# Patient Record
Sex: Male | Born: 1970
Health system: Southern US, Community
[De-identification: ages and names within clinical notes are randomized; demographics above are authoritative.]

## PROBLEM LIST (undated history)

## (undated) DIAGNOSIS — Z87440 Personal history of urinary (tract) infections: Secondary | ICD-10-CM

## (undated) DIAGNOSIS — D649 Anemia, unspecified: Secondary | ICD-10-CM

## (undated) DIAGNOSIS — I82409 Acute embolism and thrombosis of unspecified deep veins of unspecified lower extremity: Secondary | ICD-10-CM

## (undated) DIAGNOSIS — G8220 Paraplegia, unspecified: Secondary | ICD-10-CM

## (undated) DIAGNOSIS — G8250 Quadriplegia, unspecified: Secondary | ICD-10-CM

## (undated) DIAGNOSIS — N529 Male erectile dysfunction, unspecified: Secondary | ICD-10-CM

## (undated) DIAGNOSIS — R310 Gross hematuria: Secondary | ICD-10-CM

## (undated) DIAGNOSIS — Z87828 Personal history of other (healed) physical injury and trauma: Secondary | ICD-10-CM

## (undated) HISTORY — DX: Male erectile dysfunction, unspecified: N52.9

## (undated) HISTORY — DX: Anemia, unspecified: D64.9

## (undated) HISTORY — DX: Personal history of urinary (tract) infections: Z87.440

## (undated) HISTORY — DX: Paraplegia, unspecified: G82.20

## (undated) HISTORY — DX: Acute embolism and thrombosis of unspecified deep veins of unspecified lower extremity: I82.409

---
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
## 1997-12-05 ENCOUNTER — Encounter: Admission: RE | Admit: 1997-12-05 | Discharge: 1997-12-05 | Payer: Self-pay | Admitting: Sports Medicine

## 1999-01-23 ENCOUNTER — Encounter: Admission: RE | Admit: 1999-01-23 | Discharge: 1999-01-23 | Payer: Self-pay | Admitting: Family Medicine

## 1999-02-08 ENCOUNTER — Encounter: Admission: RE | Admit: 1999-02-08 | Discharge: 1999-02-08 | Payer: Self-pay | Admitting: Family Medicine

## 2000-09-22 ENCOUNTER — Encounter: Admission: RE | Admit: 2000-09-22 | Discharge: 2000-09-22 | Payer: Self-pay | Admitting: Family Medicine

## 2001-02-19 ENCOUNTER — Encounter: Admission: RE | Admit: 2001-02-19 | Discharge: 2001-02-19 | Payer: Self-pay | Admitting: Family Medicine

## 2002-01-31 ENCOUNTER — Encounter: Admission: RE | Admit: 2002-01-31 | Discharge: 2002-01-31 | Payer: Self-pay | Admitting: Urology

## 2002-01-31 ENCOUNTER — Encounter: Payer: Self-pay | Admitting: Urology

## 2003-04-19 ENCOUNTER — Encounter: Admission: RE | Admit: 2003-04-19 | Discharge: 2003-04-19 | Payer: Self-pay | Admitting: Family Medicine

## 2004-06-03 ENCOUNTER — Ambulatory Visit: Payer: Self-pay | Admitting: Family Medicine

## 2005-06-30 ENCOUNTER — Ambulatory Visit: Payer: Self-pay | Admitting: Family Medicine

## 2005-07-07 LAB — CONVERTED CEMR LAB
HDL: 52 mg/dL
HDL: 52 mg/dL
HDL: 52 mg/dL
HDL: 52 mg/dL
HDL: 52 mg/dL
LDL Cholesterol: 130 mg/dL
LDL Cholesterol: 130 mg/dL
LDL Cholesterol: 130 mg/dL
LDL Cholesterol: 130 mg/dL
LDL Cholesterol: 130 mg/dL

## 2006-08-19 ENCOUNTER — Ambulatory Visit: Payer: Self-pay | Admitting: Sports Medicine

## 2006-09-15 ENCOUNTER — Telehealth (INDEPENDENT_AMBULATORY_CARE_PROVIDER_SITE_OTHER): Payer: Self-pay | Admitting: *Deleted

## 2007-07-15 ENCOUNTER — Telehealth (INDEPENDENT_AMBULATORY_CARE_PROVIDER_SITE_OTHER): Payer: Self-pay | Admitting: *Deleted

## 2007-07-29 ENCOUNTER — Ambulatory Visit: Payer: Self-pay | Admitting: Family Medicine

## 2007-07-29 DIAGNOSIS — S129XXA Fracture of neck, unspecified, initial encounter: Secondary | ICD-10-CM

## 2007-07-29 DIAGNOSIS — R319 Hematuria, unspecified: Secondary | ICD-10-CM

## 2009-05-08 ENCOUNTER — Encounter: Payer: Self-pay | Admitting: Family Medicine

## 2009-05-10 ENCOUNTER — Ambulatory Visit: Payer: Self-pay | Admitting: Family Medicine

## 2009-05-10 DIAGNOSIS — R532 Functional quadriplegia: Secondary | ICD-10-CM

## 2009-05-18 ENCOUNTER — Encounter: Payer: Self-pay | Admitting: Family Medicine

## 2010-04-09 NOTE — Consult Note (Signed)
Summary: [facility] (05/2008 and 10/2006)  [facility]   Imported By: Bradly Bienenstock 05/22/2009 14:31:50  _____________________________________________________________________  External Attachment:    Type:   Image     Comment:   External Document

## 2010-04-09 NOTE — Assessment & Plan Note (Signed)
Summary: meet new doc.   Vital Signs:  Patient profile:   40 year old male Weight:      169.9 pounds Pulse rate:   80 / minute BP sitting:   125 / 73  Vitals Entered By: Arlyss Repress CMA, (May 10, 2009 10:32 AM) CC: needs handicap placard. Is Patient Diabetic? No Pain Assessment Patient in pain? no        Primary Care Provider:  Eustaquio Boyden  MD  CC:  needs handicap placard..  History of Present Illness: CC: meet new doc.  h/o neck fracture 1988 after fall down stairs, since then ambulates with crutches.  has residual quadriparesis (greater below  ~T8).  o/w no complaints.  sees Dr. McDiarmid Urology and currently on macrobid daily for prophylaxis.  to sign ROI for records.  Habits & Providers  Alcohol-Tobacco-Diet     Alcohol drinks/day: 0     Tobacco Status: quit > 6 months  Exercise-Depression-Behavior     Drug Use: never     Seat Belt Use: always  Current Medications (verified): 1)  Baclofen 10 Mg Tabs (Baclofen) .... Take 1 Tablet By Mouth Three Times A Day 2)  Nitrofurantoin Macrocrystal 50 Mg Caps (Nitrofurantoin Macrocrystal) .... Two Daily For Prevention of Kidney Infections  Allergies (verified): 1)  ! Pcn  Past History:  Past Surgical History: none  Family History: MOM had him at 54 father 35. Has 1 brother 1 sister no sig. family hx. of disease No CA, MI, CVA  Social History: Smoking Status:  quit > 6 months Drug Use:  never Seat Belt Use:  always  Physical Exam  General:  Well-developed,well-nourished,in no acute distress; alert,appropriate and cooperative throughout examination Lungs:  Normal respiratory effort, chest expands symmetrically. Lungs are clear to auscultation, no crackles or wheezes. Heart:  Normal rate and regular rhythm. S1 and S2 normal without gallop, murmur, click, rub or other extra sounds.   Impression & Recommendations:  Problem # 1:  Hx of VERTEBRAL FRACTURE, CERVICAL SPINE (ICD-805.00)  refilled  baclofen.  doing well on this.  able to ambulate with crutches.  Orders: [facility]- Est Level  3 (16109)  Problem # 2:  HEMATURIA (ICD-599.70) obtain records from urology.  His updated medication list for this problem includes:    Nitrofurantoin Macrocrystal 50 Mg Caps (Nitrofurantoin macrocrystal) .Marland Kitchen..Marland Kitchen Two daily for prevention of kidney infections  Problem # 3:  Preventive Health Care (ICD-V70.0) needs tetanus 2012.  needs FLP possibly 2012 (last 2007 with LDL 130)  Complete Medication List: 1)  Baclofen 10 Mg Tabs (Baclofen) .... Take 1 tablet by mouth three times a day 2)  Nitrofurantoin Macrocrystal 50 Mg Caps (Nitrofurantoin macrocrystal) .... Two daily for prevention of kidney infections  Patient Instructions: 1)  Pleasure to meet you today. 2)  Please return as needed. 3)  I have refilled your baclofen for 1 more year.  4)  I have filled out your application for handicap placard. 5)  Call clinic with questions. Prescriptions: BACLOFEN 10 MG TABS (BACLOFEN) Take 1 tablet by mouth three times a day  #90 x 11   Entered and Authorized by:   Eustaquio Boyden  MD   Signed by:   Eustaquio Boyden  MD on 05/10/2009   Method used:   Electronically to        [facility] (retail)       [address]       [place], [place]  [id number]  Ph: [phone]       Fax: [phone]   RxID:   [id number]    Prevention & Chronic Care Immunizations   Influenza vaccine: Not documented    Tetanus booster: 02/07/2001: Done.   Tetanus booster due: 02/08/2011    Pneumococcal vaccine: Not documented  Other Screening   Smoking status: quit > 6 months  (05/10/2009)  Lipids   Total Cholesterol: Not documented   LDL: 130  (07/07/2005)   LDL Direct: Not documented   HDL: 52  (07/07/2005)   Triglycerides: Not documented

## 2010-04-09 NOTE — Miscellaneous (Signed)
Summary: Urology update  Clinical Lists Changes  Medications: Changed medication from NITROFURANTOIN MACROCRYSTAL 50 MG CAPS (NITROFURANTOIN MACROCRYSTAL) two daily for prevention of kidney infections to NITROFURANTOIN MACROCRYSTAL 100 MG CAPS (NITROFURANTOIN MACROCRYSTAL) take one daily for prevention of UTIs Observations: Added new observation of UROLOGY MD: MacDiarmid (05/18/2009 11:49) Added new observation of PAST MED HX: cervical fracture/fusion C4-6 after accident with residual paraparesis recurrent UTIs (on ppx macrodantin) with neurogenic bladder Erectile Dysfunction, organic (05/18/2009 11:49)      Habits & Providers     Urologist: MacDiarmid   Past History:  Past Medical History: cervical fracture/fusion C4-6 after accident with residual paraparesis recurrent UTIs (on ppx macrodantin) with neurogenic bladder Erectile Dysfunction, organic

## 2010-04-15 ENCOUNTER — Encounter: Payer: Self-pay | Admitting: *Deleted

## 2010-05-10 ENCOUNTER — Encounter: Payer: Self-pay | Admitting: Family Medicine

## 2010-05-10 ENCOUNTER — Ambulatory Visit (INDEPENDENT_AMBULATORY_CARE_PROVIDER_SITE_OTHER): Payer: Medicaid Other | Admitting: Family Medicine

## 2010-05-10 VITALS — BP 115/70 | Temp 98.4°F

## 2010-05-10 DIAGNOSIS — M79609 Pain in unspecified limb: Secondary | ICD-10-CM

## 2010-05-10 DIAGNOSIS — Z79899 Other long term (current) drug therapy: Secondary | ICD-10-CM

## 2010-05-10 DIAGNOSIS — M79604 Pain in right leg: Secondary | ICD-10-CM

## 2010-05-10 DIAGNOSIS — Z87440 Personal history of urinary (tract) infections: Secondary | ICD-10-CM

## 2010-05-10 MED ORDER — MELOXICAM 15 MG PO TABS: 15.0000 mg | ORAL_TABLET | Freq: Every day | ORAL | Status: AC

## 2010-05-10 MED ORDER — BACLOFEN 10 MG PO TABS: 10.0000 mg | ORAL_TABLET | Freq: Three times a day (TID) | ORAL | Status: DC

## 2010-05-10 NOTE — Patient Instructions (Addendum)
It was nice meeting you today.  Your repeat Blood pressure was 115/70 which is a good healthy number.  I have sent your prescription for baclofen to [facility].  For your pain I have sent a medication that is similar to ibuprofen to your pharmacy, take this with food.  If you start to have upset stomach with this let me know.  Do not take ibuprofen (advil, motrin) or aleve with this medicine.  Follow up with Dr. Perley Jain in urology to have your macrodantin refilled since he has been filling this.  If you have questions please feel free to call our office.

## 2010-05-19 ENCOUNTER — Encounter: Payer: Self-pay | Admitting: Family Medicine

## 2010-05-19 DIAGNOSIS — Z87440 Personal history of urinary (tract) infections: Secondary | ICD-10-CM | POA: Insufficient documentation

## 2010-05-19 NOTE — Assessment & Plan Note (Addendum)
Has been using ibuprofen, would like a medication similar that he does not have to take often.  WIll Rx meloxicam for now to see if this improves.  No chemistry on file, will obtain to check Cr since has been using nsaid

## 2010-05-19 NOTE — Assessment & Plan Note (Signed)
Doing well, will refill baclofen for spasm control/relief

## 2010-05-19 NOTE — Progress Notes (Signed)
  Subjective:    Patient ID: Andrew Sullivan, male    DOB: 07-16-1970, 40 y.o.   MRN: [id number]  HPI Andrew Sullivan is a new patient to me who comes in for medication refills.  He states since his last visit he has had a an increase in the pain in his legs.  He is a paraplegic but does have feeling in his legs bilaterally. He states the pain is a dull ache that comes and goes down his entire leg.  He has been taking Ibuprofen for the pain and that has been helping.   Denies pain feeling like electric shock, denies any lower back pain, increased difficulty with urination or bowel movements.  Wants something similar to ibuprofen that he does not have to take as often.   Review of Systems Denies n/v, sob, cp, headache, pain in other joints or muscles, swelling of extremities.       Objective:   Physical Exam  Vitals reviewed. Constitutional: He appears well-developed and well-nourished.  HENT:  Head: Normocephalic and atraumatic.  Eyes: Pupils are equal, round, and reactive to light.  Neck: Neck supple.  Cardiovascular: Normal rate and regular rhythm.   Pulmonary/Chest: Effort normal and breath sounds normal.  Abdominal: Soft. Bowel sounds are normal. He exhibits no distension. There is no tenderness.  Musculoskeletal:       Right upper leg: He exhibits no tenderness, no swelling and no edema.       Left upper leg: He exhibits no tenderness, no swelling and no edema.       Right lower leg: He exhibits no tenderness, no swelling and no edema.       Left lower leg: He exhibits no tenderness, no swelling and no edema.       Paraplegia  Lymphadenopathy:    He has no cervical adenopathy.  Skin: Skin is warm and dry.

## 2010-05-19 NOTE — Assessment & Plan Note (Signed)
On daily macrobid, rx from dr mcdiarmid in urology.  Pt. States he has appt. With him within the next couple of weeks, will defer to him to continue to refill macrobid.

## 2010-05-20 ENCOUNTER — Encounter: Payer: Self-pay | Admitting: Family Medicine

## 2010-06-03 ENCOUNTER — Telehealth: Payer: Self-pay | Admitting: *Deleted

## 2010-06-03 MED ORDER — BACLOFEN 10 MG PO TABS: 10.0000 mg | ORAL_TABLET | Freq: Three times a day (TID) | ORAL | Status: DC

## 2010-06-03 NOTE — Telephone Encounter (Signed)
Resent Rx for #90 w/ 6 refills

## 2010-06-03 NOTE — Telephone Encounter (Signed)
Received call from pharmacy stating they received Rx for baclofen 10 mg with directions to take one three times a day. However RX was for only #30 tabs. Advised pharmacy may give patient # 90 tabs, will send message to Dr. Ashley Royalty.

## 2010-06-05 ENCOUNTER — Encounter: Payer: Self-pay | Admitting: Family Medicine

## 2010-06-05 ENCOUNTER — Ambulatory Visit (INDEPENDENT_AMBULATORY_CARE_PROVIDER_SITE_OTHER): Payer: Medicaid Other | Admitting: Family Medicine

## 2010-06-05 VITALS — BP 137/72 | HR 87 | Temp 98.2°F | Ht 75.0 in | Wt 161.0 lb

## 2010-06-05 DIAGNOSIS — L259 Unspecified contact dermatitis, unspecified cause: Secondary | ICD-10-CM

## 2010-06-05 DIAGNOSIS — L249 Irritant contact dermatitis, unspecified cause: Secondary | ICD-10-CM

## 2010-06-05 NOTE — Patient Instructions (Signed)
Thank you for coming in today. Avoid that cleaner again.  Wear gloves with other cleaners. Try Vaseline cream to hands for dry skin.

## 2010-06-06 DIAGNOSIS — L249 Irritant contact dermatitis, unspecified cause: Secondary | ICD-10-CM | POA: Insufficient documentation

## 2010-06-06 NOTE — Progress Notes (Signed)
Andrew Sullivan presents to clinic with skin irritation since using "fabuloso" multi-surface cleaner. He used the cleaner with un-gloved hands a few times a few weeks ago. It never hurt. However his hands became irritated the skin is now flaking off. His underlying skin is no longer irritated. He never has had issues like this in the past. No other cleaning product has caused this. No trouble breathing no swelling. Didn't use any creams or ointments on his hands afterwards.   ROS: Well as above.  Exam:  Vs noted.  Gen: Well NAD HEENT: EOMI, PERRL, MMM Lungs: CTABL Nl WOB Heart: RRR no MRG Skin: Scaling of the skin on the dorsal and plantar aspect of both of his hands. No underlying skin erythremia. No pustules or papules.

## 2010-06-06 NOTE — Assessment & Plan Note (Signed)
Unable to get full list of ingredients of this produce. Material safety Data Sheet is not very revealing.  Think this is very likely to be a simple irritant dermatitis.  Plan: Avoid this produce and wear gloves with cleaners. Advised moisturisers. See pt instructions.

## 2010-09-06 ENCOUNTER — Encounter: Payer: Self-pay | Admitting: Family Medicine

## 2010-09-06 ENCOUNTER — Ambulatory Visit (INDEPENDENT_AMBULATORY_CARE_PROVIDER_SITE_OTHER): Payer: Medicaid Other | Admitting: Family Medicine

## 2010-09-06 VITALS — BP 129/76 | HR 80 | Temp 98.7°F | Wt 162.0 lb

## 2010-09-06 DIAGNOSIS — L02412 Cutaneous abscess of left axilla: Secondary | ICD-10-CM | POA: Insufficient documentation

## 2010-09-06 DIAGNOSIS — IMO0002 Reserved for concepts with insufficient information to code with codable children: Secondary | ICD-10-CM

## 2010-09-06 MED ORDER — SULFAMETHOXAZOLE-TRIMETHOPRIM 800-160 MG PO TABS: 2.0000 | ORAL_TABLET | Freq: Two times a day (BID) | ORAL | Status: DC

## 2010-09-06 NOTE — Patient Instructions (Addendum)
1. The bump on your under arm is probably an infection and may be caused by a type of bacteria called MRSA.  A. To prevent any complications from this bacteria we prescribed you antibiotics   B. Take the bactrim (trimethoprim-sulfamethoxazole) 2 tablets twice daily for 2 weeks.  Finish the whole bottle- do not stop taking them even if you feel better- finish it all.  C. Drink lots of water.  D. Call if the bump gets worse, you get a fever, or other concerning symptoms.   E. Schedule a follow up appointment with Luretha Murphy, FNP or Dr. Ashley Royalty in two weeks  F. Continue to wash your skin well, with soap and water, especially if you get a cut.

## 2010-09-06 NOTE — Progress Notes (Signed)
  Subjective:    Patient ID: Andrew Sullivan, male    DOB: [birth year], 40 y.o.   MRN: [id number]  HPI 40 year old man p/w a enlarged area under left arm, just adjacent to axilla.  First noticed on June 18th, it was dime-sized, and it grew over the course of 2 days to current size.  Never opened, never drained, no associated rash.  No other swollen areas.  No fevers, chills, night sweats, unintentional weigh changes, change in appetite or activity, or fatigue/malaise.  Never had anything like this before.  Pt uses crutches to ambulate- reports these do not rub against this area when ambulating, but put some pressure against it when standing with crutches.  No knowledge of cutting or tearing skin in that area, or any insect bites there recently- and no headaches, myalgias (has baseline mylagias in bilat LE-- no increase from baseline), or arthralgias.  No trauma or injury to that area either.      Review of Systems  Constitutional:       Per HPI  Musculoskeletal:       Per HPI  Skin: Positive for wound.       Swollen area under Left arm, adjacent to axilla--Per HPI  Hematological: Negative for adenopathy.       Objective:   Physical Exam  Vitals reviewed. Constitutional: He is oriented to person, place, and time. He appears well-developed and well-nourished. No distress.  HENT:  Head: Normocephalic and atraumatic.  Neck: Normal range of motion. Neck supple. No thyromegaly present.  Lymphadenopathy:       Head (right side): No submental, no submandibular, no tonsillar, no preauricular, no posterior auricular and no occipital adenopathy present.       Head (left side): No submental, no submandibular, no tonsillar, no preauricular, no posterior auricular and no occipital adenopathy present.    He has no cervical adenopathy.    He has no axillary adenopathy.       Right: No supraclavicular and no epitrochlear adenopathy present.       Left: No supraclavicular and no epitrochlear adenopathy  present.  Neurological: He is alert and oriented to person, place, and time.  Skin: Skin is warm and dry. No rash noted. He is not diaphoretic. No pallor.       Left upper arm, inner aspect, immediately adjacent to axilla: 3cm X 1.5cm loculated abscess.  Center: erythematous and slightly (superficially) fluctuant.  Base and sides are indurated.  Tender with firm pressure.  Not fixed.  Skin around base of abscess peeling back.           Assessment & Plan:

## 2010-09-06 NOTE — Assessment & Plan Note (Signed)
1. Left upper extremity/axillary abscess: acute, uncomplicated, suspect CA-MRSA.  A. Prescribed Bactrim DS two tabs po BID X 14 days (disp #28, no refills).  Instructed pt to finish entire course of antibiotics regardless of improvement in symptoms.   B. Instructed pt to call if the abscess worsens, he develops a fever, or any other concerning symptoms.   C. Instructed pt to schedule follow-up appt with Luretha Murphy, FNP or Dr. Ashley Royalty in 2 weeks.

## 2010-09-10 ENCOUNTER — Telehealth: Payer: Self-pay | Admitting: Family Medicine

## 2010-09-10 ENCOUNTER — Other Ambulatory Visit: Payer: Self-pay | Admitting: Family Medicine

## 2010-09-10 DIAGNOSIS — L02412 Cutaneous abscess of left axilla: Secondary | ICD-10-CM

## 2010-09-10 MED ORDER — DOXYCYCLINE HYCLATE 100 MG PO CAPS: ORAL_CAPSULE | ORAL | Status: DC

## 2010-09-10 NOTE — Telephone Encounter (Signed)
Patient took first dose of the Septra Friday evening and second dose Saturday am.  By the second dose he began experiencing dizziness and headache and sx have not gone away.  Spoke with Dr. McDiarmid who advised that patient stop the Septra.  He prescribed Doxy.  Called patient and informed him.

## 2010-09-10 NOTE — Telephone Encounter (Signed)
Andrew Sullivan is making weak/dizzy/headaches - needs to talk to nurse

## 2010-09-20 ENCOUNTER — Ambulatory Visit (INDEPENDENT_AMBULATORY_CARE_PROVIDER_SITE_OTHER): Payer: Medicaid Other | Admitting: Family Medicine

## 2010-09-20 ENCOUNTER — Encounter: Payer: Self-pay | Admitting: Family Medicine

## 2010-09-20 VITALS — BP 150/73 | HR 67 | Temp 98.9°F | Wt 166.0 lb

## 2010-09-20 DIAGNOSIS — IMO0002 Reserved for concepts with insufficient information to code with codable children: Secondary | ICD-10-CM

## 2010-09-20 DIAGNOSIS — L02412 Cutaneous abscess of left axilla: Secondary | ICD-10-CM

## 2010-09-20 NOTE — Progress Notes (Signed)
  Subjective:    Patient ID: Andrew Sullivan, male    DOB: 07-07-1970, 40 y.o.   MRN: [id number]  HPI Here for follow up of abscess on L upper arm.  He feels area is greatly improved.  Was taking septra initially but had nausea and dizziness with that.  Switched to doxycycline on 09/10/10.  Still finishing up a 14 day course, tolerating well.  Denies pain, fever, chills, nausea, vomiting, other areas that are swollen.   Review of Systems See above    Objective:   Physical Exam  Constitutional: He appears well-developed and well-nourished. No distress.  Skin: Skin is warm and dry.       Area on upper upper L arm near axilla with slight induration.  No fluctuance, erythema, pain, drainage.           Assessment & Plan:

## 2010-09-20 NOTE — Assessment & Plan Note (Signed)
Abscess improved, do not think there is a drainable pocket at this point.  Instructed to complete course of doxycycline.  Return if worsening.  Given red flags.

## 2010-12-23 ENCOUNTER — Other Ambulatory Visit: Payer: Self-pay | Admitting: Family Medicine

## 2010-12-23 NOTE — Telephone Encounter (Signed)
Refill request

## 2011-07-28 ENCOUNTER — Telehealth: Payer: Self-pay | Admitting: Family Medicine

## 2011-07-28 NOTE — Telephone Encounter (Signed)
Patient has scheduled an appt to see Dr. Ashley Royalty, but needs a refill on his Baclofen sen to [facility] on [facility] as he does not have any left.

## 2011-07-29 ENCOUNTER — Other Ambulatory Visit: Payer: Self-pay | Admitting: Family Medicine

## 2011-07-29 MED ORDER — BACLOFEN 10 MG PO TABS: 10.0000 mg | ORAL_TABLET | Freq: Three times a day (TID) | ORAL | Status: DC

## 2011-07-29 NOTE — Telephone Encounter (Signed)
Rx was sent by Dr. Ashley Royalty but came up on our end as an error because it was sent to [facility]. I called [facility] on [address]. and verbally gave the Rx for baclofen. Patient notified.

## 2011-07-29 NOTE — Telephone Encounter (Signed)
Patient is calling back about getting some medication before his appt,

## 2011-08-06 ENCOUNTER — Ambulatory Visit: Payer: Medicaid Other | Admitting: Family Medicine

## 2011-08-11 ENCOUNTER — Encounter (HOSPITAL_COMMUNITY): Payer: Self-pay | Admitting: *Deleted

## 2011-08-11 ENCOUNTER — Inpatient Hospital Stay (HOSPITAL_COMMUNITY)
Admission: EM | Admit: 2011-08-11 | Discharge: 2011-08-15 | DRG: 470 | Disposition: A | Discharge status: Skilled Nursing Facility | Payer: Medicare Other | Attending: Orthopedic Surgery | Admitting: Orthopedic Surgery

## 2011-08-11 ENCOUNTER — Emergency Department (HOSPITAL_COMMUNITY): Payer: Medicare Other

## 2011-08-11 DIAGNOSIS — IMO0002 Reserved for concepts with insufficient information to code with codable children: Secondary | ICD-10-CM

## 2011-08-11 DIAGNOSIS — W010XXA Fall on same level from slipping, tripping and stumbling without subsequent striking against object, initial encounter: Secondary | ICD-10-CM | POA: Diagnosis present

## 2011-08-11 DIAGNOSIS — Y92009 Unspecified place in unspecified non-institutional (private) residence as the place of occurrence of the external cause: Secondary | ICD-10-CM

## 2011-08-11 DIAGNOSIS — S72002A Fracture of unspecified part of neck of left femur, initial encounter for closed fracture: Secondary | ICD-10-CM

## 2011-08-11 DIAGNOSIS — G8190 Hemiplegia, unspecified affecting unspecified side: Secondary | ICD-10-CM | POA: Diagnosis present

## 2011-08-11 DIAGNOSIS — S7290XA Unspecified fracture of unspecified femur, initial encounter for closed fracture: Secondary | ICD-10-CM

## 2011-08-11 DIAGNOSIS — W19XXXS Unspecified fall, sequela: Secondary | ICD-10-CM

## 2011-08-11 DIAGNOSIS — Z981 Arthrodesis status: Secondary | ICD-10-CM

## 2011-08-11 DIAGNOSIS — S72033A Displaced midcervical fracture of unspecified femur, initial encounter for closed fracture: Principal | ICD-10-CM | POA: Diagnosis present

## 2011-08-11 LAB — CBC
Hemoglobin: 12.7 g/dL — ABNORMAL LOW (ref 13.0–17.0)
Platelets: 172 10*3/uL (ref 150–400)
RBC: 4.17 MIL/uL — ABNORMAL LOW (ref 4.22–5.81)
WBC: 3.5 10*3/uL — ABNORMAL LOW (ref 4.0–10.5)

## 2011-08-11 LAB — BASIC METABOLIC PANEL
BUN: 24 mg/dL — ABNORMAL HIGH (ref 6–23)
CO2: 28 mEq/L (ref 19–32)
Chloride: 102 mEq/L (ref 96–112)
GFR calc non Af Amer: >90 mL/min (ref >90)
Glucose, Bld: 95 mg/dL (ref 70–99)
Potassium: 4.2 mEq/L (ref 3.5–5.1)
Sodium: 139 mEq/L (ref 135–145)

## 2011-08-11 LAB — TYPE AND SCREEN
ABO/RH(D): O POS
Antibody Screen: NEGATIVE

## 2011-08-11 LAB — DIFFERENTIAL
Lymphocytes Relative: 20 % (ref 12–46)
Lymphs Abs: 0.7 10*3/uL (ref 0.7–4.0)
Monocytes Relative: 13 % — ABNORMAL HIGH (ref 3–12)
Neutro Abs: 2.2 10*3/uL (ref 1.7–7.7)
Neutrophils Relative %: 62 % (ref 43–77)

## 2011-08-11 LAB — APTT: aPTT: 37 seconds (ref 24–37)

## 2011-08-11 LAB — PROTIME-INR: INR: 1.1 (ref 0.00–1.49)

## 2011-08-11 MED ORDER — ENOXAPARIN SODIUM 40 MG/0.4ML ~~LOC~~ SOLN
40.0000 mg | Freq: Once | SUBCUTANEOUS | Status: AC
  Administered 2011-08-11: 40 mg via SUBCUTANEOUS
  Filled 2011-08-11: qty 0.4

## 2011-08-11 MED ORDER — DOCUSATE SODIUM 100 MG PO CAPS
100.0000 mg | ORAL_CAPSULE | Freq: Every day | ORAL | Status: DC
  Administered 2011-08-12: 100 mg via ORAL

## 2011-08-11 MED ORDER — HYDROCODONE-ACETAMINOPHEN 5-325 MG PO TABS: 1.0000 | ORAL_TABLET | ORAL | Status: DC | PRN

## 2011-08-11 MED ORDER — TEMAZEPAM 15 MG PO CAPS: 15.0000 mg | ORAL_CAPSULE | Freq: Every evening | ORAL | Status: DC | PRN

## 2011-08-11 MED ORDER — BACLOFEN 10 MG PO TABS
10.0000 mg | ORAL_TABLET | Freq: Two times a day (BID) | ORAL | Status: DC
  Administered 2011-08-12: 10 mg via ORAL
  Filled 2011-08-11 (×3): qty 1

## 2011-08-11 MED ORDER — MORPHINE SULFATE 2 MG/ML IJ SOLN: 2.0000 mg | INTRAMUSCULAR | Status: DC | PRN

## 2011-08-11 MED ORDER — VANCOMYCIN HCL IN DEXTROSE 1-5 GM/200ML-% IV SOLN
1000.0000 mg | INTRAVENOUS | Status: AC
  Administered 2011-08-12: 1000 mg via INTRAVENOUS

## 2011-08-11 MED ORDER — SODIUM CHLORIDE 0.9 % IV SOLN
INTRAVENOUS | Status: DC
  Administered 2011-08-12: 01:00:00 via INTRAVENOUS

## 2011-08-11 MED ORDER — CHLORHEXIDINE GLUCONATE 4 % EX LIQD
60.0000 mL | Freq: Once | CUTANEOUS | Status: DC
  Filled 2011-08-11: qty 60

## 2011-08-11 NOTE — Consult Note (Signed)
Reason for Consult: left hip fracture Referring Physician: Loren Racer, MD     Andrew Sullivan is an 41 y.o. male.  HPI: 41 year old male who was ambulating with use of crutches last Wednesday when he lost his footing and fell. No LOC, dizziness or chest pain at time of fall . States he fell onto a medicine bottle that was in his pocket. Patient with history of fall downstairs at age 81 that left him paraparesis of both legs. Patient denies any bowel or bladder incontience. He has been in bed since the fall trying to let things heal.  Past Medical History  Diagnosis Date  . Cervical spine fracture     C4-C6 with fusion  . Paraparesis of both lower limbs   . History of recurrent UTIs   . Erectile dysfunction     Past Surgical History  Procedure Date  . Cervical fusion     No family history on file.  Social History:  reports that he has quit smoking. He has never used smokeless tobacco. He reports that he does not drink alcohol or use illicit drugs.  Allergies:  Allergies  Allergen Reactions  . Penicillins     REACTION: itching and rash    Medications: I have reviewed the patient's current medications. Baclofen 10mg  BID  Mobic 15 mg daily  Results for orders placed during the hospital encounter of 08/11/11 (from the past 48 hour(s))  CBC     Status: Abnormal   Collection Time   08/11/11  5:33 PM      Component Value Range Comment   WBC 3.5 (*) 4.0 - 10.5 (K/uL)    RBC 4.17 (*) 4.22 - 5.81 (MIL/uL)    Hemoglobin 12.7 (*) 13.0 - 17.0 (g/dL)    HCT 16.1 (*) 09.6 - 52.0 (%)    MCV 90.9  78.0 - 100.0 (fL)    MCH 30.5  26.0 - 34.0 (pg)    MCHC 33.5  30.0 - 36.0 (g/dL)    RDW 04.5  40.9 - 81.1 (%)    Platelets 172  150 - 400 (K/uL)   DIFFERENTIAL     Status: Abnormal   Collection Time   08/11/11  5:33 PM      Component Value Range Comment   Neutrophils Relative 62  43 - 77 (%)    Neutro Abs 2.2  1.7 - 7.7 (K/uL)    Lymphocytes Relative 20  12 - 46 (%)    Lymphs Abs  0.7  0.7 - 4.0 (K/uL)    Monocytes Relative 13 (*) 3 - 12 (%)    Monocytes Absolute 0.5  0.1 - 1.0 (K/uL)    Eosinophils Relative 5  0 - 5 (%)    Eosinophils Absolute 0.2  0.0 - 0.7 (K/uL)    Basophils Relative 0  0 - 1 (%)    Basophils Absolute 0.0  0.0 - 0.1 (K/uL)   BASIC METABOLIC PANEL     Status: Abnormal   Collection Time   08/11/11  5:33 PM      Component Value Range Comment   Sodium 139  135 - 145 (mEq/L)    Potassium 4.2  3.5 - 5.1 (mEq/L)    Chloride 102  96 - 112 (mEq/L)    CO2 28  19 - 32 (mEq/L)    Glucose, Bld 95  70 - 99 (mg/dL)    BUN 24 (*) 6 - 23 (mg/dL)    Creatinine, Ser 9.14  0.50 - 1.35 (  mg/dL)    Calcium 9.2  8.4 - 10.5 (mg/dL)    GFR calc non Af Amer >90  >90 (mL/min)    GFR calc Af Amer >90  >90 (mL/min)   PROTIME-INR     Status: Normal   Collection Time   08/11/11  5:33 PM      Component Value Range Comment   Prothrombin Time 14.4  11.6 - 15.2 (seconds)    INR 1.10  0.00 - 1.49    APTT     Status: Normal   Collection Time   08/11/11  5:33 PM      Component Value Range Comment   aPTT 37  24 - 37 (seconds)     Dg Hip Complete Left  08/11/2011  *RADIOLOGY REPORT*  Clinical Data: Pain post fall  LEFT HIP - COMPLETE 2+ VIEW  Comparison: 06/11/2010  Findings: There is a left impacted mid-cervical femur fracture, minimally comminuted.  No dislocation.  Pelvis appears intact. Bilateral pelvic phleboliths.  IMPRESSION:  1.  Impacted minimally comminuted mid-cervical fracture, left femur.  Original Report Authenticated By: Osa Craver, M.D.    Review of Systems  Constitutional: Negative.   Eyes: Negative.   Respiratory: Negative.   Cardiovascular: Negative.   Gastrointestinal: Negative.   Genitourinary: Negative.   Skin: Negative.   Neurological: Negative for sensory change, speech change, seizures and loss of consciousness.  Endo/Heme/Allergies: Negative.   Psychiatric/Behavioral: Negative.  Negative for substance abuse.   Blood pressure 126/51,  pulse 81, temperature 98.4 F (36.9 C), temperature source Oral, resp. rate 18, SpO2 98.00%. Physical Exam  Constitutional: He is oriented to person, place, and time. He appears well-developed and well-nourished.  HENT:  Head: Normocephalic and atraumatic.  Eyes: EOM are normal.  Cardiovascular: Normal rate, regular rhythm and intact distal pulses.  Exam reveals no gallop and no friction rub.   No murmur heard. Respiratory: Effort normal and breath sounds normal. No respiratory distress. He has no wheezes. He has no rales.  GI: Soft. Bowel sounds are normal. He exhibits no distension.  Musculoskeletal:       Bilateral feet, ankles lower legs non tender to palpation. Gentle ROM right hip no pain any ROM left hip causes pain slight shortening of left leg.   Neurological: He is alert and oriented to person, place, and time.       Loss of motor function bilateral lower extremities. Normal sensation bilateral lower to light touch  Skin: Skin is warm and dry.  Psychiatric: He has a normal mood and affect. His behavior is normal.    Assessment/Plan: Left impacted mid-cervical femur fracture, minimally comminuted.  No dislocation.  History of cervical fracture C4-C6 with fusion Paraparesis of lower extremities Plan to transfer patient to [place] for Dr. Charlann Boxer to perform surgery. Surgery will be ORIF vs screw fixation vs hemiarthroplasty left hip Will obtain US to r/o DVT as patient has been bed bound He will need DVT prophylaxis if [place] is negative  Yahira Timberman W. 08/11/2011, 6:44 PM

## 2011-08-11 NOTE — ED Notes (Signed)
Pt fell on his left hip on Wednesday.  Pt was using crutches and lost balance.  PT denies bruising.  Pt has hip and thigh pain.

## 2011-08-11 NOTE — ED Provider Notes (Signed)
History  This chart was scribed for Loren Racer, MD by Bennett Scrape. This patient was seen in room STRE5/STRE5 and the patient's care was started at 5:12PM.  CSN: [id number]  Arrival date & time 08/11/11  1605   First MD Initiated Contact with Patient 08/11/11 1712      Chief Complaint  Patient presents with  . Hip Pain    left    Patient is a 41 y.o. male presenting with hip pain. The history is provided by the patient. No language interpreter was used.  Hip Pain This is a new problem. The current episode started more than 2 days ago. The problem occurs constantly. The problem has been gradually worsening. Pertinent negatives include no chest pain, no abdominal pain, no headaches and no shortness of breath. The symptoms are aggravated by walking. The symptoms are relieved by rest. He has tried nothing for the symptoms.    Andrew Sullivan is a 41 y.o. male who presents to the Emergency Department complaining of 4 days of sudden onset, gradually worsening, constant left hip pain that started after a fall. Pt states that he was walking with crutches when he lost his balance and landed on his left hip. Pt is a paraplegic described as weakness in all extremities from a neck injury in 1996. He states that he is able to walk with crutches but has not been able to walk since Wednesday. Pt states that his sensation is mostly intact except for with cold water. He denies taking any OTC medications to improve his pain.He denies any other associated symptoms currently. He is a former smoker but denies alcohol use.  Past Medical History  Diagnosis Date  . Cervical spine fracture     C4-C6 with fusion  . Paraparesis of both lower limbs   . History of recurrent UTIs   . Erectile dysfunction     Past Surgical History  Procedure Date  . Cervical fusion     No family history on file.  History  Substance Use Topics  . Smoking status: Former Games developer  . Smokeless tobacco: Never Used  .  Alcohol Use: No      Review of Systems  Respiratory: Negative for shortness of breath.   Cardiovascular: Negative for chest pain.  Gastrointestinal: Negative for abdominal pain.  Musculoskeletal: Negative for back pain.       Left hip pain   Neurological: Positive for weakness (chronic). Negative for headaches.    Allergies  Penicillins  Home Medications   No current outpatient prescriptions on file.  Triage Vitals: BP 126/51  Pulse 81  Temp(Src) 98.4 F (36.9 C) (Oral)  Resp 18  SpO2 98%  Physical Exam  Nursing note and vitals reviewed. Constitutional: He is oriented to person, place, and time. He appears well-developed and well-nourished. No distress.  HENT:  Head: Normocephalic and atraumatic.  Mouth/Throat: Oropharynx is clear and moist.  Eyes: Conjunctivae and EOM are normal.  Neck: Neck supple. No tracheal deviation present.  Cardiovascular: Normal rate and regular rhythm.   Pulmonary/Chest: Effort normal and breath sounds normal. No respiratory distress.  Abdominal: Soft. He exhibits no distension.  Musculoskeletal:       Left leg is slightly shortened, Pt is a paraplegic and has chronic weakness in all extremities, felt more so in the lower extremities   Neurological: He is alert and oriented to person, place, and time.  Skin: Skin is warm and dry.  Psychiatric: He has a normal mood and affect. His behavior  is normal.    ED Course  Procedures (including critical care time)  DIAGNOSTIC STUDIES: Oxygen Saturation is 98% on room air, normal by my interpretation.    COORDINATION OF CARE: 5:22PM-Informed pt of radiology results. Discussed plan to contact orthopedist to determine if surgery is necessary and pt agreed to plan. Pt turned down pain medication.  5:55PM-Discussed pt's care with orthopedist. Called ended at 5:57PM.  Labs Reviewed  CBC - Abnormal; Notable for the following:    WBC 3.5 (*)    RBC 4.17 (*)    Hemoglobin 12.7 (*)    HCT 37.9 (*)     All other components within normal limits  DIFFERENTIAL - Abnormal; Notable for the following:    Monocytes Relative 13 (*)    All other components within normal limits  BASIC METABOLIC PANEL - Abnormal; Notable for the following:    BUN 24 (*)    All other components within normal limits  CBC - Abnormal; Notable for the following:    RBC 4.00 (*)    Hemoglobin 12.0 (*)    HCT 36.2 (*)    All other components within normal limits  BASIC METABOLIC PANEL - Abnormal; Notable for the following:    Glucose, Bld 116 (*)    All other components within normal limits  PROTIME-INR  APTT  TYPE AND SCREEN  ABO/RH  SURGICAL PCR SCREEN  LAB REPORT - SCANNED  CBC   Dg Pelvis Portable  08/12/2011  *RADIOLOGY REPORT*  Clinical Data: Left hip replacement surgery.  PORTABLE PELVIS  Comparison: 08/11/2011.  Findings: The bipolar hip prosthesis is well seated.  No complicating features.  The pubic symphysis and SI joints are intact.  IMPRESSION: Well seated bipolar hip prosthesis.  No complicating features.  Original Report Authenticated By: P. Loralie Champagne, M.D.     1. Femur fracture   2. Other quadriplegia and quadriparesis       MDM  I personally performed the services described in this documentation, which was scribed in my presence. The recorded information has been reviewed and considered.       Loren Racer, MD 08/13/11 [phone]

## 2011-08-11 NOTE — ED Notes (Signed)
Orthopedic MD in to assess pt.

## 2011-08-12 ENCOUNTER — Encounter (HOSPITAL_COMMUNITY)
Admission: EM | Disposition: A | Discharge status: Skilled Nursing Facility | Payer: Self-pay | Source: Home / Self Care | Attending: Orthopedic Surgery

## 2011-08-12 ENCOUNTER — Inpatient Hospital Stay (HOSPITAL_COMMUNITY): Payer: Medicare Other

## 2011-08-12 ENCOUNTER — Encounter (HOSPITAL_COMMUNITY): Payer: Self-pay | Admitting: Anesthesiology

## 2011-08-12 ENCOUNTER — Inpatient Hospital Stay (HOSPITAL_COMMUNITY): Payer: Medicare Other | Admitting: Anesthesiology

## 2011-08-12 HISTORY — PX: HIP ARTHROPLASTY: SHX981

## 2011-08-12 SURGERY — HEMIARTHROPLASTY, HIP, DIRECT ANTERIOR APPROACH, FOR FRACTURE
Anesthesia: General | Site: Hip | Laterality: Left | Wound class: Clean

## 2011-08-12 MED ORDER — ONDANSETRON HCL 4 MG/2ML IJ SOLN: 4.0000 mg | Freq: Four times a day (QID) | INTRAMUSCULAR | Status: DC | PRN

## 2011-08-12 MED ORDER — MELOXICAM 15 MG PO TABS
15.0000 mg | ORAL_TABLET | Freq: Every day | ORAL | Status: DC
  Administered 2011-08-15: 15 mg via ORAL
  Filled 2011-08-12 (×3): qty 1

## 2011-08-12 MED ORDER — FENTANYL CITRATE 0.05 MG/ML IJ SOLN: 25.0000 ug | INTRAMUSCULAR | Status: DC | PRN

## 2011-08-12 MED ORDER — ACETAMINOPHEN 650 MG RE SUPP: 650.0000 mg | Freq: Four times a day (QID) | RECTAL | Status: DC | PRN

## 2011-08-12 MED ORDER — LACTATED RINGERS IV SOLN
INTRAVENOUS | Status: DC
  Administered 2011-08-12 (×2): via INTRAVENOUS

## 2011-08-12 MED ORDER — POLYETHYLENE GLYCOL 3350 17 G PO PACK: 17.0000 g | PACK | Freq: Two times a day (BID) | ORAL | Status: DC

## 2011-08-12 MED ORDER — DIPHENHYDRAMINE HCL 12.5 MG/5ML PO ELIX: 12.5000 mg | ORAL_SOLUTION | ORAL | Status: DC | PRN

## 2011-08-12 MED ORDER — LACTATED RINGERS IV SOLN: INTRAVENOUS | Status: DC

## 2011-08-12 MED ORDER — PROPOFOL 10 MG/ML IV BOLUS
INTRAVENOUS | Status: DC | PRN
  Administered 2011-08-12: 160 mg via INTRAVENOUS

## 2011-08-12 MED ORDER — METOCLOPRAMIDE HCL 5 MG/ML IJ SOLN: 5.0000 mg | Freq: Three times a day (TID) | INTRAMUSCULAR | Status: DC | PRN

## 2011-08-12 MED ORDER — LIDOCAINE HCL (CARDIAC) 20 MG/ML IV SOLN
INTRAVENOUS | Status: DC | PRN
  Administered 2011-08-12: 50 mg via INTRAVENOUS

## 2011-08-12 MED ORDER — ROCURONIUM BROMIDE 100 MG/10ML IV SOLN
INTRAVENOUS | Status: DC | PRN
  Administered 2011-08-12: 20 mg via INTRAVENOUS

## 2011-08-12 MED ORDER — DOCUSATE SODIUM 100 MG PO CAPS
100.0000 mg | ORAL_CAPSULE | Freq: Two times a day (BID) | ORAL | Status: DC
  Administered 2011-08-12 – 2011-08-15 (×4): 100 mg via ORAL

## 2011-08-12 MED ORDER — MENTHOL 3 MG MT LOZG
1.0000 | LOZENGE | OROMUCOSAL | Status: DC | PRN
  Filled 2011-08-12: qty 9

## 2011-08-12 MED ORDER — PHENOL 1.4 % MT LIQD
1.0000 | OROMUCOSAL | Status: DC | PRN
  Filled 2011-08-12: qty 177

## 2011-08-12 MED ORDER — VANCOMYCIN HCL IN DEXTROSE 1-5 GM/200ML-% IV SOLN
1000.0000 mg | Freq: Two times a day (BID) | INTRAVENOUS | Status: AC
  Administered 2011-08-13: 1000 mg via INTRAVENOUS
  Filled 2011-08-12: qty 200

## 2011-08-12 MED ORDER — HYDROCODONE-ACETAMINOPHEN 5-325 MG PO TABS: 1.0000 | ORAL_TABLET | ORAL | Status: DC | PRN

## 2011-08-12 MED ORDER — FENTANYL CITRATE 0.05 MG/ML IJ SOLN
INTRAMUSCULAR | Status: DC | PRN
  Administered 2011-08-12: 50 ug via INTRAVENOUS

## 2011-08-12 MED ORDER — ONDANSETRON HCL 4 MG PO TABS: 4.0000 mg | ORAL_TABLET | Freq: Four times a day (QID) | ORAL | Status: DC | PRN

## 2011-08-12 MED ORDER — SODIUM CHLORIDE 0.9 % IV SOLN
INTRAVENOUS | Status: AC
  Administered 2011-08-12: via INTRAVENOUS
  Filled 2011-08-12 (×2): qty 1000

## 2011-08-12 MED ORDER — PROMETHAZINE HCL 25 MG/ML IJ SOLN: 6.2500 mg | INTRAMUSCULAR | Status: DC | PRN

## 2011-08-12 MED ORDER — HYDROMORPHONE HCL PF 1 MG/ML IJ SOLN: 0.5000 mg | INTRAMUSCULAR | Status: DC | PRN

## 2011-08-12 MED ORDER — ONDANSETRON HCL 4 MG/2ML IJ SOLN
INTRAMUSCULAR | Status: DC | PRN
  Administered 2011-08-12: 4 mg via INTRAVENOUS

## 2011-08-12 MED ORDER — METOCLOPRAMIDE HCL 10 MG PO TABS: 5.0000 mg | ORAL_TABLET | Freq: Three times a day (TID) | ORAL | Status: DC | PRN

## 2011-08-12 MED ORDER — BISACODYL 10 MG RE SUPP
10.0000 mg | Freq: Once | RECTAL | Status: AC
  Administered 2011-08-12: 10 mg via RECTAL
  Filled 2011-08-12: qty 1

## 2011-08-12 MED ORDER — ENOXAPARIN SODIUM 40 MG/0.4ML ~~LOC~~ SOLN
40.0000 mg | SUBCUTANEOUS | Status: DC
  Administered 2011-08-13 – 2011-08-15 (×3): 40 mg via SUBCUTANEOUS
  Filled 2011-08-12 (×3): qty 0.4

## 2011-08-12 MED ORDER — DEXAMETHASONE SODIUM PHOSPHATE 10 MG/ML IJ SOLN
INTRAMUSCULAR | Status: DC | PRN
  Administered 2011-08-12: 10 mg via INTRAVENOUS

## 2011-08-12 MED ORDER — ACETAMINOPHEN 10 MG/ML IV SOLN
INTRAVENOUS | Status: DC | PRN
  Administered 2011-08-12: 1000 mg via INTRAVENOUS

## 2011-08-12 MED ORDER — ACETAMINOPHEN 325 MG PO TABS
650.0000 mg | ORAL_TABLET | Freq: Four times a day (QID) | ORAL | Status: DC | PRN
  Administered 2011-08-13 – 2011-08-14 (×3): 650 mg via ORAL
  Filled 2011-08-12 (×4): qty 2

## 2011-08-12 MED ORDER — MAGNESIUM CITRATE PO SOLN: 1.0000 | Freq: Once | ORAL | Status: AC | PRN

## 2011-08-12 MED ORDER — BACLOFEN 10 MG PO TABS
10.0000 mg | ORAL_TABLET | Freq: Three times a day (TID) | ORAL | Status: DC
  Administered 2011-08-12 – 2011-08-15 (×8): 10 mg via ORAL
  Filled 2011-08-12 (×10): qty 1

## 2011-08-12 SURGICAL SUPPLY — 50 items
ADH SKN CLS APL DERMABOND .7 (GAUZE/BANDAGES/DRESSINGS) ×1
BAG SPEC THK2 15X12 ZIP CLS (MISCELLANEOUS) ×1
BAG ZIPLOCK 12X15 (MISCELLANEOUS) ×2 IMPLANT
BLADE SAW SGTL 18X1.27X75 (BLADE) ×2 IMPLANT
CLOTH BEACON ORANGE TIMEOUT ST (SAFETY) ×2 IMPLANT
DERMABOND ADVANCED (GAUZE/BANDAGES/DRESSINGS) ×1
DERMABOND ADVANCED .7 DNX12 (GAUZE/BANDAGES/DRESSINGS) IMPLANT
DRAPE INCISE IOBAN 85X60 (DRAPES) ×2 IMPLANT
DRAPE ORTHO SPLIT 77X108 STRL (DRAPES) ×4
DRAPE POUCH INSTRU U-SHP 10X18 (DRAPES) ×2 IMPLANT
DRAPE SURG 17X11 SM STRL (DRAPES) ×2 IMPLANT
DRAPE SURG ORHT 6 SPLT 77X108 (DRAPES) ×2 IMPLANT
DRAPE U-SHAPE 47X51 STRL (DRAPES) ×2 IMPLANT
DRSG AQUACEL AG ADV 3.5X10 (GAUZE/BANDAGES/DRESSINGS) ×1 IMPLANT
DRSG MEPILEX BORDER 4X4 (GAUZE/BANDAGES/DRESSINGS) ×2 IMPLANT
DRSG MEPILEX BORDER 4X8 (GAUZE/BANDAGES/DRESSINGS) ×2 IMPLANT
DRSG TEGADERM 4X4.75 (GAUZE/BANDAGES/DRESSINGS) ×1 IMPLANT
DURAPREP 26ML APPLICATOR (WOUND CARE) ×2 IMPLANT
ELECT BLADE TIP CTD 4 INCH (ELECTRODE) ×2 IMPLANT
ELECT REM PT RETURN 9FT ADLT (ELECTROSURGICAL) ×2
ELECTRODE REM PT RTRN 9FT ADLT (ELECTROSURGICAL) ×1 IMPLANT
EVACUATOR 1/8 PVC DRAIN (DRAIN) ×2 IMPLANT
FACESHIELD LNG OPTICON STERILE (SAFETY) ×8 IMPLANT
GAUZE SPONGE 2X2 8PLY STRL LF (GAUZE/BANDAGES/DRESSINGS) IMPLANT
GLOVE BIOGEL PI IND STRL 7.5 (GLOVE) ×1 IMPLANT
GLOVE BIOGEL PI IND STRL 8 (GLOVE) ×1 IMPLANT
GLOVE BIOGEL PI INDICATOR 7.5 (GLOVE) ×3
GLOVE BIOGEL PI INDICATOR 8 (GLOVE) ×1
GLOVE ECLIPSE 8.0 STRL XLNG CF (GLOVE) ×2 IMPLANT
GLOVE ORTHO TXT STRL SZ7.5 (GLOVE) ×4 IMPLANT
GLOVE SURG ORTHO 8.0 STRL STRW (GLOVE) ×2 IMPLANT
GOWN BRE IMP PREV XXLGXLNG (GOWN DISPOSABLE) ×5 IMPLANT
GOWN STRL NON-REIN LRG LVL3 (GOWN DISPOSABLE) ×2 IMPLANT
HANDPIECE INTERPULSE COAX TIP (DISPOSABLE)
IMMOBILIZER KNEE 20 (SOFTGOODS)
IMMOBILIZER KNEE 20 THIGH 36 (SOFTGOODS) IMPLANT
KIT BASIN OR (CUSTOM PROCEDURE TRAY) ×2 IMPLANT
MANIFOLD NEPTUNE II (INSTRUMENTS) ×2 IMPLANT
PACK TOTAL JOINT (CUSTOM PROCEDURE TRAY) ×2 IMPLANT
POSITIONER SURGICAL ARM (MISCELLANEOUS) ×2 IMPLANT
SET HNDPC FAN SPRY TIP SCT (DISPOSABLE) IMPLANT
SPONGE GAUZE 2X2 STER 10/PKG (GAUZE/BANDAGES/DRESSINGS) ×1
STRIP CLOSURE SKIN 1/2X4 (GAUZE/BANDAGES/DRESSINGS) ×3 IMPLANT
SUT ETHIBOND NAB CT1 #1 30IN (SUTURE) ×2 IMPLANT
SUT MNCRL AB 4-0 PS2 18 (SUTURE) ×2 IMPLANT
SUT VIC AB 1 CT1 36 (SUTURE) ×4 IMPLANT
SUT VIC AB 2-0 CT1 27 (SUTURE) ×4
SUT VIC AB 2-0 CT1 TAPERPNT 27 (SUTURE) ×2 IMPLANT
TOWEL OR 17X26 10 PK STRL BLUE (TOWEL DISPOSABLE) ×4 IMPLANT
TRAY FOLEY CATH 14FRSI W/METER (CATHETERS) ×2 IMPLANT

## 2011-08-12 NOTE — H&P (Signed)
Andrew Sullivan is an 41 y.o. male.   HPI: 41 year old male who was ambulating with use of crutches last Wednesday when he lost his footing and fell. No LOC, dizziness or chest pain at time of fall. States he fell onto a medicine bottle that was in his pocket. Patient with history of fall downstairs at age 78 that left him paraparesis of both legs. Patient denies any bowel or bladder incontience. He has been in bed since the fall trying to let things heal. Was brought to the ER where he was diagnosised with left hip fracture. Orthopaedics was consulted. Risks, benefits and expectations were discussed with the patient. Patient understand the risks, benefits and expectations and wishes to proceed with surgery.   Past Medical History  Diagnosis Date  . Cervical spine fracture     C4-C6 with fusion  . Paraparesis of both lower limbs   . History of recurrent UTIs   . Erectile dysfunction    Past Surgical History  Procedure Date  . Cervical fusion     History   Social History  . Marital Status: Single    Spouse Name: N/A    Number of Children: N/A  . Years of Education: N/A   Occupational History  . Not on file.   Social History Main Topics  . Smoking status: Former Games developer  . Smokeless tobacco: Never Used  . Alcohol Use: No  . Drug Use: No  . Sexually Active: Not on file   Other Topics Concern  . Not on file   Social History Narrative   1 brother 1 sister.  Pt. Lives in [place] with his mother.  denies any EtOH use tobacco or illicit drug use. Remains active.  Is on disability secondary to neck fracture.    Allergies  Allergen Reactions  . Penicillins     REACTION: itching and rash    Current Outpatient Prescriptions on File Prior to Encounter  Medication Sig Dispense Refill  . baclofen (LIORESAL) 10 MG tablet Take 1 tablet (10 mg total) by mouth 3 (three) times daily.  90 tablet  6   Dg Hip Complete Left  08/11/2011 *RADIOLOGY REPORT* Clinical Data: Pain post fall  LEFT HIP - COMPLETE 2+ VIEW Comparison: 06/11/2010 Findings: There is a left impacted mid-cervical femur fracture, minimally comminuted. No dislocation. Pelvis appears intact. Bilateral pelvic phleboliths. IMPRESSION: 1. Impacted minimally comminuted mid-cervical fracture, left femur. Original Report Authenticated By: Osa Craver, M.D.    Review of Systems  Constitutional: Negative.  Eyes: Negative.  Respiratory: Negative.  Cardiovascular: Negative.  Gastrointestinal: Negative.  Genitourinary: Negative.  Skin: Negative.  Neurological: Negative for sensory change, speech change, seizures and loss of consciousness.  Endo/Heme/Allergies: Negative.  Psychiatric/Behavioral: Negative. Negative for substance abuse. MSK: Left hip pain, limited ambulation.   Physical Exam  Blood pressure 126/51, pulse 81, temperature 98.4 F (36.9 C), temperature source Oral, resp. rate 18, SpO2 98.00%. Constitutional: He is oriented to person, place, and time. He appears well-developed and well-nourished.  HENT:     Head: Normocephalic and atraumatic.     Eyes: EOM are normal.  Cardiovascular: Normal rate, regular rhythm and intact distal pulses. Exam reveals no gallop and no friction rub. No murmur heard.  Respiratory: Effort normal and breath sounds normal. No respiratory distress. He has no wheezes. He has no rales.  GI: Soft. Bowel sounds are normal. He exhibits no distension.  Musculoskeletal:  Bilateral feet, ankles lower legs non tender to palpation. Gentle ROM  right hip no pain any ROM left hip causes pain, slight shortening of left leg.  Neurological: He is alert and oriented to person, place, and time.  Loss of motor function bilateral lower extremities. Normal sensation bilateral lower to light touch  Skin: Skin is warm and dry.  Psychiatric: He has a normal mood and affect. His behavior is normal.    Assessment/Plan: Assessment: Left femoral neck fracture  Plan: Pt was seen by Dr.  Charlann Boxer and scheduled for left hip ORIF. Risks, benefits and expectations were discussed with the patient. Patient understand the risks, benefits and expectations and wishes to proceed with surgery.   Anastasio Auerbach Zakaiya Lares   PAC  08/12/2011, 5:15 PM

## 2011-08-12 NOTE — Anesthesia Preprocedure Evaluation (Addendum)
Anesthesia Evaluation  Patient identified by MRN, date of birth, ID band Patient awake    Reviewed: Allergy & Precautions, H&P , NPO status , Patient's Chart, lab work & pertinent test results  Airway Mallampati: II TM Distance: >3 FB Neck ROM: Full    Dental  (+) Teeth Intact and Dental Advisory Given   Pulmonary neg pulmonary ROS,  breath sounds clear to auscultation  Pulmonary exam normal       Cardiovascular negative cardio ROS  Rhythm:Regular Rate:Normal     Neuro/Psych Hemiparesis, paraparesis of lower limbs; history cspine fracture negative neurological ROS  negative psych ROS   GI/Hepatic negative GI ROS, Neg liver ROS,   Endo/Other  negative endocrine ROS  Renal/GU negative Renal ROS  negative genitourinary   Musculoskeletal negative musculoskeletal ROS (+)   Abdominal   Peds negative pediatric ROS (+)  Hematology negative hematology ROS (+)   Anesthesia Other Findings   Reproductive/Obstetrics negative OB ROS                          Anesthesia Physical Anesthesia Plan  ASA: III  Anesthesia Plan: General   Post-op Pain Management:    Induction: Intravenous  Airway Management Planned: Oral ETT  Additional Equipment:   Intra-op Plan:   Post-operative Plan: Extubation in OR  Informed Consent: I have reviewed the patients History and Physical, chart, labs and discussed the procedure including the risks, benefits and alternatives for the proposed anesthesia with the patient or authorized representative who has indicated his/her understanding and acceptance.   Dental advisory given  Plan Discussed with: CRNA  Anesthesia Plan Comments:         Anesthesia Quick Evaluation

## 2011-08-12 NOTE — Op Note (Signed)
NAME:  Andrew Sullivan                ACCOUNT NO.:  [id number]   MEDICAL RECORD NO.: [id number]   LOCATION:  1435                         FACILITY:  [facility]   DATE OF BIRTH:  [birth year]  PHYSICIAN:  Madlyn Frankel. Charlann Boxer, M.D.     DATE OF PROCEDURE:  08/12/2011                               OPERATIVE REPORT     PREOPERATIVE DIAGNOSIS:  Left displaced femoral neck fracture.   POSTOPERATIVE DIAGNOSIS:  Left displaced femoral neck fracture.   PROCEDURE:  Left hip hemiarthroplasty utilizing DePuy component, size 11 standard Tri-Lock stem with a 56 unipolar ball with a +5 adapter.   SURGEON:  Madlyn Frankel. Charlann Boxer, MD   ASSISTANT:  Lanney Gins, PA-C.   ANESTHESIA:  General.   SPECIMENS:  None.   DRAINS:  One medium Hemovac.   BLOOD LOSS:  About 300 cc.   COMPLICATIONS:  None.   INDICATION OF PROCEDURE:  Andrew Sullivan is a 41 year old male with 25 year history of C4 spinal cord injury, but functioning.   He unfortunately had a fall at his house about 5 days prior to admission.  He was admitted to the hospital after radiographs revealed a femoral neck fracture.  He was seen and evaluated and was scheduled for surgery for Hemiarthroplasty based on location of fraction (subcapital) and duration from acute event and subsequent concerns of nonunion and failure of fixation from other procedures.  The necessity of surgical repair was discussed with him.  Consent was obtained after reviewing risks of infection, DVT, component failure, and need for revision surgery.   PROCEDURE IN DETAIL:  The patient was brought to the operative theater. Once adequate anesthesia, preoperative antibiotics, 1 g of Vancomycin administered, the patient was positioned into the right lateral decubitus position with the left side up.  The left lower extremity was then prepped and draped in sterile fashion.  A time-out was performed identifying the patient, planned procedure, and extremity.   A lateral incision was made  off the proximal trochanter. Sharp dissection was carried down to the iliotibial band and gluteal fascia. The gluteal fascia was then incised for posterior approach.  The short external rotators were taken down separate from the posterior capsule. An L capsulotomy was made preserving the posterior leaflet for later anatomic repair. Fracture site was identified and after removing comminuted segments of the posterior femoral neck, the femoral head was removed without difficulty and measured on the back table  using the sizing rings and determined to be 56 mm in diameter.   The proximal femur was then exposed.  Retractors placed.  I then drilled, opened the proximal femur.  Then I hand reamed once and  Irrigated the canal to try to prevent fat emboli.  I began broaching the femur with a starting broach up to a size 11 broach with good medial and lateral metaphyseal fit without evidence of any torsion or movement.  A trial reduction was carried out with a standard neck and initially a +0 adapter with a 56 ball.  The hip reduced nicely.  The leg lengths were nearly equal as compared to the down leg.   The hip went through a range of motion  without evidence of any subluxation or impingement.   Given these findings, the trial components removed.  The final 11 standard  Tri-Lock stem was opened.  After irrigating the canal, the final stem was impacted and sat at the level where the broach was. Based on this and the trial reduction, a +5 adapter was opened and impacted in the 56mm unipolar ball onto a clean and dry trunnion.  The hip had been irrigated throughout the case and again at this point.  I re- Approximated the posterior capsule to the superior leaflet using a  #1 Vicryl,  and placed a medium Hemovac drain deep.  The remainder of the wound was closed with #1 Vicryl in the iliotibial band and gluteal fascia, a  2-0 Vicryl in the sub-Q tissue and a running 4-0 Monocryl in the skin.  The  hip was cleaned, dried, and dressed sterilely using Dermabond and Aquacel dressing.  Drain site was dressed separately.  She was then brought to recovery room, extubated in stable condition, tolerating the procedure well.  Lanney Gins, PA-C was present and utilized as Geophysicist/field seismologist for the entire case from  Preoperative positioning to management of the operative extremity and retractors to  General facilitation of the procedure.  He was also involved with primary wound closure.         Madlyn Frankel Charlann Boxer, M.D.

## 2011-08-12 NOTE — Anesthesia Procedure Notes (Signed)
Procedure Name: Intubation Date/Time: 08/12/2011 8:01 PM Performed by: Doran Clay Pre-anesthesia Checklist: Patient identified, Timeout performed, Emergency Drugs available, Suction available and Patient being monitored Patient Re-evaluated:Patient Re-evaluated prior to inductionOxygen Delivery Method: Circle system utilized Preoxygenation: Pre-oxygenation with 100% oxygen Intubation Type: IV induction Ventilation: Mask ventilation without difficulty Grade View: Grade I Tube size: 7.5 mm Placement Confirmation: ETT inserted through vocal cords under direct vision,  positive ETCO2 and breath sounds checked- equal and bilateral Secured at: 23 cm Tube secured with: Tape

## 2011-08-12 NOTE — Transfer of Care (Signed)
Immediate Anesthesia Transfer of Care Note  Patient: Andrew Sullivan  Procedure(s) Performed: Procedure(s) (LRB): ARTHROPLASTY BIPOLAR HIP (Left)  Patient Location: PACU  Anesthesia Type: General  Level of Consciousness: sedated  Airway & Oxygen Therapy: Patient Spontanous Breathing and Patient connected to face mask oxygen  Post-op Assessment: Report given to PACU RN and Post -op Vital signs reviewed and stable  Post vital signs: Reviewed and stable  Complications: No apparent anesthesia complications

## 2011-08-12 NOTE — Progress Notes (Signed)
Pharmacy Note:  Home Medication List  Patient's home medication list has not yet been addressed by physician.  Please specify which home meds should be continued in-hospital.  Thank you, Elie Goody, PharmD, BCPS Pager: [phone] 08/12/2011  6:43 AM

## 2011-08-12 NOTE — Progress Notes (Signed)
VASCULAR LAB PRELIMINARY  PRELIMINARY  PRELIMINARY  PRELIMINARY  Bilateral lower extremity venous duplex  completed.    Preliminary report:  Bilateral:  No evidence of DVT, superficial thrombosis, or Baker's Cyst.    Terance Hart, RVT 08/12/2011, 9:21 AM

## 2011-08-13 LAB — BASIC METABOLIC PANEL
BUN: 17 mg/dL (ref 6–23)
CO2: 23 mEq/L (ref 19–32)
Chloride: 102 mEq/L (ref 96–112)
Creatinine, Ser: 0.67 mg/dL (ref 0.50–1.35)
GFR calc Af Amer: >90 mL/min (ref >90)
GFR calc non Af Amer: >90 mL/min (ref >90)

## 2011-08-13 LAB — CBC
HCT: 36.2 % — ABNORMAL LOW (ref 39.0–52.0)
MCV: 90.5 fL (ref 78.0–100.0)
RBC: 4 MIL/uL — ABNORMAL LOW (ref 4.22–5.81)
WBC: 5.6 10*3/uL (ref 4.0–10.5)

## 2011-08-13 NOTE — Progress Notes (Signed)
Utilization review completed.  

## 2011-08-13 NOTE — Progress Notes (Signed)
Clinical Social Work Department CLINICAL SOCIAL WORK PLACEMENT NOTE 08/13/2011  Patient:  Andrew Sullivan, Andrew Sullivan  Account Number:  [id number] Admit date:  08/11/2011  Clinical Social Worker:  Cori Razor, LCSW  Date/time:  08/13/2011 11:30 AM  Clinical Social Work is seeking post-discharge placement for this patient at the following level of care:   SKILLED NURSING   (*CSW will update this form in Epic as items are completed)   08/13/2011  Patient/family provided with [facility] Department of Clinical Social Work's list of facilities offering this level of care within the geographic area requested by the patient (or if unable, by the patient's family).  08/13/2011  Patient/family informed of their freedom to choose among providers that offer the needed level of care, that participate in Medicare, Medicaid or managed care program needed by the patient, have an available bed and are willing to accept the patient.  08/13/2011  Patient/family informed of [facility]' ownership interest in [facility], as well as of the fact that they are under no obligation to receive care at this facility.  PASARR submitted to [employer] on 08/13/2011 PASARR number received from [employer] on   FL2 transmitted to all facilities in geographic area requested by pt/family on  08/13/2011 FL2 transmitted to all facilities within larger geographic area on 08/13/2011  Patient informed that his/her managed care company has contracts with or will negotiate with  certain facilities, including the following:     Patient/family informed of bed offers received:   Patient chooses bed at  Physician recommends and patient chooses bed at    Patient to be transferred to  on   Patient to be transferred to facility by   The following physician request were entered in Epic:   Additional Comments:  Cori Razor LCSW [phone]

## 2011-08-13 NOTE — Evaluation (Signed)
Physical Therapy Evaluation Patient Details Name: Andrew Sullivan MRN: [id number] DOB: 02-Jun-1970 Today's Date: 08/13/2011 Time: 7829-5621 PT Time Calculation (min): 42 min  PT Assessment / Plan / Recommendation Clinical Impression  Pt with L hip fx s/p fall and with long history of cervical fx and residual deficits affecting all limbs presents wtih decreased strength all 4s, abnormal tone B LEs(R greater than L)  and significantly ltd functional mobility    PT Assessment  Patient needs continued PT services    Follow Up Recommendations  Skilled nursing facility;Inpatient Rehab    Barriers to Discharge Decreased caregiver support (mother works)      lEquipment Recommendations  Defer to next venue    Recommendations for Other Services OT consult   Frequency Min 6X/week    Precautions / Restrictions Restrictions Weight Bearing Restrictions: No Other Position/Activity Restrictions: WBAT         Mobility  Bed Mobility Bed Mobility: Supine to Sit Supine to Sit: 1: +2 Total assist Supine to Sit: Patient Percentage: 80% Details for Bed Mobility Assistance: pt self assisted R LE with UEs ; assist provided for L LE;  Transfers Transfers: Sit to Stand;Stand to Sit Sit to Stand: 1: +2 Total assist Sit to Stand: Patient Percentage: 40% Stand to Sit: 1: +2 Total assist Stand to Sit: Patient Percentage: 40% Stand Pivot Transfers: 1: +2 Total assist Stand Pivot Transfers: Patient Percentage: 40% Details for Transfer Assistance: cues for use of UEs; assist to lock knees ; abnormal tone inhibiting ability to WB    Exercises Total Joint Exercises Ankle Circles/Pumps: AAROM;10 reps;Supine;Both Heel Slides: 10 reps;20 reps;AAROM;Supine;Both (stretch into flexion on R) Hip ABduction/ADduction: AAROM;15 reps;Supine;Both   PT Diagnosis: Quadraplegia;Difficulty walking  PT Problem List: Decreased strength;Decreased range of motion;Decreased balance;Decreased mobility;Decreased  coordination;Decreased knowledge of use of DME;Pain PT Treatment Interventions: DME instruction;Gait training;Functional mobility training;Therapeutic activities;Therapeutic exercise;Balance training;Patient/family education   PT Goals Acute Rehab PT Goals PT Goal Formulation: With patient Time For Goal Achievement: 08/19/11 Potential to Achieve Goals: Fair Pt will go Supine/Side to Sit: with supervision PT Goal: Supine/Side to Sit - Progress: Goal set today Pt will go Sit to Supine/Side: with min assist PT Goal: Sit to Supine/Side - Progress: Goal set today Pt will go Sit to Stand: with +2 total assist (pt 60%) PT Goal: Sit to Stand - Progress: Goal set today Pt will go Stand to Sit: with +2 total assist (pt 60%) PT Goal: Stand to Sit - Progress: Goal set today Pt will Transfer Bed to Chair/Chair to Bed: with min assist (lateral transfer with LRAD) PT Transfer Goal: Bed to Chair/Chair to Bed - Progress: Goal set today Pt will Stand: 1 - 2 min PT Goal: Stand - Progress: Goal set today Pt will Ambulate: 1 - 15 feet;with +2 total assist PT Goal: Ambulate - Progress: Goal set today  Visit Information  Last PT Received On: 08/13/11 Assistance Needed: +2    Subjective Data  Subjective: I was walking on crutches but it always took a Przybylski while when I got up for the spasms to diminish in my legs and let me walk easier.   (   I guess now I'm a handicapped handicapped person) Patient Stated Goal: Get back to where I wasI   Prior Functioning  Home Living Lives With: Spouse Available Help at Discharge: Family Prior Function Level of Independence: Independent with assistive device(s) Able to Take Stairs?: No Driving: Yes Vocation: On disability Communication Communication: No difficulties    Cognition  Overall Cognitive Status: Appears within functional limits for tasks assessed/performed Arousal/Alertness: Awake/alert Orientation Level: Appears intact for tasks assessed Behavior  During Session: [facility] for tasks performed    Extremity/Trunk Assessment Right Upper Extremity Assessment RUE ROM/Strength/Tone: Deficits RUE ROM/Strength/Tone Deficits: ltd hand strength and finger flexion RUE Sensation: WFL - Light Touch Left Upper Extremity Assessment LUE ROM/Strength/Tone: Deficits LUE ROM/Strength/Tone Deficits: ltd hand strength and finger flexion LUE Sensation: WFL - Light Touch Right Lower Extremity Assessment RLE ROM/Strength/Tone: Deficits RLE ROM/Strength/Tone Deficits: AAROM WFL with 2-/5  strength and abnormal tone RLE Sensation: WFL - Light Touch RLE Coordination: Deficits RLE Coordination Deficits: abnormal tone Left Lower Extremity Assessment LLE ROM/Strength/Tone: Deficits LLE ROM/Strength/Tone Deficits: 1+ - 2-/5 strength, AAROM WFL following THP; decreased tone present vs R LE LLE Sensation: WFL - Light Touch LLE Coordination: Deficits LLE Coordination Deficits: 2* weakness and abnormal tone   Balance    End of Session PT - End of Session Equipment Utilized During Treatment: Gait belt Activity Tolerance: Patient tolerated treatment well Patient left: in chair;with call bell/phone within reach Nurse Communication: Mobility status   Andrew Sullivan 08/13/2011, 1:23 PM

## 2011-08-13 NOTE — Progress Notes (Signed)
Clinical Social Work Department BRIEF PSYCHOSOCIAL ASSESSMENT 08/13/2011  Patient:  Andrew Sullivan, Andrew Sullivan     Account Number:  [id number]     Admit date:  08/11/2011  Clinical Social Worker:  Candie Chroman  Date/Time:  08/13/2011 11:22 AM  Referred by:  Physician  Date Referred:  08/13/2011 Referred for  SNF Placement   Other Referral:   Interview type:  Patient Other interview type:    PSYCHOSOCIAL DATA Living Status:  FAMILY Admitted from facility:   Level of care:   Primary support name:  [name] Primary support relationship to patient:  PARENT Degree of support available:   supportive    CURRENT CONCERNS Current Concerns  Post-Acute Placement   Other Concerns:    SOCIAL WORK ASSESSMENT / PLAN Pt is a 41 yr old gentleman living at home prior to hospitalization. Met with pt this am to assist with d/c planning. Pt is requesting ST SNF placement following hospitalization. CSW has faxed pt out to [place] and surrounding counties.Medicaid prior auth has been requested .Will provide bed offers as received.   Assessment/plan status:  Psychosocial Support/Ongoing Assessment of Needs Other assessment/ plan:   Information/referral to community resources:    PATIENT'S/FAMILY'S RESPONSE TO PLAN OF CARE: Pt is home, alone, during the day and feels ST SNF placement will be needed upon d/c.    Cori Razor LCSW [phone]

## 2011-08-13 NOTE — Progress Notes (Signed)
Subjective: 1 Day Post-Op Procedure(s) (LRB): ARTHROPLASTY BIPOLAR HIP (Left)   Patient reports pain as mild, pain well controlled. No events throughout the night.  Objective:   VITALS:   Filed Vitals:   08/13/11 0800  BP: 120/69  Pulse: 63  Temp: 98.7 F (37.1 C)  Resp: 16    Incision: dressing C/D/I No cellulitis present Compartment soft  LABS  Basename 08/13/11 0403 08/11/11 1733  HGB 12.0* 12.7*  HCT 36.2* 37.9*  WBC 5.6 3.5*  PLT 212 172     Basename 08/13/11 0403 08/11/11 1733  NA 135 139  K 4.4 4.2  BUN 17 24*  CREATININE 0.67 0.94  GLUCOSE 116* 95     Assessment/Plan: 1 Day Post-Op Procedure(s) (LRB): ARTHROPLASTY BIPOLAR HIP (Left)   HV drain d/c'ed Foley cath d/c'ed Advance diet Up with therapy D/C IV fluids   Anastasio Auerbach. Amen Staszak   PAC  08/13/2011, 9:07 AM

## 2011-08-14 ENCOUNTER — Encounter (HOSPITAL_COMMUNITY): Payer: Self-pay | Admitting: Orthopedic Surgery

## 2011-08-14 DIAGNOSIS — S72009A Fracture of unspecified part of neck of unspecified femur, initial encounter for closed fracture: Secondary | ICD-10-CM

## 2011-08-14 DIAGNOSIS — W010XXA Fall on same level from slipping, tripping and stumbling without subsequent striking against object, initial encounter: Secondary | ICD-10-CM

## 2011-08-14 LAB — CBC
Hemoglobin: 10.8 g/dL — ABNORMAL LOW (ref 13.0–17.0)
MCH: 30.1 pg (ref 26.0–34.0)
Platelets: 203 10*3/uL (ref 150–400)
RBC: 3.59 MIL/uL — ABNORMAL LOW (ref 4.22–5.81)

## 2011-08-14 NOTE — Evaluation (Signed)
Occupational Therapy Evaluation Patient Details Name: Andrew Sullivan MRN: [id number] DOB: [birth year] Today's Date: 08/14/2011 Time: 5621-3086 OT Time Calculation (min): 21 min  OT Assessment / Plan / Recommendation Clinical Impression  This 41 year old man was admitted after fall resulting in L hip fx which was surgically managed with hemiarthroplasty.  He has THPs. Pt has prior history of neck fx and had weakness throughout and used walker to ambulate.  He now needs A x 2 for LB ADLs and mobility.  He is appropriate for skilled OT in decrese burden of care at next venue.    OT Assessment  Patient needs continued OT Services    Follow Up Recommendations  Inpatient Rehab    Barriers to Discharge      Equipment Recommendations  Defer to next venue    Recommendations for Other Services Rehab consult  Frequency  Min 2X/week    Precautions / Restrictions Precautions Precautions: Posterior Hip Restrictions Weight Bearing Restrictions: Yes Other Position/Activity Restrictions: WBAT       ADL  Eating/Feeding: Simulated;Independent Where Assessed - Eating/Feeding: Edge of bed Grooming: Simulated;Set up Where Assessed - Grooming: Unsupported sitting Upper Body Bathing: Simulated;Set up Where Assessed - Upper Body Bathing: Unsupported sitting Lower Body Bathing: Simulated;+2 Total assistance (pt 70% with AE) Where Assessed - Lower Body Bathing: Supported sit to stand Upper Body Dressing: Simulated;Minimal assistance (iv) Where Assessed - Upper Body Dressing: Unsupported sitting Lower Body Dressing: Simulated;+2 Total assistance (with AE) Lower Body Dressing: Patient Percentage: 30% Where Assessed - Lower Body Dressing: Sopported sit to stand Toileting - Clothing Manipulation and Hygiene: Simulated;+2 Total assistance Toileting - Clothing Manipulation and Hygiene: Patient Percentage: 20% Where Assessed - Toileting Clothing Manipulation and Hygiene: Sit to stand from 3-in-1 or  toilet Equipment Used: Reacher;Sock aid Transfers/Ambulation Related to ADLs: worked on sit to stand only:  cotx with PT.  3rd person used to block knees ADL Comments: used sock aid at EOB on RLE:  leg came up with pulliing.  Will need to sit greater than 90 degrees for safety. Pt unable to lift RLE at this time for ADLs.      OT Diagnosis: Generalized weakness  OT Problem List: Decreased strength;Decreased range of motion;Impaired balance (sitting and/or standing);Decreased activity tolerance;Decreased knowledge of use of DME or AE;Decreased knowledge of precautions OT Treatment Interventions: Self-care/ADL training;DME and/or AE instruction;Therapeutic activities;Patient/family education;Balance training   OT Goals Acute Rehab OT Goals OT Goal Formulation: With patient Time For Goal Achievement: 08/28/11 Potential to Achieve Goals: Good ADL Goals Pt Will Perform Lower Body Bathing: with 2+ total assist;Sit to stand from bed;with adaptive equipment (pt 80% for bathing and 60% for sit to stand) ADL Goal: Lower Body Bathing - Progress: Goal set today Pt Will Perform Lower Body Dressing: with 2+ total assist;Sit to stand from bed;with adaptive equipment (pt 50% with AE and 60% for sit to stand) ADL Goal: Lower Body Dressing - Progress: Goal set today Pt Will Transfer to Toilet: with 2+ total assist;3-in-1;Squat pivot transfer;Maintaining hip precautions (pt 50%) ADL Goal: Toilet Transfer - Progress: Goal set today  Visit Information  Last OT Received On: 08/14/11 Assistance Needed: +2    Subjective Data  Subjective: "I used that Lexicographer) in '89" Patient Stated Goal: get back to baseline   Prior Functioning  Home Living Available Help at Discharge: Family Dominant Hand:  (L hand stronger)    Cognition  Overall Cognitive Status: Appears within functional limits for tasks assessed/performed Behavior During Session: [facility] for  tasks performed    Extremity/Trunk Assessment Right Upper  Extremity Assessment RUE ROM/Strength/Tone Deficits: L hand strength 3+/5 Left Upper Extremity Assessment LUE ROM/Strength/Tone Deficits: R hand strength 4-/5   Mobility Transfers Details for Transfer Assistance: performed by PT and tech this am   Exercise    Balance    End of Session OT - End of Session Equipment Utilized During Treatment: Gait belt Activity Tolerance: Patient tolerated treatment well Patient left: in bed;with call bell/phone within reach;Other (comment) (eob:  pt will call nursing to lie back down)   Makesha Belitz 08/14/2011, 10:59 AM Marica Otter, OTR/L [phone] 08/14/2011

## 2011-08-14 NOTE — Care Management Note (Unsigned)
    Page 1 of 2   08/14/2011     5:28:44 PM   CARE MANAGEMENT NOTE 08/14/2011  Patient:  Andrew Sullivan, Andrew Sullivan   Account Number:  [id number]  Date Initiated:  08/14/2011  Documentation initiated by:  Colleen Can  Subjective/Objective Assessment:   dx femur fracture following fall at home; hx cervical spine injury with paraparesis; left hemi-hip on o6/o4/2013     Action/Plan:   Inpatient rehab [facility] choice [facility]  2nd choice snf  Pt is from home. lives with mother. Wants to go to inpatient rehab   Anticipated DC Date:  08/15/2011   Anticipated DC Plan:  IP REHAB FACILITY  In-house referral  Clinical Social Worker      DC Planning Services  CM consult      PAC Choice  IP REHAB   Choice offered to / List presented to:  C-1 Patient   DME arranged  NA      DME agency  NA     HH arranged  NA      HH agency  NA   Status of service:  In process, will continue to follow Medicare Important Message given?   (If response is "NO", the following Medicare IM given date fields will be blank) Date Medicare IM given:   Date Additional Medicare IM given:    Discharge Disposition:    Per UR Regulation:    If discussed at Long Length of Stay Meetings, dates discussed:    Comments:

## 2011-08-14 NOTE — Progress Notes (Signed)
Physical Therapy Treatment Patient Details Name: Andrew Sullivan MRN: [id number] DOB: 1970-06-30 Today's Date: 08/14/2011 Time: 0272-5366 PT Time Calculation (min): 37 min  PT Assessment / Plan / Recommendation Comments on Treatment Session  Pt stood x 3 with assist of +2.  Pt maintained standing x ~3 minutes total relying largely on UE support through walker  - LEs taking min weight at this time but increased from last attemt per pt    Follow Up Recommendations  Inpatient Rehab    Barriers to Discharge        Equipment Recommendations  Defer to next venue    Recommendations for Other Services OT consult  Frequency Min 6X/week   Plan Discharge plan remains appropriate    Precautions / Restrictions Precautions Precautions: Posterior Hip Restrictions Weight Bearing Restrictions: Yes Other Position/Activity Restrictions: WBAT   Pertinent Vitals/Pain Pt declines pain meds "its OK, I can handle it"    Mobility  Bed Mobility Bed Mobility: Supine to Sit Supine to Sit: 4: Min assist;3: Mod assist Details for Bed Mobility Assistance: pt self assisted R LE with UEs ; assist provided for L LE;  Transfers Transfers: Sit to Stand;Stand to Sit Sit to Stand: 1: +2 Total assist Sit to Stand: Patient Percentage: 60% Stand to Sit: 1: +2 Total assist Stand to Sit: Patient Percentage: 60% Details for Transfer Assistance: cues for use of UEs, assist to bring knees back to assist ability to WB,     Exercises Total Joint Exercises Ankle Circles/Pumps: AAROM;10 reps;Supine;Both Heel Slides: 15 reps;Both;Supine;AAROM Hip ABduction/ADduction: AAROM;15 reps;Supine;Both   PT Diagnosis:    PT Problem List:   PT Treatment Interventions:     PT Goals Acute Rehab PT Goals PT Goal Formulation: With patient Time For Goal Achievement: 08/19/11 Potential to Achieve Goals: Fair Pt will go Supine/Side to Sit: with supervision PT Goal: Supine/Side to Sit - Progress: Progressing toward goal Pt  will go Sit to Stand: with +2 total assist PT Goal: Sit to Stand - Progress: Progressing toward goal Pt will go Stand to Sit: with +2 total assist PT Goal: Stand to Sit - Progress: Progressing toward goal Pt will Stand: 1 - 2 min PT Goal: Stand - Progress: Progressing toward goal  Visit Information  Last PT Received On: 08/14/11 Assistance Needed: +2 PT/OT Co-Evaluation/Treatment: Yes    Subjective Data  Subjective: My legs feel like they are taking a Schreur more weight Patient Stated Goal: Get back to where I wasI   Cognition  Overall Cognitive Status: Appears within functional limits for tasks assessed/performed Arousal/Alertness: Awake/alert Orientation Level: Appears intact for tasks assessed Behavior During Session: [facility] for tasks performed    Balance     End of Session PT - End of Session Equipment Utilized During Treatment: Gait belt Activity Tolerance: Patient tolerated treatment well Patient left: Other (comment) (sitting at bedside) Nurse Communication: Mobility status    Andrew Sullivan 08/14/2011, 12:32 PM

## 2011-08-14 NOTE — Progress Notes (Signed)
Physical Therapy Treatment Patient Details Name: Andrew Sullivan MRN: [id number] DOB: 28-Nov-1970 Today's Date: 08/14/2011 Time: 4098-1191 PT Time Calculation (min): 23 min  PT Assessment / Plan / Recommendation Comments on Treatment Session       Follow Up Recommendations  Inpatient Rehab    Barriers to Discharge        Equipment Recommendations  Defer to next venue    Recommendations for Other Services OT consult  Frequency Min 6X/week   Plan Discharge plan remains appropriate    Precautions / Restrictions Precautions Precautions: Posterior Hip Restrictions Weight Bearing Restrictions: Yes Other Position/Activity Restrictions: WBAT   Pertinent Vitals/Pain     Mobility  Transfers Transfers: Sit to Stand;Stand to Sit Sit to Stand: 1: +2 Total assist Sit to Stand: Patient Percentage: 60% Stand to Sit: 1: +2 Total assist Stand to Sit: Patient Percentage: 60% Details for Transfer Assistance: cues for use of UEs, assist to bring knees back to assist ability to WB,  (Pt stood x5 with assistance for total of ~3 min)    Exercises     PT Diagnosis:    PT Problem List:   PT Treatment Interventions:     PT Goals Acute Rehab PT Goals PT Goal Formulation: With patient Time For Goal Achievement: 08/19/11 Potential to Achieve Goals: Fair Pt will go Supine/Side to Sit: with supervision Pt will go Sit to Stand: with +2 total assist PT Goal: Sit to Stand - Progress: Progressing toward goal Pt will go Stand to Sit: with +2 total assist PT Goal: Stand to Sit - Progress: Progressing toward goal Pt will Stand: 1 - 2 min PT Goal: Stand - Progress: Progressing toward goal  Visit Information  Last PT Received On: 08/14/11 Assistance Needed: +3 or more (third person to block knees)    Subjective Data  Subjective: I might be going to rehab at [facility] tomorrow Patient Stated Goal: Get back to where I wasI   Cognition  Overall Cognitive Status: Appears within functional limits for  tasks assessed/performed Arousal/Alertness: Awake/alert Orientation Level: Appears intact for tasks assessed Behavior During Session: [facility] for tasks performed    Balance     End of Session PT - End of Session Equipment Utilized During Treatment: Gait belt Activity Tolerance: Patient tolerated treatment well Patient left: Other (comment) (sitting at bedside)    Tabbetha Kutscher 08/14/2011, 4:21 PM

## 2011-08-14 NOTE — Consult Note (Signed)
Physical Medicine and Rehabilitation Consult Reason for Consult: Left hip pain due to fracture Referring Physician:  Dr. Charlann Boxer   HPI: Andrew Sullivan is a 41 y.o. male with history of cervical spine injury with myelopathy and paraparesis, who tripped and  fell four days PTA 06/03 with acute onset of hip pain and inability to walk..  X rays done left impacted mid-cervical femur fracture, minimally comminuted.  Patient was evaluated by Dr. Charlann Boxer and underwent left hip hemi on 06/04.  Post op WBAT and on  lovenox for DVT prophylaxis. Post op has had worsening of spasticity. PT/OT evaluations done and patient limited by quadriparesis and tone affecting mobility.  PT, OT, MD recommending CIR.  The patient is continent of bowel and bladder and does not require intermittent catheterization  Review of Systems  Eyes: Negative for blurred vision and double vision.  Respiratory: Negative for cough and shortness of breath.   Cardiovascular: Negative for chest pain and palpitations.  Gastrointestinal: Positive for constipation. Negative for nausea and abdominal pain.  Genitourinary: Negative for urgency and frequency.  Musculoskeletal: Positive for joint pain and falls.  Neurological: Positive for focal weakness. Negative for headaches.   Past Medical History  Diagnosis Date  . Cervical spine fracture     C4-C6 with fusion  . Paraparesis of both lower limbs   . History of recurrent UTIs   . Erectile dysfunction    Past Surgical History  Procedure Date  . Cervical fusion   . Hip arthroplasty 08/12/2011    Procedure: ARTHROPLASTY BIPOLAR HIP;  Surgeon: Shelda Pal, MD;  Location: [facility];  Service: Orthopedics;  Laterality: Left;  Hemi-arthroplasty    Family history: negative for CAD, DM, cancer,CVA, or  bleeding problems.  Social History: Lives with mother and step father.  Independent with crutches for mobility.  He  reports that he has quit smoking. He has never used smokeless tobacco. He  reports that he does not drink alcohol or use illicit drugs.   Allergies  Allergen Reactions  . Penicillins     REACTION: itching and rash   Medications Prior to Admission  Medication Sig Dispense Refill  . baclofen (LIORESAL) 10 MG tablet Take 1 tablet (10 mg total) by mouth 3 (three) times daily.  90 tablet  6  . meloxicam (MOBIC) 15 MG tablet Take 15 mg by mouth daily.        Home: Home Living Lives With: Spouse Available Help at Discharge: Family  Functional History: Prior Function Able to Take Stairs?: No Driving: Yes Vocation: On disability Functional Status:  Mobility: Bed Mobility Bed Mobility: Supine to Sit Supine to Sit: 1: +2 Total assist Supine to Sit: Patient Percentage: 80% Transfers Transfers: Sit to Stand;Stand to Sit Sit to Stand: 1: +2 Total assist Sit to Stand: Patient Percentage: 40% Stand to Sit: 1: +2 Total assist Stand to Sit: Patient Percentage: 40% Stand Pivot Transfers: 1: +2 Total assist Stand Pivot Transfers: Patient Percentage: 40% Lateral/Scoot Transfers: 1: +2 Total assist Lateral Transfers: Patient Percentage: 80%      ADL: ADL Eating/Feeding: Simulated;Independent Where Assessed - Eating/Feeding: Edge of bed Grooming: Simulated;Set up Where Assessed - Grooming: Unsupported sitting Upper Body Bathing: Simulated;Set up Where Assessed - Upper Body Bathing: Unsupported sitting Lower Body Bathing: Simulated;+2 Total assistance (pt 70% with AE) Where Assessed - Lower Body Bathing: Supported sit to stand Upper Body Dressing: Simulated;Minimal assistance (iv) Where Assessed - Upper Body Dressing: Unsupported sitting Lower Body Dressing: Simulated;+2 Total assistance (with AE)  Where Assessed - Lower Body Dressing: Sopported sit to stand Equipment Used: Reacher;Sock aid Transfers/Ambulation Related to ADLs: worked on sit to stand only:  cotx with PT.  3rd person used to block knees ADL Comments: used sock aid at EOB on RLE:  leg came up  with pulliing.  Will need to sit greater than 90 degrees for safety. Pt unable to lift RLE at this time for ADLs.    Cognition: Cognition Arousal/Alertness: Awake/alert Orientation Level: Oriented X4 Cognition Overall Cognitive Status: Appears within functional limits for tasks assessed/performed Arousal/Alertness: Awake/alert Orientation Level: Appears intact for tasks assessed Behavior During Session: [facility] for tasks performed  Blood pressure 115/64, pulse 85, temperature 100 F (37.8 C), temperature source Oral, resp. rate 18, height 6\' 3"  (1.905 m), weight 78.4 kg (172 lb 13.5 oz), SpO2 100.00%. Physical Exam  Nursing note and vitals reviewed. Constitutional: He is oriented to person, place, and time. He appears well-developed and well-nourished.  HENT:  Head: Normocephalic and atraumatic.  Eyes: Pupils are equal, round, and reactive to light.  Neck: Normal range of motion. Neck supple.  Cardiovascular: Normal rate and regular rhythm.   Pulmonary/Chest: Effort normal and breath sounds normal.  Abdominal: Soft. Bowel sounds are normal.  Neurological: He is alert and oriented to person, place, and time. He displays abnormal reflex. He exhibits abnormal muscle tone.  Skin: Skin is warm and dry.  motor strength is 4/5 in the right deltoid, biceps, triceps,, grip 4 minus in the wrist extensor on the left side he is 4+ in the deltoid, biceps, triceps, grip, wrist extensor Lower extremity strength is 2 minus bilateral quads with support 0 at the ankle dorsiflexors and plantar flexors Sensation is intact in the upper and lower extremity Patient has clonus at bilateral ankles. Hyperactive reflex in the right patellar.  Results for orders placed during the hospital encounter of 08/11/11 (from the past 24 hour(s))  CBC     Status: Abnormal   Collection Time   08/14/11  3:50 AM      Component Value Range   WBC 5.2  4.0 - 10.5 (K/uL)   RBC 3.59 (*) 4.22 - 5.81 (MIL/uL)   Hemoglobin 10.8 (*)  13.0 - 17.0 (g/dL)   HCT 04.5 (*) 40.9 - 52.0 (%)   MCV 90.3  78.0 - 100.0 (fL)   MCH 30.1  26.0 - 34.0 (pg)   MCHC 33.3  30.0 - 36.0 (g/dL)   RDW 81.1  91.4 - 78.2 (%)   Platelets 203  150 - 400 (K/uL)   Dg Pelvis Portable  08/12/2011  *RADIOLOGY REPORT*  Clinical Data: Left hip replacement surgery.  PORTABLE PELVIS  Comparison: 08/11/2011.  Findings: The bipolar hip prosthesis is well seated.  No complicating features.  The pubic symphysis and SI joints are intact.  IMPRESSION: Well seated bipolar hip prosthesis.  No complicating features.  Original Report Authenticated By: P. Loralie Champagne, M.D.    Assessment/Plan: Diagnosis: left femoral neck fracture status post bipolar hemiarthroplasty in a patient with chronic incomplete cervical spinal cord injury with quadriparesis 1. Does the need for close, 24 hr/day medical supervision in concert with the patient's rehab needs make it unreasonable for this patient to be served in a less intensive setting? Yes 2. Co-Morbidities requiring supervision/potential complications: fever, spasticity, pain management 3. Due to bowel management, safety, skin/wound care, disease management, medication administration, pain management and patient education, does the patient require 24 hr/day rehab nursing? Yes 4. Does the patient require coordinated care of  a physician, rehab nurse, PT (1-2 hrs/day, 5 days/week) and OT (1-2 hrs/day, 5 days/week) to address physical and functional deficits in the context of the above medical diagnosis(es)? Yes Addressing deficits in the following areas: balance, endurance, locomotion, strength, transferring, bathing, dressing and toileting 5. Can the patient actively participate in an intensive therapy program of at least 3 hrs of therapy per day at least 5 days per week? Yes 6. The potential for patient to make measurable gains while on inpatient rehab is good 7. Anticipated functional outcomes upon discharge from inpatient rehab  are supervision to minimal assist mobility with PT, supervision to minimal assist wheelchair level with ADLs with OT, not applicable with SLP. 8. Estimated rehab length of stay to reach the above functional goals is: 10 -12days 9. Does the patient have adequate social supports to accommodate these discharge functional goals? Potentially 10. Anticipated D/C setting: Home 11. Anticipated post D/C treatments: HH therapy 12. Overall Rehab/Functional Prognosis: good  RECOMMENDATIONS: This patient's condition is appropriate for continued rehabilitative care in the following setting: patient spiked a fever of 102 this morning. He will need to defervesce or have appropriate treatment prior to CIR admission. Patient has agreed to participate in recommended program. Yes Note that insurance prior authorization may be required for reimbursement for recommended care.  Comment:    08/14/2011

## 2011-08-14 NOTE — Progress Notes (Signed)
Subjective: 2 Days Post-Op Procedure(s) (LRB): ARTHROPLASTY BIPOLAR HIP (Left)   Patient reports pain as mild, pain well controlled. States that even the decreased activity since fracturing the hip has effected his muscle tone. No events throughout the night.  Objective:   VITALS:   Filed Vitals:   08/14/11 0751  BP:   Pulse:   Temp:   Resp: 18    Incision: dressing C/D/I No cellulitis present Compartment soft  LABS  Basename 08/14/11 0350 08/13/11 0403 08/11/11 1733  HGB 10.8* 12.0* 12.7*  HCT 32.4* 36.2* 37.9*  WBC 5.2 5.6 3.5*  PLT 203 212 172     Basename 08/13/11 0403 08/11/11 1733  NA 135 139  K 4.4 4.2  BUN 17 24*  CREATININE 0.67 0.94  GLUCOSE 116* 95     Assessment/Plan: 2 Days Post-Op Procedure(s) (LRB): ARTHROPLASTY BIPOLAR HIP (Left)   Up with therapy Plan for discharge tomorrow  Consult for CIR placed, pt has partial paralysis already and would benefit from the additional therapy.   Anastasio Auerbach Khani Paino   PAC  08/14/2011, 9:39 AM

## 2011-08-15 ENCOUNTER — Encounter (HOSPITAL_COMMUNITY): Payer: Self-pay | Admitting: *Deleted

## 2011-08-15 ENCOUNTER — Inpatient Hospital Stay (HOSPITAL_COMMUNITY)
Admission: AD | Admit: 2011-08-15 | Discharge: 2011-08-25 | DRG: 945 | Disposition: A | Discharge status: Skilled Nursing Facility | Payer: Medicare Other | Source: Ambulatory Visit | Attending: Physical Medicine & Rehabilitation | Admitting: Physical Medicine & Rehabilitation

## 2011-08-15 DIAGNOSIS — Z87891 Personal history of nicotine dependence: Secondary | ICD-10-CM

## 2011-08-15 DIAGNOSIS — N390 Urinary tract infection, site not specified: Secondary | ICD-10-CM

## 2011-08-15 DIAGNOSIS — J9819 Other pulmonary collapse: Secondary | ICD-10-CM

## 2011-08-15 DIAGNOSIS — Z880 Allergy status to penicillin: Secondary | ICD-10-CM

## 2011-08-15 DIAGNOSIS — X58XXXS Exposure to other specified factors, sequela: Secondary | ICD-10-CM

## 2011-08-15 DIAGNOSIS — W010XXA Fall on same level from slipping, tripping and stumbling without subsequent striking against object, initial encounter: Secondary | ICD-10-CM

## 2011-08-15 DIAGNOSIS — G8253 Quadriplegia, C5-C7 complete: Secondary | ICD-10-CM

## 2011-08-15 DIAGNOSIS — Z79899 Other long term (current) drug therapy: Secondary | ICD-10-CM

## 2011-08-15 DIAGNOSIS — S72009A Fracture of unspecified part of neck of unspecified femur, initial encounter for closed fracture: Secondary | ICD-10-CM

## 2011-08-15 DIAGNOSIS — K5900 Constipation, unspecified: Secondary | ICD-10-CM

## 2011-08-15 DIAGNOSIS — D62 Acute posthemorrhagic anemia: Secondary | ICD-10-CM

## 2011-08-15 DIAGNOSIS — Z5189 Encounter for other specified aftercare: Secondary | ICD-10-CM

## 2011-08-15 DIAGNOSIS — R259 Unspecified abnormal involuntary movements: Secondary | ICD-10-CM

## 2011-08-15 DIAGNOSIS — Z981 Arthrodesis status: Secondary | ICD-10-CM

## 2011-08-15 DIAGNOSIS — G8250 Quadriplegia, unspecified: Secondary | ICD-10-CM

## 2011-08-15 DIAGNOSIS — S72002A Fracture of unspecified part of neck of left femur, initial encounter for closed fracture: Secondary | ICD-10-CM

## 2011-08-15 DIAGNOSIS — B961 Klebsiella pneumoniae [K. pneumoniae] as the cause of diseases classified elsewhere: Secondary | ICD-10-CM

## 2011-08-15 DIAGNOSIS — S72033A Displaced midcervical fracture of unspecified femur, initial encounter for closed fracture: Secondary | ICD-10-CM

## 2011-08-15 DIAGNOSIS — IMO0002 Reserved for concepts with insufficient information to code with codable children: Secondary | ICD-10-CM

## 2011-08-15 DIAGNOSIS — Z96649 Presence of unspecified artificial hip joint: Secondary | ICD-10-CM

## 2011-08-15 LAB — URINALYSIS, ROUTINE W REFLEX MICROSCOPIC
Nitrite: NEGATIVE
Specific Gravity, Urine: 1.033 — ABNORMAL HIGH (ref 1.005–1.030)
Urobilinogen, UA: 1 mg/dL (ref 0.0–1.0)

## 2011-08-15 LAB — URINE MICROSCOPIC-ADD ON

## 2011-08-15 MED ORDER — POLYETHYLENE GLYCOL 3350 17 G PO PACK: 17.0000 g | PACK | Freq: Two times a day (BID) | ORAL | Status: AC

## 2011-08-15 MED ORDER — DSS 100 MG PO CAPS: 100.0000 mg | ORAL_CAPSULE | Freq: Two times a day (BID) | ORAL | Status: AC

## 2011-08-15 MED ORDER — PROCHLORPERAZINE EDISYLATE 5 MG/ML IJ SOLN
5.0000 mg | Freq: Four times a day (QID) | INTRAMUSCULAR | Status: DC | PRN
Start: 2011-08-15 — End: 2011-08-25
  Filled 2011-08-15: qty 2

## 2011-08-15 MED ORDER — TEMAZEPAM 7.5 MG PO CAPS: 15.0000 mg | ORAL_CAPSULE | Freq: Every evening | ORAL | Status: DC | PRN

## 2011-08-15 MED ORDER — BACLOFEN 10 MG PO TABS
10.0000 mg | ORAL_TABLET | Freq: Four times a day (QID) | ORAL | Status: DC
  Administered 2011-08-15 – 2011-08-25 (×40): 10 mg via ORAL
  Filled 2011-08-15 (×43): qty 1

## 2011-08-15 MED ORDER — ASPIRIN EC 325 MG PO TBEC: 325.0000 mg | DELAYED_RELEASE_TABLET | Freq: Two times a day (BID) | ORAL | Status: AC

## 2011-08-15 MED ORDER — DOCUSATE SODIUM 100 MG PO CAPS
100.0000 mg | ORAL_CAPSULE | Freq: Two times a day (BID) | ORAL | Status: DC
  Administered 2011-08-15 – 2011-08-25 (×20): 100 mg via ORAL
  Filled 2011-08-15 (×22): qty 1

## 2011-08-15 MED ORDER — PROCHLORPERAZINE MALEATE 5 MG PO TABS
5.0000 mg | ORAL_TABLET | Freq: Four times a day (QID) | ORAL | Status: DC | PRN
Start: 2011-08-15 — End: 2011-08-25
  Filled 2011-08-15: qty 2

## 2011-08-15 MED ORDER — GUAIFENESIN-DM 100-10 MG/5ML PO SYRP: 5.0000 mL | ORAL_SOLUTION | Freq: Four times a day (QID) | ORAL | Status: DC | PRN

## 2011-08-15 MED ORDER — METHOCARBAMOL 500 MG PO TABS: 500.0000 mg | ORAL_TABLET | Freq: Four times a day (QID) | ORAL | Status: DC | PRN

## 2011-08-15 MED ORDER — HYDROCODONE-ACETAMINOPHEN 5-325 MG PO TABS: 1.0000 | ORAL_TABLET | ORAL | Status: AC | PRN

## 2011-08-15 MED ORDER — BISACODYL 10 MG RE SUPP: 10.0000 mg | Freq: Every day | RECTAL | Status: DC | PRN

## 2011-08-15 MED ORDER — HYDROCODONE-ACETAMINOPHEN 5-325 MG PO TABS
1.0000 | ORAL_TABLET | ORAL | Status: DC | PRN
  Administered 2011-08-21: 2 via ORAL
  Filled 2011-08-15: qty 2

## 2011-08-15 MED ORDER — PHENOL 1.4 % MT LIQD: 1.0000 | OROMUCOSAL | Status: DC | PRN

## 2011-08-15 MED ORDER — SENNOSIDES-DOCUSATE SODIUM 8.6-50 MG PO TABS: 1.0000 | ORAL_TABLET | Freq: Every day | ORAL | Status: DC

## 2011-08-15 MED ORDER — MENTHOL 3 MG MT LOZG: 1.0000 | LOZENGE | OROMUCOSAL | Status: DC | PRN

## 2011-08-15 MED ORDER — ACETAMINOPHEN 325 MG PO TABS
325.0000 mg | ORAL_TABLET | ORAL | Status: DC | PRN
Start: 2011-08-15 — End: 2011-08-25

## 2011-08-15 MED ORDER — ENOXAPARIN SODIUM 40 MG/0.4ML ~~LOC~~ SOLN: 40.0000 mg | SUBCUTANEOUS | Status: DC

## 2011-08-15 MED ORDER — DIPHENHYDRAMINE HCL 12.5 MG/5ML PO ELIX: 12.5000 mg | ORAL_SOLUTION | ORAL | Status: DC | PRN

## 2011-08-15 MED ORDER — ENOXAPARIN SODIUM 40 MG/0.4ML ~~LOC~~ SOLN
40.0000 mg | SUBCUTANEOUS | Status: DC
  Administered 2011-08-16 – 2011-08-25 (×10): 40 mg via SUBCUTANEOUS
  Filled 2011-08-15 (×10): qty 0.4

## 2011-08-15 MED ORDER — SENNOSIDES-DOCUSATE SODIUM 8.6-50 MG PO TABS: 1.0000 | ORAL_TABLET | Freq: Every evening | ORAL | Status: DC | PRN

## 2011-08-15 MED ORDER — PROCHLORPERAZINE 25 MG RE SUPP
12.5000 mg | Freq: Four times a day (QID) | RECTAL | Status: DC | PRN
  Filled 2011-08-15: qty 1

## 2011-08-15 MED ORDER — MELOXICAM 15 MG PO TABS
15.0000 mg | ORAL_TABLET | Freq: Every day | ORAL | Status: DC
  Administered 2011-08-15: 15 mg via ORAL
  Filled 2011-08-15 (×6): qty 1

## 2011-08-15 MED ORDER — ALUM & MAG HYDROXIDE-SIMETH 200-200-20 MG/5ML PO SUSP: 30.0000 mL | ORAL | Status: DC | PRN

## 2011-08-15 MED ORDER — FLEET ENEMA 7-19 GM/118ML RE ENEM
1.0000 | ENEMA | Freq: Once | RECTAL | Status: AC | PRN
  Filled 2011-08-15: qty 1

## 2011-08-15 NOTE — Anesthesia Postprocedure Evaluation (Signed)
Anesthesia Post Note  Patient: Andrew Sullivan  Procedure(s) Performed: Procedure(s) (LRB): ARTHROPLASTY BIPOLAR HIP (Left)  Anesthesia type: General  Patient location: PACU  Post pain: Pain level controlled  Post assessment: Post-op Vital signs reviewed  Last Vitals:  Filed Vitals:   08/15/11 1107  BP:   Pulse:   Temp:   Resp: 16    Post vital signs: Reviewed  Level of consciousness: sedated  Complications: No apparent anesthesia complications

## 2011-08-15 NOTE — Evaluation (Signed)
Physical Therapy Assessment and Plan  Patient Details  Name: Andrew Sullivan MRN: [id number] Date of Birth: Mar 16, 1970  PT Diagnosis: Abnormal posture, Difficulty walking and Muscle weakness Rehab Potential: Good ELOS: 2-3 weeks   Today's Date: 08/15/2011 Time: 4098-1191 Time Calculation (min): 35 min  Problem List:  Patient Active Problem List  Diagnoses  . hemiparesis  . HEMATURIA  . VERTEBRAL FRACTURE, CERVICAL SPINE  . Leg pain, bilateral  . History of recurrent UTIs  . Irritant contact dermatitis  . Abscess of axilla, left  . Hip fracture, left    Past Medical History:  Past Medical History  Diagnosis Date  . Cervical spine fracture     C4-C6 with fusion  . Paraparesis of both lower limbs   . History of recurrent UTIs   . Erectile dysfunction    Past Surgical History:  Past Surgical History  Procedure Date  . Cervical fusion   . Hip arthroplasty 08/12/2011    Procedure: ARTHROPLASTY BIPOLAR HIP;  Surgeon: Shelda Pal, MD;  Location: [facility];  Service: Orthopedics;  Laterality: Left;  Hemi-arthroplasty    Assessment & Plan Clinical Impression: Patient is a  41 y.o. male with history of cervical spine injury with myelopathy and paraparesis, who tripped and fell four days PTA 06/03 with acute onset of hip pain and inability to walk.. X rays done left impacted mid-cervical femur fracture, minimally comminuted. Patient was evaluated by Dr. Charlann Boxer and underwent left hip hemi on 06/04.  Patient transferred to CIR on 08/15/2011 .   Patient currently requires min with mobility secondary to muscle weakness and muscle joint tightness and abnormal tone.  Prior to hospitalization, patient was mod I with mobility and lived with Family in a House home.  Home access is  Level entry.  Patient will benefit from skilled PT intervention to maximize safe functional mobility, minimize fall risk and decrease caregiver burden for planned discharge home with intermittent assist.  Anticipate  patient will benefit from follow up OP at discharge.  PT - End of Session Activity Tolerance: Tolerates 10 - 20 min activity with multiple rests PT Assessment Rehab Potential: Good PT Plan PT Frequency: 1-2 X/day, 60-90 minutes Estimated Length of Stay: 2-3 weeks PT Treatment/Interventions: Ambulation/gait training;Community reintegration;Discharge planning;Functional mobility training;Therapeutic Activities;Patient/family education;Pain management;DME/adaptive equipment instruction;Neuromuscular re-education;Therapeutic Exercise;UE/LE Strength taining/ROM;UE/LE Coordination activities;Wheelchair propulsion/positioning;Stair training;Splinting/orthotics;Balance/vestibular training PT Recommendation Follow Up Recommendations: Outpatient PT  PT Evaluation Precautions/Restrictions Precautions Precautions: Posterior Hip;Back Restrictions Weight Bearing Restrictions: Yes LLE Weight Bearing: Weight bearing as tolerated Pain Pain Assessment Pain Assessment: No/denies pain Pain Score: 0-No pain Home Living/Prior Functioning Home Living Lives With: Family Available Help at Discharge: Family Type of Home: House Home Access: Level entry Home Layout: One level Home Adaptive Equipment: Crutches Prior Function Level of Independence: Requires assistive device for independence (crutches) Driving: Yes  Cognition Overall Cognitive Status: Appears within functional limits for tasks assessed Sensation Sensation Light Touch: Appears Intact Coordination Gross Motor Movements are Fluid and Coordinated: Yes Motor  Motor Motor: Abnormal tone Motor - Skilled Clinical Observations: increased tone B LEs  Mobility Transfers Sit to Stand: 1: +2 Total assist;From elevated surface Sit to Stand Details (indicate cue type and reason): to RW with cues for hand placement, lifting assistance, assist to block B knees from buckling Stand to Sit: 2: Max assist Stand to Sit Details: cues for hand  placement Lateral/Scoot Transfers: 4: Min assist Lateral/Scoot Transfer Details (indicate cue type and reason): assist for wt shift when fatigued Locomotion  Ambulation Ambulation:  No (unable on eval due to decreased strength and wt LEs) Stairs / Additional Locomotion Stairs: No Wheelchair Mobility Wheelchair Mobility: Yes Wheelchair Assistance: 6: Modified independent (Device/Increase time) (mod I controlled, supervision household) Occupational hygienist: Both upper extremities Wheelchair Parts Management: Needs assistance;Supervision/cueing Distance: 150'  Trunk/Postural Assessment  Cervical Assessment Cervical Assessment: Within Functional Limits Thoracic Assessment Thoracic Assessment: Within Functional Limits Lumbar Assessment Lumbar Assessment:  (fwd flexed posture in standing) Postural Control Postural Control:  (generalized trunk weakness)  Balance Static Sitting Balance Static Sitting - Level of Assistance: 6: Modified independent (Device/Increase time) Dynamic Sitting Balance Dynamic Sitting - Level of Assistance: 5: Stand by assistance (cues for hip precautions) Static Standing Balance Static Standing - Level of Assistance: 1: +2 Total assist (heavy wt on UEs, cues for wt on LEs) Extremity Assessment      RLE Strength RLE Overall Strength: Deficits (grossly 2/5 hip and knee, 1/5 ankle) RLE Tone RLE Tone Comments: increased tone making stretching into abduction, full knee flexion/extension difficult LLE Assessment LLE Assessment:  (ROM limited by tone, hip precautions) LLE Strength LLE Overall Strength:  (2-/5 hip and knee, trace in ankle) LLE Tone LLE Tone: Mild;Moderate (makes abduction and knee flex/ext difficult)  See FIM for current functional status Refer to Care Plan for Long Term Goals  Recommendations for other services: None  Discharge Criteria: Patient will be discharged from PT if patient refuses treatment 3 consecutive times without medical  reason, if treatment goals not met, if there is a change in medical status, if patient makes no progress towards goals or if patient is discharged from hospital.  The above assessment, treatment plan, treatment alternatives and goals were discussed and mutually agreed upon: by patient  [facility] 08/15/2011, 3:45 PM

## 2011-08-15 NOTE — Progress Notes (Signed)
Overall Plan of Care [facility]) Patient Details Name: Andrew Sullivan MRN: [id number] DOB: Apr 24, 1970  Diagnosis:  Rehabilitation for femoral neck fracture  Primary Diagnosis:    Hip fracture, left Co-morbidities: Cervical spine fracture  C4-C6 with fusion  .  Paraparesis of both lower limbs  .  History of recurrent UTIs  .  Erectile dysfunction    Functional Problem List  Patient demonstrates impairments in the following areas: Balance, Endurance, Motor and Safety  Basic ADL's: grooming, bathing, dressing and toileting Advanced ADL's: laundry  Transfers:  bed mobility, bed to chair, car and furniture Locomotion:  ambulation and wheelchair mobility  Additional Impairments:  Leisure Awareness  Anticipated Outcomes Item Anticipated Outcome  Eating/Swallowing    Basic self-care   supervision with LB dressing:  Mod I with everything else  Tolieting  supervision  Bowel/Bladder    Transfers  Mod I  Locomotion  Mod I w/c, min A gait  Communication    Cognition    Pain    Safety/Judgment    Other     Therapy Plan: PT Frequency: 1-2 X/day, 60-90 minutes OT Frequency: 1-2 X/day, 60-90 minutes     Team Interventions: Item RN PT OT SLP SW TR Other  Self Care/Advanced ADL Retraining   x      Neuromuscular Re-Education  x x      Therapeutic Activities  x x      UE/LE Strength Training/ROM  x x      UE/LE Coordination Activities  x x      Visual/Perceptual Remediation/Compensation         DME/Adaptive Equipment Instruction  x x      Therapeutic Exercise  x x      Balance/Vestibular Training  x x      Patient/Family Education  x x      Cognitive Remediation/Compensation         Functional Mobility Training  x x      Ambulation/Gait Training  x       Museum/gallery curator  x       Wheelchair Propulsion/Positioning  x x      Functional Musician   x      Dysphagia/Aspiration Musician         Bladder Management         Bowel Management         Disease Management/Prevention         Pain Management  x       Medication Management         Skin Care/Wound Management         Splinting/Orthotics         Discharge Planning  x x      Psychosocial Support                            Team Discharge Planning: Destination:  Home Projected Follow-up:  PT, OT and Outpatient Projected Equipment Needs:  Tub Bench Patient/family involved in discharge planning:  Yes  MD ELOS: 7-10days Medical Rehab Prognosis:  Good Assessment: 41 yo male with incomplete cervical spinal cord injury fell and sustained a L femoral neck fx, L hip bipolar hemiarthroplasty 6/4.  Now requires CIR level PT,OT, rehab MD and RN

## 2011-08-15 NOTE — Progress Notes (Signed)
Pt. Arrived to rehab unit from [facility] 1612 (ortho) and admitted to 4006.  Pt AOX4 and oriented to rehab.  All VSS; pt. Reports no pain upon admission.

## 2011-08-15 NOTE — Progress Notes (Signed)
Pt out of dept with [facility].  dph

## 2011-08-15 NOTE — PMR Pre-admission (Signed)
PMR Admission Coordinator Pre-Admission Assessment  Patient: Andrew Sullivan is an 41 y.o., male MRN: [id number] DOB: [birth year] Height: 6\' 3"  (190.5 cm) Weight: 78.4 kg (172 lb 13.5 oz)  Insurance Information HMO:    PPO:      PCP:      IPA:      80/20:      OTHER: x PRIMARY: Medicaid      Policy#: [id number]      Subscriber: patient CM Name:       Phone#:      Fax#:  Pre-Cert#:    Employer: Disabled Benefits:  Phone #: [phone]     Name:  Eff. Date: 09/14/11     Deduct:       Out of Pocket Max:       Life Max:  CIR:       SNF:  Outpatient:      Co-Pay:  Home Health:       Co-Pay:  DME:      Co-Pay:  Providers:  SECONDARY:       Policy#:       Subscriber:  CM Name:       Phone#:      Fax#:  Pre-Cert#:       Employer:  Benefits:  Phone #:      Name:  Eff. Date:     Deduct:       Out of Pocket Max:       Life Max:  CIR:       SNF:  Outpatient:      Co-Pay:  Home Health:       Co-Pay:  DME:      Co-Pay:   Medicaid Application Date:       Case Manager:  Disability Application Date:       Case Worker:   Emergency Contact Information Contact Information    Name Relation Home Work Mobile   Besson,Barbara Mother [phone]       Current Medical History  Patient Admitting Diagnosis: left femoral neck fracture status post bipolar hemiarthroplasty in a patient with chronic incomplete cervical spinal cord injury with quadriparesis  History of Present IllnessFrederick D Sullivan is a 41 y.o. male with history of cervical spine injury with myelopathy and paraparesis, who tripped and fell four days PTA 06/03 with acute onset of hip pain and inability to walk.. X rays done left impacted mid-cervical femur fracture, minimally comminuted. Patient was evaluated by Dr. Charlann Boxer and underwent left hip hemi on 06/04. Post op WBAT and on lovenox for DVT prophylaxis. Post op has had worsening of spasticity. PT/OT evaluations done and patient limited by quadriparesis and tone affecting  mobility. PT, OT, MD recommending CIR. :       Past Medical History  Past Medical History  Diagnosis Date  . Cervical spine fracture     C4-C6 with fusion  . Paraparesis of both lower limbs   . History of recurrent UTIs   . Erectile dysfunction     Family History  family history is not on file.  Prior Rehab/Hospitalizations: CIR in 1988 with cervical fracture    Current Medications  Current facility-administered medications:acetaminophen (TYLENOL) suppository 650 mg, 650 mg, Rectal, Q6H PRN, Shelda Pal, MD;  acetaminophen (TYLENOL) tablet 650 mg, 650 mg, Oral, Q6H PRN, Shelda Pal, MD, 650 mg at 08/14/11 2039;  baclofen (LIORESAL) tablet 10 mg, 10 mg, Oral, TID, Shelda Pal, MD, 10 mg at 08/15/11 0902;  diphenhydrAMINE (BENADRYL) 12.5 MG/5ML  elixir 12.5-25 mg, 12.5-25 mg, Oral, Q4H PRN, Shelda Pal, MD docusate sodium (COLACE) capsule 100 mg, 100 mg, Oral, BID, Shelda Pal, MD, 100 mg at 08/14/11 2218;  enoxaparin (LOVENOX) injection 40 mg, 40 mg, Subcutaneous, Q24H, Shelda Pal, MD, 40 mg at 08/14/11 1100;  HYDROcodone-acetaminophen (NORCO) 5-325 MG per tablet 1-2 tablet, 1-2 tablet, Oral, Q4H PRN, Shelda Pal, MD;  HYDROmorphone (DILAUDID) injection 0.5-1 mg, 0.5-1 mg, Intravenous, Q2H PRN, Shelda Pal, MD meloxicam [facility]) tablet 15 mg, 15 mg, Oral, Daily, Shelda Pal, MD, 15 mg at 08/15/11 0902;  menthol-cetylpyridinium (CEPACOL) lozenge 3 mg, 1 lozenge, Oral, PRN, Shelda Pal, MD;  metoCLOPramide (REGLAN) injection 5-10 mg, 5-10 mg, Intravenous, Q8H PRN, Shelda Pal, MD;  metoCLOPramide (REGLAN) tablet 5-10 mg, 5-10 mg, Oral, Q8H PRN, Shelda Pal, MD;  ondansetron [facility]) injection 4 mg, 4 mg, Intravenous, Q6H PRN, Shelda Pal, MD ondansetron [facility]) tablet 4 mg, 4 mg, Oral, Q6H PRN, Shelda Pal, MD;  phenol (CHLORASEPTIC) mouth spray 1 spray, 1 spray, Mouth/Throat, PRN, Shelda Pal, MD;  polyethylene glycol (MIRALAX / GLYCOLAX) packet 17  g, 17 g, Oral, BID, Shelda Pal, MD;  temazepam (RESTORIL) capsule 15 mg, 15 mg, Oral, QHS PRN, Kirtland Bouchard, PA  Patients Current Diet: General  Precautions / Restrictions Precautions Precautions: Posterior Hip Precautions/Special Needs: Hip/Back Precaution Comments: siogn hung in room Restrictions Weight Bearing Restrictions: Yes Other Position/Activity Restrictions: WBAT   Prior Activity Level Community (5-7x/wk): reports he was active in [employer] / Equipment Home Assistive Devices/Equipment: Crutches;Shower chair with back;Wheelchair  Prior Functional Level Prior Function Level of Independence: Independent with assistive device(s) Able to Take Stairs?: No Driving: Yes Vocation: On disability  Current Functional Level Cognition  Arousal/Alertness: Awake/alert Overall Cognitive Status: Appears within functional limits for tasks assessed/performed Orientation Level: Oriented X4    Extremity Assessment (includes Sensation/Coordination)  RUE ROM/Strength/Tone: Deficits RUE ROM/Strength/Tone Deficits: L hand strength 3+/5 RUE Sensation: WFL - Light Touch  RLE ROM/Strength/Tone: Deficits RLE ROM/Strength/Tone Deficits: AAROM WFL with 2-/5  strength and abnormal tone RLE Sensation: WFL - Light Touch RLE Coordination: Deficits RLE Coordination Deficits: abnormal tone    ADLs  Eating/Feeding: Simulated;Independent Where Assessed - Eating/Feeding: Edge of bed Grooming: Simulated;Set up Where Assessed - Grooming: Unsupported sitting Upper Body Bathing: Simulated;Set up Where Assessed - Upper Body Bathing: Unsupported sitting Lower Body Bathing: Simulated;+2 Total assistance (pt 70% with AE) Where Assessed - Lower Body Bathing: Supported sit to stand Upper Body Dressing: Simulated;Minimal assistance (iv) Where Assessed - Upper Body Dressing: Unsupported sitting Lower Body Dressing: Simulated;+2 Total assistance (with AE) Lower Body Dressing:  Patient Percentage: 30% Where Assessed - Lower Body Dressing: Sopported sit to stand Toileting - Clothing Manipulation and Hygiene: Simulated;+2 Total assistance Toileting - Clothing Manipulation and Hygiene: Patient Percentage: 20% Where Assessed - Toileting Clothing Manipulation and Hygiene: Sit to stand from 3-in-1 or toilet Equipment Used: Reacher;Sock aid Transfers/Ambulation Related to ADLs: worked on sit to stand only:  cotx with PT.  3rd person used to block knees ADL Comments: used sock aid at EOB on RLE:  leg came up with pulliing.  Will need to sit greater than 90 degrees for safety. Pt unable to lift RLE at this time for ADLs.      Mobility  Bed Mobility: Supine to Sit Supine to Sit: 4: Min assist;3: Mod assist Supine to Sit: Patient Percentage: 80%    Transfers  Transfers: Sit to  Stand;Stand to Sit Sit to Stand: 1: +2 Total assist Sit to Stand: Patient Percentage: 60% Stand to Sit: 1: +2 Total assist Stand to Sit: Patient Percentage: 60% Stand Pivot Transfers: 1: +2 Total assist Stand Pivot Transfers: Patient Percentage: 40% Lateral/Scoot Transfers: 1: +2 Total assist Lateral Transfers: Patient Percentage: 80%    Ambulation / Gait / Stairs / Engineer, drilling / Balance       Previous Home Environment Living Arrangements: Parent Lives With: Family Available Help at Discharge: Family Type of Home: House Home Layout: One level Home Access: Level entry Bathroom Shower/Tub: Engineer, manufacturing systems: Standard Bathroom Accessibility: Yes How Accessible: Accessible via walker;Accessible via wheelchair Home Care Services: No  Discharge Living Setting Plans for Discharge Living Setting: House;Lives with (comment) (mother and step father) Type of Home at Discharge: House Discharge Home Layout: One level Discharge Home Access: Level entry Discharge Bathroom Shower/Tub: Tub/shower unit Discharge Bathroom Toilet: Standard Discharge Bathroom  Accessibility: Yes How Accessible: Accessible via wheelchair;Accessible via walker Do you have any problems obtaining your medications?: No  Social/Family/Support Systems Patient Roles:  (son) Contact Information: home [phone] Anticipated Caregiver: mother Giulian Goldring Anticipated Caregiver's Contact Information: [phone] Ability/Limitations of Caregiver: works Engineer, structural Availability: Intermittent Discharge Plan Discussed with Primary Caregiver: No (patient states, I do not need to contact mother) Is Caregiver In Agreement with Plan?:  (did not discuss with mother) Does Caregiver/Family have Issues with Lodging/Transportation while Pt is in Rehab?: No  Goals/Additional Needs Patient/Family Goal for Rehab: S-min A PT; S-min A w/c OT; SLP NA Expected length of stay: 10-12 days Cultural Considerations: none Dietary Needs: regular Equipment Needs: to be determined Pt/Family Agrees to Admission and willing to participate: Yes Program Orientation Provided & Reviewed with Pt/Caregiver Including Roles  & Responsibilities: Yes  Patient Condition: This patient's condition remains as documented in the Consult dated 08/14/11 @1337 , in which the Rehabilitation Physician determined and documented that the patient's condition is appropriate for intensive rehabilitative care in an inpatient rehabilitation facility.  Preadmission Screen Completed By:  Oletta Darter, 08/15/2011 9:13 AM ______________________________________________________________________   Discussed status with Dr. Riley Kill on 08/15/11  at 1337 and received telephone approval for admission today.  Admission Coordinator:  Oletta Darter, time0915/Date6/7/13

## 2011-08-15 NOTE — Discharge Instructions (Signed)
Femur Fracture A femur fracture is a complete or incomplete break in the thighbone (femur). This is a serious injury, but is uncommon in sports. Usually the ankle, lower leg, or knee will become injured before the thighbone does.  SYMPTOMS   Severe pain in the thigh, at the time of injury.   Tenderness and inflammation in the thigh.   Bleeding and bruising in the thigh.   Inability to bear weight on the injured leg.   Visible deformity, if the fracture is complete and bone fragments separate enough to distort the leg shape.   Numbness and coldness in the leg and foot, beyond the fracture site, if blood supply is impaired.  CAUSES   A fracture results when the force applied to a bone is greater that the bone can withstand. Thighbone fractures often result from a direct hit (trauma).   Indirect stress, caused by twisting or violent muscle contraction.  RISK INCREASES WITH:   Contact sports (i.e. football, soccer, hockey), motor sports, and track and field events.   Previous or current bone problems (i.e. osteoporosis, tumors).   Metabolism disorders or hormone problems.   Nutrition deficiency or disorder (i.e. anorexia and bulimia).   Poor strength and flexibility.  PREVENTION   Warm up and stretch properly before activity.   Maintain physical fitness:   Muscle strength.   Endurance and flexibility.   Cardiovascular fitness.   Wear proper protective equipment (i.e. thigh pads for football or hockey).  PROGNOSIS  This condition can often be cured with proper treatment, though it may take 6 to 8 weeks to heal.  RELATED COMPLICATIONS   Low blood volume (hypovolemic) shock, due to blood loss in the thigh.   Failure of bone to heal (nonunion).   Bone heals in a poor position (malunion).   Increased pressure inside the leg(compartment syndrome), due to injury that disrupts blood supply to the leg and foot and injures the nerves and muscles of the leg and foot (uncommon).    Shortening of the injured bones.   Increased chance of repeated leg injury.   Stiff hip or knee.   Hindrance of normal bone growth in children.   Risks of surgery: infection, bleeding, injury to nerves (numbness, weakness, paralysis), need for further surgery.   Infection of open fractures (skin broken over fracture site).   Bone forming within the muscle (myositis ossificans).   Longer healing time, if activity is resumed too soon.  TREATMENT  Treatment first involves the use of ice and medicine to reduce pain and inflammation. Treatment of thighbone fractures often requires surgery, to allow the bone to heal in proper alignment, and to reduce the risk of possible complications. Surgery often involves placing a metal rod down the center of the bone, or fixing plates and screws over the fracture line. Use of a cast is not common, because the cast would need to involve the stomach, low back, pelvis, and extend to the foot. For adults, traction (applying pressure using a device) is not often advised, due to the need for prolonged bed rest (6 to 8 weeks). In certain cases, bone growth stimulators may be advised. After the bone heals (with or without surgery), stretching and strengthening exercise is needed. Exercises may be done at home or with a therapist. The rod, plate, and screws from surgery are only removed if they cause further discomfort.  MEDICATION   If pain medicine is needed, nonsteroidal anti-inflammatory medicines (aspirin and ibuprofen), or other minor pain relievers (acetaminophen), are  often advised.   Do not take pain medicine for 7 days before surgery.   Stronger pain relievers may be prescribed by your caregiver. Use only as directed and only as much as you need.  SEEK MEDICAL CARE IF:   Symptoms get worse or do not improve in 2 weeks, despite treatment.   The following occur after restraint or surgery. (Report any of these signs immediately):   Swelling above or  below the fracture site.   Severe, persistent pain.   Blue or gray skin below the fracture site, especially under the toenails. Numbness or loss of feeling below the fracture site.   New, unexplained symptoms develop. (Drugs used in treatment may produce side effects.)  Document Released: 02/24/2005 Document Revised: 02/13/2011 Document Reviewed: 06/08/2008 [facility] Patient Information 2012 [place], [place].Partial Hip Replacement The hip joint attaches the leg to the pelvis. The joint moves and supports you when you walk. The top of the thighbone (femoral head) is in the shape of a ball. Part of the lower pelvis (acetabulum) forms a cup-shaped socket. The ball and socket are attached with bands called ligaments. Smooth tissue (cartilage) lines the surface of the ball and socket to keep the joint cushioned and to help it move. Lubricating fluid (synovial fluid) also covers joint surfaces to keep the joint healthy. A partial hip replacement most often refers to a surgery to replace only the ball part of the joint. A man-made metal material (prosthesis) replaces the worn femoral head. This surgery is often performed to alleviate the pain of arthritis or a break (fracture). It is most commonly performed for a fractured hip. LET YOUR CAREGIVER KNOW ABOUT:   Allergies to food or medicine.   Medicines taken, including vitamins, herbs, eyedrops, over-the-counter medicines, and creams.   Use of steroids (by mouth or creams).   Previous problems with anesthetics or numbing medicines.   History of bleeding problems or blood clots.   Previous surgery.   Other health problems, including diabetes and kidney problems.   Possibility of pregnancy, if this applies.  RISKS AND COMPLICATIONS  As with any surgery, complications may occur. However, they can usually be managed by your caregiver. General surgical complications may include:  Reaction to anesthesia.   Damage to surrounding nerves, tissues, or  structures.   Infection.   Blood clot.   Bleeding.   Scarring.  Complications related to this surgery may include:  Prosthesis not staying attached to the bone.   Joint dislocation.   Continued stiffness or loss of mobility.   Continued pain.  BEFORE THE PROCEDURE  It is important to follow your caregiver's instructions prior to your surgery to avoid complications. Steps before your surgery may include:  Physical exam, blood and urine tests, X-rays, a computerized magnetic scan (MRI), bone scans, or heart tests (cardiogram or chest X-ray).   Your caregiver will review with you the surgery, the anesthesia being used, and what to expect after the surgery.  You may be asked to:  Stop taking certain medicines for several days prior to your surgery, such as blood thinners (including aspirin). Your caregiver will let you know which medicines you may continue.   Lose weight.   Seek treatment for any dental conditions.   Avoid eating and drinking at least 8 hours before the surgery. This will help you avoid complications from anesthesia.   Take an antibacterial shower the night before or the morning of the surgery.   Quit smoking, if this applies. Smoking increases the chances  of a healing problem after your surgery. If you are thinking about quitting, ask your surgeon how long before the surgery you should stop smoking. Ask your primary caregiver about approaches to help you stop. This can include medicines.   Arrange for someone to help you with activities during recovery. Talk with your caregiver about the need for extended care in a facility.  PROCEDURE  The surgery is done after you are given medicine that makes you sleep (general anesthetic) or medicine that makes you numb from the waist down (spinal anesthetic). You will be asleep and will not feel any pain. The surgeon will make a cut (incision) in the hip to access the joint. The top of the thighbone that contains the ball is  removed. The surgeon secures a new prosthetic ball joint into the healthy socket. The nearby bone may grow into the prosthesis to hold it in place, or cement (methacrylate) may be used. The incision is closed with stitches (sutures). The surgery will take several hours to complete. AFTER THE PROCEDURE   You will most likely be in the hospital for 2 to 4 days following surgery.   You will need to lie in bed and only move as directed.   Caregivers will help you manage pain and swelling with medicines.   You will be asked to perform breathing exercises.   A splint or brace may be applied to the hip.   You will learn specific exercises and how to walk with support. This happens within hours after surgery or the following day. A physical therapist will help you.   You will need to follow your caregiver's instructions for home care after surgery.   Recovery generally takes 3 to 6 months.   You may be asked to limit how often you bend at the waist for 6 weeks.  Document Released: 08/14/2009 Document Revised: 02/13/2011 Document Reviewed: 08/14/2009 [facility] Patient Information 2012 [place], [place].

## 2011-08-15 NOTE — H&P (Signed)
Physical Medicine and Rehabilitation Admission H&P  Chief Complaint   Patient presents with   .  Hip Pain     Left With inability to walk   :  HPI: Andrew Sullivan is a 41 y.o. male with history of cervical spine injury with myelopathy and paraparesis, who tripped and fell four days PTA 06/03 with acute onset of hip pain and inability to walk.. X rays done left impacted mid-cervical femur fracture, minimally comminuted. Patient was evaluated by Dr. Charlann Sullivan and underwent left hip hemi on 06/04. Post op WBAT and on lovenox for DVT prophylaxis. Post op has had worsening of spasticity and problems with constipation. Evaluated by therapy team ane we felt that he would benefit from CIR program.  Review of Systems  HENT: Negative for hearing loss.  Eyes: Negative for blurred vision and double vision.  Respiratory: Negative for cough and shortness of breath.  Cardiovascular: Negative for chest pain and palpitations.  Gastrointestinal: Negative for diarrhea and constipation.  Genitourinary: Negative for dysuria, urgency and frequency.  Musculoskeletal: Positive for myalgias and joint pain.  Neurological: Negative for headaches.  Psychiatric/Behavioral: The patient has insomnia.   Past Medical History   Diagnosis  Date   .  Cervical spine fracture      C4-C6 with fusion   .  Paraparesis of both lower limbs    .  History of recurrent UTIs    .  Erectile dysfunction     Past Surgical History   Procedure  Date   .  Cervical fusion    .  Hip arthroplasty  08/12/2011     Procedure: ARTHROPLASTY BIPOLAR HIP; Surgeon: Shelda Pal, MD; Location: [facility]; Service: Orthopedics; Laterality: Left; Hemi-arthroplasty    Family history: negative for CAD, DM, cancer,CVA, or bleeding problems.  Social History: Lives with mother and step father. Independent with crutches for mobility. He reports that he has quit smoking. He has never used smokeless tobacco. He reports that he does not drink alcohol or use  illicit drugs.  Allergies   Allergen  Reactions   .  Penicillins      REACTION: itching and rash    Medications Prior to Admission   Medication  Sig  Dispense  Refill   .  baclofen (LIORESAL) 10 MG tablet  Take 1 tablet (10 mg total) by mouth 3 (three) times daily.  90 tablet  6   .  DISCONTD: meloxicam (MOBIC) 15 MG tablet  Take 15 mg by mouth daily.      Home:  Home Living  Lives With: Family  Available Help at Discharge: Family  Type of Home: House  Home Access: Level entry  Home Layout: One level  Bathroom Shower/Tub: Medical sales representative: Standard  Bathroom Accessibility: Yes  How Accessible: Accessible via walker;Accessible via wheelchair  Functional History:  Prior Function  Able to Take Stairs?: No  Driving: Yes  Vocation: On disability  Functional Status:  Mobility:  Bed Mobility  Bed Mobility: Supine to Sit  Supine to Sit: 4: Min assist;3: Mod assist  Supine to Sit: Patient Percentage: 80%  Transfers  Transfers: Sit to Stand;Stand to Sit  Sit to Stand: 1: +2 Total assist  Sit to Stand: Patient Percentage: 60%  Stand to Sit: 1: +2 Total assist  Stand to Sit: Patient Percentage: 60%  Stand Pivot Transfers: 1: +2 Total assist  Stand Pivot Transfers: Patient Percentage: 40%  Lateral/Scoot Transfers: 1: +2 Total assist  Lateral Transfers: Patient Percentage: 80%  ADL:  ADL  Eating/Feeding: Simulated;Independent  Where Assessed - Eating/Feeding: Edge of bed  Grooming: Simulated;Set up  Where Assessed - Grooming: Unsupported sitting  Upper Body Bathing: Simulated;Set up  Where Assessed - Upper Body Bathing: Unsupported sitting  Lower Body Bathing: Simulated;+2 Total assistance (pt 70% with AE)  Where Assessed - Lower Body Bathing: Supported sit to stand  Upper Body Dressing: Simulated;Minimal assistance (iv)  Where Assessed - Upper Body Dressing: Unsupported sitting  Lower Body Dressing: Simulated;+2 Total assistance (with AE)  Where Assessed  - Lower Body Dressing: Sopported sit to stand  Equipment Used: Reacher;Sock aid  Transfers/Ambulation Related to ADLs: worked on sit to stand only: cotx with PT. 3rd person used to block knees  ADL Comments: used sock aid at EOB on RLE: leg came up with pulliing. Will need to sit greater than 90 degrees for safety. Pt unable to lift RLE at this time for ADLs.  Cognition:  Cognition  Arousal/Alertness: Awake/alert  Orientation Level: Oriented X4  Cognition  Overall Cognitive Status: Appears within functional limits for tasks assessed/performed  Arousal/Alertness: Awake/alert  Orientation Level: Appears intact for tasks assessed  Behavior During Session: [facility] for tasks performed  Blood pressure 121/69, pulse 68, temperature 99.7 F (37.6 C), temperature source Oral, resp. rate 16, height 6\' 3"  (1.905 m), weight 78.4 kg (172 lb 13.5 oz), SpO2 97.00%.  Physical Exam  Nursing note and vitals reviewed.  Constitutional: He is oriented to person, place, and time. He appears well-developed and well-nourished.  HENT:  Head: Normocephalic and atraumatic.  Eyes: Pupils are equal, round, and reactive to light. eomi. Neck: Normal range of motion.  Cardiovascular: Normal rate and regular rhythm. No murmurs, rubs, or gallops Pulmonary/Chest: Effort normal and breath sounds normal. No wheezes rales or rhonchi Abdominal: Soft. Bowel sounds are normal.  Musculoskeletal: He exhibits no edema and no tenderness.  Neurological: He is alert and oriented to person, place, and time.normal insight, awareness, memory. He displays abnormal reflex. He exhibits abnormal muscle tone. With increased tone noted throughout both lower extremiteis (grossly 2-3). DTR's are 3+ in all 4. Strength is grossly 2/5 in the lower extremities. HI are 3/5.  The remainder or his UE's are 4/5. He denies sensory loss. Skin: Skin is warm and dry. Wound is intact  Results for orders placed during the hospital encounter of 08/11/11 (from the  past 48 hour(s))   CBC Status: Abnormal    Collection Time    08/14/11 3:50 AM   Component  Value  Range  Comment    WBC  5.2  4.0 - 10.5 (K/uL)     RBC  3.59 (*)  4.22 - 5.81 (MIL/uL)     Hemoglobin  10.8 (*)  13.0 - 17.0 (g/dL)     HCT  16.1 (*)  09.6 - 52.0 (%)     MCV  90.3  78.0 - 100.0 (fL)     MCH  30.1  26.0 - 34.0 (pg)     MCHC  33.3  30.0 - 36.0 (g/dL)     RDW  04.5  40.9 - 15.5 (%)     Platelets  203  150 - 400 (K/uL)     No results found.  Post Admission Physician Evaluation:  1. Functional deficits secondary to left femoral neck fx s/p left hip hemi. 2. Patient is admitted to receive collaborative, interdisciplinary care between the physiatrist, rehab nursing staff, and therapy team. 3. Patient's level of medical complexity and substantial therapy needs in context  of that medical necessity cannot be provided at a lesser intensity of care such as a SNF. 4. Patient has experienced substantial functional loss from his/her baseline which was documented above under the "Functional History" and "Functional Status" headings. Judging by the patient's diagnosis, physical exam, and functional history, the patient has potential for functional progress which will result in measurable gains while on inpatient rehab. These gains will be of substantial and practical use upon discharge in facilitating mobility and self-care at the household level. 5. Physiatrist will provide 24 hour management of medical needs as well as oversight of the therapy plan/treatment and provide guidance as appropriate regarding the interaction of the two. 6. 24 hour rehab nursing will assist with bladder management, bowel management, safety, skin/wound care, disease management, medication administration, pain management and patient education and help integrate therapy concepts, techniques,education, etc. 7. PT will assess and treat for: fxnl mobility, safety, adaptive equipment, pain. Goals are: mod I. 8. OT will assess  and treat for: UES, fxnl mobility, ADL's, safety, pain, AET. Goals are: mod I to supervision. 9. SLP will assess and treat for n/a. 10. Case Management and Social Worker will assess and treat for psychological issues and discharge planning. 11. Team conference will be held weekly to assess progress toward goals and to determine barriers to discharge. 12. Patient will receive at least 3 hours of therapy per day at least 5 days per week. 13. ELOS: 2 + weeks Prognosis: excellent  This patient will need education regarding safety, appropriate equipment, technique, etc. He has fallen multiple times at home. He has done remarkably well, all considering, so he may be resistant to suggestion in these areas.  Medical Problem List and Plan:  1. DVT Prophylaxis/Anticoagulation: Pharmaceutical: Lovenox  2. Pain Management: reasonable.  3. Mood: motivated to get better.  4. FUO: Likely due to atelectasis. Encourage IS q 1 hour. Will check UA/UC as he has history of recurrent UTIs.  5. ABLA: add iron supplement.  6. Spasticity: will increase baclofen to help with exacerbation of symptoms.  7. Contipation: resolved. Will schedule senna on prn basis  Ivory Broad, MD

## 2011-08-15 NOTE — Progress Notes (Signed)
Subjective: 3 Days Post-Op Procedure(s) (LRB): ARTHROPLASTY BIPOLAR HIP (Left)   Patient reports pain as mild, pain well controlled with medication. No events throughout the night. Ready to be discharged to rehab.  Objective:   VITALS:   Filed Vitals:   08/15/11 0611  BP: 121/69  Pulse: 68  Temp: 99.7 F (37.6 C)  Resp: 16    Incision: dressing C/D/I No cellulitis present Compartment soft  LABS  Basename 08/14/11 0350 08/13/11 0403  HGB 10.8* 12.0*  HCT 32.4* 36.2*  WBC 5.2 5.6  PLT 203 212     Basename 08/13/11 0403  NA 135  K 4.4  BUN 17  CREATININE 0.67  GLUCOSE 116*     Assessment/Plan: 3 Days Post-Op Procedure(s) (LRB): ARTHROPLASTY BIPOLAR HIP (Left)   Up with therapy Discharge to Rehab Follow up in 2 weeks at [facility]  Follow-up Information    Follow up with OLIN,Aaronjames Kelsay D in 2 weeks.   Contact information:   [facility] [address] [place] [address] [phone]          Anastasio Auerbach. Valynn Schamberger   PAC  08/15/2011, 8:08 AM

## 2011-08-15 NOTE — Discharge Summary (Signed)
Physician Discharge Summary  Patient ID: Andrew Sullivan MRN: [id number] DOB/AGE: 07/27/1970 41 y.o.  Admit date: 08/11/2011 Discharge date: 08/15/2011  Procedures:  Procedure(s) (LRB): ARTHROPLASTY BIPOLAR HIP (Left)  Attending Physician:  Dr. Durene Romans   Admission Diagnoses:   Left hip fracture    Discharge Diagnoses:  S/P left hip hemi arthroplasty  . Hip fracture, left   . Abscess of axilla, left   . Irritant contact dermatitis   . History of recurrent UTIs   . Leg pain, bilateral   . hemiparesis   . HEMATURIA   . VERTEBRAL FRACTURE, CERVICAL SPINE     HPI: 41 year old male who was ambulating with use of crutches last Wednesday when he lost his footing and fell. No LOC, dizziness or chest pain at time of fall. States he fell onto a medicine bottle that was in his pocket. Patient with history of fall downstairs at age 9 that left him with some paraparesis of both legs. Patient denies any bowel or bladder incontience. He has been in bed since the fall trying to let things heal. Was brought to the ER where he was diagnosised with left hip fracture. Orthopaedics was consulted. Risks, benefits and expectations were discussed with the patient. Patient understand the risks, benefits and expectations and wishes to proceed with surgery.   PCP: MATTHEWS,CODY, DO, DO   Discharged Condition: good  Hospital Course:  Patient underwent the above stated procedure on 08/12/2011. Patient tolerated the procedure well and brought to the recovery room in good condition and subsequently to the floor.  POD #1 BP: 120/69 ; Pulse: 63 ; Temp: 98.7 F (37.1 C) ; Resp: 16  Pt's foley was removed, as well as the hemovac drain removed. IV was changed to a saline lock. Patient reports pain as mild, pain well controlled. No events throughout the night. Incision: dressing C/D/I, no cellulitis present and compartment soft   LABS  Basename  08/13/11 0403   HGB  12.0  HCT  36.2    POD #2  BP:  115/64 ; Pulse: 85 ; Temp: 102.3 F (39.1 C) ; Resp: 20  Patient reports pain as mild, pain well controlled. States that even the decreased activity since fracturing the hip has effected his muscle tone. No events throughout the night. Incision: dressing C/D/I, no cellulitis present and compartment soft   LABS  Basename  08/14/11 0350   HGB  10.8  HCT  32.4   POD #3  BP: 121/69 ; Pulse: 68 ; Temp: 99.7 F (37.6 C) ; Resp: 16  Patient reports pain as mild, pain well controlled with medication. No events throughout the night. Ready to be discharged to rehab. Incision: dressing C/D/I, no cellulitis present and compartment soft   LABS   No new labs  Discharge Exam: General appearance: alert, cooperative and no distress Extremities: Homans sign is negative, no sign of DVT, no edema, redness or tenderness in the calves or thighs and no ulcers, gangrene or trophic changes  Disposition: Rehab with follow up in 2 weeks  Follow-up Information    Follow up with OLIN,Martinique Pizzimenti D in 2 weeks.   Contact information:   [facility] [address] [place] [address] [phone]          Discharge Orders    Future Appointments: Provider: Department: Dept Phone: Center:   08/27/2011 2:15 PM Everrett Coombe, DO [facility] Res[facility] [phone] [facility]     Future Orders Please Complete By Expires  Diet - low sodium heart healthy      Call MD / Call 911      Comments:   If you experience chest pain or shortness of breath, CALL 911 and be transported to the hospital emergency room.  If you develope a fever above 101 F, pus (white drainage) or increased drainage or redness at the wound, or calf pain, call your surgeon's office.   Discharge instructions      Comments:   Maintain surgical dressing for 8 days, then replace with gauze and tape. Keep the area dry and clean until follow up. Follow up in 2 weeks at [facility]. Call with any questions or  concerns.     Constipation Prevention      Comments:   Drink plenty of fluids.  Prune juice may be helpful.  You may use a stool softener, such as Colace (over the counter) 100 mg twice a day.  Use MiraLax (over the counter) for constipation as needed.   Increase activity slowly as tolerated      Driving restrictions      Comments:   No driving for 4 weeks   Change dressing      Comments:   Maintain surgical dressing for 8 days, then replace with 4x4 guaze and tape. Keep the area dry and clean.   TED hose      Comments:   Use stockings (TED hose) for 2 weeks on both leg(s).  You may remove them at night for sleeping.      Current Discharge Medication List    START taking these medications   Details  aspirin EC 325 MG tablet Take 1 tablet (325 mg total) by mouth 2 (two) times daily. X 4 weeks Qty: 60 tablet, Refills: 0   Comments: Start after finishing the Lovenox    docusate sodium 100 MG CAPS Take 100 mg by mouth 2 (two) times daily.    enoxaparin (LOVENOX) 40 MG/0.4ML injection Inject 0.4 mLs (40 mg total) into the skin daily. Qty: 12 Syringe, Refills: 0    HYDROcodone-acetaminophen (NORCO) 5-325 MG per tablet Take 1-2 tablets by mouth every 4 (four) hours as needed for pain. Qty: 120 tablet, Refills: 0    polyethylene glycol (MIRALAX / GLYCOLAX) packet Take 17 g by mouth 2 (two) times daily. Qty: 14 each      CONTINUE these medications which have NOT CHANGED   Details  baclofen (LIORESAL) 10 MG tablet Take 1 tablet (10 mg total) by mouth 3 (three) times daily. Qty: 90 tablet, Refills: 6      STOP taking these medications     meloxicam (MOBIC) 15 MG tablet Comments:  Reason for Stopping:            Signed: Anastasio Auerbach. Reisa Coppola   PAC  08/15/2011, 8:25 AM

## 2011-08-16 DIAGNOSIS — S72009A Fracture of unspecified part of neck of unspecified femur, initial encounter for closed fracture: Secondary | ICD-10-CM

## 2011-08-16 DIAGNOSIS — G8250 Quadriplegia, unspecified: Secondary | ICD-10-CM

## 2011-08-16 DIAGNOSIS — R259 Unspecified abnormal involuntary movements: Secondary | ICD-10-CM

## 2011-08-16 DIAGNOSIS — Z5189 Encounter for other specified aftercare: Secondary | ICD-10-CM

## 2011-08-16 DIAGNOSIS — W010XXA Fall on same level from slipping, tripping and stumbling without subsequent striking against object, initial encounter: Secondary | ICD-10-CM

## 2011-08-16 DIAGNOSIS — D62 Acute posthemorrhagic anemia: Secondary | ICD-10-CM

## 2011-08-16 NOTE — Progress Notes (Signed)
Physical Therapy Session Note  Patient Details  Name: Andrew Sullivan MRN: [id number] Date of Birth: 01/24/1971  Today's Date: 08/16/2011 Time: 4034-7425 Time Calculation (min): 45 min  Short Term Goals: Week 1:  PT Short Term Goal 1 (Week 1): Pt will perform bed <> chair transfers with supervision PT Short Term Goal 2 (Week 1): Pt will perform static standing with max A for functional task PT Short Term Goal 3 (Week 1): Pt will gait 10' in controlled environment with max A  Therapy Documentation Precautions:  Precautions Precautions: Posterior Hip;Back Restrictions Weight Bearing Restrictions: Yes LLE Weight Bearing: Weight bearing as tolerated  Therapeutic Exercise: (15') B LE stretching and AROM in sitting. Therapeutic Activity: (30') Standing frame x 25' with posture/trunk control  See FIM for current functional status  Therapy/Group: Individual Therapy  [name] J 08/16/2011, 1:34 PM

## 2011-08-16 NOTE — Progress Notes (Signed)
Patient requests colace 100 mg BID as he had been taking previously.  Patient does not want to try the Senokot-S, which is ordered PRN.  Dr. Helaine Chess notified; new order received.  Will continue to monitor.

## 2011-08-16 NOTE — Progress Notes (Signed)
Physical Therapy Session Note  Patient Details  Name: Andrew Sullivan MRN: [id number] Date of Birth: 01/13/1971  Today's Date: 08/16/2011 Time: 1100-1200 Time Calculation (min): 60 min  Short Term Goals: Week 1:  PT Short Term Goal 1 (Week 1): Pt will perform bed <> chair transfers with supervision PT Short Term Goal 2 (Week 1): Pt will perform static standing with max A for functional task PT Short Term Goal 3 (Week 1): Pt will gait 10' in controlled environment with max A  Therapy Documentation Precautions:  Precautions Precautions: Posterior Hip;Back Restrictions Weight Bearing Restrictions: Yes LLE Weight Bearing: Weight bearing as tolerated Pain: Pain Assessment Pain Assessment: No/denies pain  Therapeutic Exercise: (30') B LE stretching and AROM in sitting.  Standing frame x 20' with weight shifting, trunk control for posture and balance. (one seated rest) Therapeutic Activity: (15') Transfers from sit<->stand with Max-A with Handheld assist (3x) Gait Training: (15') while in standing frame, periodic hip hiking with associated weight shifting to bring alternating feet off floor. No true foot clearance but rather pre-gait.  See FIM for current functional status  Therapy/Group: Individual Therapy  Luismiguel Lamere J 08/16/2011, 11:18 AM

## 2011-08-16 NOTE — Progress Notes (Signed)
Physical Therapy Note  Patient Details  Name: Andrew Sullivan MRN: [id number] Date of Birth: November 24, 1970 Today's Date: 08/16/2011  Time:  1300-1330  (30 min) Pain:  3/10 left hip Individual therapy  Performed wc mobility, tub transfer.  Transfer to tub put too much flexion on Left hip going into tub.  Pt slid body so less flexion on hip when coming out.  Pt plans to stick to sponge baths after he gets home until his hip heals.      Humberto Seals 08/16/2011, 1:26 PM

## 2011-08-16 NOTE — Progress Notes (Signed)
Patient ID: CLEBERT Sullivan, male   DOB: [birth year], 41 y.o.   MRN: [id number] Chief Complaint   Patient presents with   .  Hip Pain     Left With inability to walk    No active new complaints today  HPI: Andrew Sullivan is a 41 y.o. male with history of cervical spine injury with myelopathy and paraparesis, who tripped and fell four days PTA 06/03 with acute onset of hip pain and inability to walk.. X rays done left impacted mid-cervical femur fracture, minimally comminuted. Patient was evaluated by Dr. Charlann Boxer and underwent left hip hemi on 06/04. Post op WBAT and on lovenox for DVT prophylaxis. Post op has had worsening of spasticity and problems with constipation. Evaluated by therapy team ane we felt that he would benefit from CIR program.  Review of Systems  HENT: Negative for hearing loss.  Eyes: Negative for blurred vision and double vision.  Respiratory: Negative for cough and shortness of breath.  Cardiovascular: Negative for chest pain and palpitations.  Gastrointestinal: Negative for diarrhea and constipation.  Genitourinary: Negative for dysuria, urgency and frequency.  Musculoskeletal: Positive for myalgias and joint pain.  Neurological: Negative for headaches.  Psychiatric/Behavioral: The patient has insomnia.  Past Medical History   Diagnosis  Date   .  Cervical spine fracture      C4-C6 with fusion   .  Paraparesis of both lower limbs    .  History of recurrent UTIs    .  Erectile dysfunction     Past Surgical History   Procedure  Date   .  Cervical fusion    .  Hip arthroplasty  08/12/2011     Procedure: ARTHROPLASTY BIPOLAR HIP; Surgeon: Shelda Pal, MD; Location: [facility]; Service: Orthopedics; Laterality: Left; Hemi-arthroplasty   Family history: negative for CAD, DM, cancer,CVA, or bleeding problems.  Social History: Lives with mother and step father. Independent with crutches for mobility. He reports that he has quit smoking. He has never used smokeless tobacco.  He reports that he does not drink alcohol or use illicit drugs.  Allergies   Allergen  Reactions   .  Penicillins      REACTION: itching and rash    Medications Prior to Admission   Medication  Sig  Dispense  Refill   .  baclofen (LIORESAL) 10 MG tablet  Take 1 tablet (10 mg total) by mouth 3 (three) times daily.  90 tablet  6   .  DISCONTD: meloxicam (MOBIC) 15 MG tablet  Take 15 mg by mouth daily.     Home:  Home Living  Lives With: Family  Available Help at Discharge: Family  Type of Home: House  Home Access: Level entry  Home Layout: One level  Bathroom Shower/Tub: Medical sales representative: Standard  Bathroom Accessibility: Yes  How Accessible: Accessible via walker;Accessible via wheelchair  Functional History:  Prior Function  Able to Take Stairs?: No  Driving: Yes  Vocation: On disability  Functional Status:  Mobility:  Bed Mobility  Bed Mobility: Supine to Sit  Supine to Sit: 4: Min assist;3: Mod assist  Supine to Sit: Patient Percentage: 80%  Transfers  Transfers: Sit to Stand;Stand to Sit  Sit to Stand: 1: +2 Total assist  Sit to Stand: Patient Percentage: 60%  Stand to Sit: 1: +2 Total assist  Stand to Sit: Patient Percentage: 60%  Stand Pivot Transfers: 1: +2 Total assist  Stand Pivot Transfers: Patient Percentage: 40%  Lateral/Scoot  Transfers: 1: +2 Total assist  Lateral Transfers: Patient Percentage: 80%  ADL:  ADL  Eating/Feeding: Simulated;Independent  Where Assessed - Eating/Feeding: Edge of bed  Grooming: Simulated;Set up  Where Assessed - Grooming: Unsupported sitting  Upper Body Bathing: Simulated;Set up  Where Assessed - Upper Body Bathing: Unsupported sitting  Lower Body Bathing: Simulated;+2 Total assistance (pt 70% with AE)  Where Assessed - Lower Body Bathing: Supported sit to stand  Upper Body Dressing: Simulated;Minimal assistance (iv)  Where Assessed - Upper Body Dressing: Unsupported sitting  Lower Body Dressing: Simulated;+2  Total assistance (with AE)  Where Assessed - Lower Body Dressing: Sopported sit to stand  Equipment Used: Reacher;Sock aid  Transfers/Ambulation Related to ADLs: worked on sit to stand only: cotx with PT. 3rd person used to block knees  ADL Comments: used sock aid at EOB on RLE: leg came up with pulliing. Will need to sit greater than 90 degrees for safety. Pt unable to lift RLE at this time for ADLs.  Cognition:  Cognition  Arousal/Alertness: Awake/alert  Orientation Level: Oriented X4  Cognition  Overall Cognitive Status: Appears within functional limits for tasks assessed/performed  Arousal/Alertness: Awake/alert  Orientation Level: Appears intact for tasks assessed  Behavior During Session: [facility] for tasks performed  Blood pressure 121/69, pulse 68, temperature 99.7 F (37.6 C), temperature source Oral, resp. rate 16, height 6\' 3"  (1.905 m), weight 78.4 kg (172 lb 13.5 oz), SpO2 97.00%.  Physical Exam  Nursing note and vitals reviewed.  Constitutional: He is oriented to person, place, and time. He appears well-developed and well-nourished.  HENT:  Head: Normocephalic and atraumatic.  Eyes: Pupils are equal, round, and reactive to light. eomi.  Neck: Normal range of motion.  Cardiovascular: Normal rate and regular rhythm. No murmurs, rubs, or gallops  Pulmonary/Chest: Effort normal and breath sounds normal. No wheezes rales or rhonchi  Abdominal: Soft. Bowel sounds are normal.  Musculoskeletal: He exhibits no edema and no tenderness.  Neurological: He is alert and oriented to person, place, and time.normal insight, awareness, memory. He displays abnormal reflex. He exhibits abnormal muscle tone. With increased tone noted throughout both lower extremiteis (grossly 2-3). DTR's are 3+ in all 4. Strength is grossly 2/5 in the lower extremities. HI are 3/5. The remainder or his UE's are 4/5. He denies sensory loss.  Skin: Skin is warm and dry. Wound is intact  Results for orders placed  during the hospital encounter of 08/11/11 (from the past 48 hour(s))   CBC Status: Abnormal    Collection Time    08/14/11 3:50 AM   Component  Value  Range  Comment    WBC  5.2  4.0 - 10.5 (K/uL)     RBC  3.59 (*)  4.22 - 5.81 (MIL/uL)     Hemoglobin  10.8 (*)  13.0 - 17.0 (g/dL)     HCT  10.9 (*)  32.3 - 52.0 (%)     MCV  90.3  78.0 - 100.0 (fL)     MCH  30.1  26.0 - 34.0 (pg)     MCHC  33.3  30.0 - 36.0 (g/dL)     RDW  55.7  32.2 - 15.5 (%)     Platelets  203  150 - 400 (K/uL)    No results found.  Post Admission Physician Evaluation:  1. Functional deficits secondary to left femoral neck fx s/p left hip hemi. 2. Patient is admitted to receive collaborative, interdisciplinary care between the physiatrist, rehab nursing staff, and  therapy team. 3. Patient's level of medical complexity and substantial therapy needs in context of that medical necessity cannot be provided at a lesser intensity of care such as a SNF. 4. Patient has experienced substantial functional loss from his/her baseline which was documented above under the "Functional History" and "Functional Status" headings. Judging by the patient's diagnosis, physical exam, and functional history, the patient has potential for functional progress which will result in measurable gains while on inpatient rehab. These gains will be of substantial and practical use upon discharge in facilitating mobility and self-care at the household level. 5. Physiatrist will provide 24 hour management of medical needs as well as oversight of the therapy plan/treatment and provide guidance as appropriate regarding the interaction of the two. 6. 24 hour rehab nursing will assist with bladder management, bowel management, safety, skin/wound care, disease management, medication administration, pain management and patient education and help integrate therapy concepts, techniques,education, etc. 7. PT will assess and treat for: fxnl mobility, safety, adaptive  equipment, pain. Goals are: mod I. 8. OT will assess and treat for: UES, fxnl mobility, ADL's, safety, pain, AET. Goals are: mod I to supervision. 9. SLP will assess and treat for n/a. 10. Case Management and Social Worker will assess and treat for psychological issues and discharge planning. 11. Team conference will be held weekly to assess progress toward goals and to determine barriers to discharge. 12. Patient will receive at least 3 hours of therapy per day at least 5 days per week. 13. ELOS: 2 + weeks Prognosis: excellent This patient will need education regarding safety, appropriate equipment, technique, etc. He has fallen multiple times at home. He has done remarkably well, all considering, so he may be resistant to suggestion in these areas.  Medical Problem List and Plan:  1. DVT Prophylaxis/Anticoagulation: Pharmaceutical: Lovenox  2. Pain Management: reasonable.  3. Mood: motivated to get better.  4. FUO: Likely due to atelectasis. Encourage IS q 1 hour. Will check UA/UC as he has history of recurrent UTIs.  5. ABLA: add iron supplement.  6. Spasticity: will increase baclofen to help with exacerbation of symptoms.  7. Contipation: resolved. Will schedule senna on prn basis

## 2011-08-16 NOTE — Evaluation (Signed)
Occupational Therapy Assessment and Plan  Patient Details  Name: Andrew Sullivan MRN: [id number] Date of Birth: 18-Jul-1970  OT Diagnosis: abnormal posture Rehab Potential:   ELOS:  2-3 weeks  Today's Date: 08/16/2011 Time:  1st session:  0900-1000   (60 min)  Time Calculation (min): 60 min  Problem List:  Patient Active Problem List  Diagnoses  . hemiparesis  . HEMATURIA  . VERTEBRAL FRACTURE, CERVICAL SPINE  . Leg pain, bilateral  . History of recurrent UTIs  . Irritant contact dermatitis  . Abscess of axilla, left  . Hip fracture, left    Past Medical History:  Past Medical History  Diagnosis Date  . Cervical spine fracture     C4-C6 with fusion  . Paraparesis of both lower limbs   . History of recurrent UTIs   . Erectile dysfunction    Past Surgical History:  Past Surgical History  Procedure Date  . Cervical fusion   . Hip arthroplasty 08/12/2011    Procedure: ARTHROPLASTY BIPOLAR HIP;  Surgeon: Shelda Pal, MD;  Location: [facility];  Service: Orthopedics;  Laterality: Left;  Hemi-arthroplasty    Assessment & Plan Clinical Impression: Andrew Sullivan is a 41 y.o. male with history of cervical spine injury with myelopathy and paraparesis, who tripped and fell four days PTA 06/03 with acute onset of hip pain and inability to walk.. X rays done left impacted mid-cervical femur fracture, minimally comminuted. Patient was evaluated by Dr. Charlann Boxer and underwent left hip hemi on 06/04. Post op WBAT and on lovenox for DVT prophylaxis. Post op has had worsening of spasticity and problems with constipation. Evaluated by therapy team ane we felt that he would benefit from CIR program.  Patient transferred to CIR on 08/15/2011 .    Patient currently requires min with basic self-care skills secondary to abnormal tone.  Prior to hospitalization, patient could complete BADL and IADL with no assist.  Patient will benefit from skilled intervention to increase independence with basic  self-care skills prior to discharge home with care partner.  Anticipate patient will require intermittent supervision and follow up home health.     OT Evaluation Precautions/Restrictions      Pain:  1/10   Home Living/Prior Functioning Home Living Lives With: Family Available Help at Discharge: Family Type of Home: House Home Access: Level entry Home Layout: One level Bathroom Shower/Tub: Engineer, manufacturing systems: Standard Bathroom Accessibility: Yes How Accessible: Accessible via walker;Accessible via wheelchair Home Adaptive Equipment: Crutches IADL History Homemaking Responsibilities:  (does laundry) Current License: Yes Mode of Transportation: Car Leisure and Hobbies: movies (watches movies) Prior Function Level of Independence: Requires assistive device for independence;Independent with basic ADLs Able to Take Stairs?: Yes Driving: Yes  Extremity/Trunk Assessment:  RUE and LUE are wfl for AROM and 5/5 shoulder, triceps, and biceps.  Both Hands are 4/5 with decreased manipulation      See FIM for current functional status Refer to Care Plan for Long Term Goals  Recommendations for other services: None  Discharge Criteria: Patient will be discharged from OT if patient refuses treatment 3 consecutive times without medical reason, if treatment goals not met, if there is a change in medical status, if patient makes no progress towards goals or if patient is discharged from hospital.  The above assessment, treatment plan, treatment alternatives and goals were discussed and mutually agreed upon: by patient  Humberto Seals 08/16/2011, 4:31 PM

## 2011-08-17 NOTE — Progress Notes (Signed)
Occupational Therapy Session Note  Patient Details  Name: Andrew Sullivan MRN: [id number] Date of Birth: [birth year]  Today's Date: 08/17/2011 Time: 7829-5621 Time Calculation (min): 55 min  Short Term Goals: Week 1:  OT Short Term Goal 1 (Week 1): pt. will transfer to toilet with supervision OT Short Term Goal 2 (Week 1): Pt. will transfer to tub bench with supervision OT Short Term Goal 3 (Week 1): Pt. will use AE with set up  Skilled Therapeutic Interventions/Progress Updates:    Engaged in bathing and dressing activity at shower level.  Pt transferred to shower seat and back with minimal assist performing a lateral scoot transfer.  He did lateral leans to wash periarea.  Dressed with lateral leans and wc push up to pull up pants.  Pt. Can continue to work on pelvic mobility and sitting with dynamic balance activities.    Therapy Documentation Precautions:  Precautions Precautions: Posterior Hip;Back Restrictions Weight Bearing Restrictions: Yes LLE Weight Bearing: Weight bearing as tolerated    Pain: none    See FIM for current functional status  Therapy/Group: Individual Therapy  Humberto Seals 08/17/2011, 4:19 PM

## 2011-08-17 NOTE — Progress Notes (Signed)
Chief Complaint   Patient presents with   .  Hip Pain     Left With inability to walk    No new complaints today  HPI: Andrew Sullivan is a 41 y.o. male with history of cervical spine injury with myelopathy and paraparesis, who tripped and fell four days PTA 06/03 with acute onset of hip pain and inability to walk.. X rays done left impacted mid-cervical femur fracture, minimally comminuted. Patient was evaluated by Dr. Charlann Boxer and underwent left hip hemi on 06/04. Post op WBAT and on lovenox for DVT prophylaxis. Post op has had worsening of spasticity and problems with constipation. Evaluated by therapy team ane we felt that he would benefit from CIR program.  Review of Systems  HENT: Negative for hearing loss.  Eyes: Negative for blurred vision and double vision.  Respiratory: Negative for cough and shortness of breath.  Cardiovascular: Negative for chest pain and palpitations.  Gastrointestinal: Negative for diarrhea and constipation.  Genitourinary: Negative for dysuria, urgency and frequency.  Musculoskeletal: Positive for myalgias and joint pain.  Neurological: Negative for headaches.  Psychiatric/Behavioral: The patient has insomnia.  Past Medical History   Diagnosis  Date   .  Cervical spine fracture      C4-C6 with fusion   .  Paraparesis of both lower limbs    .  History of recurrent UTIs    .  Erectile dysfunction     Past Surgical History   Procedure  Date   .  Cervical fusion    .  Hip arthroplasty  08/12/2011     Procedure: ARTHROPLASTY BIPOLAR HIP; Surgeon: Shelda Pal, MD; Location: [facility]; Service: Orthopedics; Laterality: Left; Hemi-arthroplasty   Family history: negative for CAD, DM, cancer,CVA, or bleeding problems.  Social History: Lives with mother and step father. Independent with crutches for mobility. He reports that he has quit smoking. He has never used smokeless tobacco. He reports that he does not drink alcohol or use illicit drugs.  Allergies     Allergen  Reactions   .  Penicillins      REACTION: itching and rash    Medications Prior to Admission   Medication  Sig  Dispense  Refill   .  baclofen (LIORESAL) 10 MG tablet  Take 1 tablet (10 mg total) by mouth 3 (three) times daily.  90 tablet  6   .  DISCONTD: meloxicam (MOBIC) 15 MG tablet  Take 15 mg by mouth daily.     Home:  Home Living  Lives With: Family  Available Help at Discharge: Family  Type of Home: House  Home Access: Level entry  Home Layout: One level  Bathroom Shower/Tub: Medical sales representative: Standard  Bathroom Accessibility: Yes  How Accessible: Accessible via walker;Accessible via wheelchair  Functional History:  Prior Function  Able to Take Stairs?: No  Driving: Yes  Vocation: On disability  Functional Status:  Mobility:  Bed Mobility  Bed Mobility: Supine to Sit  Supine to Sit: 4: Min assist;3: Mod assist  Supine to Sit: Patient Percentage: 80%  Transfers  Transfers: Sit to Stand;Stand to Sit  Sit to Stand: 1: +2 Total assist  Sit to Stand: Patient Percentage: 60%  Stand to Sit: 1: +2 Total assist  Stand to Sit: Patient Percentage: 60%  Stand Pivot Transfers: 1: +2 Total assist  Stand Pivot Transfers: Patient Percentage: 40%  Lateral/Scoot Transfers: 1: +2 Total assist  Lateral Transfers: Patient Percentage: 80%  ADL:  ADL  Eating/Feeding: Simulated;Independent  Where Assessed - Eating/Feeding: Edge of bed  Grooming: Simulated;Set up  Where Assessed - Grooming: Unsupported sitting  Upper Body Bathing: Simulated;Set up  Where Assessed - Upper Body Bathing: Unsupported sitting  Lower Body Bathing: Simulated;+2 Total assistance (pt 70% with AE)  Where Assessed - Lower Body Bathing: Supported sit to stand  Upper Body Dressing: Simulated;Minimal assistance (iv)  Where Assessed - Upper Body Dressing: Unsupported sitting  Lower Body Dressing: Simulated;+2 Total assistance (with AE)  Where Assessed - Lower Body Dressing: Sopported  sit to stand  Equipment Used: Reacher;Sock aid  Transfers/Ambulation Related to ADLs: worked on sit to stand only: cotx with PT. 3rd person used to block knees  ADL Comments: used sock aid at EOB on RLE: leg came up with pulliing. Will need to sit greater than 90 degrees for safety. Pt unable to lift RLE at this time for ADLs.  Cognition:  Cognition  Arousal/Alertness: Awake/alert  Orientation Level: Oriented X4  Cognition  Overall Cognitive Status: Appears within functional limits for tasks assessed/performed  Arousal/Alertness: Awake/alert  Orientation Level: Appears intact for tasks assessed  Behavior During Session: [facility] for tasks performed  Blood pressure 121/69, pulse 68, temperature 99.7 F (37.6 C), temperature source Oral, resp. rate 16, height 6\' 3"  (1.905 m), weight 78.4 kg (172 lb 13.5 oz), SpO2 97.00%.  Physical Exam  Nursing note and vitals reviewed.  Constitutional: He is oriented to person, place, and time. He appears well-developed and well-nourished.  HENT:  Head: Normocephalic and atraumatic.  Eyes: Pupils are equal, round, and reactive to light. eomi.  Neck: Normal range of motion.  Cardiovascular: Normal rate and regular rhythm. No murmurs, rubs, or gallops  Pulmonary/Chest: Effort normal and breath sounds normal. No wheezes rales or rhonchi  Abdominal: Soft. Bowel sounds are normal.  Musculoskeletal: He exhibits no edema and no tenderness.  Neurological: He is alert and oriented to person, place, and time.normal insight, awareness, memory. He displays abnormal reflex. He exhibits abnormal muscle tone. With increased tone noted throughout both lower extremiteis (grossly 2-3). DTR's are 3+ in all 4. Strength is grossly 2/5 in the lower extremities. HI are 3/5. The remainder or his UE's are 4/5. He denies sensory loss.  Skin: Skin is warm and dry. Wound is intact  Results for orders placed during the hospital encounter of 08/11/11 (from the past 48 hour(s))   CBC  Status: Abnormal    Collection Time    08/14/11 3:50 AM   Component  Value  Range  Comment    WBC  5.2  4.0 - 10.5 (K/uL)     RBC  3.59 (*)  4.22 - 5.81 (MIL/uL)     Hemoglobin  10.8 (*)  13.0 - 17.0 (g/dL)     HCT  09.8 (*)  11.9 - 52.0 (%)     MCV  90.3  78.0 - 100.0 (fL)     MCH  30.1  26.0 - 34.0 (pg)     MCHC  33.3  30.0 - 36.0 (g/dL)     RDW  14.7  82.9 - 15.5 (%)     Platelets  203  150 - 400 (K/uL)    No results found.  Post Admission Physician Evaluation:  1. Functional deficits secondary to left femoral neck fx s/p left hip hemi. 2. Patient is admitted to receive collaborative, interdisciplinary care between the physiatrist, rehab nursing staff, and therapy team. 3. Patient's level of medical complexity and substantial therapy needs in context of that  medical necessity cannot be provided at a lesser intensity of care such as a SNF. 4. Patient has experienced substantial functional loss from his/her baseline which was documented above under the "Functional History" and "Functional Status" headings. Judging by the patient's diagnosis, physical exam, and functional history, the patient has potential for functional progress which will result in measurable gains while on inpatient rehab. These gains will be of substantial and practical use upon discharge in facilitating mobility and self-care at the household level. 5. Physiatrist will provide 24 hour management of medical needs as well as oversight of the therapy plan/treatment and provide guidance as appropriate regarding the interaction of the two. 6. 24 hour rehab nursing will assist with bladder management, bowel management, safety, skin/wound care, disease management, medication administration, pain management and patient education and help integrate therapy concepts, techniques,education, etc. 7. PT will assess and treat for: fxnl mobility, safety, adaptive equipment, pain. Goals are: mod I. 8. OT will assess and treat for: UES, fxnl  mobility, ADL's, safety, pain, AET. Goals are: mod I to supervision. 9. SLP will assess and treat for n/a. 10. Case Management and Social Worker will assess and treat for psychological issues and discharge planning. 11. Team conference will be held weekly to assess progress toward goals and to determine barriers to discharge. 12. Patient will receive at least 3 hours of therapy per day at least 5 days per week. 13. ELOS: 2 + weeks Prognosis: excellent This patient will need education regarding safety, appropriate equipment, technique, etc. He has fallen multiple times at home. He has done remarkably well, all considering, so he may be resistant to suggestion in these areas.  Medical Problem List and Plan:  1. DVT Prophylaxis/Anticoagulation: Pharmaceutical: Lovenox  2. Pain Management: reasonable.  3. Mood: motivated to get better.  4. FUO: Likely due to atelectasis. Encourage IS q 1 hour. Will check UA/UC as he has history of recurrent UTIs.  5. ABLA: add iron supplement.  6. Spasticity: will increase baclofen to help with exacerbation of symptoms.  7. Contipation: resolved. Will schedule senna on prn basis

## 2011-08-18 DIAGNOSIS — W010XXA Fall on same level from slipping, tripping and stumbling without subsequent striking against object, initial encounter: Secondary | ICD-10-CM

## 2011-08-18 DIAGNOSIS — S72009A Fracture of unspecified part of neck of unspecified femur, initial encounter for closed fracture: Secondary | ICD-10-CM

## 2011-08-18 DIAGNOSIS — Z5189 Encounter for other specified aftercare: Secondary | ICD-10-CM

## 2011-08-18 LAB — COMPREHENSIVE METABOLIC PANEL
AST: 28 U/L (ref 0–37)
Albumin: 2.7 g/dL — ABNORMAL LOW (ref 3.5–5.2)
Calcium: 8.6 mg/dL (ref 8.4–10.5)
Chloride: 105 mEq/L (ref 96–112)
Creatinine, Ser: 0.75 mg/dL (ref 0.50–1.35)
Total Protein: 6.2 g/dL (ref 6.0–8.3)

## 2011-08-18 LAB — URINE CULTURE

## 2011-08-18 LAB — CBC
MCV: 89.2 fL (ref 78.0–100.0)
Platelets: 306 10*3/uL (ref 150–400)
RDW: 11.8 % (ref 11.5–15.5)
WBC: 4.4 10*3/uL (ref 4.0–10.5)

## 2011-08-18 LAB — DIFFERENTIAL
Lymphocytes Relative: 27 % (ref 12–46)
Lymphs Abs: 1.2 10*3/uL (ref 0.7–4.0)
Monocytes Absolute: 0.4 10*3/uL (ref 0.1–1.0)
Monocytes Relative: 8 % (ref 3–12)
Neutro Abs: 2.6 10*3/uL (ref 1.7–7.7)

## 2011-08-18 MED ORDER — CIPROFLOXACIN HCL 250 MG PO TABS
250.0000 mg | ORAL_TABLET | Freq: Two times a day (BID) | ORAL | Status: DC
  Filled 2011-08-18 (×3): qty 1

## 2011-08-18 MED ORDER — SULFAMETHOXAZOLE-TRIMETHOPRIM 400-80 MG PO TABS
1.0000 | ORAL_TABLET | Freq: Two times a day (BID) | ORAL | Status: DC
Start: 2011-08-18 — End: 2011-08-18

## 2011-08-18 MED ORDER — SULFAMETHOXAZOLE-TMP DS 800-160 MG PO TABS
1.0000 | ORAL_TABLET | Freq: Two times a day (BID) | ORAL | Status: DC
  Administered 2011-08-18 – 2011-08-25 (×14): 1 via ORAL
  Filled 2011-08-18 (×16): qty 1

## 2011-08-18 NOTE — Progress Notes (Signed)
Physical Therapy Note  Patient Details  Name: Andrew Sullivan MRN: [id number] Date of Birth: [birth year] Today's Date: 08/18/2011  Time: 1430-1515 45 minutes  No c/o pain.  Pt performed all w/c mobility on unit with mod I, assist for w/c parts management.  Transfers with lateral scoot with supervision/set up assitance.  Sit to stands from elevated surface with max A.  Pt requires manual facilitation for trunk control with transitioning hands from mat to RW.  Standing tolerance 3 x 4 minutes with wt shifts.  Mod to max A for trunk control in standing.  Pt continues to put most wt through UEs, requires tactile cues to increase LE wt bearing.  Wt shifts to promote LE wt bearing.  Pt able to increase wt bearing on R LE and lift L LE slightly, unable to bear wt on L LE to lift R LE.  Kinetron to promote L LE hamstring and glute activation.  Began with AAROM but with tactile cues pt able to elicit contraction and perform kinetron for 4 x 1 minute with AROM.  Individual therapy   Annikah Lovins 08/18/2011, 3:26 PM

## 2011-08-18 NOTE — Plan of Care (Signed)
Problem: RH BOWEL ELIMINATION Goal: RH STG MANAGE BOWEL WITH ASSISTANCE STG Manage Bowel with Modified Independence.  Outcome: Progressing No report of incontinent

## 2011-08-18 NOTE — Care Management (Signed)
[facility] Individual Statement of Services  Patient Name:  Andrew Sullivan  Date:  08/18/2011  Welcome to the [facility].  Our goal is to provide you with an individualized program based on your diagnosis and situation, designed to meet your specific needs.  With this comprehensive rehabilitation program, you will be expected to participate in at least 3 hours of rehabilitation therapies Monday-Friday, with modified therapy programming on the weekends.  Your rehabilitation program will include the following services:  Physical Therapy (PT), Occupational Therapy (OT), 24 hour per day rehabilitation nursing, Therapeutic Recreaction (TR), Case Management (RN and Child psychotherapist), Rehabilitation Medicine, Nutrition Services and Pharmacy Services  Weekly team conferences will be held on  Tuesday  to discuss your progress.  Your RN Case Designer, television/film set will talk with you frequently to get your input and to update you on team discussions.  Team conferences with you and your family in attendance may also be held.  Expected length of stay: 2-3 weeks                                                                                                                        Overall anticipated outcome: Supervision-Min Assist  Depending on your progress and recovery, your program may change.  Your RN Case Estate agent will coordinate services and will keep you informed of any changes.  Your RN Sports coach and SW names and contact numbers are listed  below.  The following services may also be recommended but are not provided by the [facility]:   Driving Evaluations  Home Health Rehabiltiation Services  Outpatient Rehabilitatation [facility]  Vocational Rehabilitation   Arrangements will be made to provide these services after discharge if needed.  Arrangements include referral to agencies that provide these services.  Your  insurance has been verified to be: Medicaid [employer] primary doctor is:  Dr. Everrett Coombe  Pertinent information will be shared with your doctor and your insurance company.  Case Manager: Melanee Spry, [facility] [phone]  Social Worker:  [place], [place] [phone]  Information discussed with and copy given to patient by: Brock Ra, 08/18/2011, 11:44 AM

## 2011-08-18 NOTE — Discharge Instructions (Signed)
Inpatient Rehab Discharge Instructions  Andrew Sullivan Discharge date and time:    Activities/Precautions/ Functional Status: Activity: {discharge activity:18261} Diet: {diet:18262} Wound Care: {wound care:18263} Functional status:  ___ No restrictions     ___ Walk up steps independently ___ 24/7 supervision/assistance   ___ Walk up steps with assistance ___ Intermittent supervision/assistance  ___ Bathe/dress independently ___ Walk with walker     ___ Bathe/dress with assistance ___ Walk Independently    ___ Shower independently ___ Walk with assistance    ___ Shower with assistance ___ No alcohol     ___ Return to work/school ________  Special Instructions: Maintain left total hip precautions.   My questions have been answered and I understand these instructions. I will adhere to these goals and the provided educational materials after my discharge from the hospital.  Patient/Caregiver Signature _______________________________ Date __________  Clinician Signature _______________________________________ Date __________  Please bring this form and your medication list with you to all your follow-up doctor's appointments.

## 2011-08-18 NOTE — Progress Notes (Signed)
Alert and orientated x 4. Calls appropriately for assistance. Continent of bowel and bladder. Last bowel movement 08/18/11 per noc nurse report. R leg spasmatic. Pt receiving scheduled bacolfen. L hip with drsg cdi. No c/o pain. Up in w/c between therapy session.

## 2011-08-18 NOTE — Progress Notes (Signed)
Patient information reviewed and entered into UDS-PRO system by Susen Haskew, RN, CRRN, PPS Coordinator.  Information including medical coding and functional independence measure will be reviewed and updated through discharge.    

## 2011-08-18 NOTE — Progress Notes (Signed)
Reviewed and in agreement with treatment provided.  

## 2011-08-18 NOTE — Progress Notes (Signed)
Occupational Therapy Session Notes  Patient Details  Name: Andrew Sullivan MRN: [id number] Date of Birth: 06/18/1970  Today's Date: 08/18/2011  Short Term Goals: Week 1:  OT Short Term Goal 1 (Week 1): pt. will transfer to toilet with supervision OT Short Term Goal 2 (Week 1): Pt. will transfer to tub bench with supervision OT Short Term Goal 3 (Week 1): Pt. will use AE with set up  Skilled Therapeutic Interventions/Progress Updates:   Session #1 1030-1130 - 30 Minutes Individual Therapy No complaints of pain, "It's bearable". Patient seated in w/c upon entering room. Patient requested to doff clothes in w/c prior to getting into shower. Patient maneuvered w/c in bathroom for min assist shower stall transfer onto shower seat. Patient adjusted water temperature and able to perform UB/LB bathing seated at a modified independent level. Patient transferred back to w/c with min assist and required some assistance donning bilateral leg rests back onto w/c. Patient then sat in w/c for UB/LB dressing, performing lateral leans for LB dressing, and needed assistance with donning TED hose and bilateral socks. Plan to introduced adaptive equipment to help increase independence with LB ADLs. Left patient seated in w/c beside bed with call bell & phone within reach.   Session #2 1300-1330 - 30 Minutes Individual Therapy No complaints of pain, "It's bearable, I'm all right." Treatment focus on bed mobility; w/c-> edge of bed, sit-> supine,  supine -> sit, edge of bed -> w/c. Introduced leg lifter to assist with LLE and help increase independence with bed mobility. Patient with difficult time with and without leg lifter secondary to LLE starting to internally rotating during sit -> supine mobility. Patient propelled self back to room at mod I level.   Precautions:  Precautions Precautions: Posterior Hip;Back Restrictions Weight Bearing Restrictions: Yes LLE Weight Bearing: Weight bearing as  tolerated  See FIM for current functional status  [name] 08/18/2011, 11:23 AM

## 2011-08-18 NOTE — Progress Notes (Signed)
Social Work  Social Work Assessment and Plan  Patient Details  Name: Andrew Sullivan MRN: [id number] Date of Birth: [birth year]  Today's Date: 08/18/2011  Problem List:  Patient Active Problem List  Diagnoses  . hemiparesis  . HEMATURIA  . VERTEBRAL FRACTURE, CERVICAL SPINE  . Leg pain, bilateral  . History of recurrent UTIs  . Irritant contact dermatitis  . Abscess of axilla, left  . Hip fracture, left   Past Medical History:  Past Medical History  Diagnosis Date  . Cervical spine fracture     C4-C6 with fusion  . Paraparesis of both lower limbs   . History of recurrent UTIs   . Erectile dysfunction    Past Surgical History:  Past Surgical History  Procedure Date  . Cervical fusion   . Hip arthroplasty 08/12/2011    Procedure: ARTHROPLASTY BIPOLAR HIP;  Surgeon: Shelda Pal, MD;  Location: [facility];  Service: Orthopedics;  Laterality: Left;  Hemi-arthroplasty   Social History:  reports that he has quit smoking. He has never used smokeless tobacco. He reports that he does not drink alcohol or use illicit drugs.  Family / Support Systems Marital Status: Single Other Supports: mother and her husband: Britta Mccreedy Rothert (C) [phone] Anticipated Caregiver: mother Ability/Limitations of Caregiver: both mother and her spouse are working full-time days Caregiver Availability: Evenings only Family Dynamics: pt describes parents as very supportive, however, limited in amount of support they can offer due to job hours  Social History Preferred language: English Religion: Non-Denominational Cultural Background: NA Education: HS grad Read: Yes Write: Yes Employment Status: Disabled Date Retired/Disabled/Unemployed: 77 @ age 41 Legal Hisotry/Current Legal Issues: none Guardian/Conservator: none   Abuse/Neglect Physical Abuse: Denies Verbal Abuse: Denies Sexual Abuse: Denies Exploitation of patient/patient's resources: Denies Self-Neglect: Denies  Emotional  Status Pt's affect, behavior adn adjustment status: Very pleasant gentleman here after a fall and hip fx.  Talks openly with this SW about his frustration with being back in the hospital "...almost 25 years to the day of the first time...that got me a [place] depressed at first but I've moved out of that".  Depression Screen = 0 with patient reporting he "talks himself through it". Recent Psychosocial Issues: None Pyschiatric History: None Substance Abuse History: None  Patient / Family Perceptions, Expectations & Goals Pt/Family understanding of illness & functional limitations: pt has a good, basic understanding of the injury he suffered in his fall, precautions to be followed and need for therapy Premorbid pt/family roles/activities: Pt was very independent PTA noting that he "gets out of the house everyday...living a normal life...just couldn't run, jump or play any ball..." Anticipated changes in roles/activities/participation: Pt aware he may need some increased assistance from family, yet hopeful this will be only temporary. Pt/family expectations/goals: Pt hopeful he can reach some mod i goals since his parents must work.  Community Resources [employer]: None Premorbid Home Care/DME Agencies: None Transportation available at discharge: parents evenings  Discharge Planning Living Arrangements: Parent Support Systems: Parent Type of Residence: Private residence Insurance Resources: [employer] (specify county) Architect: Field seismologist Screen Referred: No Living Expenses: Lives with family Money Management: Patient Do you have any problems obtaining your medications?: No Home Management: pt and parents providing Patient/Family Preliminary Plans: Pt plans to return home with mother and her husband Barriers to Discharge: Family Support (both mother and husband are working days) Social Work Anticipated Follow Up Needs: HH/OP Expected length of stay:  10-14 days  Clinical Impression Very pleasant  gentleman here after fall with hip fx and h/o SCI in 1988 - on disability.  Living with his mother and her husband who both work days, therefore, he will need to be mod i to return home safely.  Milania Haubner 08/18/2011, 3:44 PM

## 2011-08-18 NOTE — Plan of Care (Signed)
Problem: RH SAFETY Goal: RH STG ADHERE TO SAFETY PRECAUTIONS W/ASSISTANCE/DEVICE STG Adhere to Safety Precautions With Assistance/Device. Supervision  Outcome: Progressing No unsafe behavior reported     

## 2011-08-18 NOTE — Progress Notes (Signed)
Patient ID: Andrew Sullivan, male   DOB: [birth year], 41 y.o.   MRN: [id number] Subjective/Complaints:   Objective: Vital Signs: Blood pressure 102/57, pulse 57, temperature 98.5 F (36.9 C), temperature source Oral, resp. rate 18, weight 79.2 kg (174 lb 9.7 oz), SpO2 99.00%. No results found. Results for orders placed during the hospital encounter of 08/15/11 (from the past 72 hour(s))  URINALYSIS, ROUTINE W REFLEX MICROSCOPIC     Status: Abnormal   Collection Time   08/15/11  6:45 PM      Component Value Range Comment   Color, Urine AMBER (*) YELLOW  BIOCHEMICALS MAY BE AFFECTED BY COLOR   APPearance CLEAR  CLEAR     Specific Gravity, Urine 1.033 (*) 1.005 - 1.030     pH 6.0  5.0 - 8.0     Glucose, UA NEGATIVE  NEGATIVE (mg/dL)    Hgb urine dipstick NEGATIVE  NEGATIVE     Bilirubin Urine SMALL (*) NEGATIVE     Ketones, ur NEGATIVE  NEGATIVE (mg/dL)    Protein, ur 30 (*) NEGATIVE (mg/dL)    Urobilinogen, UA 1.0  0.0 - 1.0 (mg/dL)    Nitrite NEGATIVE  NEGATIVE     Leukocytes, UA TRACE (*) NEGATIVE    URINE CULTURE     Status: Normal (Preliminary result)   Collection Time   08/15/11  6:45 PM      Component Value Range Comment   Specimen Description URINE, CLEAN CATCH      Special Requests NONE      Culture  Setup Time [id number]      Colony Count 75,000 COLONIES/ML      Culture GRAM NEGATIVE RODS      Report Status PENDING     URINE MICROSCOPIC-ADD ON     Status: Abnormal   Collection Time   08/15/11  6:45 PM      Component Value Range Comment   Squamous Epithelial / LPF RARE  RARE     WBC, UA 3-6  <3 (WBC/hpf)    RBC / HPF 0-2  <3 (RBC/hpf)    Bacteria, UA FEW (*) RARE     Urine-Other MUCOUS PRESENT   ABUNDANT     HEENT: normal Cardio: RRR Resp: CTA B/L GI: BS positive Extremity:  No Edema Skin:   Other surgical incision L hip Neuro: Other see below Musc/Skel:  Swelling L thigh motor strength is 4/5 in the right deltoid, biceps, triceps,, grip 4 minus in the wrist  extensor on the left side he is 4+ in the deltoid, biceps, triceps, grip, wrist extensor  Lower extremity strength is 2 minus bilateral quads with support 0 at the ankle dorsiflexors and plantar flexors  Sensation is intact in the upper and lower extremity  Patient has clonus at bilateral ankles.  Hyperactive reflex in the right patellar.   Assessment/Plan: 1. Functional deficits secondary to L femoral neck fracture which require 3+ hours per day of interdisciplinary therapy in a comprehensive inpatient rehab setting. Physiatrist is providing close team supervision and 24 hour management of active medical problems listed below. Physiatrist and rehab team continue to assess barriers to discharge/monitor patient progress toward functional and medical goals. FIM: FIM - Bathing Bathing Steps Patient Completed: Chest;Right Arm;Left Arm;Abdomen;Front perineal area;Buttocks;Right upper leg;Left upper leg Bathing: 4: Min-Patient completes 8-9 45f 10 parts or 75+ percent  FIM - Upper Body Dressing/Undressing Upper body dressing/undressing steps patient completed: Thread/unthread right sleeve of pullover shirt/dresss;Thread/unthread left sleeve of pullover shirt/dress;Put head through opening of  pull over shirt/dress;Pull shirt over trunk Upper body dressing/undressing: 5: Set-up assist to: Obtain clothing/put away FIM - Lower Body Dressing/Undressing Lower body dressing/undressing steps patient completed: Thread/unthread right underwear leg;Thread/unthread left underwear leg;Thread/unthread right pants leg;Thread/unthread left pants leg Lower body dressing/undressing: 3: Mod-Patient completed 50-74% of tasks  FIM - Toileting Toileting steps completed by patient: Adjust clothing prior to toileting;Performs perineal hygiene;Adjust clothing after toileting Toileting Assistive Devices: Grab bar or rail for support Toileting: 4: Steadying assist  FIM - Diplomatic Services operational officer  Devices: Grab bars Toilet Transfers: 4-From toilet/BSC: Min A (steadying Pt. > 75%);4-To toilet/BSC: Min A (steadying Pt. > 75%)  FIM - Bed/Chair Transfer Bed/Chair Transfer Assistive Devices: Arm rests Bed/Chair Transfer: 3: Chair or W/C > Bed: Mod A (lift or lower assist);3: Bed > Chair or W/C: Mod A (lift or lower assist);3: Sit > Supine: Mod A (lifting assist/Pt. 50-74%/lift 2 legs);3: Supine > Sit: Mod A (lifting assist/Pt. 50-74%/lift 2 legs  FIM - Locomotion: Wheelchair Distance: 150' Locomotion: Wheelchair: 5: Travels 150 ft or more: maneuvers on rugs and over door sills with supervision, cueing or coaxing FIM - Locomotion: Ambulation Locomotion: Ambulation: 0: Activity did not occur (unsafe to attempt on eval)  Comprehension Comprehension Mode: Auditory Comprehension: 6-Follows complex conversation/direction: With extra time/assistive device  Expression Expression Mode: Verbal Expression: 6-Expresses complex ideas: With extra time/assistive device  Social Interaction Social Interaction: 6-Interacts appropriately with others with medication or extra time (anti-anxiety, antidepressant).  Problem Solving Problem Solving: 6-Solves complex problems: With extra time  Memory Memory: 6-More than reasonable amt of time   Medical Problem List and Plan:  1. DVT Prophylaxis/Anticoagulation: Pharmaceutical: Lovenox  2. Pain Management: reasonable. D/c mobic        3. Mood: motivated to get better.  4. FUO: Likely due to atelectasis. Encourage IS q 1 hour. Resolved  6. Spasticity: will increase baclofen to help with exacerbation of symptoms.  7. Contipation: resolved. Will schedule senna on prn basis   LOS (Days) 3 A FACE TO FACE EVALUATION WAS PERFORMED  Ferron Ishmael E 08/18/2011, 7:37 AM

## 2011-08-18 NOTE — Progress Notes (Signed)
Physical Therapy Session Note  Patient Details  Name: Andrew Sullivan MRN: [id number] Date of Birth: 1970/06/17  Today's Date: 08/18/2011 Time: 4098-1191 Time Calculation (min): 60 min  Short Term Goals: Week 1:  PT Short Term Goal 1 (Week 1): Pt will perform bed <> chair transfers with supervision PT Short Term Goal 2 (Week 1): Pt will perform static standing with max A for functional task PT Short Term Goal 3 (Week 1): Pt will gait 10' in controlled environment with max A  Skilled Therapeutic Interventions/Progress Updates:  Pt reports he is not in any pain.  Pt states he enjoyed therex on Saturday and was "able to feel my muscles firing" in his LLE however he reports that his LLE has now felt like "dead weight" and he can not feel any firing since Sunday.  Educated pt on muscle soreness, fatigue, and exercise limits.  Pt transferred bed-->w/c and self-propelled to gym with supervision.  Tx focused on standing in standing frame with emphasis on weight shifting, reaching with single UE while supporting with other UE, and dynamic balance to improve weight bearing tolerance, activity tolerance, endurance, and LE approximation.  Pt required rest breaks with standing activity and presented with RLE spasms as well as gastroc/soleus and hamstring tightness in standing with limited knee extension, hip extension, and ankle motion.  Pt req mod A to manage bilateral LE during sit-->supine transfer.  Stretching within hip precautions x bilateral LE hamstrings, adductors, and DF.  Increased tone (adductor) and spasms noted in RLE with stretching (contract-relax method).  Pt transferred back to w/c and returned to room.    Therapy Documentation Precautions:  Precautions Precautions: Posterior Hip;Back Restrictions Weight Bearing Restrictions: Yes LLE Weight Bearing: Weight bearing as tolerated  Pain: Pt denies pain with therapy and reports he does not want to take pain medication despite facial  expression of "strain" during standing in standing frame and wt shifting.  See FIM for current functional status  Therapy/Group: Individual Therapy  [name] 08/18/2011, 9:39 AM

## 2011-08-18 NOTE — Plan of Care (Signed)
Problem: RH BOWEL ELIMINATION Goal: RH STG MANAGE BOWEL W/MEDICATION W/ASSISTANCE STG Manage Bowel with Medication with Modified Independence.  Outcome: Progressing Pt currently on Docusate

## 2011-08-19 DIAGNOSIS — W010XXA Fall on same level from slipping, tripping and stumbling without subsequent striking against object, initial encounter: Secondary | ICD-10-CM

## 2011-08-19 DIAGNOSIS — Z5189 Encounter for other specified aftercare: Secondary | ICD-10-CM

## 2011-08-19 DIAGNOSIS — S72009A Fracture of unspecified part of neck of unspecified femur, initial encounter for closed fracture: Secondary | ICD-10-CM

## 2011-08-19 NOTE — Progress Notes (Signed)
Physical Therapy Session Note  Patient Details  Name: Andrew Sullivan MRN: [id number] Date of Birth: 08-16-1970  Today's Date: 08/19/2011 Time: 1400-1501 Time Calculation (min): 61 min  Short Term Goals: Week 1:  PT Short Term Goal 1 (Week 1): Pt will perform bed <> chair transfers with supervision PT Short Term Goal 2 (Week 1): Pt will perform static standing with max A for functional task PT Short Term Goal 3 (Week 1): Pt will gait 10' in controlled environment with max A  Skilled Therapeutic Interventions/Progress Updates:    Pt grimaces at times during PT however will not report pain, "Naw I'm good." Pain appears to be related to spasms.   Highly motivated individual, delightful to work with. Practiced various methods to increase independence with sit > supine while maintaining hip precautions, pt eventually able to perform with supervision. Stretching performed for bil. LEs within precautions to promote improved dorsiflexion with knee extension, hip flexion/knee flexion, avoiding internal rotation tone, and increased hamstring length. Tone inhibited by first ray elevation during dorsiflexion. Rest of session focused on increasing weight bearing tolerance of Lt. LE in standing frame. Performed ball toss and badminton decreasing UE reliance and increasing Lt. Weight shift. Pt reporting putting weight through LEs "really feels good."  All transfers mat <> wheelchair supervision.  Case management discussed expected discharge date with pt, pt appeared somewhat saddened by short length of stay - may need to address in next session.   Therapy Documentation Precautions:  Precautions Precautions: Posterior Hip;Fall Restrictions Weight Bearing Restrictions: Yes LLE Weight Bearing: Weight bearing as tolerated Locomotion : Wheelchair Mobility Distance: > 150   See FIM for current functional status  Therapy/Group: Individual Therapy  Wilhemina Bonito 08/19/2011, 5:28 PM

## 2011-08-19 NOTE — Progress Notes (Signed)
Reviewed and in agreement with treatment provided.  

## 2011-08-19 NOTE — Progress Notes (Signed)
Physical Therapy Session Note  Patient Details  Name: Andrew Sullivan MRN: [id number] Date of Birth: Jul 09, 1970  Today's Date: 08/19/2011 Time: 4098-1191 Time Calculation (min): 45 min  Short Term Goals: Week 1:  PT Short Term Goal 1 (Week 1): Pt will perform bed <> chair transfers with supervision PT Short Term Goal 2 (Week 1): Pt will perform static standing with max A for functional task PT Short Term Goal 3 (Week 1): Pt will gait 10' in controlled environment with max A  Skilled Therapeutic Interventions/Progress Updates:    At the beginning of session, pt reports that he has not yet received his Baclofen.  Increased RLE spasms, bilateral LE tone, and decreased flexibility noted through this morning session.  RN notified and pt brought Baclofen during therapy session.  Tx focused on sit to stand with RW from elevated surface.  Pt continues to require Max A +2 for transfer and presents with decreased weight bearing on bilateral LEs (LLE<RLE).  Tx emphasizes stretching bialteral LE DF (to neutral), knee extensors, and hip Add.  Educated pt on propping LEs throughout the day to encourage knee extension, especially LLE.  Able to stretch L hip flexors in sidelying to 140 deg using contract-relax method on powder board.  Tx focused on using powder board to encourage AAROM muscle activiation and promote activity tolerance and strengthening.  Pt able to activate hip flexors and hamstrings through partial ROM, no knee extension or ankle movement activation noted.  Therapy Documentation Precautions:  Precautions Precautions: Posterior Hip;Back Restrictions Weight Bearing Restrictions: Yes LLE Weight Bearing: Weight bearing as tolerated Pain: Pt reports he is not in any pain prior to therapy but feels "tightness" along L hip. Pt reports he does not want to take pain medication.  See FIM for current functional status  Therapy/Group: Individual Therapy  [name] 08/19/2011, 12:10 PM

## 2011-08-19 NOTE — Progress Notes (Signed)
Patient ID: Andrew Sullivan, male   DOB: [birth year], 41 y.o.   MRN: [id number] Subjective/Complaints: No sig hip pain.  Tolerating PT,OT  Objective: Vital Signs: Blood pressure 105/51, pulse 61, temperature 98.3 F (36.8 C), temperature source Oral, resp. rate 17, weight 76.8 kg (169 lb 5 oz), SpO2 98.00%. No results found. Results for orders placed during the hospital encounter of 08/15/11 (from the past 72 hour(s))  CBC     Status: Abnormal   Collection Time   08/18/11  6:50 AM      Component Value Range Comment   WBC 4.4  4.0 - 10.5 (K/uL)    RBC 3.53 (*) 4.22 - 5.81 (MIL/uL)    Hemoglobin 10.6 (*) 13.0 - 17.0 (g/dL)    HCT 46.9 (*) 62.9 - 52.0 (%)    MCV 89.2  78.0 - 100.0 (fL)    MCH 30.0  26.0 - 34.0 (pg)    MCHC 33.7  30.0 - 36.0 (g/dL)    RDW 52.8  41.3 - 24.4 (%)    Platelets 306  150 - 400 (K/uL)   COMPREHENSIVE METABOLIC PANEL     Status: Abnormal   Collection Time   08/18/11  6:50 AM      Component Value Range Comment   Sodium 140  135 - 145 (mEq/L)    Potassium 4.3  3.5 - 5.1 (mEq/L)    Chloride 105  96 - 112 (mEq/L)    CO2 27  19 - 32 (mEq/L)    Glucose, Bld 91  70 - 99 (mg/dL)    BUN 19  6 - 23 (mg/dL)    Creatinine, Ser 0.10  0.50 - 1.35 (mg/dL)    Calcium 8.6  8.4 - 10.5 (mg/dL)    Total Protein 6.2  6.0 - 8.3 (g/dL)    Albumin 2.7 (*) 3.5 - 5.2 (g/dL)    AST 28  0 - 37 (U/L)    ALT 22  0 - 53 (U/L)    Alkaline Phosphatase 56  39 - 117 (U/L)    Total Bilirubin 0.3  0.3 - 1.2 (mg/dL)    GFR calc non Af Amer >90  >90 (mL/min)    GFR calc Af Amer >90  >90 (mL/min)   DIFFERENTIAL     Status: Abnormal   Collection Time   08/18/11  6:50 AM      Component Value Range Comment   Neutrophils Relative 58  43 - 77 (%)    Neutro Abs 2.6  1.7 - 7.7 (K/uL)    Lymphocytes Relative 27  12 - 46 (%)    Lymphs Abs 1.2  0.7 - 4.0 (K/uL)    Monocytes Relative 8  3 - 12 (%)    Monocytes Absolute 0.4  0.1 - 1.0 (K/uL)    Eosinophils Relative 6 (*) 0 - 5 (%)    Eosinophils  Absolute 0.3  0.0 - 0.7 (K/uL)    Basophils Relative 0  0 - 1 (%)    Basophils Absolute 0.0  0.0 - 0.1 (K/uL)      HEENT: normal Cardio: RRR Resp: CTA B/L GI: BS positive Extremity:  No Edema Skin:   Other surgical incision L hip Neuro: Other see below Musc/Skel:  Swelling L thigh motor strength is 4/5 in the right deltoid, biceps, triceps,, grip 4 minus in the wrist extensor on the left side he is 4+ in the deltoid, biceps, triceps, grip, wrist extensor  Lower extremity strength is 2 minus bilateral quads with  support 0 at the ankle dorsiflexors and plantar flexors  Sensation is intact in the upper and lower extremity  Patient has clonus at bilateral ankles.  Hyperactive reflex in the right patellar.   Assessment/Plan: 1. Functional deficits secondary to L femoral neck fracture which require 3+ hours per day of interdisciplinary therapy in a comprehensive inpatient rehab setting. Physiatrist is providing close team supervision and 24 hour management of active medical problems listed below. Physiatrist and rehab team continue to assess barriers to discharge/monitor patient progress toward functional and medical goals. FIM: FIM - Bathing Bathing Steps Patient Completed: Chest;Right Arm;Left Arm;Abdomen;Front perineal area;Buttocks;Right upper leg;Left upper leg;Right lower leg (including foot);Left lower leg (including foot) Bathing: 6: Assistive device (Comment) (seated)  FIM - Upper Body Dressing/Undressing Upper body dressing/undressing steps patient completed: Thread/unthread right sleeve of pullover shirt/dresss;Thread/unthread left sleeve of pullover shirt/dress;Put head through opening of pull over shirt/dress;Pull shirt over trunk Upper body dressing/undressing: 5: Set-up assist to: Obtain clothing/put away FIM - Lower Body Dressing/Undressing Lower body dressing/undressing steps patient completed: Thread/unthread right underwear leg;Thread/unthread left underwear leg;Pull  underwear up/down;Thread/unthread right pants leg;Thread/unthread left pants leg;Pull pants up/down Lower body dressing/undressing: 4: Min-Patient completed 75 plus % of tasks  FIM - Toileting Toileting steps completed by patient: Adjust clothing prior to toileting Toileting Assistive Devices: Grab bar or rail for support Toileting: 4: Steadying assist  FIM - Diplomatic Services operational officer Devices: Grab bars Toilet Transfers: 4-From toilet/BSC: Min A (steadying Pt. > 75%);4-To toilet/BSC: Min A (steadying Pt. > 75%)  FIM - Bed/Chair Transfer Bed/Chair Transfer Assistive Devices: Arm rests Bed/Chair Transfer: 5: Supine > Sit: Supervision (verbal cues/safety issues);3: Sit > Supine: Mod A (lifting assist/Pt. 50-74%/lift 2 legs);5: Bed > Chair or W/C: Supervision (verbal cues/safety issues);5: Chair or W/C > Bed: Supervision (verbal cues/safety issues)  FIM - Locomotion: Wheelchair Distance: 150' Locomotion: Wheelchair: 5: Travels 150 ft or more: maneuvers on rugs and over door sills with supervision, cueing or coaxing FIM - Locomotion: Ambulation Locomotion: Ambulation: 0: Activity did not occur  Comprehension Comprehension Mode: Auditory Comprehension: 7-Follows complex conversation/direction: With no assist  Expression Expression Mode: Verbal Expression: 7-Expresses complex ideas: With no assist  Social Interaction Social Interaction: 7-Interacts appropriately with others - No medications needed.  Problem Solving Problem Solving: 7-Solves complex problems: Recognizes & self-corrects  Memory Memory: 7-Complete Independence: No helper   Medical Problem List and Plan:  1. DVT Prophylaxis/Anticoagulation: Pharmaceutical: Lovenox  2. Pain Management: reasonable. D/c mobic        3. Mood: motivated to get better.  4. FUO: Likely due to atelectasis. Encourage IS q 1 hour. Resolved  6. Spasticity: will increase baclofen to help with exacerbation of symptoms.  7.  Contipation: resolved. Will schedule senna on prn basis 8.  Incomplete cervical spinal cord injury, chronic  LOS (Days) 4 A FACE TO FACE EVALUATION WAS PERFORMED  Andrew Sullivan E 08/19/2011, 7:28 AM

## 2011-08-19 NOTE — Progress Notes (Addendum)
Occupational Therapy Session Note  Patient Details  Name: Andrew Sullivan MRN: [id number] Date of Birth: [birth year]  Today's Date: 08/19/2011 Time: 1000-1100 Time Calculation (min): 60 min  Short Term Goals: Week 1:  OT Short Term Goal 1 (Week 1): pt. will transfer to toilet with supervision OT Short Term Goal 2 (Week 1): Pt. will transfer to tub bench with supervision OT Short Term Goal 3 (Week 1): Pt. will use AE with set up  Skilled Therapeutic Interventions/Progress Updates:  Addendum - No complaints of pain Skilled intervention with emphasis on ADL retraining at shower level. Patient performed at an overall supervision -> min assist level for UB/LB bathing & dressing and shower stall transfer onto shower seat. Focused intervention on increasing overall activity tolerance/endurance, LB ADLs with introduction and return demonstration of AE (reacher and sock aid), shower stall transfer, w/c management/propulsion/maneuvering throughout room and bathroom, and dynamic sitting balance/tolerance/endurance. Patient left seated in w/c beside bed with call bell & phone within reach.  Precautions:  Precautions Precautions: Posterior Hip;Back Restrictions Weight Bearing Restrictions: Yes LLE Weight Bearing: Weight bearing as tolerated  See FIM for current functional status  Therapy/Group: Individual Therapy  Hoa Briggs 08/19/2011, 12:00 PM

## 2011-08-19 NOTE — Progress Notes (Signed)
Occupational Therapy Session Note  Patient Details  Name: Andrew Sullivan MRN: [id number] Date of Birth: 08-13-1970  Today's Date: 08/19/2011 Time: 2952-8413 Time Calculation (min): 35 min  Short Term Goals: Week 1:  OT Short Term Goal 1 (Week 1): pt. will transfer to toilet with supervision OT Short Term Goal 2 (Week 1): Pt. will transfer to tub bench with supervision OT Short Term Goal 3 (Week 1): Pt. will use AE with set up  Skilled Therapeutic Interventions: Therapeutic activities with emphasis on transfers wc <> bed, and use of AE for lower body dressing.   Patient educated on use of shoe posts and reacher to improve don//doffing shoes with laces.   Patient able to don/doff socks using sock aid and reacher, independently, although he is not certain he will be acquiring these tools prior to d/c.   Educated patient on low-load prolonged stretch to improve right knee extension, using leg extension leg-rest on w/c and theraband or light ankle weight (padded) while supine in bed.   Patient reports right knee contracture is limiting proficiency with transfers which currently requires min assist from caregiver to manage right LE.     Therapy Documentation Precautions:  Precautions Precautions: Posterior Hip;Fall Restrictions Weight Bearing Restrictions: Yes LLE Weight Bearing: Weight bearing as tolerated  Pain: Pain Assessment Pain Assessment: No/denies pain  See FIM for current functional status  Therapy/Group: Individual Therapy  Georgeanne Nim 08/19/2011, 1:43 PM

## 2011-08-20 NOTE — Progress Notes (Signed)
Occupational Therapy Session Notes  Patient Details  Name: Andrew Sullivan MRN: [id number] Date of Birth: [birth year]  Today's Date: 08/20/2011  Short Term Goals: Week 1:  OT Short Term Goal 1 (Week 1): pt. will transfer to toilet with supervision OT Short Term Goal 2 (Week 1): Pt. will transfer to tub bench with supervision OT Short Term Goal 3 (Week 1): Pt. will use AE with set up  Skilled Therapeutic Interventions/Progress Updates:   Session #1 0930-1030 - 60 Minutes Individual Therapy No complaints of pain Treatment emphasis on ADL retraining at shower level. Focused skilled intervention on overall activity tolerance/endurance, functional ADL transfers, UB/LB bathing & dressing with use of AE prn, and lateral leans to pull pants up to waist seated in w/c. Patient able to perform UB/LB bathing at mod I level, UB dressing independently, and LB dressing with supervsion. Patient with more than reasonable amount of time to complete ADL, but very motivated to be independent. Left patient in w/c with call bell & phone within reach.   Session #2 1430-1500 - 30 Minutes Individual Therapy No complaints of pain Treatment emphasis on tub/shower transfers on/off tub transfer bench. Patient managed legs in/out of tub/sower X3 times; first time patient required min assist, other two times patient able to complete with supervision. Patient performed w/c <-> bench with supervision. Focused skilled intervention on transfers, overall activity tolerance/endurance, and pain management.   Precautions:  Precautions Precautions: Posterior Hip;Fall Restrictions Weight Bearing Restrictions: Yes LLE Weight Bearing: Weight bearing as tolerated  See FIM for current functional status  [name] 08/20/2011, 10:06 AM

## 2011-08-20 NOTE — Progress Notes (Signed)
Patient ID: Andrew Sullivan, male   DOB: May 13, 1970, 41 y.o.   MRN: [id number] Subjective/Complaints: No sig hip pain.  Tolerating PT,OT Spasticty stil interfering with PT/OT but pt declines an increase in baclofen Objective: Vital Signs: Blood pressure 100/60, pulse 70, temperature 98.3 F (36.8 C), temperature source Oral, resp. rate 17, weight 78.2 kg (172 lb 6.4 oz), SpO2 98.00%. No results found. Results for orders placed during the hospital encounter of 08/15/11 (from the past 72 hour(s))  CBC     Status: Abnormal   Collection Time   08/18/11  6:50 AM      Component Value Range Comment   WBC 4.4  4.0 - 10.5 K/uL    RBC 3.53 (*) 4.22 - 5.81 MIL/uL    Hemoglobin 10.6 (*) 13.0 - 17.0 g/dL    HCT 14.7 (*) 82.9 - 52.0 %    MCV 89.2  78.0 - 100.0 fL    MCH 30.0  26.0 - 34.0 pg    MCHC 33.7  30.0 - 36.0 g/dL    RDW 56.2  13.0 - 86.5 %    Platelets 306  150 - 400 K/uL   COMPREHENSIVE METABOLIC PANEL     Status: Abnormal   Collection Time   08/18/11  6:50 AM      Component Value Range Comment   Sodium 140  135 - 145 mEq/L    Potassium 4.3  3.5 - 5.1 mEq/L    Chloride 105  96 - 112 mEq/L    CO2 27  19 - 32 mEq/L    Glucose, Bld 91  70 - 99 mg/dL    BUN 19  6 - 23 mg/dL    Creatinine, Ser 7.84  0.50 - 1.35 mg/dL    Calcium 8.6  8.4 - 69.6 mg/dL    Total Protein 6.2  6.0 - 8.3 g/dL    Albumin 2.7 (*) 3.5 - 5.2 g/dL    AST 28  0 - 37 U/L    ALT 22  0 - 53 U/L    Alkaline Phosphatase 56  39 - 117 U/L    Total Bilirubin 0.3  0.3 - 1.2 mg/dL    GFR calc non Af Amer >90  >90 mL/min    GFR calc Af Amer >90  >90 mL/min   DIFFERENTIAL     Status: Abnormal   Collection Time   08/18/11  6:50 AM      Component Value Range Comment   Neutrophils Relative 58  43 - 77 %    Neutro Abs 2.6  1.7 - 7.7 K/uL    Lymphocytes Relative 27  12 - 46 %    Lymphs Abs 1.2  0.7 - 4.0 K/uL    Monocytes Relative 8  3 - 12 %    Monocytes Absolute 0.4  0.1 - 1.0 K/uL    Eosinophils Relative 6 (*) 0 - 5 %      Eosinophils Absolute 0.3  0.0 - 0.7 K/uL    Basophils Relative 0  0 - 1 %    Basophils Absolute 0.0  0.0 - 0.1 K/uL      HEENT: normal Cardio: RRR Resp: CTA B/L GI: BS positive Extremity:  No Edema Skin:   Other surgical incision L hip Neuro: Other see below Musc/Skel:  Swelling L thigh motor strength is 4/5 in the right deltoid, biceps, triceps,, grip 4 minus in the wrist extensor on the left side he is 4+ in the deltoid, biceps, triceps, grip,  wrist extensor  Lower extremity strength is 2 minus bilateral quads with support 0 at the ankle dorsiflexors and plantar flexors  Sensation is intact in the upper and lower extremity  Patient has clonus at bilateral ankles.  Hyperactive reflex in the right patellar.   Assessment/Plan: 1. Functional deficits secondary to L femoral neck fracture which require 3+ hours per day of interdisciplinary therapy in a comprehensive inpatient rehab setting. Physiatrist is providing close team supervision and 24 hour management of active medical problems listed below. Physiatrist and rehab team continue to assess barriers to discharge/monitor patient progress toward functional and medical goals. FIM: FIM - Bathing Bathing Steps Patient Completed: Chest;Right Arm;Left Arm;Abdomen;Front perineal area;Buttocks;Right upper leg;Left upper leg;Right lower leg (including foot);Left lower leg (including foot) Bathing: 6: Assistive device (Comment)  FIM - Upper Body Dressing/Undressing Upper body dressing/undressing steps patient completed: Thread/unthread left sleeve of pullover shirt/dress;Thread/unthread right sleeve of pullover shirt/dresss;Put head through opening of pull over shirt/dress;Pull shirt over trunk Upper body dressing/undressing: 7: Complete Independence: No helper FIM - Lower Body Dressing/Undressing Lower body dressing/undressing steps patient completed: Thread/unthread right underwear leg;Thread/unthread left underwear leg;Pull underwear  up/down;Thread/unthread right pants leg;Thread/unthread left pants leg;Pull pants up/down;Don/Doff right sock;Don/Doff left sock Lower body dressing/undressing: 4: Steadying Assist  FIM - Toileting Toileting steps completed by patient: Adjust clothing prior to toileting Toileting Assistive Devices: Grab bar or rail for support Toileting: 4: Steadying assist  FIM - Diplomatic Services operational officer Devices: Grab bars Toilet Transfers: 4-From toilet/BSC: Min A (steadying Pt. > 75%);4-To toilet/BSC: Min A (steadying Pt. > 75%)  FIM - Bed/Chair Transfer Bed/Chair Transfer Assistive Devices: Arm rests Bed/Chair Transfer: 4: Sit > Supine: Min A (steadying pt. > 75%/lift 1 leg);5: Supine > Sit: Supervision (verbal cues/safety issues);5: Bed > Chair or W/C: Supervision (verbal cues/safety issues);5: Chair or W/C > Bed: Supervision (verbal cues/safety issues)  FIM - Locomotion: Wheelchair Distance: > 150 Locomotion: Wheelchair: 6: Travels 150 ft or more, turns around, maneuvers to table, bed or toilet, negotiates 3% grade: maneuvers on rugs and over door sills independently FIM - Locomotion: Ambulation Locomotion: Ambulation Assistive Devices: Walker - Rolling Locomotion: Ambulation: 0: Activity did not occur (stood with RW Max A +2; majority of WB on UE, not LE)  Comprehension Comprehension Mode: Auditory Comprehension: 7-Follows complex conversation/direction: With no assist  Expression Expression Mode: Verbal Expression: 7-Expresses complex ideas: With no assist  Social Interaction Social Interaction: 7-Interacts appropriately with others - No medications needed.  Problem Solving Problem Solving: 7-Solves complex problems: Recognizes & self-corrects  Memory Memory: 7-Complete Independence: No helper   Medical Problem List and Plan:  1. DVT Prophylaxis/Anticoagulation: Pharmaceutical: Lovenox  2. Pain Management: reasonable. D/c mobic        3. Mood: motivated to get  better.  4. FUO: Likely due to atelectasis. Encourage IS q 1 hour. Resolved  6. Spasticity: will increase baclofen to help with exacerbation of symptoms.  7. Constipation: resolved. Will schedule senna on prn basis 8.  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

## 2011-08-20 NOTE — Progress Notes (Signed)
Physical Therapy Session Note  Patient Details  Name: Andrew Sullivan MRN: [id number] Date of Birth: 1970-04-19  Today's Date: 08/20/2011  Short Term Goals: Week 1:  PT Short Term Goal 1 (Week 1): Pt will perform bed <> chair transfers with supervision PT Short Term Goal 2 (Week 1): Pt will perform static standing with max A for functional task PT Short Term Goal 3 (Week 1): Pt will gait 10' in controlled environment with max A      Therapy Documentation Precautions:  Precautions Precautions: Posterior Hip;Fall Restrictions Weight Bearing Restrictions: Yes LLE Weight Bearing: Weight bearing as tolerated  See FIM for current functional status  Therapy/Group: Individual Therapy  Skilled Therapy Session #1: Time:  830-930 (60 min) Individual therapy session.  Pt continues to deny that he is in pain but grimaces with therapy.  After one grimace, pt admitted to 8/10 pain but continued to deny wanting pain medication.  Pt reports that he still had not received Baclofen prior to therapy session.  RN notified and brought pt med during session.  Therapist demonstrated stretches for tech so that pt could be placed on stretching schedules with tech while obeying hip precautions and controlling spasms/tone.  Pt presented with increased clonus, tone, and spasms with stretching.  Educated pt on various ways to stretch independently when not in therapy while obeying precautions.  Stretched hip flexors, hip adductors, hip extensors, knee flexors, and DF of bilateral LE.  Remainder of session focused on weight shifting, weight bearing, and dynamic balance with reaching in standing in standing frame.   Skilled Therapy Session #2: Time: 4098-1191 Time Calculation (min): 45 min  Individual session.  No c/o pain with therapy. Tx session involved discussion with therapist and pt regarding previous mobility safety, prior ambulation, and  w/c rental vs. custom w/c for mobility at discharge.  Pt decided  that he would like his own custom w/c to use for mobility purposes stating "I don't know what the future holds" incase he does not reach safe ambulatory status in the future.  Tx focused on standing from elevated mat using RW with total A+2 to improve WB through LEs and activity tolerance.  Pt maintained knee flexion of L knee, unable to extend knee with PROM in standing.  Pt continues to primarily weight bear on UEs, clonus present in RLE and spasms and adductor tone evident in bilateral LE.  Pt stood X2 with adductor wedge propped between LEs to prevent adduction, pt still unable to reach knee extension to neutral.  Stood in standing frame to promote weight bearing, weight shifting, terminal sit to stand with anterior/posterior pelvic control, and glute activation.  Pt able to activate glutes with facilitation.     [name] 08/20/2011, 12:12 PM

## 2011-08-20 NOTE — Progress Notes (Signed)
Reviewed and in agreement with treatment provided.  

## 2011-08-21 NOTE — Patient Care Conference (Signed)
Inpatient RehabilitationTeam Conference Note Date: 08/19/2011   Time: 1:15 PM    Patient Name: Andrew Sullivan      Medical Record Number: [id number]  Date of Birth: 08-12-1970 Sex: Male         Room/Bed: 4006/4006-01 Payor Info: Payor: MEDICARE  Plan: MEDICARE PART A AND B  Product Type: *No Product type*     Admitting Diagnosis: L HIP FX, S/P HEMI  Admit Date/Time:  08/15/2011  1:19 PM Admission Comments: No comment available   Primary Diagnosis:  Hip fracture, left Principal Problem: Hip fracture, left  Patient Active Problem List   Diagnosis Date Noted  . Hip fracture, left 08/15/2011  . Abscess of axilla, left 09/06/2010  . Irritant contact dermatitis 06/06/2010  . History of recurrent UTIs 05/19/2010  . Leg pain, bilateral 05/10/2010  . hemiparesis 05/10/2009  . HEMATURIA 07/29/2007  . VERTEBRAL FRACTURE, CERVICAL SPINE 07/29/2007    Expected Discharge Date: Expected Discharge Date: 08/26/11  Team Members Present: Physician: Dr. Claudette Laws Case Manager Present: Melanee Spry, RN Social Worker Present: Amada Jupiter, LCSW Nurse Present: Daryll Brod, RN PT Present: [name], PT;Becky Henrene Dodge, PT;Other (comment) Rose Fillers) OT Present: Edwin Cap, OT SLP Present: Myra Rude, SLP Other (Discipline and Name): Tora Duck, PPS Coordinator     Current Status/Progress Goal Weekly Team Focus  Medical   left hip pain well controlled on minimal medications, increased tone in the lower remedies  improve spasticity  stretching with possible medication adjustments   Bowel/Bladder   Continent of bowel and bladder  Continent of bowel and bladder  Monitor    Swallow/Nutrition/ Hydration             ADL's   Overall supervision -> steady assist  supervision -> mod I goals  ADL retraining, overall activity tolerance/endurance, overall functional strength, ADL transfers   Mobility   overall supervision at w/c level; Mod A sit-->supine; Max A sit-->stands   Mod I w/c level; Min A ambulation with RW  functional strengthening, activity tolerance, balance training, gait training, flexibility   Communication             Safety/Cognition/ Behavioral Observations            Pain   No c/o pain  <3  Montior    Skin   L hip incision with dressing  c.d.i  No additional skin breakdown  Routine q 2hrs      *See Interdisciplinary Assessment and Plan and progress notes for long and short-term goals  Barriers to Discharge: see above    Possible Resolutions to Barriers:  PT, OT, M.D. to address program as well as medication    Discharge Planning/Teaching Needs:  home with parents who both work days - modified independent at w/c level      Team Discussion: Goals are w/c level.  Need to discuss home's w/c availability.   Revisions to Treatment Plan: none    Continued Need for Acute Rehabilitation Level of Care: The patient requires daily medical management by a physician with specialized training in physical medicine and rehabilitation for the following conditions: Daily direction of a multidisciplinary physical rehabilitation program to ensure safe treatment while eliciting the highest outcome that is of practical value to the patient.: Yes Daily medical management of patient stability for increased activity during participation in an intensive rehabilitation regime.: Yes Daily analysis of laboratory values and/or radiology reports with any subsequent need for medication adjustment of medical intervention for : Post surgical problems  Brock Ra 08/21/2011, 2:15 PM

## 2011-08-21 NOTE — Progress Notes (Signed)
Reviewed and in agreement with treatment provided.  

## 2011-08-21 NOTE — Plan of Care (Signed)
Problem: RH SAFETY Goal: RH STG ADHERE TO SAFETY PRECAUTIONS W/ASSISTANCE/DEVICE STG Adhere to Safety Precautions With Assistance/Device. Supervision  Outcome: Progressing Requires reminders at time to notify staff for transfer assist

## 2011-08-21 NOTE — Progress Notes (Signed)
Physical Therapy Session Note  Patient Details  Name: Andrew Sullivan MRN: [id number] Date of Birth: 10/30/1970  Today's Date: 08/21/2011 Time: 4098-1191 Time Calculation (min):25 min  Short Term Goals: Week 1:  PT Short Term Goal 1 (Week 1): Pt will perform bed <> chair transfers with supervision PT Short Term Goal 2 (Week 1): Pt will perform static standing with max A for functional task PT Short Term Goal 3 (Week 1): Pt will gait 10' in controlled environment with max A     Skilled Therapeutic Interventions/Progress Updates:   Dressing applied to dry, healed wound per pt's request, for padding during PT.  W/c >< bed squat/scoot transfer to L and R, with supervision.  Discussed hip precautions  Instructed in options for self-stretching LLE, particularly L hip flexor.  Pt experiences frequent muscle spasms L hip flexors, causing pain, and is anxious to be able to control them himself.  PROM (via positioning) bil hips in supine propped on elbows slowly progressing to supine, with flexed knees, feet under him. This position elicited no muscle spasms, but pt was only able to tolerate for 3-4 minutes.    Pt also instructed in diaphragmatic breathing to decrease anxiety.     Therapy Documentation Precautions:  Precautions Precautions: Posterior Hip Restrictions Weight Bearing Restrictions: Yes LLE Weight Bearing: Weight bearing as tolerated General:   Vital Signs:   Pain: Pain Assessment Pain Assessment: No/denies pain Mobility:   Locomotion : Ambulation Ambulation/Gait Assistance: 1: +2 Total assist  Trunk/Postural Assessment :    Balance:   Exercises:   Other Treatments:    See FIM for current functional status  Therapy/Group: Individual Therapy  [name] 08/21/2011, 4:12 PM

## 2011-08-21 NOTE — Progress Notes (Signed)
Physical Therapy Session Note  Patient Details  Name: Andrew Sullivan MRN: [id number] Date of Birth: [birth year]  Today's Date: 08/21/2011 Time: 0865-7846 Time Calculation (min): 60 min  Short Term Goals: Week 1:  PT Short Term Goal 1 (Week 1): Pt will perform bed <> chair transfers with supervision PT Short Term Goal 2 (Week 1): Pt will perform static standing with max A for functional task PT Short Term Goal 3 (Week 1): Pt will gait 10' in controlled environment with max A  Skilled Therapeutic Interventions/Progress Updates:    Pt was received finishing stretching program with therapy tech.  Pt reports stretching knee flexors by propping LEs straight in front of him for 2 hours yesterday.  Pt states that he flex this stretching helped yesterday but when he woke up this morning, he was back to being tight.  Specifically c/o hip flexor tightness.  Educated pt on proper stretching, continuity of stretching, and ways to stretch independently.  Pt reports receiving pain medication and Baclofen prior to therapy.  Tx focused on standing and weight shifting in sara plus lift.  Pt required total assist from lift to stand as well as assist from therapists to maintain foot-ground contact as pt's tone would cause knees and hips to flex, lifting feet off of ground.  With total assist for LE advancement, pt amb 20' in sara plus with 4 rest breaks.  Presents with decreased WB on bilateral LEs (L < R).  Decreased WB in LLE stance requires max A for RLE advancement. Pt is able to contract hamstrings and pull R foot backwards, but demonstrates Filkins to no quadricept activation to advance LEs forward.  Though pt is able to shift more weight over RLE, pt continues to need MaxA for LLE advancement due to decreased strength and muscle coordination.  Pt provided with verbal cues for weight shifting, sequencing, and LE advancement as well as rhythmic stabilization for quadricept activation to initiate swing phase.  Pt  practiced glute activation and posterior pelvic rotation for terminal sit to stand with verbal, tactile, and visual cues from mirror.  Pt maintains hip flexion in standing however due to poor pelvic control, decreased muscle strength, and decreased ROM.  Therapy Documentation Precautions:  Precautions Precautions: Posterior Hip;Fall Restrictions Weight Bearing Restrictions: Yes LLE Weight Bearing: Weight bearing as tolerated  Pain: Pt reports that he received pain medication prior to therapy this am.  No c/o pain during session though pt continues to grimace at times.  States "i'm good" when questioned about pain however.  See FIM for current functional status  Therapy/Group: Individual Therapy  [name] 08/21/2011, 1:14 PM

## 2011-08-21 NOTE — Progress Notes (Signed)
Physical Therapy Session Note  Patient Details  Name: Andrew Sullivan MRN: [id number] Date of Birth: 1970-03-13  Today's Date: 08/21/2011 Time: 1415-1500 Time Calculation (min): 45 min  Short Term Goals: Week 1:  PT Short Term Goal 1 (Week 1): Pt will perform bed <> chair transfers with supervision PT Short Term Goal 2 (Week 1): Pt will perform static standing with max A for functional task PT Short Term Goal 3 (Week 1): Pt will gait 10' in controlled environment with max A  Skilled Therapeutic Interventions/Progress Updates:    Pt reports stretching hip flexors independently while in bed but that he felt they were still tight.  Pt chose to continue to focus on standing and weight bearing in sara plus during session.  Though motivated, pt fatigued quickly in sara plus this pm and became frustrated.  Discussed the importance of rest and alternative exercises with patient.  Focused on PROM of knee ext, with AROM of knee flexion and hip flexion on powder board-pt unable to activate quadriceps with rhythmic stabilization. Pt required min A to roll R side lying to prone in order to follow L hip precautions.  Once in prone, pt stretched hip flexors (L tighter than R) in prone lying and prone on elbows.  Pt unable to assume prone on elbows while maintaining hip contact on mat due to hip flexor tightness.  Therapist performed STM to L hip flexors in supine to encourage increased flexibility and hip ROM.  Discussed the benefits of heat and stretching with pt.  Therapy Documentation Precautions:  Precautions Precautions: Posterior Hip Restrictions Weight Bearing Restrictions: Yes LLE Weight Bearing: Weight bearing as tolerated  Pain:No c/o pain during therapy.  See FIM for current functional status  Therapy/Group: Individual Therapy  [name] 08/21/2011, 4:12 PM

## 2011-08-21 NOTE — Progress Notes (Signed)
Social Work Patient ID: Andrew Sullivan, male   DOB: 14-Jun-1970, 41 y.o.   MRN: [id number]  Met with patient yesterday afternoon to discuss d/c planning issues.  Initially, pt was discussing DME needs, however, then asked about "that other option... Going to another place".  Clarified with pt that he had spoken with ?acute SW about possibility of SNF prior to coming to CIR.  He reports he does not feel ready or safe to be at home alone during the day (goals were mod i at w/c level) given his prior falls and that he would like to pursue SNF from here.  Tx team made aware of pt's wishes.  Will complete and send out FL2 today.  Aarian Griffie

## 2011-08-21 NOTE — Progress Notes (Signed)
Patient ID: BRAXTEN MEMMER, male   DOB: 02-26-1971, 41 y.o.   MRN: [id number] Patient ID: LEODIS ALCOCER, male   DOB: Jul 11, 1970, 41 y.o.   MRN: [id number] Subjective/Complaints: No sig hip pain.  Tolerating PT,OT Spasticty a problem with therapies. Pt doesn't want changes in his meds- it sounds as if spasticity was an ongoing issue at home PTA Objective: Vital Signs: Blood pressure 119/64, pulse 62, temperature 98 F (36.7 C), temperature source Oral, resp. rate 18, weight 78.2 kg (172 lb 6.4 oz), SpO2 97.00%. No results found. No results found for this or any previous visit (from the past 72 hour(s)).   HEENT: normal Cardio: RRR Resp: CTA B/L GI: BS positive Extremity:  No Edema Skin:   Other surgical incision L hip Neuro: Other see below Musc/Skel:  Swelling L thigh motor strength is 4/5 in the right deltoid, biceps, triceps,, grip 4 minus in the wrist extensor on the left side he is 4+ in the deltoid, biceps, triceps, grip, wrist extensor  Lower extremity strength is 2 minus bilateral quads with support 0 at the ankle dorsiflexors and plantar flexors  Sensation is intact in the upper and lower extremity  Patient has clonus at bilateral ankles.  Hyperactive reflex in the right patellar. Tone 1+ to 2 in left lower extremity   Assessment/Plan: 1. Functional deficits secondary to L femoral neck fracture which require 3+ hours per day of interdisciplinary therapy in a comprehensive inpatient rehab setting. Physiatrist is providing close team supervision and 24 hour management of active medical problems listed below. Physiatrist and rehab team continue to assess barriers to discharge/monitor patient progress toward functional and medical goals. FIM: FIM - Bathing Bathing Steps Patient Completed: Chest;Right Arm;Left Arm;Abdomen;Front perineal area;Buttocks;Right upper leg;Left upper leg;Right lower leg (including foot);Left lower leg (including foot) Bathing: 6: Assistive device  (Comment)  FIM - Upper Body Dressing/Undressing Upper body dressing/undressing steps patient completed: Thread/unthread right sleeve of pullover shirt/dresss;Thread/unthread left sleeve of pullover shirt/dress;Put head through opening of pull over shirt/dress;Pull shirt over trunk Upper body dressing/undressing: 7: Complete Independence: No helper FIM - Lower Body Dressing/Undressing Lower body dressing/undressing steps patient completed: Thread/unthread right underwear leg;Thread/unthread left underwear leg;Pull underwear up/down;Thread/unthread right pants leg;Thread/unthread left pants leg;Pull pants up/down;Don/Doff right sock;Don/Doff left sock Lower body dressing/undressing: 5: Supervision: Safety issues/verbal cues  FIM - Toileting Toileting steps completed by patient: Adjust clothing prior to toileting Toileting Assistive Devices: Grab bar or rail for support Toileting: 4: Steadying assist  FIM - Diplomatic Services operational officer Devices: Grab bars Toilet Transfers: 4-To toilet/BSC: Min A (steadying Pt. > 75%);4-From toilet/BSC: Min A (steadying Pt. > 75%)  FIM - Bed/Chair Transfer Bed/Chair Transfer Assistive Devices: Arm rests Bed/Chair Transfer: 5: Supine > Sit: Supervision (verbal cues/safety issues);5: Sit > Supine: Supervision (verbal cues/safety issues);5: Bed > Chair or W/C: Supervision (verbal cues/safety issues);5: Chair or W/C > Bed: Supervision (verbal cues/safety issues)  FIM - Locomotion: Wheelchair Distance: > 150 Locomotion: Wheelchair: 6: Travels 150 ft or more, turns around, maneuvers to table, bed or toilet, negotiates 3% grade: maneuvers on rugs and over door sills independently FIM - Locomotion: Ambulation Locomotion: Ambulation Assistive Devices: Other (comment);Walker - Rolling (standing frame) Ambulation/Gait Assistance: 1: +2 Total assist Locomotion: Ambulation: 0: Activity did not occur (pt stood with total assist in lift and in  RW)  Comprehension Comprehension Mode: Auditory Comprehension: 7-Follows complex conversation/direction: With no assist  Expression Expression Mode: Verbal Expression: 7-Expresses complex ideas: With no assist  Social Interaction Social Interaction:  7-Interacts appropriately with others - No medications needed.  Problem Solving Problem Solving: 7-Solves complex problems: Recognizes & self-corrects  Memory Memory: 7-Complete Independence: No helper   Medical Problem List and Plan:  1. DVT Prophylaxis/Anticoagulation: Pharmaceutical: Lovenox  2. Pain Management: reasonable. D/c'ed mobic        3. Mood: motivated to get better.  4. FUO: ?atelectasis== Resolved  6. Spasticity: baclofen- would benefit from an increase, but he's fairly set in his ways and how he wants things to be..  7. Constipation: resolved. Will schedule senna on prn basis 8.  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

## 2011-08-21 NOTE — Care Management Note (Signed)
Patient ID: Andrew Sullivan, male   DOB: [birth year], 41 y.o.   MRN: [id number] Pt given team conf report Tuesday afternoon: ELOS 08/26/11 Mod I w/c level.  He is concerned about going home this soon & at this level.  Requests d/c to be SNF.  It turns out pt has Medicare A & B as well as Medicaid, so he could continue txs at SNF.

## 2011-08-21 NOTE — Progress Notes (Signed)
Occupational Therapy Session Note  Patient Details  Name: Andrew Sullivan MRN: [id number] Date of Birth: 1970/09/10  Today's Date: 08/21/2011 Time: 0930-1030 Time Calculation (min): 60 min  Short Term Goals: Week 1:  OT Short Term Goal 1 (Week 1): pt. will transfer to toilet with supervision OT Short Term Goal 2 (Week 1): Pt. will transfer to tub bench with supervision OT Short Term Goal 3 (Week 1): Pt. will use AE with set up  Skilled Therapeutic Interventions/Progress Updates:  No complaints of pain Patient performed ADL retraining at shower level at overall modified independent level for transfers, bathing, UB dressing, and set-up assist for LB dressing secondary to therapist donning TED hose. Patient with more than reasonable amount of time to complete ADL tasks, but motivated to be independent and complete tasks on his own. Patient demonstrates safe and effective transfers and all ADL tasks at this time. Therapist marked on safety plan that patient mod I for showering secondary to patient requested to shower at night prior to bed.   Precautions:  Precautions Precautions: Posterior Hip;Fall Restrictions Weight Bearing Restrictions: Yes LLE Weight Bearing: Weight bearing as tolerated  See FIM for current functional status  Therapy/Group: Individual Therapy  Kurstin Dimarzo 08/21/2011, 10:36 AM

## 2011-08-21 NOTE — Progress Notes (Signed)
Alert and orientated x 3. Remains continent of bowel and bladder. Last bowel movement 08/20/11. Independent with the urinal.placement. However, requires staff to remove and empty. R leg with visibly spasms. Pt on scheduled baclofen. Requested Vicodin x 2 prior to 0800 therapy sessions. Pt reported pain effectiveness, however, did experience some nausea which self-resolved with ice chips. Calls appropriately for assistance this shift.

## 2011-08-22 DIAGNOSIS — Z5189 Encounter for other specified aftercare: Secondary | ICD-10-CM

## 2011-08-22 DIAGNOSIS — W010XXA Fall on same level from slipping, tripping and stumbling without subsequent striking against object, initial encounter: Secondary | ICD-10-CM

## 2011-08-22 DIAGNOSIS — S72009A Fracture of unspecified part of neck of unspecified femur, initial encounter for closed fracture: Secondary | ICD-10-CM

## 2011-08-22 NOTE — Progress Notes (Signed)
Physical Therapy Weekly Progress Note  Patient Details  Name: Andrew Sullivan MRN: [id number] Date of Birth: September 06, 1970  Today's Date: 08/22/2011  Patient has met 2 of 3 short term goals.  Pt has been very motivated to participate in therapy, making excellent progress at w/c level now at mod I level for transfers and w/c mobility. Due to increased spasticity, tone, and spasms in bilateral LEs, standing/pre-gait/gait has been limited and pt is requiring use of standing frame or Sara Plus. Pt has been working on standing, stretching/ROM, and neuro- re-education for lower extremities in addition to basic strengthening and activity tolerance activities. Pt's d/c plan has changed to SNF for short term placement per pt request due decreased caregiver support available at home.   Specialty w/c evaluation completed today with [name] from [employer] and [facility] for best possible w/c option for pt in regards to pt's postural alignment & positioning, pressure relief, tone, activity tolerance/energy expenditure in order to maximize independence in the home and community up d/c.  Patient continues to demonstrate the following deficits: decreased balance, paraparesis, abnormal tone, decreased motor control and strength, pain, decreased postural control and therefore will continue to benefit from skilled PT intervention to enhance overall performance with balance, postural control and knowledge of precautions.  Patient progressing toward long term goals..  Continue plan of care.  PT Short Term Goals Week 1:  PT Short Term Goal 1 (Week 1): Pt will perform bed <> chair transfers with supervision PT Short Term Goal 1 - Progress (Week 1): Met PT Short Term Goal 2 (Week 1): Pt will perform static standing with max A for functional task PT Short Term Goal 2 - Progress (Week 1): Met PT Short Term Goal 3 (Week 1): Pt will gait 10' in controlled environment with max A PT Short Term Goal 3 - Progress (Week  1): Partly met (using Sara Plus with total A to advance LES) Week 2:   = LTGS  Skilled Therapeutic Interventions/Progress Updates:  Ambulation/gait training;Balance/vestibular training;Discharge planning;Community reintegration;Disease Lexicographer;Functional electrical stimulation;Functional mobility training;Psychosocial support;Patient/family education;Pain management;Neuromuscular re-education;Skin care/wound management;Splinting/orthotics;Therapeutic Activities;UE/LE Coordination activities;UE/LE Strength taining/ROM;Therapeutic Exercise;Wheelchair propulsion/positioning   Therapy Documentation Precautions:  Precautions Precautions: Posterior Hip Restrictions Weight Bearing Restrictions: Yes LLE Weight Bearing: Weight bearing as tolerated   Locomotion : Ambulation Ambulation/Gait Assistance: 1: +2 Total assist   See FIM for current functional status   [name] [facility] 08/22/2011, 2:04 PM

## 2011-08-22 NOTE — Progress Notes (Signed)
Occupational Therapy Weekly Progress Note & Session Notes  Patient Details  Name: Andrew Sullivan MRN: [id number] Date of Birth: [birth year]  Today's Date: 08/22/2011  WEEKLY PROGRESS NOTE Patient has met 3 of 3 short term goals.  Patient is making great progress during his CIR stay. Patient very motivated to be as independent as possible. Currently, patient performing at a w/c level secondary to increased tone & spasticity, and decreased strength throughout bilateral LEs. Overall patient at a modified independent level for ADLs and ADL transfers from a w/c level. Patient very motivated and determined to reach ambulation in the future and function like he was PTA. Discharge plan has changed from home -> SNF per patient's request.   Patient continues to demonstrate the following deficits: decreased strength throughout bilateral LEs, decreased independence with transfers (standing transfers), decreased overall activity tolerance/endurance, decreased independence with ADLs (all ADLs performed at w/c & seated level secondary to decreased strength in BLE to stand). Therefore, patient will continue to benefit from skilled OT intervention to enhance overall performance with BADL and iADL.  Patient progressing toward long term goals..  Continue plan of care. Goals recently upgraded to overall modified independent level for w/c & seated ADLs. Patient's long term goals, for down the road, are standing for ADLs and ambulating using DME and AE prn.   OT Short Term Goals Week 1:  OT Short Term Goal 1 (Week 1): pt. will transfer to toilet with supervision OT Short Term Goal 1 - Progress (Week 1): Met OT Short Term Goal 2 (Week 1): Pt. will transfer to tub bench with supervision OT Short Term Goal 2 - Progress (Week 1): Met OT Short Term Goal 3 (Week 1): Pt. will use AE with set up OT Short Term Goal 3 - Progress (Week 1): Met  Week 2 STG's = LTG's  Skilled Therapeutic Interventions/Progress Updates:    Balance/vestibular training;Community reintegration;Discharge planning;Disease mangement/prevention;DME/adaptive equipment instruction;Functional mobility training;Neuromuscular re-education;Pain management;Patient/family education;Psychosocial support;Self Care/advanced ADL retraining;Skin care/wound managment;Splinting/orthotics;Therapeutic Activities;UE/LE Strength taining/ROM;UE/LE Coordination activities;Visual/perceptual remediation/compensation;Wheelchair propulsion/positioning;Therapeutic Exercise   Precautions:  Precautions Precautions: Posterior Hip Restrictions Weight Bearing Restrictions: Yes LLE Weight Bearing: Weight bearing as tolerated  See FIM for current functional status  SESSION NOTES  Session #1 0930-1030 - 60 Minutes Individual Therapy No complaints of pain Patient performed ADL retraining at shower level at overall mod I level for bathing & dressing with more than reasonable amount of time. Therapist recommenced patient perform ADL using tub/shower in ADL apartment, however patient refused. Patient Mod I in room.   Session #2 1330-1400 - 30 Minutes Individual Therapy No complaints of pain Focused skilled intervention on donning doffing bilateral shoes. Introduced, demonstrated, and had patient return demonstrate use of long handled shoe horn. With persistence and patience using shoe horn patient able to donn bilateral shoes with set-up assistance (set up with shoe and set-up with placement of shoe horn). Therapist modified shoes to help increase independence with donning. Also gave patient dysom to help increase independence.   Kainon Varady 08/22/2011, 10:15 AM

## 2011-08-22 NOTE — Progress Notes (Signed)
Physical Therapy Session Note  Patient Details  Name: Andrew Sullivan MRN: [id number] Date of Birth: 26-Sep-1970  Today's Date: 08/22/2011  Short Term Goals: Week 1:  PT Short Term Goal 1 (Week 1): Pt will perform bed <> chair transfers with supervision PT Short Term Goal 2 (Week 1): Pt will perform static standing with max A for functional task PT Short Term Goal 3 (Week 1): Pt will gait 10' in controlled environment with max A  Therapy Documentation Precautions:  Precautions Precautions: Posterior Hip Restrictions Weight Bearing Restrictions: Yes LLE Weight Bearing: Weight bearing as tolerated  See FIM for current functional status  Skilled Therapeutic Interventions/Progress Updates:  Time: 1030-1130 Time Calculation (min): 60 min Individual therapy session.  No complaints of pain with therapy. Tx focused on furniture transfer (couch); standing, weight shifts and gait in [place] plus c walking sling, and w/c mobility in a community setting.  Pt able to complete couch transfer (uphill to w/c) with supervision. Demonstrated improved ability to weight shift in [name] plus today, unweighting RLE so that therapist could advance with gait.  Pt continues to need total A with gait as he continues to lack quadricep activation for gait x 6'.  Social worker discussed d/c to SNF with patient.  Pt plans to d/c to [facility] skilled facility on Monday following PT.  Remainder of session focused on w/c mobility in community with ascending/descending ramp, managing obstacles, and maneuvering on uneven terrain. Educated pt on accessibility, in/out of elevator, and energy conservation. Made pt Mod I in room.  Skilled Therapeutic Interventions/Progress Updates:  Time: 4098-1191 Time Calculation (min): 60 min  Individual therapy. Pt reports increased pain in L hip but refuses pain medication when offered to alert RN.  Pt demonstrates Mod I with w/c mobility, transfers, and bed mobility.  Tx focused on PROM of  hip, knee and ankle on powder board.  [name] from [employer] was present for custom w/c evaluation.  Discussed pt's history, impairments, and presentation with [name] in order to ensure optimal custom w/c selection.  Recommended rigid lightweight Quickie 2 w/c with Jay-contoured back, and removal elevated leg rests recommended to increase independence with mobility in home, independence in community, provide postural support needed to facilitate function and safety to improve posture, and enhance physiological function while supporting LLE post op.  Pt requested that heat be applied to L hip (x20 min).  Pt reports decreased pain in L following application of hot packs.  [name] 08/22/2011, 12:57 PM

## 2011-08-22 NOTE — Progress Notes (Signed)
Subjective/Complaints: No new complaints. Spasticity still an issue. Other ROS items reviewed and otherwise negative.  Objective: Vital Signs: Blood pressure 107/66, pulse 73, temperature 98.6 F (37 C), temperature source Oral, resp. rate 20, weight 78.2 kg (172 lb 6.4 oz), SpO2 97.00%. No results found. No results found for this or any previous visit (from the past 72 hour(s)).   HEENT: normal Cardio: RRR Resp: CTA B/L GI: BS positive Extremity:  No Edema Skin:   Other surgical incision L hip Neuro: Other see below Musc/Skel:  Swelling L thigh motor strength is 4/5 in the right deltoid, biceps, triceps,, grip 4 minus in the wrist extensor on the left side he is 4+ in the deltoid, biceps, triceps, grip, wrist extensor  Lower extremity strength is 2 minus bilateral quads with support 0 at the ankle dorsiflexors and plantar flexors  Sensation is intact in the upper and lower extremity  Patient has clonus at bilateral ankles.  Hyperactive reflex in the right patellar. Tone 1+ to 2 in left lower extremity   Assessment/Plan: 1. Functional deficits secondary to L femoral neck fracture which require 3+ hours per day of interdisciplinary therapy in a comprehensive inpatient rehab setting. Physiatrist is providing close team supervision and 24 hour management of active medical problems listed below. Physiatrist and rehab team continue to assess barriers to discharge/monitor patient progress toward functional and medical goals.  Patient is concerned ultimately about safety at home. Looking at SNF now. FIM: FIM - Bathing Bathing Steps Patient Completed: Chest;Right Arm;Left Arm;Abdomen;Front perineal area;Buttocks;Right upper leg;Left upper leg;Right lower leg (including foot);Left lower leg (including foot) Bathing: 6: Assistive device (Comment)  FIM - Upper Body Dressing/Undressing Upper body dressing/undressing steps patient completed: Thread/unthread right sleeve of pullover  shirt/dresss;Thread/unthread left sleeve of pullover shirt/dress;Put head through opening of pull over shirt/dress;Pull shirt over trunk Upper body dressing/undressing: 7: Complete Independence: No helper FIM - Lower Body Dressing/Undressing Lower body dressing/undressing steps patient completed: Thread/unthread right underwear leg;Thread/unthread left underwear leg;Pull underwear up/down;Thread/unthread right pants leg;Thread/unthread left pants leg;Pull pants up/down;Don/Doff right sock;Don/Doff left sock Lower body dressing/undressing: 5: Supervision: Safety issues/verbal cues  FIM - Toileting Toileting steps completed by patient: Adjust clothing prior to toileting Toileting Assistive Devices: Grab bar or rail for support Toileting: 4: Steadying assist  FIM - Diplomatic Services operational officer Devices: Grab bars Toilet Transfers: 4-To toilet/BSC: Min A (steadying Pt. > 75%);4-From toilet/BSC: Min A (steadying Pt. > 75%)  FIM - Bed/Chair Transfer Bed/Chair Transfer Assistive Devices: Arm rests Bed/Chair Transfer: 5: Supine > Sit: Supervision (verbal cues/safety issues);5: Sit > Supine: Supervision (verbal cues/safety issues);5: Bed > Chair or W/C: Supervision (verbal cues/safety issues);5: Chair or W/C > Bed: Supervision (verbal cues/safety issues)  FIM - Locomotion: Wheelchair Distance: > 150 Locomotion: Wheelchair: 6: Travels 150 ft or more, turns around, maneuvers to table, bed or toilet, negotiates 3% grade: maneuvers on rugs and over door sills independently FIM - Locomotion: Ambulation Locomotion: Ambulation Assistive Devices: Sara Plus (with walking sling) Ambulation/Gait Assistance: 1: +2 Total assist Locomotion: Ambulation: 1: Two helpers (total A for LE advancement)  Comprehension Comprehension Mode: Auditory Comprehension: 7-Follows complex conversation/direction: With no assist  Expression Expression Mode: Verbal Expression: 7-Expresses complex ideas: With no  assist  Social Interaction Social Interaction: 7-Interacts appropriately with others - No medications needed.  Problem Solving Problem Solving: 7-Solves complex problems: Recognizes & self-corrects  Memory Memory: 7-Complete Independence: No helper   Medical Problem List and Plan:  1. DVT Prophylaxis/Anticoagulation: Pharmaceutical: Lovenox  2. Pain Management:  reasonable. D/c'ed mobic         3. Mood: motivated to get better.  4. FUO: ?atelectasis== Resolved  6. Spasticity: baclofen- would benefit from an increase, but he's fairly set in his ways and how he wants things to be. I asked the patient to let me know if he would like to make changes. I'm not going to try to "sell" him on it any further. 7. Constipation: resolved. Will schedule senna on prn basis 8.  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

## 2011-08-22 NOTE — Discharge Summary (Signed)
Andrew Sullivan, HERNAN NO.:  [id number]  MEDICAL RECORD NO.:  [id number]  LOCATION:  4006                         FACILITY:  [facility]  PHYSICIAN:  Ranelle Oyster, M.D.DATE OF BIRTH:  07-Oct-1970  DATE OF ADMISSION:  08/15/2011 DATE OF DISCHARGE:                              DISCHARGE SUMMARY   Discharge date August 25, 2011, to skilled nursing facility.  DISCHARGE DIAGNOSES: 1. Left femoral neck fracture, status post left hip hemiarthroplasty. 2. Subcutaneous Lovenox for deep venous thrombosis prophylaxis. 3. History of spinal cord injury with spastic quadriparesis. 4. Constipation, resolved. This is a 41 year old male with history of cervical spine injury with myelopathy and paraparesis who tripped and fell 4 days prior to admission August 11, 2011, with acute onset of left hip pain and inability to walk.  X-rays done showed left impacted mid cervical femur fracture, minimally comminuted.  Evaluated by Dr. Charlann Boxer, Orthopedic Services, underwent left hip hemiarthroplasty August 12, 2011.  Postoperative weightbearing as tolerated.  Subcutaneous Lovenox for deep vein thrombosis prophylaxis.  Postoperative worsening of spasticity, probable constipation.  He remained on baclofen.  He was admitted for comprehensive rehab program.  PAST MEDICAL HISTORY:  See discharge diagnosis.  ALLERGIES:  Penicillin.  SOCIAL HISTORY:  Lives with family, 1-level home.  FUNCTIONAL HISTORY:  Prior to admission was driving.  He did not use stairs.  FUNCTIONAL STATUS:  Upon admission to rehab services was +2 total assist sit to stand, +2 total assist stand, pivot transfers.  PHYSICAL EXAMINATION:  VITAL SIGNS:  Blood pressure 107/66, pulse 73, respirations 20, temperature 98.6.  This was an alert male, in no acute distress.  LUNGS:  Clear to auscultation.  CARDIAC:  Regular rate and rhythm.  ABDOMEN:  Soft, nontender.  Good bowel sounds.  Noted increased tone throughout both lower  extremities, grossly 2-3.  Strength 2/5 lower extremities.  REHABILITATION AND HOSPITAL COURSE:  The patient was admitted to inpatient rehab services with therapies initiated on a 3-hour daily basis consisting of physical therapy, occupational therapy, and rehabilitation nursing.  The following issues were addressed during the patient's rehabilitation stay.  Pertaining to Mr. Douthat left femoral neck fracture, he had undergone left hip hemiarthroplasty per Dr. Charlann Boxer, neurovascular intact.  He was weightbearing as tolerated.  He remained on subcutaneous Lovenox for DVT prophylaxis.  He was using hydrocodone for pain management with good results.  His baclofen was adjusted for history of spasticity related to spinal cord injury 10 mg 4 times daily. He did have some bouts of constipation with bowel program regulated.  He was treated with Bactrim for Klebsiella urinary tract infection, remaining afebrile.  The patient received weekly collaborative interdisciplinary team conferences to discuss estimated length of stay, family teaching, and any barriers to discharge.  He was continent of bowel and bladder.  Overall supervision steady assist for activities of daily living, supervision wheelchair level, moderate assist sit to supine, max assist for sit to stand.  Due to his slow but progressive limited advances, it was the patient's request to be discharged to skilled nursing facility with bed becoming available August 25, 2011, and discharge taking place at that time.  DISCHARGE MEDICATIONS:  Tylenol 650 mg p.o. every 4  hours as needed pain or fever, baclofen 10 mg p.o. 4 times per day, Dulcolax suppository 10 mg daily as needed, Colace 100 mg twice daily, Norco 1-2 tablets every 4 hours as needed pain, Robaxin 500 mg every 6 hours as needed spasms, Bactrim 1 tablet every 12 hours x2 more days and stop.  DIET:  Regular.  SPECIAL INSTRUCTIONS:  Weightbearing as tolerated.  The patient  would follow up Dr. Charlann Boxer, Orthopedic Service, call for appointment, Dr. Faith Rogue, at the outpatient rehab service office as needed.     Mariam Dollar, P.A.   ______________________________ Ranelle Oyster, M.D.    DA/MEDQ  D:  08/22/2011  T:  08/22/2011  Job:  [id number]  cc:   Everrett Coombe, MD Madlyn Frankel. Charlann Boxer, M.D.

## 2011-08-22 NOTE — Progress Notes (Signed)
Social Work Patient ID: Andrew Sullivan, male   DOB: 09/15/1970, 41 y.o.   MRN: [id number]  Have received SNF bed offer on pt from [facility] who could admit pt on Monday 6/17.  Have discussed with patient who accepts this bed offer.  Plan for transfer 6/17 via ambulance.  Treatment team aware.  Zeev Deakins

## 2011-08-22 NOTE — Progress Notes (Signed)
Reviewed and in agreement with treatment provided.  

## 2011-08-23 NOTE — Progress Notes (Signed)
Patient ID: Andrew Sullivan, male   DOB: 02-04-1971, 41 y.o.   MRN: [id number] Subjective/Complaints: No new complaints. Spasticity unchanged. Mod i in the room Other ROS items reviewed and otherwise negative.  Objective: Vital Signs: Blood pressure 122/71, pulse 65, temperature 97.6 F (36.4 C), temperature source Oral, resp. rate 17, weight 78.2 kg (172 lb 6.4 oz), SpO2 94.00%. No results found. No results found for this or any previous visit (from the past 72 hour(s)).   HEENT: normal Cardio: RRR Resp: CTA B/L GI: BS positive Extremity:  No Edema Skin:   Other surgical incision L hip Neuro: Other see below Musc/Skel:  Swelling L thigh motor strength is 4/5 in the right deltoid, biceps, triceps,, grip 4 minus in the wrist extensor on the left side he is 4+ in the deltoid, biceps, triceps, grip, wrist extensor  Lower extremity strength is 2 minus bilateral quads with support 0 at the ankle dorsiflexors and plantar flexors  Sensation is intact in the upper and lower extremity  Patient has clonus at bilateral ankles.  Hyperactive reflex in the bilateral patellars. Tone 1+ to 2 in left lower extremity   Assessment/Plan: 1. Functional deficits secondary to L femoral neck fracture which require 3+ hours per day of interdisciplinary therapy in a comprehensive inpatient rehab setting. Physiatrist is providing close team supervision and 24 hour management of active medical problems listed below. Physiatrist and rehab team continue to assess barriers to discharge/monitor patient progress toward functional and medical goals.  Patient is concerned ultimately about safety at home. Looking at SNF now. FIM: FIM - Bathing Bathing Steps Patient Completed: Chest;Right Arm;Left Arm;Abdomen;Front perineal area;Buttocks;Right upper leg;Left upper leg;Right lower leg (including foot);Left lower leg (including foot) Bathing: 6: Assistive device (Comment) (seated in shower and long handled  sponge)  FIM - Upper Body Dressing/Undressing Upper body dressing/undressing steps patient completed: Thread/unthread right sleeve of pullover shirt/dresss;Thread/unthread left sleeve of pullover shirt/dress;Put head through opening of pull over shirt/dress;Pull shirt over trunk Upper body dressing/undressing: 7: Complete Independence: No helper FIM - Lower Body Dressing/Undressing Lower body dressing/undressing steps patient completed: Thread/unthread right underwear leg;Thread/unthread left underwear leg;Pull underwear up/down;Thread/unthread right pants leg;Thread/unthread left pants leg;Pull pants up/down;Don/Doff right sock;Don/Doff left sock Lower body dressing/undressing: 5: Supervision: Safety issues/verbal cues  FIM - Toileting Toileting steps completed by patient: Adjust clothing prior to toileting Toileting Assistive Devices: Grab bar or rail for support Toileting: 4: Steadying assist  FIM - Diplomatic Services operational officer Devices: Grab bars Toilet Transfers: 4-To toilet/BSC: Min A (steadying Pt. > 75%);4-From toilet/BSC: Min A (steadying Pt. > 75%)  FIM - Bed/Chair Transfer Bed/Chair Transfer Assistive Devices: Arm rests Bed/Chair Transfer: 6: Supine > Sit: No assist;6: Sit > Supine: No assist;6: Bed > Chair or W/C: No assist;6: Chair or W/C > Bed: No assist  FIM - Locomotion: Wheelchair Distance: > 150 Locomotion: Wheelchair: 6: Travels 150 ft or more, turns around, maneuvers to table, bed or toilet, negotiates 3% grade: maneuvers on rugs and over door sills independently FIM - Locomotion: Ambulation Locomotion: Ambulation Assistive Devices: Sara Plus Ambulation/Gait Assistance: 1: +2 Total assist Locomotion: Ambulation: 1: Two helpers (total A for LE advancement)  Comprehension Comprehension Mode: Auditory Comprehension: 7-Follows complex conversation/direction: With no assist  Expression Expression Mode: Verbal Expression: 7-Expresses complex ideas: With  no assist  Social Interaction Social Interaction: 7-Interacts appropriately with others - No medications needed.  Problem Solving Problem Solving: 7-Solves complex problems: Recognizes & self-corrects  Memory Memory: 7-Complete Independence: No helper  Medical Problem List and Plan:  1. DVT Prophylaxis/Anticoagulation: Pharmaceutical: Lovenox  2. Pain Management: reasonable. D/c'ed mobic         3. Mood: motivated to get better.  4. FUO: ?atelectasis== Resolved  6. Spasticity: baclofen- would benefit from an increase, but he's fairly set in his ways and how he wants things to be. I asked the patient to let me know if he would like to make changes. I'm not going to try to "sell" him on it any further. 7. Constipation: resolved. Will schedule senna on prn basis 8.  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

## 2011-08-23 NOTE — Progress Notes (Signed)
Physical Therapy Session Note  Patient Details  Name: Andrew Sullivan MRN: [id number] Date of Birth: [birth year]  Today's Date: 08/23/2011 Time: 4098-1191 Time Calculation (min): 40 min  Skilled Therapeutic Interventions/Progress Updates:  Pt up in chair and ready for therapy with no complaints of pain.  Tx focused on therex for UE and LE strengthening and standing tolerance for LE strength and spasticity management. Pt completed in UE bike for UE strengthening, backwards and forwards.  Pt stood in standing frame 3x62min playing cards with seated rest breaks between. Pt able to manage LEs in frame and adjust hips for optimal posture. Time duration limited due to hip discomfort.  Pt self-propelled WC to/from room in busy environment with no difficulty and manage parts as well.      Therapy Documentation Precautions:  Precautions Precautions: Posterior Hip Restrictions Weight Bearing Restrictions: Yes LLE Weight Bearing: Weight bearing as tolerated    Pain: Pain Assessment Pain Assessment: No/denies pain   Locomotion : Wheelchair Mobility Distance: 150  Pt continues to demonstrate safe and effective WC and parts management.     See FIM for current functional status  Therapy/Group: Individual Therapy  Virl Cagey 08/23/2011, 9:49 AM

## 2011-08-24 NOTE — Progress Notes (Signed)
Patient ID: Andrew Sullivan, male   DOB: 11-May-1970, 41 y.o.   MRN: [id number] Patient ID: Andrew Sullivan, male   DOB: [birth year], 41 y.o.   MRN: [id number] Subjective/Complaints: No new complaints. Mod i in the room currently.  Other ROS items reviewed and otherwise negative.  Objective: Vital Signs: Blood pressure 120/55, pulse 68, temperature 98.1 F (36.7 C), temperature source Oral, resp. rate 16, weight 78.2 kg (172 lb 6.4 oz), SpO2 98.00%. No results found. No results found for this or any previous visit (from the past 72 hour(s)).   HEENT: normal Cardio: RRR Resp: CTA B/L GI: BS positive Extremity:  No Edema Skin:   Other surgical incision L hip Neuro: Other see below Musc/Skel:  Swelling L thigh motor strength is 4/5 in the right deltoid, biceps, triceps,, grip 4 minus in the wrist extensor on the left side he is 4+ in the deltoid, biceps, triceps, grip, wrist extensor  Lower extremity strength is 2 minus bilateral quads with support 0 at the ankle dorsiflexors and plantar flexors  Sensation is intact in the upper and lower extremity  Patient has clonus at bilateral ankles.  Hyperactive reflex in the bilateral patellars. Tone 1+ to 2 in left lower extremity tr to 1+ on the RLE   Assessment/Plan: 1. Functional deficits secondary to L femoral neck fracture which require 3+ hours per day of interdisciplinary therapy in a comprehensive inpatient rehab setting. Physiatrist is providing close team supervision and 24 hour management of active medical problems listed below. Physiatrist and rehab team continue to assess barriers to discharge/monitor patient progress toward functional and medical goals.  SNF pending FIM: FIM - Bathing Bathing Steps Patient Completed: Chest;Right Arm;Left Arm;Abdomen;Front perineal area;Buttocks;Right upper leg;Left upper leg;Right lower leg (including foot);Left lower leg (including foot) Bathing:  (Patient independent in room with  wheelchair)  FIM - Upper Body Dressing/Undressing Upper body dressing/undressing steps patient completed:  (Patient independent in room with wheelchair) Upper body dressing/undressing: 7: Complete Independence: No helper FIM - Lower Body Dressing/Undressing Lower body dressing/undressing steps patient completed: Thread/unthread right underwear leg;Thread/unthread left underwear leg;Pull underwear up/down;Thread/unthread right pants leg;Thread/unthread left pants leg;Pull pants up/down;Don/Doff right sock;Don/Doff left sock Lower body dressing/undressing: 5: Supervision: Safety issues/verbal cues  FIM - Toileting Toileting steps completed by patient: Adjust clothing prior to toileting Toileting Assistive Devices: Grab bar or rail for support Toileting: 4: Steadying assist  FIM - Diplomatic Services operational officer Devices: Grab bars Toilet Transfers: 6-Assistive device: No helper  FIM - Banker Devices: Bed rails Bed/Chair Transfer: 6: Assistive device: no helper  FIM - Locomotion: Wheelchair Distance: 150 Locomotion: Wheelchair: 6: Travels 150 ft or more, turns around, maneuvers to table, bed or toilet, negotiates 3% grade: maneuvers on rugs and over door sills independently FIM - Locomotion: Ambulation Locomotion: Ambulation Assistive Devices: Sara Plus Ambulation/Gait Assistance: 1: +2 Total assist Locomotion: Ambulation: 1: Two helpers (total A for LE advancement)  Comprehension Comprehension Mode: Auditory Comprehension: 7-Follows complex conversation/direction: With no assist  Expression Expression Mode: Verbal Expression: 7-Expresses complex ideas: With no assist  Social Interaction Social Interaction: 7-Interacts appropriately with others - No medications needed.  Problem Solving Problem Solving: 7-Solves complex problems: Recognizes & self-corrects  Memory Memory: 7-Complete Independence: No helper   Medical  Problem List and Plan:  1. DVT Prophylaxis/Anticoagulation: Pharmaceutical: Lovenox  2. Pain Management: reasonable. D/c'ed mobic         3. Mood: motivated to get better.  4. FUO: ?atelectasis== Resolved  6. Spasticity: baclofen- would benefit from an increase, but he's fairly set in his ways and how he wants things to be. I asked the patient to let me know if he would like to make changes. I'm not going to try to "sell" him on it any further. 7. Constipation: resolved. Will schedule senna on prn basis 8.  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

## 2011-08-24 NOTE — Progress Notes (Signed)
Occupational Therapy Note  Patient Details  Name: Andrew Sullivan MRN: [id number] Date of Birth: [birth year] Today's Date: 08/24/2011 Time:  1125-1210:  (45 min) Pain:  None Individual Therapy  Engaged in doing laundry at wc level.  Pt propelled self to put clothes in machine.  Educated pt on transferring from wc to tub bench in ADL apt.  Pt. Stated the tub bench was different than one he had used before.  He was  unale to his leg in the tub without breaking hip precautions.      Humberto Seals 08/24/2011, 6:20 PM

## 2011-08-25 ENCOUNTER — Inpatient Hospital Stay (HOSPITAL_COMMUNITY): Payer: Medicare Other

## 2011-08-25 NOTE — Progress Notes (Signed)
Social Work Discharge Note Discharge Note  The overall goal for the admission was met for:   Discharge location: Yes-[facility]  Length of Stay: Yes-10 DAYS  Discharge activity level: Yes-MIN LEVEL  Home/community participation: Yes  Services provided included: MD, RD, PT, OT, RN, CM, TR, Pharmacy and SW  Financial Services: Medicare and Medicaid  Follow-up services arranged: Other: SHORT TERM NHP  Comments (or additional information):NEEDS MORE THERAPY BEFORE GOING HOME  Patient/Family verbalized understanding of follow-up arrangements: Yes  Individual responsible for coordination of the follow-up plan: SELF  Confirmed correct DME delivered: Lucy Chris 08/25/2011    Lucy Chris

## 2011-08-25 NOTE — Progress Notes (Signed)
Reviewed and in agreement with treatment provided.  

## 2011-08-25 NOTE — Progress Notes (Signed)
Subjective/Complaints: No new complaints. Mod i in the room currently. In wheelchair already this am Other ROS items reviewed and otherwise negative.  Objective: Vital Signs: Blood pressure 114/73, pulse 67, temperature 98.3 F (36.8 C), temperature source Oral, resp. rate 16, weight 78.2 kg (172 lb 6.4 oz), SpO2 97.00%. No results found. No results found for this or any previous visit (from the past 72 hour(s)).   HEENT: normal Cardio: RRR Resp: CTA B/L GI: BS positive Extremity:  No Edema Skin:   Other surgical incision L hip Neuro: Other see below Musc/Skel:  Swelling L thigh motor strength is 4/5 in the right deltoid, biceps, triceps,, grip 4 minus in the wrist extensor on the left side he is 4+ in the deltoid, biceps, triceps, grip, wrist extensor  Lower extremity strength is 2 minus bilateral quads with support 0 at the ankle dorsiflexors and plantar flexors  Sensation is intact in the upper and lower extremity  Patient has clonus at bilateral ankles.  Hyperactive reflex in the bilateral patellars. Tone 1+ to 2 in left lower extremity tr to 1+ on the RLE Wound well healed   Assessment/Plan: 1. Functional deficits secondary to L femoral neck fracture which require 3+ hours per day of interdisciplinary therapy in a comprehensive inpatient rehab setting. Physiatrist is providing close team supervision and 24 hour management of active medical problems listed below. Physiatrist and rehab team continue to assess barriers to discharge/monitor patient progress toward functional and medical goals.  SNF pending FIM: FIM - Bathing Bathing Steps Patient Completed: Chest;Right Arm;Left lower leg (including foot);Left Arm;Abdomen;Front perineal area;Buttocks;Right upper leg;Left upper leg;Right lower leg (including foot) Bathing: 7: Complete Independence: No helper  FIM - Upper Body Dressing/Undressing Upper body dressing/undressing steps patient completed: Thread/unthread right  sleeve of pullover shirt/dresss;Thread/unthread left sleeve of pullover shirt/dress;Put head through opening of pull over shirt/dress;Pull shirt over trunk Upper body dressing/undressing: 7: Complete Independence: No helper FIM - Lower Body Dressing/Undressing Lower body dressing/undressing steps patient completed: Thread/unthread right pants leg;Thread/unthread left pants leg;Pull pants up/down Lower body dressing/undressing: 7: Complete Independence: No helper  FIM - Toileting Toileting steps completed by patient: Adjust clothing prior to toileting;Performs perineal hygiene;Adjust clothing after toileting Toileting Assistive Devices: Grab bar or rail for support Toileting: 6: Assistive device: No helper  FIM - Diplomatic Services operational officer Devices: Grab bars Toilet Transfers: 6-Assistive device: No helper  FIM - Banker Devices: Bed rails Bed/Chair Transfer: 6: Assistive device: no helper  FIM - Locomotion: Wheelchair Distance: 150 Locomotion: Wheelchair: 6: Travels 150 ft or more, turns around, maneuvers to table, bed or toilet, negotiates 3% grade: maneuvers on rugs and over door sills independently FIM - Locomotion: Ambulation Locomotion: Ambulation Assistive Devices: Sara Plus Ambulation/Gait Assistance: 1: +2 Total assist Locomotion: Ambulation: 1: Two helpers (total A for LE advancement)  Comprehension Comprehension Mode: Auditory Comprehension: 7-Follows complex conversation/direction: With no assist  Expression Expression Mode: Verbal Expression: 7-Expresses complex ideas: With no assist  Social Interaction Social Interaction: 7-Interacts appropriately with others - No medications needed.  Problem Solving Problem Solving: 7-Solves complex problems: Recognizes & self-corrects  Memory Memory: 7-Complete Independence: No helper   Medical Problem List and Plan:  1. DVT Prophylaxis/Anticoagulation:  Pharmaceutical: Lovenox  2. Pain Management: reasonable. D/c'ed mobic per patient request        3. Mood: motivated to get better.  4. FUO: ?atelectasis== Resolved  6. Spasticity: baclofen-won't increase per patient wishes. 7. Constipation: resolved. Will schedule senna on prn basis  8.  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

## 2011-08-25 NOTE — Plan of Care (Deleted)
Problem: RH Ambulation Goal: LTG Patient will ambulate in controlled environment (PT) LTG: Patient will ambulate in a controlled environment, # of feet with assistance (PT).  Outcome: Not Met (add Reason) Pt amb total A with lift equipment for only 8'

## 2011-08-25 NOTE — Progress Notes (Signed)
Pt. Discharged to SNF via [facility]) @ 1400, VSS, AOX3.   Report called to Nurse Supervisor, Dois Davenport.  No further questions asked.

## 2011-08-25 NOTE — Progress Notes (Signed)
Physical Therapy Discharge Summary  Patient Details  Name: Andrew Sullivan MRN: [id number] Date of Birth: September 21, 1970  Today's Date: 08/25/2011 Time: 0830-0900 Time Calculation (min): 30 min  Patient has met 6 of 6 long term goals due to improved activity tolerance, improved balance, improved postural control, ability to compensate for deficits, functional use of  right lower extremity and left lower extremity, improved awareness and improved coordination.  Patient to discharge at a wheelchair level Modified Independent.   Patient feels as though he will benefit from more time and healing of L hip prior to returning home.  Pt chose to continue therapy in skilled facility.   Recommendation:  Patient will benefit from ongoing skilled PT services in skilled nursing facility setting to continue to advance safe functional mobility, address ongoing impairments in decreased flexibility, spasticity management, pain management, weight bearing through LEs, activity tolerance, endurance, ambulation, strengthening and minimizing fall risk.  Equipment: Pt was measured and assessed for custom w/c.  Andrew Sullivan from [employer] will be following up with pt in ordering custom w/c.  No other equipment provided.  Reasons for discharge: discharge from hospital  Patient/family agrees with progress made and goals achieved: Yes  PT Discharge Precautions/Restrictions Precautions Precautions: Posterior Hip Precaution Comments: pt recalls 3/3 precautions and obeys without cues Restrictions Weight Bearing Restrictions: Yes LLE Weight Bearing: Weight bearing as tolerated Vision/Perception  Perception Perception: Within Functional Limits Praxis Praxis: Intact  Cognition Overall Cognitive Status: Appears within functional limits for tasks assessed Arousal/Alertness: Awake/alert Orientation Level: Oriented X4 Memory: Appears intact Awareness: Appears intact Problem Solving: Appears intact Safety/Judgment:  Appears intact Sensation Sensation Light Touch: Appears Intact Coordination Gross Motor Movements are Fluid and Coordinated: Yes (fluid bilateral UE movements) Fine Motor Movements are Fluid and Coordinated: Yes Motor  Motor Motor: Abnormal tone (of bilateral LEs) Motor - Skilled Clinical Observations: increased tone B LEs  Locomotion  Ambulation Ambulation/Gait Assistance: 1: +2 Total assist Wheelchair Mobility Wheelchair Mobility: Yes Wheelchair Assistance: 6: Modified independent (Device/Increase time) Occupational hygienist: Both upper extremities Wheelchair Parts Management: Independent Distance: >150  Trunk/Postural Assessment  Cervical Assessment Cervical Assessment: Within Functional Limits Thoracic Assessment Thoracic Assessment:  (slight curvature) Lumbar Assessment Lumbar Assessment:  (increased prominance of lower lumber vertebrae; slight curve) Postural Control Postural Control:  (generalized trunk weakness)  Balance Static Sitting Balance Static Sitting - Level of Assistance: 6: Modified independent (Device/Increase time) Dynamic Sitting Balance Dynamic Sitting - Level of Assistance: 6: Modified independent (Device/Increase time) Static Standing Balance Static Standing - Level of Assistance: 1: +2 Total assist (with sara lift and harness) Extremity Assessment  RUE Assessment RUE Assessment: Within Functional Limits LUE Assessment LUE Assessment: Within Functional Limits RLE Assessment RLE Assessment: Exceptions to [facility] (increase tone, clonus, and spasticity) RLE Strength RLE Overall Strength: Deficits (3/5 hip flex, hip ext, and knee flex. 2/5 PF and DF) RLE Tone RLE Tone Comments: increased tone making stretching into abduction, full knee flexion/extension difficult LLE Assessment LLE Assessment: Exceptions to [facility] LLE Strength LLE Overall Strength: Deficits (3/5 knee flex; trace hip flexion and ankle movement) LLE Tone LLE Tone: Mild;Moderate (makes  abduction and knee ext/flexion difficult)  See FIM for current functional status  Andrew Sullivan 08/25/2011, 12:45 PM  Skilled therapy session: Time: 0830-0900 (30 min)  Individual therapy session.  Pt c/o 8/10 pain in L hip but refuses pain medication.  Pt demonstrates ability to complete all bed mobility and transfers to and from w/c with mod I.  Pt is able to obey 3/3 back precautions  and w/c management safely with no cueing from therapist.  Pt completed sit to stand with RW from elevated mat with two helpers (Max A of one to assist with transfer and 1 helper to block LEs and maintain weight bearing on bilateral feet).  Pt fatigued quickly, standing in RW with max A for only 10 seconds.  Gait in [place] with walking sling for 8' (with one rest break).  Pt requires total A to advance bilateral LEs, cueing for weight shifts.  Pt is able to unweight RLE, supporting majority of body weight on bilateral UEs.  Educated pt on energy conservation, home modification and safety, and benefits of WB on bilateral LEs.  Pt reports that all questions have been answered prior to d/c and he has no additional concerns.

## 2011-08-25 NOTE — Progress Notes (Signed)
Occupational Therapy Discharge Summary  Patient Details  Name: Andrew Sullivan MRN: [id number] Date of Birth: 10-09-1970  Today's Date: 08/25/2011  Patient has met 8 of 9 long term goals due to improved activity tolerance, improved balance, ability to compensate for deficits and improved coordination.  Patient to discharge at overall Modified Independent level except supervision for tub/shower combination transfers using tub transfer bench. No family education completed at this time secondary to patients overall mod I status at discharge and patient is discharging to a SNF for further therapies; patient's goals are to be performing ADLs/IADLs in standing like PTA.  Reasons goals not met: Patient did not meet tub/shower transfer goal of modified independent secondary to during transfer patient breaking posterior hip precautions and needed supervision for transfer. Patient able to transfer in/out of a roll in shower at modified independent level by performing a squat pivot/scoot transfer onto a shower chair.   Recommendation:  Patient will benefit from ongoing skilled OT services in skilled nursing facility setting to continue to advance functional skills in the area of BADL and iADL for standing activities. Patient's goals are to be able to perform ADLs & IADLs in standing position.   Equipment: No equipment provided. Defer to next venue; patient d/c -> SNF  Reasons for discharge: treatment goals met and discharge from hospital  Patient/family agrees with progress made and goals achieved: Yes  Precautions/Restrictions  Precautions Precautions: Posterior Hip Precaution Comments: pt recalls 3/3 precautions and obeys without cues Restrictions Weight Bearing Restrictions: Yes LLE Weight Bearing: Weight bearing as tolerated  ADL - See FIM  Vision/Perception  Perception Perception: Within Functional Limits Praxis Praxis: Intact   Cognition Overall Cognitive Status: Appears within  functional limits for tasks assessed Arousal/Alertness: Awake/alert Orientation Level: Oriented X4 Problem Solving: Appears intact Safety/Judgment: Appears intact  Sensation Sensation Light Touch: Appears Intact Coordination Gross Motor Movements are Fluid and Coordinated: Yes (fluid bilateral UE movements) Fine Motor Movements are Fluid and Coordinated: Yes  Motor  Motor Motor: Abnormal tone (of bilateral LEs) Motor - Skilled Clinical Observations: increased tone B LEs  Trunk/Postural Assessment  Cervical Assessment Cervical Assessment: Within Functional Limits Thoracic Assessment Thoracic Assessment:  (slight curvature) Lumbar Assessment Lumbar Assessment:  (increased prominance of lower lumber vertebrae; slight curve) Postural Control Postural Control:  (generalized trunk weakness)   Balance Static Sitting Balance Static Sitting - Level of Assistance: 6: Modified independent (Device/Increase time) Dynamic Sitting Balance Dynamic Sitting - Level of Assistance: 6: Modified independent (Device/Increase time) Static Standing Balance Static Standing - Level of Assistance: 1: +2 Total assist (with sara lift and harness)  Extremity/Trunk Assessment RUE Assessment RUE Assessment: Within Functional Limits LUE Assessment LUE Assessment: Within Functional Limits  See FIM for current functional status  Samone Guhl 08/25/2011, 2:59 PM

## 2011-08-27 ENCOUNTER — Ambulatory Visit: Payer: Medicaid Other | Admitting: Family Medicine

## 2012-01-08 ENCOUNTER — Encounter: Payer: Self-pay | Admitting: Family Medicine

## 2012-01-08 ENCOUNTER — Ambulatory Visit (INDEPENDENT_AMBULATORY_CARE_PROVIDER_SITE_OTHER): Payer: Medicare Other | Admitting: Family Medicine

## 2012-01-08 VITALS — BP 131/76 | HR 60 | Ht 75.0 in

## 2012-01-08 DIAGNOSIS — S129XXA Fracture of neck, unspecified, initial encounter: Secondary | ICD-10-CM

## 2012-01-08 MED ORDER — BACLOFEN 20 MG PO TABS: 20.0000 mg | ORAL_TABLET | Freq: Four times a day (QID) | ORAL | Status: DC

## 2012-01-08 MED ORDER — BACLOFEN 10 MG PO TABS: 20.0000 mg | ORAL_TABLET | Freq: Four times a day (QID) | ORAL | Status: DC

## 2012-01-08 MED ORDER — TRAMADOL HCL 50 MG PO TABS: 50.0000 mg | ORAL_TABLET | Freq: Three times a day (TID) | ORAL | Status: DC | PRN

## 2012-01-08 MED ORDER — METHOCARBAMOL 500 MG PO TABS: 500.0000 mg | ORAL_TABLET | Freq: Three times a day (TID) | ORAL | Status: DC | PRN

## 2012-01-08 NOTE — Patient Instructions (Addendum)
Thank you for coming in today, it was good to see you I have refilled your medications Try the tramadol for pain instead of hydrocodone.

## 2012-01-11 NOTE — Progress Notes (Signed)
  Subjective:    Patient ID: Andrew Sullivan, male    DOB: [birth year], 41 y.o.   MRN: [id number]  HPI 1.  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  Was ambulating with crutches, however had fall in kitchen a few months ago that resulted in L hip fracture.  Subsequently underwent L hemiarthroplasty.  Now uses wheelchair for ambulation. Has been receiving baclofen for spasticity, also started on robaxin as well.  He has been taking hydrocodone for pain.  He would like a refill on all of his medications.  Denies problems with constipation or feeling tired on the medication.    Review of Systems Per HPI    Objective:   Physical Exam  Constitutional: He appears well-nourished. No distress.       In wheelchair.   HENT:  Head: Normocephalic and atraumatic.  Neurological: He is alert.          Assessment & Plan:

## 2012-01-11 NOTE — Assessment & Plan Note (Signed)
Skeletal spasticity 2/2 to c-spine fracture.  Unfortunately he also had a fall with L hip fracture.  He is doing well but has increased spasm, controlled with combination of robaxin and baclofen.  Has been using hydrocodone for pain.  I discussed with him that I am not comfortable continuing him on this.  We discussed using tramadol instead and he agrees to this.

## 2012-01-14 ENCOUNTER — Ambulatory Visit: Payer: Medicare Other | Attending: Orthopedic Surgery | Admitting: Physical Therapy

## 2012-01-14 DIAGNOSIS — R269 Unspecified abnormalities of gait and mobility: Secondary | ICD-10-CM | POA: Insufficient documentation

## 2012-01-14 DIAGNOSIS — M25559 Pain in unspecified hip: Secondary | ICD-10-CM | POA: Insufficient documentation

## 2012-01-14 DIAGNOSIS — R262 Difficulty in walking, not elsewhere classified: Secondary | ICD-10-CM | POA: Insufficient documentation

## 2012-01-14 DIAGNOSIS — IMO0001 Reserved for inherently not codable concepts without codable children: Secondary | ICD-10-CM | POA: Insufficient documentation

## 2012-01-19 ENCOUNTER — Ambulatory Visit: Payer: Medicare Other | Admitting: Rehabilitation

## 2012-01-21 ENCOUNTER — Ambulatory Visit: Payer: Medicare Other | Admitting: Physical Therapy

## 2012-01-26 ENCOUNTER — Ambulatory Visit: Payer: Medicare Other | Admitting: Physical Therapy

## 2012-01-28 ENCOUNTER — Ambulatory Visit: Payer: Medicare Other | Admitting: Physical Therapy

## 2012-02-02 ENCOUNTER — Ambulatory Visit: Payer: Medicare Other | Admitting: Physical Therapy

## 2012-02-04 ENCOUNTER — Ambulatory Visit: Payer: Medicare Other | Admitting: Physical Therapy

## 2012-02-09 ENCOUNTER — Ambulatory Visit: Payer: Medicare Other | Attending: Orthopedic Surgery | Admitting: Physical Therapy

## 2012-02-09 DIAGNOSIS — IMO0001 Reserved for inherently not codable concepts without codable children: Secondary | ICD-10-CM | POA: Insufficient documentation

## 2012-02-09 DIAGNOSIS — M25559 Pain in unspecified hip: Secondary | ICD-10-CM | POA: Insufficient documentation

## 2012-02-09 DIAGNOSIS — R269 Unspecified abnormalities of gait and mobility: Secondary | ICD-10-CM | POA: Insufficient documentation

## 2012-02-09 DIAGNOSIS — R262 Difficulty in walking, not elsewhere classified: Secondary | ICD-10-CM | POA: Insufficient documentation

## 2012-02-11 ENCOUNTER — Ambulatory Visit: Payer: Medicare Other | Admitting: Physical Therapy

## 2012-02-16 ENCOUNTER — Ambulatory Visit: Payer: Medicare Other | Admitting: Physical Therapy

## 2012-02-18 ENCOUNTER — Ambulatory Visit: Payer: Medicare Other | Admitting: Physical Therapy

## 2012-02-23 ENCOUNTER — Ambulatory Visit: Payer: Medicare Other | Admitting: Physical Therapy

## 2012-02-25 ENCOUNTER — Ambulatory Visit: Payer: Medicare Other | Admitting: Physical Therapy

## 2012-03-01 ENCOUNTER — Ambulatory Visit: Payer: Medicare Other | Admitting: Physical Therapy

## 2012-03-02 ENCOUNTER — Ambulatory Visit: Payer: Medicare Other | Admitting: Physical Therapy

## 2012-03-12 ENCOUNTER — Ambulatory Visit: Payer: Medicare Other | Attending: Orthopedic Surgery | Admitting: Physical Therapy

## 2012-03-12 DIAGNOSIS — IMO0001 Reserved for inherently not codable concepts without codable children: Secondary | ICD-10-CM | POA: Insufficient documentation

## 2012-03-12 DIAGNOSIS — M25559 Pain in unspecified hip: Secondary | ICD-10-CM | POA: Insufficient documentation

## 2012-03-12 DIAGNOSIS — R262 Difficulty in walking, not elsewhere classified: Secondary | ICD-10-CM | POA: Insufficient documentation

## 2012-03-12 DIAGNOSIS — R269 Unspecified abnormalities of gait and mobility: Secondary | ICD-10-CM | POA: Insufficient documentation

## 2012-03-15 ENCOUNTER — Encounter: Payer: Medicare Other | Admitting: Physical Therapy

## 2012-03-17 ENCOUNTER — Encounter: Payer: Medicare Other | Admitting: Physical Therapy

## 2012-03-22 ENCOUNTER — Encounter: Payer: Medicare Other | Admitting: Physical Therapy

## 2012-03-24 ENCOUNTER — Encounter: Payer: Medicare Other | Admitting: Physical Therapy

## 2012-03-29 ENCOUNTER — Encounter: Payer: Medicare Other | Admitting: Physical Therapy

## 2012-03-31 ENCOUNTER — Encounter: Payer: Medicare Other | Admitting: Physical Therapy

## 2012-04-05 ENCOUNTER — Encounter: Payer: Medicare Other | Admitting: Physical Therapy

## 2012-04-07 ENCOUNTER — Encounter: Payer: Medicare Other | Admitting: Physical Therapy

## 2012-08-05 ENCOUNTER — Encounter: Payer: Self-pay | Admitting: Family Medicine

## 2012-08-05 ENCOUNTER — Ambulatory Visit (INDEPENDENT_AMBULATORY_CARE_PROVIDER_SITE_OTHER): Payer: Medicare Other | Admitting: Family Medicine

## 2012-08-05 VITALS — BP 122/78 | HR 64 | Temp 98.5°F | Ht 75.0 in | Wt 168.0 lb

## 2012-08-05 DIAGNOSIS — R532 Functional quadriplegia: Secondary | ICD-10-CM

## 2012-08-05 MED ORDER — METHOCARBAMOL 500 MG PO TABS: 500.0000 mg | ORAL_TABLET | Freq: Three times a day (TID) | ORAL | Status: DC | PRN

## 2012-08-05 MED ORDER — BACLOFEN 20 MG PO TABS: 20.0000 mg | ORAL_TABLET | Freq: Four times a day (QID) | ORAL | Status: DC

## 2012-08-05 MED ORDER — TRAMADOL HCL 50 MG PO TABS: 50.0000 mg | ORAL_TABLET | Freq: Three times a day (TID) | ORAL | Status: DC | PRN

## 2012-08-05 NOTE — Assessment & Plan Note (Signed)
Functions fairly well, ambulatory.  Refilled muscles relaxers for spasciticy.  FOllow up in 6 months.

## 2012-08-05 NOTE — Patient Instructions (Addendum)
Thank you for coming in today, it was good to see you I have refilled your medications Follow up with urology as scheduled Follow up in 6 months or sooner as needed

## 2012-08-05 NOTE — Progress Notes (Signed)
  Subjective:    Patient ID: Andrew Sullivan, male    DOB: 07/14/1970, 42 y.o.   MRN: [id number]  HPI 1. Quadriplegia:  Here for follow up.  History of quadriplegia from cervical fracture.  Has spasticity associated with this that he uses baclofen and robaxin for.  He is ambulatory and uses arm crutches or walker.  He is followed urology for recurrent UTI's and has follow up next month.  Denies dysuria, fever, chills or recent falls.    Review of Systems PEr hPI    Objective:   Physical Exam  Constitutional: He appears well-nourished. No distress.  Cardiovascular: Normal rate and regular rhythm.   Musculoskeletal: He exhibits no edema.  Neurological:  Weakness in all extremities, worse in lower extremities.  Sensation diminished throughout lower extremities.            Assessment & Plan:

## 2012-11-16 ENCOUNTER — Ambulatory Visit (INDEPENDENT_AMBULATORY_CARE_PROVIDER_SITE_OTHER): Payer: Medicare Other | Admitting: Family Medicine

## 2012-11-16 ENCOUNTER — Encounter: Payer: Self-pay | Admitting: Family Medicine

## 2012-11-16 VITALS — BP 114/69 | HR 77 | Ht 75.0 in | Wt 167.0 lb

## 2012-11-16 DIAGNOSIS — R531 Weakness: Secondary | ICD-10-CM | POA: Insufficient documentation

## 2012-11-16 DIAGNOSIS — R532 Functional quadriplegia: Secondary | ICD-10-CM

## 2012-11-16 DIAGNOSIS — R5383 Other fatigue: Secondary | ICD-10-CM

## 2012-11-16 DIAGNOSIS — Z87440 Personal history of urinary (tract) infections: Secondary | ICD-10-CM

## 2012-11-16 DIAGNOSIS — R5381 Other malaise: Secondary | ICD-10-CM

## 2012-11-16 DIAGNOSIS — D649 Anemia, unspecified: Secondary | ICD-10-CM | POA: Insufficient documentation

## 2012-11-16 LAB — CBC WITH DIFFERENTIAL/PLATELET
Basophils Relative: 0 % (ref 0–1)
Eosinophils Absolute: 0.1 10*3/uL (ref 0.0–0.7)
Eosinophils Relative: 4 % (ref 0–5)
Lymphs Abs: 1.1 10*3/uL (ref 0.7–4.0)
MCH: 30.7 pg (ref 26.0–34.0)
MCHC: 34.5 g/dL (ref 30.0–36.0)
MCV: 88.9 fL (ref 78.0–100.0)
Monocytes Relative: 11 % (ref 3–12)
Neutrophils Relative %: 53 % (ref 43–77)
Platelets: 204 10*3/uL (ref 150–400)

## 2012-11-16 LAB — BASIC METABOLIC PANEL
CO2: 28 mEq/L (ref 19–32)
Calcium: 9.3 mg/dL (ref 8.4–10.5)
Creat: 1.02 mg/dL (ref 0.50–1.35)
Glucose, Bld: 107 mg/dL — ABNORMAL HIGH (ref 70–99)
Sodium: 139 mEq/L (ref 135–145)

## 2012-11-16 LAB — IRON AND TIBC: TIBC: 297 ug/dL (ref 215–435)

## 2012-11-16 MED ORDER — BACLOFEN 20 MG PO TABS: 20.0000 mg | ORAL_TABLET | Freq: Four times a day (QID) | ORAL | Status: DC

## 2012-11-16 MED ORDER — TRAMADOL HCL 50 MG PO TABS: 50.0000 mg | ORAL_TABLET | Freq: Three times a day (TID) | ORAL | Status: DC | PRN

## 2012-11-16 NOTE — Assessment & Plan Note (Signed)
Appears to be well controlled with baclofen and tramadol, refilled, f/u 6 months

## 2012-11-16 NOTE — Assessment & Plan Note (Signed)
Seen on last CBC Will repeat and add iron, TIBC, and ferritin also Will treat with iron if defficient

## 2012-11-16 NOTE — Patient Instructions (Signed)
It was great to see you today!  I will let you know if I find anything to explain your fatigue, otherwise check out mychart

## 2012-11-16 NOTE — Progress Notes (Signed)
  Subjective:    Patient ID: Andrew Sullivan, male    DOB: 01-23-1971, 42 y.o.   MRN: [id number]  HPI  Pt here for follow up of functional quadroplegia and weakness  Complains of fatigue and weakness/fatigue X 3 weeks. Denies any dysuria, fevers, chills, anhedonia, depressed mood, dyspnea, chest pain, diarrhea, constipation, and change in appetite. States that he cant think of any reason that he would have these symptoms.   Also here to get refill for baclofen, has functional quadroplegia from an old cvervical fracture. He states that he uses baclofen TID to QID and tramadol appro1-2 times daily for pain.   He does still folllow with urology for frequent UTI  Review of Systems Per HPI    Objective:   Physical Exam  Gen: NAD, alert, cooperative with exam, sitting in a wheelchair HEENT: NCAT CV: RRR, good S1/S2, no murmur Resp: CTABL, no wheezes, non-labored Abd: SNTND, BS present, no guarding or organomegaly Ext: No edema, warm Neuro: Alert and oriented, spasticity in BL hands,       Assessment & Plan:

## 2012-11-16 NOTE — Assessment & Plan Note (Addendum)
Possibly anemia related, denies poor mood, change in appetite, and symptoms of illness Discussed possibility of anemia and advised to RTC in 2 weeks if continued and CBC/anemia panel doesn't explain Sexually active, always uses condoms, he declined HIV testing despite my offer.

## 2012-11-16 NOTE — Assessment & Plan Note (Signed)
Considered with DDx for fatigue, no symptoms so deferred UA Would consider if continues

## 2012-11-17 LAB — FERRITIN: Ferritin: 25 ng/mL (ref 22–322)

## 2012-11-18 ENCOUNTER — Encounter: Payer: Self-pay | Admitting: Family Medicine

## 2013-02-11 ENCOUNTER — Telehealth: Payer: Self-pay | Admitting: Family Medicine

## 2013-02-11 MED ORDER — TRAMADOL HCL 50 MG PO TABS: 50.0000 mg | ORAL_TABLET | Freq: Three times a day (TID) | ORAL | Status: DC | PRN

## 2013-02-11 NOTE — Telephone Encounter (Signed)
Refilled tramadol, Patient follows up every 6 months and for some reason the pharmacy did not get his Rx in sept. They have sent me a letter requesting a refill and i have verified with them that it was not given to them.   Murtis Sink, MD [facility] Resident, PGY-2 02/11/2013, 8:29 AM

## 2013-04-29 ENCOUNTER — Telehealth: Payer: Self-pay | Admitting: Family Medicine

## 2013-04-29 NOTE — Telephone Encounter (Signed)
Pt called and needs a referral to the Outpatient clinic. jw

## 2013-04-29 NOTE — Telephone Encounter (Signed)
I called patient and told him he needs an appt to discuss referral since it has been since 11/2012 since we have seen patient.  Pt verbalized understanding.  Aivah Putman, Loralyn [place], [place]

## 2013-05-12 ENCOUNTER — Ambulatory Visit (INDEPENDENT_AMBULATORY_CARE_PROVIDER_SITE_OTHER): Payer: Commercial Managed Care - HMO | Admitting: Family Medicine

## 2013-05-12 VITALS — BP 118/60 | HR 70 | Ht 75.0 in

## 2013-05-12 DIAGNOSIS — R532 Functional quadriplegia: Secondary | ICD-10-CM

## 2013-05-12 DIAGNOSIS — Z113 Encounter for screening for infections with a predominantly sexual mode of transmission: Secondary | ICD-10-CM | POA: Insufficient documentation

## 2013-05-12 DIAGNOSIS — Z1159 Encounter for screening for other viral diseases: Secondary | ICD-10-CM | POA: Insufficient documentation

## 2013-05-12 DIAGNOSIS — L259 Unspecified contact dermatitis, unspecified cause: Secondary | ICD-10-CM | POA: Insufficient documentation

## 2013-05-12 DIAGNOSIS — R21 Rash and other nonspecific skin eruption: Secondary | ICD-10-CM | POA: Insufficient documentation

## 2013-05-12 MED ORDER — TRAMADOL HCL 50 MG PO TABS: 50.0000 mg | ORAL_TABLET | Freq: Three times a day (TID) | ORAL | Status: DC | PRN

## 2013-05-12 MED ORDER — TRIAMCINOLONE ACETONIDE 0.1 % EX OINT: 1.0000 "application " | TOPICAL_OINTMENT | Freq: Two times a day (BID) | CUTANEOUS | Status: DC

## 2013-05-12 MED ORDER — BACLOFEN 20 MG PO TABS: 20.0000 mg | ORAL_TABLET | Freq: Four times a day (QID) | ORAL | Status: DC

## 2013-05-12 NOTE — Assessment & Plan Note (Addendum)
From Hx seems like contact No signs of infection We'll treat with triamcinolone ointment x7 days If he turns had had syphilis could possibly be due to that also

## 2013-05-12 NOTE — Patient Instructions (Signed)
It was great to see you today  I will let you know what your test shows,.

## 2013-05-12 NOTE — Progress Notes (Signed)
Patient ID: Andrew Sullivan, male   DOB: 1970/06/06, 43 y.o.   MRN: [id number]  [name], MD Phone: [phone]  Subjective:  Chief complaint-noted  # Followup for functional quadriplegia and rash  Quadriplegia Patient states that tramadol baclofen are still helping as they used to, he notes that he has had hamstring tightness but limiting him His daily activities for the last month and a half and would like to go back to physical therapy. He states that he last went to PT one year ago and was thought to benefit from it. he needs tramadol and baclofen prescriptions today  Rash He has an itchy red bumpy rash in his chest after using a "cheap" body wash from the [employer] about 3 weeks ago. He denies any bleeding or fever of the area.  Rash on palms He doesn't really know in the spots develop on his palms but he is suspicious that it's from using Clorox to clean his bathroom. He is actually active but uses condoms consistently. He denies UTI symptoms today specifically dysuria, suprapubic pain, and back pain, and denies any lesions on his penis.  ROS- Per history of present illness  Past Medical History Patient Active Problem List   Diagnosis Date Noted  . Contact dermatitis 05/12/2013  . Rash of hands 05/12/2013  . Screening examination for venereal disease 05/12/2013  . Special screening examination for other specified viral diseases 05/12/2013  . Fatigue 11/16/2012  . Anemia 11/16/2012  . History of recurrent UTIs 05/19/2010  . Functional quadriplegia 05/10/2009  . HEMATURIA 07/29/2007  . VERTEBRAL FRACTURE, CERVICAL SPINE 07/29/2007    Medications- reviewed and updated Current Outpatient Prescriptions  Medication Sig Dispense Refill  . baclofen (LIORESAL) 20 MG tablet Take 1 tablet (20 mg total) by mouth 4 (four) times daily.  120 tablet  5  . traMADol (ULTRAM) 50 MG tablet Take 1-2 tablets (50-100 mg total) by mouth every 8 (eight) hours as needed for moderate  pain.  90 tablet  5  . triamcinolone ointment (KENALOG) 0.1 % Apply 1 application topically 2 (two) times daily.  30 g  0   No current facility-administered medications for this visit.    Objective: BP 118/60  Pulse 70  Ht 6\' 3"  (1.905 m)  SpO2 97% Gen: NAD, alert, cooperative with exam HEENT: NCAT CV: RRR, good S1/S2, no murmur Resp: CTABL, no wheezes, non-labored Abd: SNTND, BS present, no guarding or organomegaly Ext: No edema, small approximately 5 mm lightly hyperpigmented macules on bilateral palms more prominent on the right Neuro: Alert and oriented, No gross deficits   Assessment/Plan:  No problem-specific assessment & plan notes found for this encounter.   Orders Placed This Encounter  Procedures  . RPR  . Ambulatory referral to Physical Therapy    Referral Priority:  Routine    Referral Type:  Physical Medicine    Referral Reason:  Specialty Services Required    Requested Specialty:  Physical Therapy    Number of Visits Requested:  1    Meds ordered this encounter  Medications  . baclofen (LIORESAL) 20 MG tablet    Sig: Take 1 tablet (20 mg total) by mouth 4 (four) times daily.    Dispense:  120 tablet    Refill:  5  . traMADol (ULTRAM) 50 MG tablet    Sig: Take 1-2 tablets (50-100 mg total) by mouth every 8 (eight) hours as needed for moderate pain.    Dispense:  90 tablet    Refill:  5    Please disregard tramadol Rx from 9/9 if it is given to you. Thanks!  Marland Kitchen triamcinolone ointment (KENALOG) 0.1 %    Sig: Apply 1 application topically 2 (two) times daily.    Dispense:  30 g    Refill:  0

## 2013-05-12 NOTE — Assessment & Plan Note (Signed)
Rash concerning for syphilis related problem rash The patient denies lesions on penis and declines exam today Check RPR, also wanted to check HIV however I could not find a code that allow me to order for this to be covered in epic. Tried V74.5 and V73.89

## 2013-05-12 NOTE — Assessment & Plan Note (Signed)
Well-controlled in general on baclofen and tramadol Some muscle tightness recently would like physical therapy referral, I agree and made the referral Refilled baclofen and tramadol for 6 months

## 2013-05-13 LAB — RPR

## 2013-05-14 ENCOUNTER — Encounter: Payer: Self-pay | Admitting: Family Medicine

## 2013-05-17 ENCOUNTER — Telehealth: Payer: Self-pay | Admitting: *Deleted

## 2013-05-17 NOTE — Telephone Encounter (Signed)
Called pt. Informed of neg RPR test. .Andrew Sullivan

## 2013-06-09 ENCOUNTER — Ambulatory Visit: Payer: Medicare HMO | Attending: Family Medicine | Admitting: Physical Therapy

## 2013-06-09 DIAGNOSIS — M6281 Muscle weakness (generalized): Secondary | ICD-10-CM | POA: Diagnosis not present

## 2013-06-09 DIAGNOSIS — M629 Disorder of muscle, unspecified: Secondary | ICD-10-CM | POA: Insufficient documentation

## 2013-06-09 DIAGNOSIS — IMO0001 Reserved for inherently not codable concepts without codable children: Secondary | ICD-10-CM | POA: Diagnosis present

## 2013-06-09 DIAGNOSIS — M2420 Disorder of ligament, unspecified site: Secondary | ICD-10-CM | POA: Diagnosis not present

## 2013-06-09 DIAGNOSIS — R532 Functional quadriplegia: Secondary | ICD-10-CM | POA: Insufficient documentation

## 2013-06-09 DIAGNOSIS — R269 Unspecified abnormalities of gait and mobility: Secondary | ICD-10-CM | POA: Insufficient documentation

## 2013-06-15 ENCOUNTER — Ambulatory Visit: Payer: Medicare Other | Admitting: Physical Therapy

## 2013-06-17 ENCOUNTER — Ambulatory Visit: Payer: Medicare HMO | Admitting: Physical Therapy

## 2013-06-17 DIAGNOSIS — IMO0001 Reserved for inherently not codable concepts without codable children: Secondary | ICD-10-CM | POA: Diagnosis not present

## 2013-06-21 ENCOUNTER — Ambulatory Visit: Payer: Medicare Other | Admitting: Physical Therapy

## 2013-06-22 ENCOUNTER — Telehealth: Payer: Self-pay | Admitting: Physical Medicine & Rehabilitation

## 2013-06-22 ENCOUNTER — Ambulatory Visit: Payer: Medicare HMO | Admitting: Rehabilitative and Restorative Service Providers"

## 2013-06-22 DIAGNOSIS — IMO0001 Reserved for inherently not codable concepts without codable children: Secondary | ICD-10-CM | POA: Diagnosis not present

## 2013-06-22 NOTE — Telephone Encounter (Signed)
Message copied by Meredith Staggers on Wed Jun 22, 2013  5:23 PM ------      Message from: Laureen Abrahams      Created: Wed Jun 22, 2013  3:20 PM      Regarding: patient referral       Recommend this patient be referred for spasticity management before continuing with physical therapy.              Can your office contact this patient or do we need to give him your number?            Thanks-      Faustino Congress, PT, DPT      [phone] ------

## 2013-06-22 NOTE — Telephone Encounter (Signed)
My office can contact the patient if his number is correct. Options will be limited if he doesn't have insurance.

## 2013-06-23 NOTE — Telephone Encounter (Signed)
Please enter the referral in epic.  Thank you.

## 2013-06-24 ENCOUNTER — Ambulatory Visit: Payer: Medicare HMO | Admitting: Physical Therapy

## 2013-06-24 DIAGNOSIS — IMO0001 Reserved for inherently not codable concepts without codable children: Secondary | ICD-10-CM | POA: Diagnosis not present

## 2013-06-28 ENCOUNTER — Ambulatory Visit: Payer: Medicare Other | Admitting: Physical Therapy

## 2013-07-01 ENCOUNTER — Ambulatory Visit: Payer: Medicare Other | Admitting: Physical Therapy

## 2013-07-05 ENCOUNTER — Ambulatory Visit: Payer: Medicare Other | Admitting: Physical Therapy

## 2013-07-07 ENCOUNTER — Ambulatory Visit: Payer: Medicare Other | Admitting: Physical Therapy

## 2013-08-12 ENCOUNTER — Other Ambulatory Visit: Payer: Self-pay | Admitting: *Deleted

## 2013-08-12 DIAGNOSIS — R532 Functional quadriplegia: Secondary | ICD-10-CM

## 2013-08-12 NOTE — Telephone Encounter (Signed)
Denied refill on tramadol as I gave 5 refills 2 months ago. I think this is probably a pharmacy error.   Laroy Apple, MD [place] Resident, PGY-2 08/12/2013, 2:25 PM

## 2013-09-13 ENCOUNTER — Other Ambulatory Visit: Payer: Self-pay | Admitting: *Deleted

## 2013-09-13 DIAGNOSIS — R532 Functional quadriplegia: Secondary | ICD-10-CM

## 2013-09-14 NOTE — Telephone Encounter (Signed)
Cannot refill pain meds without an OV. Will ask staff to inform. I gave him 6 months refill on 3/5.    Laroy Apple, MD [place] Resident, PGY-3 09/14/2013, 8:08 AM

## 2013-09-19 ENCOUNTER — Encounter: Payer: Self-pay | Admitting: Family Medicine

## 2013-09-19 ENCOUNTER — Ambulatory Visit (INDEPENDENT_AMBULATORY_CARE_PROVIDER_SITE_OTHER): Payer: Commercial Managed Care - HMO | Admitting: Family Medicine

## 2013-09-19 VITALS — BP 127/76 | HR 62 | Temp 98.1°F | Ht 75.0 in | Wt 167.0 lb

## 2013-09-19 DIAGNOSIS — R532 Functional quadriplegia: Secondary | ICD-10-CM

## 2013-09-19 MED ORDER — TRAMADOL HCL 50 MG PO TABS: 50.0000 mg | ORAL_TABLET | Freq: Three times a day (TID) | ORAL | Status: DC | PRN

## 2013-09-19 NOTE — Progress Notes (Signed)
Patient ID: Andrew Sullivan, male   DOB: [birth year], 43 y.o.   MRN: [id number]  [name], MD Phone: [phone]  Subjective:  Chief complaint-noted  Pt Here for a left spastic quadriplegia and associated chronic pain.  His pain is mostly in his L upper leg where he had an old hip surgery. He state sthat th epain is well controlled with the normal amount of tramadol prescribed but that hes been out for about 1 month. It allows him to participate in his PT and to more easily perform his ADLs  He would like to see [facility], [facility] and requests seeing his old clinic b/c they are more familiar.   ROS-  He denies CP, dyspnea, or other complaints.   Past Medical History Patient Active Problem List   Diagnosis Date Noted  . Contact dermatitis 05/12/2013  . Rash of hands 05/12/2013  . Fatigue 11/16/2012  . Anemia 11/16/2012  . History of recurrent UTIs 05/19/2010  . Functional quadriplegia 05/10/2009  . HEMATURIA 07/29/2007  . VERTEBRAL FRACTURE, CERVICAL SPINE 07/29/2007    Medications- reviewed and updated Current Outpatient Prescriptions  Medication Sig Dispense Refill  . baclofen (LIORESAL) 20 MG tablet Take 1 tablet (20 mg total) by mouth 4 (four) times daily.  120 tablet  5  . traMADol (ULTRAM) 50 MG tablet Take 1-2 tablets (50-100 mg total) by mouth every 8 (eight) hours as needed for moderate pain.  90 tablet  5  . triamcinolone ointment (KENALOG) 0.1 % Apply 1 application topically 2 (two) times daily.  30 g  0   No current facility-administered medications for this visit.    Objective: BP 127/76  Pulse 62  Temp(Src) 98.1 F (36.7 C) (Oral)  Ht 6\' 3"  (1.905 m)  Wt 167 lb (75.751 kg)  BMI 20.87 kg/m2 Gen: NAD, alert, cooperative with exam, wheelchair bound HEENT: NCAT CV: RRR, good S1/S2, no murmur Resp: CTABL, no wheezes, non-labored Ext: No edema, warm Neuro: Alert and oriented, No gross deficits   Assessment/Plan:  Functional  quadriplegia Doing well Pain meds out so he has had more pain lately Refill tramadol Refer to PM&R for possible  recommendations about spasticity Referral for PT written again.  Follow up 6 months    Orders Placed This Encounter  Procedures  . Ambulatory referral to Physical Medicine Rehab    Referral Priority:  Routine    Referral Type:  Rehabilitation    Referral Reason:  Specialty Services Required    Requested Specialty:  Physical Medicine and Rehabilitation    Number of Visits Requested:  1  . Ambulatory referral to Physical Therapy    Referral Priority:  Routine    Referral Type:  Physical Medicine    Referral Reason:  Specialty Services Required    Requested Specialty:  Physical Therapy    Number of Visits Requested:  1    Meds ordered this encounter  Medications  . traMADol (ULTRAM) 50 MG tablet    Sig: Take 1-2 tablets (50-100 mg total) by mouth every 8 (eight) hours as needed for moderate pain.    Dispense:  90 tablet    Refill:  5

## 2013-09-19 NOTE — Patient Instructions (Signed)
Great to see you again!  You should hear from the PM&R clinic soon.   You should also hear from physical therapy.

## 2013-09-19 NOTE — Assessment & Plan Note (Signed)
Doing well Pain meds out so he has had more pain lately Refill tramadol Refer to PM&R for possible  recommendations about spasticity Referral for PT written again.  Follow up 6 months

## 2013-09-30 ENCOUNTER — Ambulatory Visit: Payer: Medicaid Other | Admitting: Physical Therapy

## 2013-10-04 ENCOUNTER — Ambulatory Visit: Payer: Medicare HMO | Attending: Family Medicine

## 2013-10-04 DIAGNOSIS — IMO0001 Reserved for inherently not codable concepts without codable children: Secondary | ICD-10-CM | POA: Diagnosis not present

## 2013-10-04 DIAGNOSIS — M25569 Pain in unspecified knee: Secondary | ICD-10-CM | POA: Diagnosis not present

## 2013-10-04 DIAGNOSIS — M2560 Stiffness of unspecified joint, not elsewhere classified: Secondary | ICD-10-CM | POA: Insufficient documentation

## 2013-10-04 DIAGNOSIS — R5381 Other malaise: Secondary | ICD-10-CM | POA: Diagnosis not present

## 2013-10-05 ENCOUNTER — Ambulatory Visit: Payer: Medicare HMO | Admitting: Rehabilitation

## 2013-10-05 DIAGNOSIS — IMO0001 Reserved for inherently not codable concepts without codable children: Secondary | ICD-10-CM | POA: Diagnosis not present

## 2013-10-12 ENCOUNTER — Ambulatory Visit: Payer: Medicare HMO | Attending: Family Medicine | Admitting: Physical Therapy

## 2013-10-12 ENCOUNTER — Encounter: Payer: Commercial Managed Care - HMO | Admitting: Rehabilitation

## 2013-10-12 DIAGNOSIS — IMO0001 Reserved for inherently not codable concepts without codable children: Secondary | ICD-10-CM | POA: Insufficient documentation

## 2013-10-12 DIAGNOSIS — M25569 Pain in unspecified knee: Secondary | ICD-10-CM | POA: Insufficient documentation

## 2013-10-12 DIAGNOSIS — R5381 Other malaise: Secondary | ICD-10-CM | POA: Diagnosis not present

## 2013-10-12 DIAGNOSIS — M2560 Stiffness of unspecified joint, not elsewhere classified: Secondary | ICD-10-CM | POA: Diagnosis not present

## 2013-10-14 ENCOUNTER — Ambulatory Visit: Payer: Medicare HMO | Admitting: Physical Therapy

## 2013-10-14 DIAGNOSIS — IMO0001 Reserved for inherently not codable concepts without codable children: Secondary | ICD-10-CM | POA: Diagnosis not present

## 2013-10-18 ENCOUNTER — Ambulatory Visit: Payer: Medicare HMO | Admitting: Rehabilitation

## 2013-10-18 ENCOUNTER — Encounter: Payer: Self-pay | Admitting: Physical Medicine & Rehabilitation

## 2013-10-18 DIAGNOSIS — IMO0001 Reserved for inherently not codable concepts without codable children: Secondary | ICD-10-CM | POA: Diagnosis not present

## 2013-10-20 ENCOUNTER — Ambulatory Visit: Payer: Medicare HMO | Admitting: Physical Therapy

## 2013-10-20 DIAGNOSIS — IMO0001 Reserved for inherently not codable concepts without codable children: Secondary | ICD-10-CM | POA: Diagnosis not present

## 2013-10-24 ENCOUNTER — Ambulatory Visit: Payer: Medicare HMO | Admitting: Physical Therapy

## 2013-10-24 DIAGNOSIS — IMO0001 Reserved for inherently not codable concepts without codable children: Secondary | ICD-10-CM | POA: Diagnosis not present

## 2013-10-26 ENCOUNTER — Ambulatory Visit: Payer: Medicare HMO | Admitting: Physical Therapy

## 2013-10-26 DIAGNOSIS — IMO0001 Reserved for inherently not codable concepts without codable children: Secondary | ICD-10-CM | POA: Diagnosis not present

## 2013-11-01 ENCOUNTER — Ambulatory Visit: Payer: Medicare HMO

## 2013-11-01 ENCOUNTER — Ambulatory Visit: Payer: Medicare HMO | Admitting: Physical Therapy

## 2013-11-01 DIAGNOSIS — IMO0001 Reserved for inherently not codable concepts without codable children: Secondary | ICD-10-CM | POA: Diagnosis not present

## 2013-11-03 ENCOUNTER — Ambulatory Visit: Payer: Medicare HMO | Admitting: Physical Therapy

## 2013-11-03 DIAGNOSIS — IMO0001 Reserved for inherently not codable concepts without codable children: Secondary | ICD-10-CM | POA: Diagnosis not present

## 2013-11-05 ENCOUNTER — Encounter (HOSPITAL_COMMUNITY): Payer: Self-pay | Admitting: Emergency Medicine

## 2013-11-05 ENCOUNTER — Emergency Department (HOSPITAL_COMMUNITY)
Admission: EM | Admit: 2013-11-05 | Discharge: 2013-11-05 | Disposition: A | Discharge status: Home / Self Care | Payer: Medicare HMO | Attending: Emergency Medicine | Admitting: Emergency Medicine

## 2013-11-05 DIAGNOSIS — R319 Hematuria, unspecified: Secondary | ICD-10-CM | POA: Diagnosis present

## 2013-11-05 DIAGNOSIS — Z8669 Personal history of other diseases of the nervous system and sense organs: Secondary | ICD-10-CM | POA: Diagnosis not present

## 2013-11-05 DIAGNOSIS — Z880 Allergy status to penicillin: Secondary | ICD-10-CM | POA: Diagnosis not present

## 2013-11-05 DIAGNOSIS — Z79899 Other long term (current) drug therapy: Secondary | ICD-10-CM | POA: Diagnosis not present

## 2013-11-05 DIAGNOSIS — Z87891 Personal history of nicotine dependence: Secondary | ICD-10-CM | POA: Insufficient documentation

## 2013-11-05 DIAGNOSIS — D649 Anemia, unspecified: Secondary | ICD-10-CM | POA: Diagnosis present

## 2013-11-05 DIAGNOSIS — Z87440 Personal history of urinary (tract) infections: Secondary | ICD-10-CM | POA: Insufficient documentation

## 2013-11-05 LAB — URINALYSIS, ROUTINE W REFLEX MICROSCOPIC
Glucose, UA: NEGATIVE mg/dL
KETONES UR: 15 mg/dL — AB
NITRITE: POSITIVE — AB
PROTEIN: 100 mg/dL — AB
Specific Gravity, Urine: 1.024 (ref 1.005–1.030)
UROBILINOGEN UA: 1 mg/dL (ref 0.0–1.0)
pH: 6 (ref 5.0–8.0)

## 2013-11-05 LAB — URINE MICROSCOPIC-ADD ON

## 2013-11-05 MED ORDER — SULFAMETHOXAZOLE-TRIMETHOPRIM 800-160 MG PO TABS: 1.0000 | ORAL_TABLET | Freq: Two times a day (BID) | ORAL | Status: AC

## 2013-11-05 NOTE — ED Notes (Signed)
Pt reports having gross blood in urine since Friday. Hx of same- urologist Dr. Arther Dames. Pt also reports R flank pain when his bladder is full

## 2013-11-05 NOTE — ED Provider Notes (Signed)
CSN: [id number]     Arrival date & time 11/05/13  1846 History   First MD Initiated Contact with Patient 11/05/13 2110     Chief Complaint  Patient presents with  . Hematuria     (Consider location/radiation/quality/duration/timing/severity/associated sxs/prior Treatment) Patient is a 43 y.o. male presenting with hematuria. The history is provided by the patient.  Hematuria This is a recurrent problem. Episode onset: 3 days ago. The problem occurs constantly. The problem has been unchanged. Pertinent negatives include no abdominal pain, anorexia, arthralgias, change in bowel habit, chest pain, congestion, diaphoresis, fatigue, fever, headaches, joint swelling, myalgias, nausea, neck pain, numbness, rash, sore throat, swollen glands, urinary symptoms, vertigo, visual change, vomiting or weakness. Associated symptoms comments: Left flank pain - occurs when bladder is full, relieved when he urinates, character is fullness, it is minimal in severity. Nothing aggravates the symptoms. He has tried nothing for the symptoms. The treatment provided no relief.    Past Medical History  Diagnosis Date  . Cervical spine fracture     C4-C6 with fusion  . Paraparesis of both lower limbs   . History of recurrent UTIs   . Erectile dysfunction    Past Surgical History  Procedure Laterality Date  . Cervical fusion    . Hip arthroplasty  08/12/2011    Procedure: ARTHROPLASTY BIPOLAR HIP;  Surgeon: Mauri Pole, MD;  Location: [facility];  Service: Orthopedics;  Laterality: Left;  Hemi-arthroplasty   No family history on file. History  Substance Use Topics  . Smoking status: Former Research scientist (life sciences)  . Smokeless tobacco: Never Used  . Alcohol Use: No    Review of Systems  Constitutional: Negative for fever, diaphoresis and fatigue.  HENT: Negative for congestion and sore throat.   Cardiovascular: Negative for chest pain.  Gastrointestinal: Negative for nausea, vomiting, abdominal pain, anorexia and change in  bowel habit.  Genitourinary: Positive for hematuria.  Musculoskeletal: Negative for arthralgias, joint swelling, myalgias and neck pain.  Skin: Negative for rash.  Neurological: Negative for vertigo, weakness, numbness and headaches.  All other systems reviewed and are negative.     Allergies  Penicillins  Home Medications   Prior to Admission medications   Medication Sig Start Date End Date Taking? Authorizing Provider  baclofen (LIORESAL) 20 MG tablet Take 1 tablet (20 mg total) by mouth 4 (four) times daily. 05/12/13  Yes Timmothy Euler, MD  traMADol (ULTRAM) 50 MG tablet Take 1-2 tablets (50-100 mg total) by mouth every 8 (eight) hours as needed for moderate pain. 09/19/13  Yes Timmothy Euler, MD  sulfamethoxazole-trimethoprim (SEPTRA DS) 800-160 MG per tablet Take 1 tablet by mouth every 12 (twelve) hours. 11/05/13 11/12/13  Kelby Aline, MD   BP 121/80  Pulse 62  Temp(Src) 97.8 F (36.6 C) (Oral)  Resp 16  Ht 6\' 3"  (1.905 m)  Wt 167 lb (75.751 kg)  BMI 20.87 kg/m2  SpO2 97% Physical Exam  Nursing note and vitals reviewed. Constitutional: He is oriented to person, place, and time. He appears well-developed and well-nourished. No distress.  HENT:  Head: Normocephalic and atraumatic.  Eyes: Conjunctivae and EOM are normal. Right eye exhibits no discharge. Left eye exhibits no discharge.  Neck: Normal range of motion. Neck supple. No tracheal deviation present.  Cardiovascular: Normal rate, regular rhythm and normal heart sounds.  Exam reveals no friction rub.   No murmur heard. Pulmonary/Chest: Effort normal and breath sounds normal. No stridor. No respiratory distress. He has no wheezes. He has no  rales. He exhibits no tenderness.  Abdominal: Soft. He exhibits no distension. There is no tenderness. There is no rebound and no guarding.  Neurological: He is alert and oriented to person, place, and time.  Skin: Skin is warm.  Psychiatric: He has a normal mood and affect.     ED Course  Procedures (including critical care time) Labs Review Labs Reviewed  URINALYSIS, ROUTINE W REFLEX MICROSCOPIC - Abnormal; Notable for the following:    Color, Urine RED (*)    APPearance TURBID (*)    Hgb urine dipstick LARGE (*)    Bilirubin Urine MODERATE (*)    Ketones, ur 15 (*)    Protein, ur 100 (*)    Nitrite POSITIVE (*)    Leukocytes, UA MODERATE (*)    All other components within normal limits  URINE MICROSCOPIC-ADD ON - Abnormal; Notable for the following:    Bacteria, UA FEW (*)    All other components within normal limits  URINE CULTURE    Imaging Review No results found.   EKG Interpretation None      MDM   Final diagnoses:  Hematuria    Pt with previous history of hematuria presents with hematuria. No symptoms or VS abn suggestive of acute blood loss anemia. Urinating without difficulty - liekly no obstructing clot burden. UA suggestive of UTI. Previous culture sensitive to Bactrim, as he reports constipation with Cipro. Allergy to PCN so deferred Keflex. FU with urology in <1 week, which he reports he can do. He is established with a urologist he reports.return precautions for acute blood loss anemia, and/or sing/ssx of obstruction due to clot burden. Strong return precautions given for worsening symptoms or any other alarming or concerning symptoms or issues. The patient was in agreement with the treatment plan and I answered all of their questions. The patient was stable for dc. At dc, the patient ambulated without difficulty, was moving all four extremities, symptoms improved, NAD. and AOx4 Care discussed with my attending, Dr. Christy Gentles. If performed and available, imaging studies and labs reviewed.    Kelby Aline, MD 11/06/13 [phone]

## 2013-11-05 NOTE — ED Notes (Signed)
Paged security to help pt to his car

## 2013-11-05 NOTE — Discharge Instructions (Signed)

## 2013-11-06 NOTE — ED Provider Notes (Signed)
I have personally seen and examined the patient.  I have discussed the plan of care with the resident.  I have reviewed the documentation on PMH/FH/Soc. History.  I have reviewed the documentation of the resident and agree.  Pt well appearing, no distress, watching TV He is agreeable with plan to treat with antibiotics Stable for d/c home  Sharyon Cable, MD 11/06/13 2013

## 2013-11-07 LAB — URINE CULTURE: Colony Count: 2000

## 2013-11-09 ENCOUNTER — Telehealth: Payer: Self-pay | Admitting: Family Medicine

## 2013-11-09 ENCOUNTER — Ambulatory Visit: Payer: Medicare HMO | Attending: Family Medicine | Admitting: Rehabilitation

## 2013-11-09 DIAGNOSIS — R5381 Other malaise: Secondary | ICD-10-CM | POA: Insufficient documentation

## 2013-11-09 DIAGNOSIS — M2560 Stiffness of unspecified joint, not elsewhere classified: Secondary | ICD-10-CM | POA: Insufficient documentation

## 2013-11-09 DIAGNOSIS — IMO0001 Reserved for inherently not codable concepts without codable children: Secondary | ICD-10-CM | POA: Insufficient documentation

## 2013-11-09 DIAGNOSIS — M25569 Pain in unspecified knee: Secondary | ICD-10-CM | POA: Diagnosis not present

## 2013-11-09 DIAGNOSIS — R319 Hematuria, unspecified: Secondary | ICD-10-CM

## 2013-11-09 NOTE — Telephone Encounter (Signed)
Lori from [facility] calls, pt has appt there is morning and they are needing a [facility] referral. Also, needing referral to be placed in EPIC. Pt was seen in ED for on 11/05/13 for Hematuria (599.7). Please call Cecille Rubin back once referral has been placed online [phone] ext [phone].

## 2013-11-09 NOTE — Telephone Encounter (Signed)
[facility] referral Auth ID [id number].

## 2013-11-09 NOTE — Telephone Encounter (Signed)
Tia has already received authorization and contacted Cecille Rubin.  Will forward to MD to place referral. Celeste Tavenner, Salome Spotted

## 2013-11-09 NOTE — Telephone Encounter (Signed)
Referral placed in epic.   Will ask team to notify.   Laroy Apple, MD [place] Resident, PGY-3 11/09/2013, 12:00 PM

## 2013-11-09 NOTE — Telephone Encounter (Signed)
Got it, we took care of it. Cory Kitt, Sal[facility] Spotted

## 2013-11-10 ENCOUNTER — Ambulatory Visit: Payer: Medicare HMO | Admitting: Rehabilitation

## 2013-11-10 DIAGNOSIS — IMO0001 Reserved for inherently not codable concepts without codable children: Secondary | ICD-10-CM | POA: Diagnosis not present

## 2013-11-15 ENCOUNTER — Ambulatory Visit: Payer: Medicare HMO | Admitting: Physical Therapy

## 2013-11-15 DIAGNOSIS — IMO0001 Reserved for inherently not codable concepts without codable children: Secondary | ICD-10-CM | POA: Diagnosis not present

## 2013-11-17 ENCOUNTER — Ambulatory Visit: Payer: Medicare HMO | Admitting: Physical Therapy

## 2013-11-17 DIAGNOSIS — IMO0001 Reserved for inherently not codable concepts without codable children: Secondary | ICD-10-CM | POA: Diagnosis not present

## 2013-11-21 ENCOUNTER — Ambulatory Visit: Payer: Medicare HMO | Admitting: Physical Therapy

## 2013-11-21 DIAGNOSIS — IMO0001 Reserved for inherently not codable concepts without codable children: Secondary | ICD-10-CM | POA: Diagnosis not present

## 2013-11-23 ENCOUNTER — Ambulatory Visit: Payer: Medicare HMO | Admitting: Rehabilitation

## 2013-11-23 DIAGNOSIS — IMO0001 Reserved for inherently not codable concepts without codable children: Secondary | ICD-10-CM | POA: Diagnosis not present

## 2013-11-29 ENCOUNTER — Ambulatory Visit: Payer: Medicare HMO | Admitting: Physical Therapy

## 2013-11-29 DIAGNOSIS — IMO0001 Reserved for inherently not codable concepts without codable children: Secondary | ICD-10-CM | POA: Diagnosis not present

## 2013-12-01 ENCOUNTER — Ambulatory Visit: Payer: Medicare HMO | Admitting: Physical Therapy

## 2013-12-01 DIAGNOSIS — IMO0001 Reserved for inherently not codable concepts without codable children: Secondary | ICD-10-CM | POA: Diagnosis not present

## 2013-12-06 ENCOUNTER — Encounter: Payer: Medicare HMO | Attending: Physical Medicine & Rehabilitation | Admitting: Physical Medicine & Rehabilitation

## 2013-12-06 ENCOUNTER — Encounter: Payer: Commercial Managed Care - HMO | Admitting: Physical Therapy

## 2013-12-06 ENCOUNTER — Encounter: Payer: Self-pay | Admitting: Physical Medicine & Rehabilitation

## 2013-12-06 VITALS — BP 133/71 | HR 78 | Resp 14 | Ht 75.0 in | Wt 176.0 lb

## 2013-12-06 DIAGNOSIS — R259 Unspecified abnormal involuntary movements: Secondary | ICD-10-CM | POA: Insufficient documentation

## 2013-12-06 DIAGNOSIS — Z87828 Personal history of other (healed) physical injury and trauma: Secondary | ICD-10-CM | POA: Insufficient documentation

## 2013-12-06 DIAGNOSIS — Z87440 Personal history of urinary (tract) infections: Secondary | ICD-10-CM

## 2013-12-06 DIAGNOSIS — IMO0002 Reserved for concepts with insufficient information to code with codable children: Secondary | ICD-10-CM | POA: Diagnosis not present

## 2013-12-06 DIAGNOSIS — N319 Neuromuscular dysfunction of bladder, unspecified: Secondary | ICD-10-CM | POA: Insufficient documentation

## 2013-12-06 DIAGNOSIS — M24569 Contracture, unspecified knee: Secondary | ICD-10-CM | POA: Diagnosis not present

## 2013-12-06 DIAGNOSIS — M79609 Pain in unspecified limb: Secondary | ICD-10-CM | POA: Diagnosis present

## 2013-12-06 DIAGNOSIS — Z792 Long term (current) use of antibiotics: Secondary | ICD-10-CM | POA: Diagnosis not present

## 2013-12-06 DIAGNOSIS — G8250 Quadriplegia, unspecified: Secondary | ICD-10-CM

## 2013-12-06 DIAGNOSIS — G8254 Quadriplegia, C5-C7 incomplete: Secondary | ICD-10-CM | POA: Diagnosis not present

## 2013-12-06 MED ORDER — TIZANIDINE HCL 2 MG PO TABS: 2.0000 mg | ORAL_TABLET | Freq: Four times a day (QID) | ORAL | Status: DC | PRN

## 2013-12-06 NOTE — Progress Notes (Signed)
This an initial evaluation for [name]. He is a 43 yo [place] American male who suffered a spinal cord injury in 1988. I have seen him once in the past---after a left femoral neck fracture he suffered in 2013. He was a patient of mine on the inpatient rehab service. I have not seen him since this admission. His main purpose for the visit today is for treatment of pain in his legs.  Andrew Sullivan has had chronic spasticity as related to his injury which was in the cervical spine. He has been on baclofen 20mg  qid chronically which seems to work fairly well. He uses tramadol for breakthrough pain and spasms but doesn't want to be dependent on the medication anymore. Also, the tramadol doesn't help the spasms much.  He does try to do some daily stretching, utilize, heat, ice, etc. He was recently in therapy as well for hip/knee ROM over at [facility] ortho rehab location. He sees [facility] chronically for his neurogenic bladder. He suffers from chronic UTi's and is currently on an abx for treatment. He tells me that he otherwise is continent of urine and is able to tell when his bladder needs emptying. He also claims to be continent of bowel as well.  Andrew Sullivan denies breakdown of his skin. He uses a manual wheelchair for propulsion. He states that he can walk short dx at home with a rolling walker and that he is able to use his legs to assist with transfers. He is generally very active physically speaking. He goes to [employer] 3x per week and does home exercises the other days he's not there.   ROS: postive for UTI's. Denies sob, cp, skin breakdown, etx   PE:   Gen: pleasant, alert/oriented.  HEENT: fair dentition, oral mucosa moist Hrt: reg rate Chest: clear Abd: non tender non distended Musc: hamstrings tight bilaterally---could not passively range past -30 neutral extension Neuro: alert and oriented x3. Good insight and awareness. Speech clear. MMT: Bilateral upper: deltoid, bicep, wrist all  5/5. Tricep 4+/5. HI 3- to 3/5 with some atrophy.  RLE-tr to 1HF, 1 HAD, 0-tr KE,KF, ADF/APF. LLE-tr HF and HAD and 0/5 KE, KF, ADF/APF. Decreased hot/cold sensation and pain sense below the waist---difficult to assess along the trunk but slightly altered perhaps. DTR's 1+ UE's, LE's 3+ patella, Achilles. Clonus 3-4 beats right ankle, sustained left ankle. Skin: intact Psych: mood appropriate   Assessment: 1. C7 SCI incomplete with spastic tetraplegia more involving the LE's 2. Pain syndrome due to the above with flexion contractures at both knees 3. Neurogenic bladder/recurrent UTI's  Plan: 1. Continue baclofen at current dosing, 20mg  qid 2. Initiate a trial of zanaflex 2mg  q6 prn. May transition to scheduled dosing based on efficacy 3. Maintain aggressive HEP to stretch either knee.  4. Rx UTI per urology 5. Likely will require botox to maximize his knee rom, pain/spasticity control. He doesn't seem overly anxious to pursue at this time but appears open in the future to this option 6. Follow up with me in about 6 weeks.  Forty minutes of direct patient care/interaction were spent today during this office encounter   Oval Linsey, MD, [facility] 12/06/13

## 2013-12-06 NOTE — Progress Notes (Signed)
   Subjective:    Patient ID: Andrew Sullivan, male    DOB: 1970-04-24, 43 y.o.   MRN: [id number]  HPI Pain Inventory Average Pain 4 /5 Pain Right Now 4/5 My pain is constant, stabbing and aching  In the last 24 hours, has pain interfered with the following? General activity 3 Relation with others 3 Enjoyment of life 0 What TIME of day is your pain at its worst? morning Sleep (in general) Good  Pain is worse with: walking and standing Pain improves with: therapy/exercise and medication Relief from Meds: 0  Mobility use a wheelchair  Function disabled: date disabled 1988  Neuro/Psych weakness spasms  Prior Studies Any changes since last visit?  no  Physicians involved in your care Any changes since last visit?  no   No family history on file. History   Social History  . Marital Status: Single    Spouse Name: N/A    Number of Children: N/A  . Years of Education: N/A   Social History Main Topics  . Smoking status: Former Research scientist (life sciences)  . Smokeless tobacco: Never Used  . Alcohol Use: No  . Drug Use: No  . Sexual Activity: None   Other Topics Concern  . None   Social History Narrative   1 brother 1 sister.  Pt. Lives in [place] with his mother.  denies any EtOH use tobacco or illicit drug use. Remains active.  Is on disability secondary to neck fracture.   Past Surgical History  Procedure Laterality Date  . Cervical fusion    . Hip arthroplasty  08/12/2011    Procedure: ARTHROPLASTY BIPOLAR HIP;  Surgeon: Mauri Pole, MD;  Location: [facility];  Service: Orthopedics;  Laterality: Left;  Hemi-arthroplasty   Past Medical History  Diagnosis Date  . Cervical spine fracture     C4-C6 with fusion  . Paraparesis of both lower limbs   . History of recurrent UTIs   . Erectile dysfunction    BP 133/71  Pulse 78  Resp 14  Ht 6\' 3"  (1.905 m)  Wt 176 lb (79.833 kg)  BMI 22.00 kg/m2  SpO2 96%  Opioid Risk Score:   Fall Risk Score: Low Fall Risk (0-5  points)   Review of Systems     Objective:   Physical Exam        Assessment & Plan:

## 2013-12-08 ENCOUNTER — Encounter: Payer: Commercial Managed Care - HMO | Admitting: Physical Therapy

## 2013-12-13 ENCOUNTER — Other Ambulatory Visit: Payer: Self-pay | Admitting: Urology

## 2013-12-26 ENCOUNTER — Encounter (HOSPITAL_BASED_OUTPATIENT_CLINIC_OR_DEPARTMENT_OTHER): Payer: Self-pay | Admitting: *Deleted

## 2013-12-26 NOTE — H&P (Signed)
History of Present Illness   Mr Andrew Sullivan has a stable neurogenic bladder. He has ongoing blood in his urine. My assistant talked to him, he had bloody urine and a very spastic bladder limiting the examination. He still had microscopic hematuria. He has just finished Septra. At his request, I gave him Macrodantin. He is here today with a CT scan. We talked about cystoscoping him under anesthesia or if things settle down in the future to clear his bladder. More Cialis samples were given for stable erectile dysfunction.  Review of systems: No change in bowel or neurologic systems.   Mr Andrew Sullivan still has blood in his urine. His urine culture again was negative. He has been on a number of antibiotics.   He understands his CT scan looked good, though there was bladder artifact from his hip.    Surgical History Problems  1. Denied: History of Neck Surgery  Current Meds 1. Baclofen 20 MG Oral Tablet;  Therapy: [id number] to Recorded 2. Ciprofloxacin HCl - 250 MG Oral Tablet; take 1 tablet by mouth twice a day;  Therapy: [id number] to (Evaluate:12Jun2015)  Requested for: [phone]; Last  Rx:05Jun2015 Ordered 3. Meloxicam 15 MG Oral Tablet; one tablet daily for 2 weeks;  Therapy: 12XNT7001 to (Last Rx:19May2015)  Requested for: [id number] Ordered 4. Nitrofurantoin Macrocrystal 100 MG Oral Capsule; Take 1 capsule twice daily;  Therapy: 17Sep2015 to (Last Rx:17Sep2015)  Requested for: 30Sep2015; Status:  ACTIVE - Renewal Denied Ordered 5. SMZ-TMP DS 800-160 MG Oral Tablet; take 1 tablet bid for 7 days;  Therapy: 02Sep2015 to (Last Rx:02Sep2015)  Requested for: 02Sep2015 Ordered 6. TraMADol HCl - 50 MG Oral Tablet;  Therapy: 91MBW4665 to Recorded  Allergies Medication  1. Cipro TABS  Social History Problems  1. Caffeine Use   Tea 2. Marital History - Single 3. Tobacco Use   PPD for six months.  Quit 1988  Assessment Assessed  1. Gross hematuria (R31.0)  Plan Gross hematuria  1. AU  CT-HEMATURIA PROTOCOL; Status:In Progress - Specimen/Data Collected;   Done:  [id number] 12:00AM  Discussion/Summary   I will call Mr Andrew Sullivan if the report differs. Otherwise, he consented to a cystoscopy under anesthesia and biopsy or transverse resection of a bladder tumor if found. He smoked in 1988. I am hoping all the cystoscopy findings are negative. Sequelae of biopsy, including perforation and management were discussed. Use of the Foley catheter afterwards was also discussed.   After a thorough review of the management options for the patient's condition the patient  elected to proceed with surgical therapy as noted above. We have discussed the potential benefits and risks of the procedure, side effects of the proposed treatment, the likelihood of the patient achieving the goals of the procedure, and any potential problems that might occur during the procedure or recuperation. Informed consent has been obtained.

## 2013-12-26 NOTE — Progress Notes (Signed)
NPO AFTER MN. ARRIVE AT 0730. NEEDS CBC AND BMET AM DOS W/ SIPS OF WATER. MAY TAKE BACLOFEN AM DOS W/ SIPS OF WATER. PT IS W/C BOUND BUT VERY CAPABLE OF MOVING SELF.

## 2013-12-27 ENCOUNTER — Ambulatory Visit (HOSPITAL_BASED_OUTPATIENT_CLINIC_OR_DEPARTMENT_OTHER): Payer: Medicare HMO | Admitting: Anesthesiology

## 2013-12-27 ENCOUNTER — Encounter (HOSPITAL_BASED_OUTPATIENT_CLINIC_OR_DEPARTMENT_OTHER)
Admission: RE | Disposition: A | Discharge status: Home / Self Care | Payer: Self-pay | Source: Ambulatory Visit | Attending: Urology

## 2013-12-27 ENCOUNTER — Ambulatory Visit (HOSPITAL_COMMUNITY): Payer: Medicare HMO

## 2013-12-27 ENCOUNTER — Encounter (HOSPITAL_BASED_OUTPATIENT_CLINIC_OR_DEPARTMENT_OTHER): Payer: Medicare HMO | Admitting: Anesthesiology

## 2013-12-27 ENCOUNTER — Ambulatory Visit (HOSPITAL_BASED_OUTPATIENT_CLINIC_OR_DEPARTMENT_OTHER)
Admission: RE | Admit: 2013-12-27 | Discharge: 2013-12-27 | Disposition: A | Discharge status: Home / Self Care | Payer: Medicare HMO | Source: Ambulatory Visit | Attending: Urology | Admitting: Urology

## 2013-12-27 ENCOUNTER — Encounter (HOSPITAL_BASED_OUTPATIENT_CLINIC_OR_DEPARTMENT_OTHER): Payer: Self-pay

## 2013-12-27 DIAGNOSIS — Z87891 Personal history of nicotine dependence: Secondary | ICD-10-CM | POA: Diagnosis not present

## 2013-12-27 DIAGNOSIS — Z79899 Other long term (current) drug therapy: Secondary | ICD-10-CM | POA: Diagnosis not present

## 2013-12-27 DIAGNOSIS — N319 Neuromuscular dysfunction of bladder, unspecified: Secondary | ICD-10-CM | POA: Insufficient documentation

## 2013-12-27 DIAGNOSIS — Z87828 Personal history of other (healed) physical injury and trauma: Secondary | ICD-10-CM | POA: Insufficient documentation

## 2013-12-27 DIAGNOSIS — Z881 Allergy status to other antibiotic agents status: Secondary | ICD-10-CM | POA: Insufficient documentation

## 2013-12-27 DIAGNOSIS — R310 Gross hematuria: Secondary | ICD-10-CM | POA: Insufficient documentation

## 2013-12-27 HISTORY — DX: Personal history of other (healed) physical injury and trauma: Z87.828

## 2013-12-27 HISTORY — DX: Gross hematuria: R31.0

## 2013-12-27 HISTORY — PX: CYSTOSCOPY WITH RETROGRADE PYELOGRAM, URETEROSCOPY AND STENT PLACEMENT: SHX5789

## 2013-12-27 HISTORY — DX: Quadriplegia, unspecified: G82.50

## 2013-12-27 LAB — POCT I-STAT, CHEM 8
BUN: 31 mg/dL — ABNORMAL HIGH (ref 6–23)
Calcium, Ion: 1.17 mmol/L (ref 1.12–1.23)
Chloride: 107 mEq/L (ref 96–112)
Creatinine, Ser: 0.9 mg/dL (ref 0.50–1.35)
Glucose, Bld: 86 mg/dL (ref 70–99)
HEMATOCRIT: 41 % (ref 39.0–52.0)
HEMOGLOBIN: 13.9 g/dL (ref 13.0–17.0)
POTASSIUM: 6 meq/L — AB (ref 3.7–5.3)
Sodium: 140 mEq/L (ref 137–147)
TCO2: 28 mmol/L (ref 0–100)

## 2013-12-27 LAB — CBC
HCT: 39.5 % (ref 39.0–52.0)
Hemoglobin: 13.3 g/dL (ref 13.0–17.0)
MCH: 30.7 pg (ref 26.0–34.0)
MCHC: 33.7 g/dL (ref 30.0–36.0)
MCV: 91.2 fL (ref 78.0–100.0)
Platelets: 179 10*3/uL (ref 150–400)
RBC: 4.33 MIL/uL (ref 4.22–5.81)
RDW: 12.2 % (ref 11.5–15.5)
WBC: 2.9 10*3/uL — ABNORMAL LOW (ref 4.0–10.5)

## 2013-12-27 SURGERY — CYSTOURETEROSCOPY, WITH RETROGRADE PYELOGRAM AND STENT INSERTION
Anesthesia: General | Site: Ureter | Laterality: Left

## 2013-12-27 MED ORDER — GENTAMICIN SULFATE 40 MG/ML IJ SOLN
400.0000 mg | Freq: Once | INTRAVENOUS | Status: AC
  Administered 2013-12-27: 400 mg via INTRAVENOUS
  Filled 2013-12-27: qty 10

## 2013-12-27 MED ORDER — MIDAZOLAM HCL 5 MG/5ML IJ SOLN
INTRAMUSCULAR | Status: DC | PRN
  Administered 2013-12-27 (×2): 1 mg via INTRAVENOUS

## 2013-12-27 MED ORDER — DEXAMETHASONE SODIUM PHOSPHATE 4 MG/ML IJ SOLN
INTRAMUSCULAR | Status: DC | PRN
  Administered 2013-12-27: 8 mg via INTRAVENOUS

## 2013-12-27 MED ORDER — IOHEXOL 350 MG/ML SOLN
INTRAVENOUS | Status: DC | PRN
  Administered 2013-12-27: 6 mL via URETHRAL

## 2013-12-27 MED ORDER — LACTATED RINGERS IV SOLN
INTRAVENOUS | Status: DC
  Administered 2013-12-27 (×2): via INTRAVENOUS
  Filled 2013-12-27: qty 1000

## 2013-12-27 MED ORDER — PROPOFOL 10 MG/ML IV BOLUS
INTRAVENOUS | Status: DC | PRN
  Administered 2013-12-27: 200 mg via INTRAVENOUS

## 2013-12-27 MED ORDER — FENTANYL CITRATE 0.05 MG/ML IJ SOLN
INTRAMUSCULAR | Status: AC
  Filled 2013-12-27: qty 4

## 2013-12-27 MED ORDER — ONDANSETRON HCL 4 MG/2ML IJ SOLN
INTRAMUSCULAR | Status: DC | PRN
  Administered 2013-12-27: 4 mg via INTRAVENOUS

## 2013-12-27 MED ORDER — STERILE WATER FOR IRRIGATION IR SOLN
Status: DC | PRN
  Administered 2013-12-27: 3000 mL

## 2013-12-27 MED ORDER — MIDAZOLAM HCL 2 MG/2ML IJ SOLN
INTRAMUSCULAR | Status: AC
  Filled 2013-12-27: qty 2

## 2013-12-27 MED ORDER — LIDOCAINE HCL (CARDIAC) 20 MG/ML IV SOLN
INTRAVENOUS | Status: DC | PRN
  Administered 2013-12-27: 50 mg via INTRAVENOUS

## 2013-12-27 MED ORDER — 0.9 % SODIUM CHLORIDE (POUR BTL) OPTIME
TOPICAL | Status: DC | PRN
  Administered 2013-12-27: 1000 mL

## 2013-12-27 MED ORDER — FENTANYL CITRATE 0.05 MG/ML IJ SOLN
25.0000 ug | INTRAMUSCULAR | Status: DC | PRN
  Filled 2013-12-27: qty 1

## 2013-12-27 MED ORDER — SODIUM CHLORIDE 0.9 % IR SOLN
Status: DC | PRN
  Administered 2013-12-27: 3000 mL

## 2013-12-27 MED ORDER — PROMETHAZINE HCL 25 MG/ML IJ SOLN
6.2500 mg | INTRAMUSCULAR | Status: DC | PRN
  Filled 2013-12-27: qty 1

## 2013-12-27 MED ORDER — GENTAMICIN IN SALINE 1.6-0.9 MG/ML-% IV SOLN
80.0000 mg | INTRAVENOUS | Status: DC
  Filled 2013-12-27: qty 50

## 2013-12-27 MED ORDER — FENTANYL CITRATE 0.05 MG/ML IJ SOLN
INTRAMUSCULAR | Status: DC | PRN
  Administered 2013-12-27: 50 ug via INTRAVENOUS
  Administered 2013-12-27 (×2): 25 ug via INTRAVENOUS

## 2013-12-27 SURGICAL SUPPLY — 25 items
BAG DRAIN URO-CYSTO SKYTR STRL (DRAIN) ×4 IMPLANT
BAG DRN UROCATH (DRAIN) ×2
CANISTER SUCT LVC 12 LTR MEDI- (MISCELLANEOUS) ×3 IMPLANT
CATH ROBINSON RED A/P 12FR (CATHETERS) IMPLANT
CATH ROBINSON RED A/P 14FR (CATHETERS) IMPLANT
CATH ROBINSON RED A/P 16FR (CATHETERS) ×3 IMPLANT
CLOTH BEACON ORANGE TIMEOUT ST (SAFETY) ×4 IMPLANT
DRAPE CAMERA CLOSED 9X96 (DRAPES) ×4 IMPLANT
ELECT REM PT RETURN 9FT ADLT (ELECTROSURGICAL) ×4
ELECTRODE REM PT RTRN 9FT ADLT (ELECTROSURGICAL) ×1 IMPLANT
GLOVE BIO SURGEON STRL SZ7.5 (GLOVE) ×13 IMPLANT
GLOVE BIOGEL M 6.5 STRL (GLOVE) ×3 IMPLANT
GLOVE INDICATOR 6.5 STRL GRN (GLOVE) ×3 IMPLANT
GOWN STRL REIN XL XLG (GOWN DISPOSABLE) ×1 IMPLANT
GOWN STRL REUS W/TWL LRG LVL3 (GOWN DISPOSABLE) ×8 IMPLANT
GOWN STRL REUS W/TWL XL LVL3 (GOWN DISPOSABLE) ×6 IMPLANT
GUIDEWIRE STR DUAL SENSOR (WIRE) ×6 IMPLANT
IV NS IRRIG 3000ML ARTHROMATIC (IV SOLUTION) ×6 IMPLANT
NDL SAFETY ECLIPSE 18X1.5 (NEEDLE) ×2 IMPLANT
NEEDLE HYPO 18GX1.5 SHARP (NEEDLE) ×4
NS IRRIG 500ML POUR BTL (IV SOLUTION) ×4 IMPLANT
PACK CYSTO (CUSTOM PROCEDURE TRAY) ×4 IMPLANT
STENT URET 6FRX26 CONTOUR (STENTS) ×3 IMPLANT
SYR 20CC LL (SYRINGE) ×1 IMPLANT
WATER STERILE IRR 3000ML UROMA (IV SOLUTION) ×4 IMPLANT

## 2013-12-27 NOTE — Transfer of Care (Signed)
Immediate Anesthesia Transfer of Care Note  Patient: Andrew Sullivan  Procedure(s) Performed: Procedure(s) (LRB): CYSTOSCOPY WITH RETROGRADE PYELOGRAM, URETEROSCOPY AND STENT PLACEMENT (Left)  Patient Location: PACU  Anesthesia Type: General  Level of Consciousness: awake, alert  and oriented  Airway & Oxygen Therapy: Patient Spontanous Breathing and Patient connected to face mask oxygen  Post-op Assessment: Report given to PACU RN and Post -op Vital signs reviewed and stable  Post vital signs: Reviewed and stable  Complications: No apparent anesthesia complications

## 2013-12-27 NOTE — Discharge Instructions (Signed)
1. You may see some blood in the urine and may have some burning with urination for 48-72 hours. You also may notice that you have to urinate more frequently or urgently after your procedure which is normal.  2. You should call should you develop an inability urinate, fever > 101, persistent nausea and vomiting that prevents you from eating or drinking to stay hydrated.  3. You have a stent. you will likely urinate more frequently and urgently until the stent is removed and you may experience some discomfort/pain in the lower abdomen and flank especially when urinating. You may take pain medication prescribed to you if needed for pain. You may also intermittently have blood in the urine until the stent is removed. It is essential that you follow up for stent removal as instructed. If the stent is left in place, this could result in permanent damage to and even loss of the kidney, as well as other complications like infections and kidney stones.   Resume taking the antibiotics you were on prior to surgery.   CYSTOSCOPY HOME CARE INSTRUCTIONS  Activity: Rest for the remainder of the day.  Do not drive or operate equipment today.  You may resume normal activities in one to two days as instructed by your physician.   Meals: Drink plenty of liquids and eat light foods such as gelatin or soup this evening.  You may return to a normal meal plan tomorrow.  Return to Work: You may return to work in one to two days or as instructed by your physician.  Special Instructions / Symptoms: Call your physician if any of these symptoms occur:   -persistent or heavy bleeding  -bleeding which continues after first few urination  -large blood clots that are difficult to pass  -urine stream diminishes or stops completely  -fever equal to or higher than 101 degrees Farenheit.  -cloudy urine with a strong, foul odor  -severe pain  Females should always wipe from front to back after elimination.  You may feel  some burning pain when you urinate.  This should disappear with time.  Applying moist heat to the lower abdomen or a hot tub bath may help relieve the pain. \  Follow-Up / Date of Return Visit to Your Physician:  *** Call for an appointment to arrange follow-up.  Patient Signature:  ________________________________________________________  Nurse's Signature:  ________________________________________________________ CYSTOSCOPY HOME CARE INSTRUCTIONS  Activity: Rest for the remainder of the day.  Do not drive or operate equipment today.  You may resume normal activities in one to two days as instructed by your physician.   Meals: Drink plenty of liquids and eat light foods such as gelatin or soup this evening.  You may return to a normal meal plan tomorrow.  Return to Work: You may return to work in one to two days or as instructed by your physician.  Special Instructions / Symptoms: Call your physician if any of these symptoms occur:   -persistent or heavy bleeding  -bleeding which continues after first few urination  -large blood clots that are difficult to pass  -urine stream diminishes or stops completely  -fever equal to or higher than 101 degrees Farenheit.  -cloudy urine with a strong, foul odor  -severe pain  Females should always wipe from front to back after elimination.  You may feel some burning pain when you urinate.  This should disappear with time.  Applying moist heat to the lower abdomen or a hot tub bath may help  relieve the pain. \  Follow-Up / Date of Return Visit to Your Physician:  *** Call for an appointment to arrange follow-up.  Patient Signature:  ________________________________________________________  Nurse's Signature:  ________________________________________________________ CYSTOSCOPY HOME CARE INSTRUCTIONS  Activity: Rest for the remainder of the day.  Do not drive or operate equipment today.  You may resume normal activities in one to two days  as instructed by your physician.   Meals: Drink plenty of liquids and eat light foods such as gelatin or soup this evening.  You may return to a normal meal plan tomorrow.  Return to Work: You may return to work in one to two days or as instructed by your physician.  Special Instructions / Symptoms: Call your physician if any of these symptoms occur:   -persistent or heavy bleeding  -bleeding which continues after first few urination  -large blood clots that are difficult to pass  -urine stream diminishes or stops completely  -fever equal to or higher than 101 degrees Farenheit.  -cloudy urine with a strong, foul odor  -severe pain  Females should always wipe from front to back after elimination.  You may feel some burning pain when you urinate.  This should disappear with time.  Applying moist heat to the lower abdomen or a hot tub bath may help relieve the pain. \  Follow-Up / Date of Return Visit to Your Physician:  *** Call for an appointment to arrange follow-up.  Patient Signature:  ________________________________________________________  Nurse's Signature:  ________________________________________________________    Post Anesthesia Home Care Instructions  Activity: Get plenty of rest for the remainder of the day. A responsible adult should stay with you for 24 hours following the procedure.  For the next 24 hours, DO NOT: -Drive a car -Paediatric nurse -Drink alcoholic beverages -Take any medication unless instructed by your physician -Make any legal decisions or sign important papers.  Meals: Start with liquid foods such as gelatin or soup. Progress to regular foods as tolerated. Avoid greasy, spicy, heavy foods. If nausea and/or vomiting occur, drink only clear liquids until the nausea and/or vomiting subsides. Call your physician if vomiting continues.  Special Instructions/Symptoms: Your throat may feel dry or sore from the anesthesia or the breathing tube  placed in your throat during surgery. If this causes discomfort, gargle with warm salt water. The discomfort should disappear within 24 hours.

## 2013-12-27 NOTE — Op Note (Signed)
Preoperative diagnosis:  1. Gross hematuria  Postoperative diagnosis: 1. Same  Procedure(s): 1. Cystourethroscopy 2. Left selective cytology 3. Left retrograde pyelogram 4. Left ureteral stent placement  Surgeon: Dr. Nicki Reaper MacDiarmid  Anesthesia: General  Complications: None  EBL: None  Specimens: Left ureteral cytology  Disposition of specimens: Pathology  Intraoperative findings: No bladder masses, lesions, or stones. The mucosa of the medial trigone had prominent punctate vasculature but no obvious source of bleeding was noted. There was a small amount of bloody efflux from the left ureter. Ureteral washings for cytology revealed rust-colored drip when in the renal pelvis. Right ureter with clear efflux. Left retrograde pyelogram was normal without filling defects, evidence of stones or ureteral/renal pelvic lesions. The left ureteral orifice could not be accessed by the ureteroscope so a stent was left in place.  Indication: Gross hematuria. Normal multi-phasic hematuria protocol CT. Unable to get adequate visualization in office with flexible cystoscopy due to spastic bladder and gross hematuria.  Description of procedure:  The patient, consent and site were verified prior to bringing them to the OR where they were placed supine on the OR table. SCDs were placed and IV antibiotics were initiated. General anesthesia was induced and the patient was placed in the dorsal lithotomy position where they were prepped and draped in the usual sterile fashion. Pre-procedure time out was called and the case was begun.  The 17Fr rigid cystoscope with 30 degree lens was inserted into the bladder per urethra with ample lubrication and normal saline running for irrigation. Complete cystoscopy was performed with the above noted findings.  The flexible cystoscope was obtained and retroflexed, confirming normal bladder neck mucosa and no hidden lesions.   After some time, we noted bloody  efflux from the left ureteral orifice. Using a 5Fr open ended catheter, selective cytology from the renal pelvis was obtained with the above noted findings.   After a retrograde pyelogram that was normal, we attempted to pass the flexible ureteroscope over a wire and then beside the wire but were unsuccessful due to a narrow UO. We elected to place a stent and return for diagnostic ureteroscopy at a later date.   The patient was then awoken from general anesthesia having tolerated the procedure well.

## 2013-12-27 NOTE — Interval H&P Note (Signed)
History and Physical Interval Note:  12/27/2013 7:41 AM  Andrew Sullivan  has presented today for surgery, with the diagnosis of Gross Hematuria  The various methods of treatment have been discussed with the patient and family. After consideration of risks, benefits and other options for treatment, the patient has consented to  Procedure(s): CYSTOSCOPY WITH POSSIBLE BIOPSY  (N/A) as a surgical intervention .  The patient's history has been reviewed, patient examined, no change in status, stable for surgery.  I have reviewed the patient's chart and labs.  Questions were answered to the patient's satisfaction.     [name] A

## 2013-12-27 NOTE — Anesthesia Preprocedure Evaluation (Addendum)
Anesthesia Evaluation  Patient identified by MRN, date of birth, ID band Patient awake    Reviewed: Allergy & Precautions, H&P , NPO status , Patient's Chart, lab work & pertinent test results  Airway Mallampati: II TM Distance: >3 FB Neck ROM: Full    Dental no notable dental hx. (+) Teeth Intact, Dental Advisory Given   Pulmonary neg pulmonary ROS, former smoker,  breath sounds clear to auscultation  Pulmonary exam normal       Cardiovascular negative cardio ROS  Rhythm:Regular Rate:Normal     Neuro/Psych History of spinal cord injury   85 (age 46 fell from ladder) w/ cervical spine fx  s/p  c4 -- c6  fusion--  residual paraplegic bilateral legs (great below T8 uses w/c and can transfer self  negative psych ROS   GI/Hepatic negative GI ROS, Neg liver ROS,   Endo/Other  negative endocrine ROS  Renal/GU negative Renal ROS  negative genitourinary   Musculoskeletal negative musculoskeletal ROS (+)   Abdominal   Peds negative pediatric ROS (+)  Hematology negative hematology ROS (+)   Anesthesia Other Findings   Reproductive/Obstetrics negative OB ROS                         Anesthesia Physical Anesthesia Plan  ASA: III  Anesthesia Plan: General   Post-op Pain Management:    Induction: Intravenous  Airway Management Planned: LMA  Additional Equipment:   Intra-op Plan:   Post-operative Plan:   Informed Consent: I have reviewed the patients History and Physical, chart, labs and discussed the procedure including the risks, benefits and alternatives for the proposed anesthesia with the patient or authorized representative who has indicated his/her understanding and acceptance.   Dental advisory given  Plan Discussed with: CRNA and Surgeon  Anesthesia Plan Comments:         Anesthesia Quick Evaluation

## 2013-12-27 NOTE — Anesthesia Procedure Notes (Signed)
Procedure Name: LMA Insertion Date/Time: 12/27/2013 9:33 AM Performed by: Mechele Claude Pre-anesthesia Checklist: Patient identified, Emergency Drugs available, Suction available and Patient being monitored Patient Re-evaluated:Patient Re-evaluated prior to inductionOxygen Delivery Method: Circle System Utilized Preoxygenation: Pre-oxygenation with 100% oxygen Intubation Type: IV induction Ventilation: Mask ventilation without difficulty LMA: LMA inserted LMA Size: 5.0 Number of attempts: 1 Airway Equipment and Method: bite block Placement Confirmation: positive ETCO2 Tube secured with: Tape Dental Injury: Teeth and Oropharynx as per pre-operative assessment

## 2013-12-27 NOTE — Anesthesia Postprocedure Evaluation (Signed)
  Anesthesia Post-op Note  Patient: Andrew Sullivan  Procedure(s) Performed: Procedure(s) (LRB): CYSTOSCOPY WITH RETROGRADE PYELOGRAM, URETEROSCOPY AND STENT PLACEMENT (Left)  Patient Location: PACU  Anesthesia Type: General  Level of Consciousness: awake and alert   Airway and Oxygen Therapy: Patient Spontanous Breathing  Post-op Pain: mild  Post-op Assessment: Post-op Vital signs reviewed, Patient's Cardiovascular Status Stable, Respiratory Function Stable, Patent Airway and No signs of Nausea or vomiting  Last Vitals:  Filed Vitals:   12/27/13 1200  BP: 145/95  Pulse: 45  Temp:   Resp: 18    Post-op Vital Signs: stable   Complications: No apparent anesthesia complications

## 2013-12-29 ENCOUNTER — Encounter (HOSPITAL_BASED_OUTPATIENT_CLINIC_OR_DEPARTMENT_OTHER): Payer: Self-pay | Admitting: Urology

## 2014-01-02 ENCOUNTER — Other Ambulatory Visit: Payer: Self-pay | Admitting: Urology

## 2014-01-03 ENCOUNTER — Encounter (HOSPITAL_COMMUNITY): Payer: Self-pay | Admitting: *Deleted

## 2014-01-08 MED ORDER — GENTAMICIN SULFATE 40 MG/ML IJ SOLN
5.0000 mg/kg | INTRAMUSCULAR | Status: AC
  Administered 2014-01-09: 380 mg via INTRAVENOUS
  Filled 2014-01-08: qty 9.5

## 2014-01-09 ENCOUNTER — Ambulatory Visit (HOSPITAL_COMMUNITY)
Admission: RE | Admit: 2014-01-09 | Discharge: 2014-01-09 | Disposition: A | Discharge status: Home / Self Care | Payer: Medicare HMO | Source: Ambulatory Visit | Attending: Urology | Admitting: Urology

## 2014-01-09 ENCOUNTER — Ambulatory Visit (HOSPITAL_COMMUNITY): Payer: Medicare HMO | Admitting: Anesthesiology

## 2014-01-09 ENCOUNTER — Encounter (HOSPITAL_COMMUNITY): Payer: Self-pay | Admitting: *Deleted

## 2014-01-09 ENCOUNTER — Encounter (HOSPITAL_COMMUNITY)
Admission: RE | Disposition: A | Discharge status: Home / Self Care | Payer: Self-pay | Source: Ambulatory Visit | Attending: Urology

## 2014-01-09 DIAGNOSIS — Z87891 Personal history of nicotine dependence: Secondary | ICD-10-CM | POA: Insufficient documentation

## 2014-01-09 DIAGNOSIS — G8250 Quadriplegia, unspecified: Secondary | ICD-10-CM | POA: Insufficient documentation

## 2014-01-09 DIAGNOSIS — N319 Neuromuscular dysfunction of bladder, unspecified: Secondary | ICD-10-CM | POA: Diagnosis not present

## 2014-01-09 DIAGNOSIS — R310 Gross hematuria: Secondary | ICD-10-CM | POA: Diagnosis present

## 2014-01-09 DIAGNOSIS — Z880 Allergy status to penicillin: Secondary | ICD-10-CM | POA: Insufficient documentation

## 2014-01-09 DIAGNOSIS — N529 Male erectile dysfunction, unspecified: Secondary | ICD-10-CM | POA: Diagnosis not present

## 2014-01-09 HISTORY — PX: CYSTOSCOPY/RETROGRADE/URETEROSCOPY: SHX5316

## 2014-01-09 LAB — BASIC METABOLIC PANEL
Anion gap: 9 (ref 5–15)
BUN: 16 mg/dL (ref 6–23)
CO2: 28 mEq/L (ref 19–32)
Calcium: 8.7 mg/dL (ref 8.4–10.5)
Chloride: 106 mEq/L (ref 96–112)
Creatinine, Ser: 0.96 mg/dL (ref 0.50–1.35)
GFR calc Af Amer: >90 mL/min (ref >90)
GFR calc non Af Amer: >90 mL/min (ref >90)
GLUCOSE: 88 mg/dL (ref 70–99)
POTASSIUM: 4.3 meq/L (ref 3.7–5.3)
Sodium: 143 mEq/L (ref 137–147)

## 2014-01-09 SURGERY — CYSTOSCOPY/RETROGRADE/URETEROSCOPY
Anesthesia: General | Laterality: Left

## 2014-01-09 MED ORDER — PROPOFOL 10 MG/ML IV BOLUS
INTRAVENOUS | Status: DC | PRN
  Administered 2014-01-09: 200 mg via INTRAVENOUS

## 2014-01-09 MED ORDER — PROPOFOL 10 MG/ML IV BOLUS
INTRAVENOUS | Status: AC
  Filled 2014-01-09: qty 20

## 2014-01-09 MED ORDER — IOHEXOL 300 MG/ML  SOLN
INTRAMUSCULAR | Status: DC | PRN
  Administered 2014-01-09: 16 mL

## 2014-01-09 MED ORDER — OXYCODONE-ACETAMINOPHEN 5-325 MG PO TABS: 1.0000 | ORAL_TABLET | ORAL | Status: DC | PRN

## 2014-01-09 MED ORDER — FENTANYL CITRATE 0.05 MG/ML IJ SOLN: 25.0000 ug | INTRAMUSCULAR | Status: DC | PRN

## 2014-01-09 MED ORDER — FENTANYL CITRATE 0.05 MG/ML IJ SOLN
INTRAMUSCULAR | Status: AC
  Filled 2014-01-09: qty 2

## 2014-01-09 MED ORDER — SULFAMETHOXAZOLE-TRIMETHOPRIM 800-160 MG PO TABS: 1.0000 | ORAL_TABLET | Freq: Every day | ORAL | Status: DC

## 2014-01-09 MED ORDER — LACTATED RINGERS IV SOLN
INTRAVENOUS | Status: DC
  Administered 2014-01-09: 14:00:00 via INTRAVENOUS
  Administered 2014-01-09: 1000 mL via INTRAVENOUS

## 2014-01-09 MED ORDER — SODIUM CHLORIDE 0.9 % IR SOLN
Status: DC | PRN
  Administered 2014-01-09: 4000 mL via INTRAVESICAL

## 2014-01-09 MED ORDER — SENNOSIDES-DOCUSATE SODIUM 8.6-50 MG PO TABS: 1.0000 | ORAL_TABLET | Freq: Two times a day (BID) | ORAL | Status: DC

## 2014-01-09 MED ORDER — PHENYLEPHRINE HCL 10 MG/ML IJ SOLN
INTRAMUSCULAR | Status: DC | PRN
  Administered 2014-01-09 (×2): 120 ug via INTRAVENOUS

## 2014-01-09 MED ORDER — MIDAZOLAM HCL 5 MG/5ML IJ SOLN
INTRAMUSCULAR | Status: DC | PRN
  Administered 2014-01-09: 2 mg via INTRAVENOUS

## 2014-01-09 MED ORDER — MIDAZOLAM HCL 2 MG/2ML IJ SOLN
INTRAMUSCULAR | Status: AC
  Filled 2014-01-09: qty 2

## 2014-01-09 MED ORDER — PHENYLEPHRINE 40 MCG/ML (10ML) SYRINGE FOR IV PUSH (FOR BLOOD PRESSURE SUPPORT)
PREFILLED_SYRINGE | INTRAVENOUS | Status: AC
  Filled 2014-01-09: qty 10

## 2014-01-09 SURGICAL SUPPLY — 17 items
BAG URO CATCHER STRL LF (DRAPE) ×4 IMPLANT
BASKET LASER NITINOL 1.9FR (BASKET) IMPLANT
BSKT STON RTRVL 120 1.9FR (BASKET)
CATH INTERMIT  6FR 70CM (CATHETERS) IMPLANT
DRAPE CAMERA CLOSED 9X96 (DRAPES) ×4 IMPLANT
FORCEPS BIOP 2.4F 115CM BACKLD (INSTRUMENTS) ×3 IMPLANT
GLOVE BIOGEL M STRL SZ7.5 (GLOVE) ×4 IMPLANT
GOWN STRL REUS W/TWL LRG LVL3 (GOWN DISPOSABLE) ×4 IMPLANT
GUIDEWIRE ANG ZIPWIRE 038X150 (WIRE) ×4 IMPLANT
GUIDEWIRE STR DUAL SENSOR (WIRE) ×4 IMPLANT
MANIFOLD NEPTUNE II (INSTRUMENTS) ×4 IMPLANT
PACK CYSTO (CUSTOM PROCEDURE TRAY) ×4 IMPLANT
SHEATH ACCESS URETERAL 38CM (SHEATH) ×3 IMPLANT
STENT CONTOUR 6FRX26X.038 (STENTS) ×3 IMPLANT
TUBE FEEDING 8FR 16IN STR KANG (MISCELLANEOUS) ×4 IMPLANT
TUBING CONNECTING 10 (TUBING) ×3 IMPLANT
TUBING CONNECTING 10' (TUBING) ×1

## 2014-01-09 NOTE — Anesthesia Postprocedure Evaluation (Signed)
  Anesthesia Post-op Note  Patient: Andrew Sullivan  Procedure(s) Performed: Procedure(s): CYSTOSCOPY/RETROGRADE/URETEROSCOPY, URETERAL BIOPSY AND STENT EXCHANGE (Left)  Patient Location: PACU  Anesthesia Type:General  Level of Consciousness: awake  Airway and Oxygen Therapy: Patient Spontanous Breathing  Post-op Pain: none  Post-op Assessment: Post-op Vital signs reviewed, Patient's Cardiovascular Status Stable, Respiratory Function Stable, Patent Airway, No signs of Nausea or vomiting and Pain level controlled  Post-op Vital Signs: Reviewed and stable  Last Vitals:  Filed Vitals:   01/09/14 1516  BP: 151/76  Pulse: 43  Temp: 36.5 C  Resp: 16    Complications: No apparent anesthesia complications

## 2014-01-09 NOTE — Brief Op Note (Signed)
01/09/2014  1:43 PM  PATIENT:  Margurite Auerbach Littrell  43 y.o. male  PRE-OPERATIVE DIAGNOSIS:  HEMATURIA  POST-OPERATIVE DIAGNOSIS:  HEMATURIA  PROCEDURE:  Procedure(s): CYSTOSCOPY/RETROGRADE/URETEROSCOPY, URETERAL BIOPSY AND STENT EXCHANGE (Left)  SURGEON:  Surgeon(s) and Role:    * Alexis Frock, MD - Primary  PHYSICIAN ASSISTANT:   ASSISTANTS: none   ANESTHESIA:   general  EBL:  Total I/O In: 1000 [I.V.:1000] Out: -   BLOOD ADMINISTERED:none  DRAINS: none   LOCAL MEDICATIONS USED:  NONE  SPECIMEN:  Source of Specimen:  Left renal pelvic papillary tissue  DISPOSITION OF SPECIMEN:  PATHOLOGY  COUNTS:  YES  TOURNIQUET:  * No tourniquets in log *  DICTATION: .Other Dictation: Dictation Number [id number]  PLAN OF CARE: DC from PACU  PATIENT DISPOSITION:  PACU - hemodynamically stable.   Delay start of Pharmacological VTE agent (>24hrs) due to surgical blood loss or risk of bleeding: yes

## 2014-01-09 NOTE — H&P (Signed)
Andrew Sullivan is an 43 y.o. male.    Chief Complaint: Pre-op Left Diagnostic Ureteroscopy / Possible Biopsy  HPI:     1 - Gross Hematuria / Possible Left Renal / Ureteral Lesion - Pt with gross hematuria 2015 with negative UCX prompting eval with CT Urogram (negative) and operativey cysto / retrogrades and stent 12/2013 which revealed likely blood form left ureteral orifiece under direct vision. Left renal washings negative.  Left ureter and renal pelvis unable to be visualized to to relateive left ureteral narrowing therefore stented to allow passive dilation.  2 -  Neurogenic Bladder - Partial paraplegic after C spine injury. Does have sensation and motor function all 4 extremities, just with weakness that prevents normal ambulation. Uses crutches at home but wheelchiar for long distances.  Recent Surveillance: 12/2013 - CT, cyst normal, Cr <1.  3 - Erectile Dysfunction - uses PDE5i prn for help with erections. Libido preserved.  PMH sig for spinal cord injury. No CV disease. No blood thinners. His PCP is Andrew Sullivan with family practice.   Today " Andrew Sullivan " is seen to proceed with cysto, left ureteroscopy. Most recent UCX negative.    Past Medical History  Diagnosis Date  . Paraparesis of both lower limbs     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 18 in 34  . History of recurrent UTIs   . Erectile dysfunction   . Gross hematuria   . History of spinal cord injury     3 (age 10 fell from ladder) w/ cervical spine fx  s/p  c4 -- c6  fusion--  residual paraplegic bilateral legs (great below T8 uses w/c and can transfer self  . Spastic tetraplegia     Past Surgical History  Procedure Laterality Date  . Cervical fusion  1988    C4 -- C6 w/ spinal cord injury  . Hip arthroplasty  08/12/2011    Procedure: ARTHROPLASTY BIPOLAR HIP;  Surgeon: Andrew Pole, MD;  Location: [facility];  Service: Orthopedics;  Laterality: Left;  Hemi-arthroplasty  . Cystoscopy with retrograde pyelogram,  ureteroscopy and stent placement Left 12/27/2013    Procedure: [place], URETEROSCOPY AND STENT PLACEMENT;  Surgeon: Andrew Packer, MD;  Location: [facility];  Service: Urology;  Laterality: Left;    History reviewed. No pertinent family history. Social History:  reports that he quit smoking about 27 years ago. His smoking use included Cigarettes. He has a .5 pack-year smoking history. He has never used smokeless tobacco. He reports that he does not drink alcohol or use illicit drugs.  Allergies:  Allergies  Allergen Reactions  . Penicillins Itching and Rash    No prescriptions prior to admission    No results found for this or any previous visit (from the past 48 hour(s)). No results found.  Review of Systems  Constitutional: Negative.  Negative for fever and chills.  HENT: Negative.   Eyes: Negative.   Respiratory: Negative.   Cardiovascular: Negative.   Gastrointestinal: Negative.   Genitourinary: Negative.   Musculoskeletal: Negative.   Skin: Negative.   Neurological: Negative.   Endo/Heme/Allergies: Negative.     There were no vitals taken for this visit. Physical Exam  Constitutional: He appears well-developed.  HENT:  Head: Normocephalic.  Eyes: Pupils are equal, round, and reactive to light.  Neck: Normal range of motion.  Cardiovascular: Normal rate.   Respiratory: Effort normal.  GI: Soft.  Genitourinary: Penis normal.  Musculoskeletal: Normal range of motion.  Neurological: He is alert.  Skin: Skin is warm.  Psychiatric: He has a normal mood and affect. His behavior is normal. Judgment and thought content normal.     Assessment/Plan    1 - Gross Hematuria / Possible Left Renal / Ureteral Lesion - Agree that history and prior operative eval concerning for possible left upper tract pathology and that repeat attempt left diagnostic ureteroscopy with possible biopsy is warranted. Hopefully present JJ stent  will allow for sufficient passive dilation to allow visualizaion of his left upper tract.   We rediscussed risks including bleeding, infection, damage to kidney / ureter  bladder, rarely loss of kidney. We rediscussed anesthetic risks and rare but serious surgical complications including DVT, PE, MI, and mortality. We specifically reddressed that in 5-10% of cases a staged approach is required with stenting followed by re-attempt ureteroscopy if anatomy unfavorable. The patient voiced understanding and wises to proceed.   2 -  Neurogenic Bladder - Up to date with annual surveillance. Continue current management.   3 - Erectile Dysfunction - continue current regimen.    Araya Roel 01/09/2014, 8:58 AM

## 2014-01-09 NOTE — Progress Notes (Signed)
Dr. Ermalene Postin made aware of patient's heart rate being in the 40s- blood pressures stable

## 2014-01-09 NOTE — Transfer of Care (Signed)
Immediate Anesthesia Transfer of Care Note  Patient: Andrew Sullivan  Procedure(s) Performed: Procedure(s): CYSTOSCOPY/RETROGRADE/URETEROSCOPY, URETERAL BIOPSY AND STENT EXCHANGE (Left)  Patient Location: PACU  Anesthesia Type:General  Level of Consciousness: awake, sedated and patient cooperative  Airway & Oxygen Therapy: Patient Spontanous Breathing and Patient connected to face mask oxygen  Post-op Assessment: Report given to PACU RN and Post -op Vital signs reviewed and stable  Post vital signs: Reviewed and stable  Complications: No apparent anesthesia complications

## 2014-01-09 NOTE — Anesthesia Preprocedure Evaluation (Signed)
Anesthesia Evaluation  Patient identified by MRN, date of birth, ID band Patient awake    Reviewed: Allergy & Precautions, H&P , NPO status , Patient's Chart, lab work & pertinent test results  History of Anesthesia Complications Negative for: history of anesthetic complications  Airway Mallampati: II  TM Distance: >3 FB Neck ROM: Full    Dental  (+) Teeth Intact   Pulmonary neg sleep apnea, neg COPDneg recent URI, former smoker,  breath sounds clear to auscultation        Cardiovascular negative cardio ROS  Rhythm:Regular     Neuro/Psych negative neurological ROS  negative psych ROS   GI/Hepatic negative GI ROS, Neg liver ROS,   Endo/Other  negative endocrine ROS  Renal/GU Renal diseasenegative Renal ROShematuria     Musculoskeletal   Abdominal   Peds  Hematology negative hematology ROS (+)   Anesthesia Other Findings   Reproductive/Obstetrics                             Anesthesia Physical Anesthesia Plan  ASA: II  Anesthesia Plan: General   Post-op Pain Management:    Induction: Intravenous  Airway Management Planned: LMA  Additional Equipment: None  Intra-op Plan:   Post-operative Plan: Extubation in OR  Informed Consent: I have reviewed the patients History and Physical, chart, labs and discussed the procedure including the risks, benefits and alternatives for the proposed anesthesia with the patient or authorized representative who has indicated his/her understanding and acceptance.   Dental advisory given  Plan Discussed with: CRNA and Surgeon  Anesthesia Plan Comments:         Anesthesia Quick Evaluation

## 2014-01-10 ENCOUNTER — Encounter (HOSPITAL_COMMUNITY): Payer: Self-pay | Admitting: Urology

## 2014-01-11 NOTE — Op Note (Signed)
[name], [name] NO.:  [id number]  MEDICAL RECORD NO.:  [id number]  LOCATION:                                 FACILITY:  PHYSICIAN:  Alexis Frock, MD     DATE OF BIRTH:  [birth year]  DATE OF PROCEDURE:  01/09/2014 DATE OF DISCHARGE:  01/09/2014                              OPERATIVE REPORT   DIAGNOSES:  Recurrent gross hematuria, questionable left renal tumor.  PROCEDURE: 1. Cystoscopy with left retrograde pyelogram, interpretation. 2. Exchange of left ureteral stent 6 x 26 with tether to dorsum of     penis. 3. Left ureteroscopy with biopsy.  FINDINGS: 1. Erythematous and papillary tissue at the papillary tip of the upper     pole calyx, worrisome for possible neoplasm versus significant     stent edema. 2. Otherwise, unremarkable left retrograde pyelogram. 3. No additional lesions in the left kidney or ureter or bladder.  SPECIMEN:  Left renal pelvis biopsies for permanent pathology.  INDICATION:  Andrew Sullivan is a pleasant 43 year old gentleman with history of partial paraplegia from incomplete spinal cord injury who manages bladder with spontaneous voiding.  He was found to have a large hematuria and was worked up initially by my colleague, Dr. Vikki Ports. Our office cystoscopy revealed questionable blood from the left ureteral orifice, he therefore underwent attempted diagnostic left ureteroscopy, however, his ureter was quite narrow and tortuous at that time. Therefore he underwent ureteral stenting with plan for stage procedure with interval passive dilation and he was referred for consideration of a second stage procedure.  His records were reviewed and this was discussed with the patient and also felt this was the best way to proceed.  Informed consent was obtained and placed in medical record.  PROCEDURE IN DETAIL:  The patient being Andrew Sullivan was verified. Procedure being left ureteroscopy with possible biopsy was  confirmed. Procedure was carried out.  Time-out was performed.  Intravenous antibiotics were administered.  General LMA anesthesia was introduced. The patient was placed into a low lithotomy position.  Sterile field was created by prepping and draping the patient's penis, perineum, proximal thighs using iodine x3.  Next, cystourethroscopy was performed using a 22-French rigid scope with 12-degree offset lens.  Inspection of the anterior and posterior urethra were unremarkable.  Inspection of urinary bladder revealed no diverticula, calcifications, papular lesions. Distal end of the left stent was seen in situ exiting ureteral orifice. This was grasped, brought to the level of urethral meatus through which a 0.038 Glidewire was advanced at the level of the upper pole.  The stent was then exchanged for a 6-French end-hole catheter and left retrograde pyelogram was obtained.  Left pyelogram demonstrated a single left ureter, single system left kidney.  No filling defects or narrowing noted.  The Glidewire was once again advanced at the level of the upper pole and set aside as a safety wire.  An 8-French feeding tube was placed in urinary bladder for pressure release.  Next, semi-rigid ureteroscopy was performed in the entire length of left ureter alongside a separate Sensor working wire. No mucosal abnormalities or narrowing were found.  They appeared to be excellent passive dilation of the ureter from  the in situ stent.  The semi-rigid ureteroscope was exchanged for a 12/14, 38-cm ureteral access sheath at the level of the proximal ureter.  Next, flexible digital ureteroscopy was performed using 8-French digital ureteroscope, which allowed inspection of the left kidney x3.  There was some papillary and very vascular appearing tissue extending from upper pole calyx x2. There is some worrisome for possible early neoplasm versus just edema from his stent having been in this location.  Given  the patient's history, it was clearly felt that the biopsy this was warranted as such the Bigopsy apparatus was backloaded to the scope and under direct vision cold cup biopsies were taken of this tissue x3, these set aside for permanent pathology.  Repeat inspection revealed excellent hemostasis.  No evidence of perforation.  The sheath was removed under direct vision.  No mucosal abnormalities were found.  As the patient had undergone ureteroscopic biopsy and sheaths placement, it was clearly felt that ureteral stenting was warranted once again to allow healing of the ureter sufficiently.  As such, a new 6 x 26-French Contour type stent was placed using fluoroscopic guidance.  Good proximal and distal deployment were noted.  The feeding tube was removed.  The stent tether was fashioned at the dorsum of the penis.  Procedure was then terminated.  The patient tolerated the procedure well.  There were no immediate periprocedural complications.  The patient was taken to postanesthesia care unit in stable condition.          ______________________________ Alexis Frock, MD     TM/MEDQ  D:  01/09/2014  T:  01/10/2014  Job:  [id number]

## 2014-01-18 ENCOUNTER — Encounter: Payer: Self-pay | Admitting: Physical Medicine & Rehabilitation

## 2014-01-18 ENCOUNTER — Encounter
Payer: Commercial Managed Care - HMO | Attending: Physical Medicine & Rehabilitation | Admitting: Physical Medicine & Rehabilitation

## 2014-01-18 VITALS — BP 97/59 | HR 76 | Resp 14 | Ht 73.0 in | Wt 173.0 lb

## 2014-01-18 DIAGNOSIS — S129XXS Fracture of neck, unspecified, sequela: Secondary | ICD-10-CM | POA: Insufficient documentation

## 2014-01-18 DIAGNOSIS — G8250 Quadriplegia, unspecified: Secondary | ICD-10-CM | POA: Diagnosis not present

## 2014-01-18 MED ORDER — TIZANIDINE HCL 4 MG PO TABS: 4.0000 mg | ORAL_TABLET | Freq: Four times a day (QID) | ORAL | Status: DC

## 2014-01-18 NOTE — Progress Notes (Signed)
Subjective:    Patient ID: Andrew Sullivan, male    DOB: 1970-05-08, 43 y.o.   MRN: [id number]  HPI   Andrew Sullivan is back regarding his SCI. He started the zanaflex without substantial effect. He is taking the zanaflex 4mg  twice per day essentially.    His UTI was treated.   He had  stents placed by urology last month. His urine is now running clear and he's feeling better.    Pain Inventory Average Pain 0 Pain Right Now 0 My pain is intermittent, dull and aching  In the last 24 hours, has pain interfered with the following? General activity 0 Relation with others 0 Enjoyment of life 0 What TIME of day is your pain at its worst? no pain Sleep (in general) Good  Pain is worse with: sitting and inactivity Pain improves with: no pain Relief from Meds: 0  Mobility ability to climb steps?  no do you drive?  no use a wheelchair  Function disabled: date disabled .  Neuro/Psych No problems in this area  Prior Studies Any changes since last visit?  no  Physicians involved in your care Any changes since last visit?  no   History reviewed. No pertinent family history. History   Social History  . Marital Status: Single    Spouse Name: N/A    Number of Children: N/A  . Years of Education: N/A   Social History Main Topics  . Smoking status: Former Smoker -- 1.00 packs/day for .5 years    Types: Cigarettes    Quit date: 12/27/1986  . Smokeless tobacco: Never Used  . Alcohol Use: No  . Drug Use: No  . Sexual Activity: None   Other Topics Concern  . None   Social History Narrative   1 brother 1 sister.  Pt. Lives in [place] with his mother.  denies any EtOH use tobacco or illicit drug use. Remains active.  Is on disability secondary to neck fracture.   Past Surgical History  Procedure Laterality Date  . Cervical fusion  1988    C4 -- C6 w/ spinal cord injury  . Hip arthroplasty  08/12/2011    Procedure: ARTHROPLASTY BIPOLAR HIP;  Surgeon: Mauri Pole, MD;  Location: [facility];  Service: Orthopedics;  Laterality: Left;  Hemi-arthroplasty  . Cystoscopy with retrograde pyelogram, ureteroscopy and stent placement Left 12/27/2013    Procedure: [place], URETEROSCOPY AND STENT PLACEMENT;  Surgeon: Reece Packer, MD;  Location: [place];  Service: Urology;  Laterality: Left;  . Cystoscopy/retrograde/ureteroscopy Left 01/09/2014    Procedure: CYSTOSCOPY/RETROGRADE/URETEROSCOPY, URETERAL BIOPSY AND STENT EXCHANGE;  Surgeon: Alexis Frock, MD;  Location: [facility];  Service: Urology;  Laterality: Left;   Past Medical History  Diagnosis Date  . Paraparesis of both lower limbs     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 45 in 70  . History of recurrent UTIs   . Erectile dysfunction   . Gross hematuria   . History of spinal cord injury     24 (age 65 fell from ladder) w/ cervical spine fx  s/p  c4 -- c6  fusion--  residual paraplegic bilateral legs (great below T8 uses w/c and can transfer self  . Spastic tetraplegia    BP 97/59 mmHg  Pulse 76  Resp 14  Ht 6\' 1"  (1.854 m)  Wt 173 lb (78.472 kg)  BMI 22.83 kg/m2  SpO2 96%  Opioid Risk Score:   Fall Risk Score:  Low Fall Risk (0-5 points)   Review of Systems  Constitutional: Negative.   HENT: Negative.   Eyes: Negative.   Respiratory: Negative.   Cardiovascular: Negative.   Gastrointestinal: Negative.   Endocrine: Negative.   Genitourinary: Negative.   Musculoskeletal: Negative.   Skin: Negative.   Allergic/Immunologic: Negative.   Neurological: Negative.   Hematological: Negative.   Psychiatric/Behavioral: Negative.        Objective:   Physical Exam  Gen: pleasant, alert/oriented.  HEENT: fair dentition, oral mucosa moist  Hrt: reg rate  Chest: clear  Abd: non tender non distended  Musc: hamstrings tight bilaterally---could not passively range past -30 neutral extension  Neuro: alert and oriented x3. Good insight and  awareness. Speech clear. MMT: Bilateral upper: deltoid, bicep, wrist all 5/5. Tricep 4+/5. HI 3- to 3/5 with some atrophy. RLE-tr to 1HF, 1 HAD, 0-tr KE,KF, ADF/APF. LLE-tr HF and HAD and 0/5 KE, KF, ADF/APF. Decreased hot/cold sensation and pain sense below the waist---difficult to assess along the trunk but slightly altered perhaps. DTR's 1+ UE's, LE's 3+ patella, Achilles. Clonus 4 beats right ankle, sustained left ankle.  Skin: intact  Psych: mood appropriate   Assessment:  1. C7 SCI incomplete with spastic tetraplegia more involving the LE's 2. Pain syndrome due to the above with flexion contractures at both knees 3. Neurogenic bladder/recurrent UTI's   Plan:  1. Continue baclofen at current dosing, 20mg  qid 2. Increase zanaflex to 4mg  qid.  3. Will set up for botox 400 units biateral hamstrings 4. Rx UTI/stents per urology 5. HEP 6. Follow up with me in about 4 weeks. Forty minutes of direct patient care/interaction were spent today during this office encounter 7. We discussed a baclofen pump briefly today

## 2014-01-18 NOTE — Patient Instructions (Signed)
PLEASE CALL ME WITH ANY PROBLEMS OR QUESTIONS (#[phone]).      

## 2014-02-24 ENCOUNTER — Encounter: Payer: Self-pay | Admitting: Physical Medicine & Rehabilitation

## 2014-02-24 ENCOUNTER — Encounter
Payer: Commercial Managed Care - HMO | Attending: Physical Medicine & Rehabilitation | Admitting: Physical Medicine & Rehabilitation

## 2014-02-24 VITALS — BP 132/70 | HR 69 | Resp 14

## 2014-02-24 DIAGNOSIS — S129XXS Fracture of neck, unspecified, sequela: Secondary | ICD-10-CM | POA: Diagnosis present

## 2014-02-24 DIAGNOSIS — G8250 Quadriplegia, unspecified: Secondary | ICD-10-CM | POA: Diagnosis not present

## 2014-02-24 DIAGNOSIS — R532 Functional quadriplegia: Secondary | ICD-10-CM

## 2014-02-24 MED ORDER — GABAPENTIN 300 MG PO CAPS: 300.0000 mg | ORAL_CAPSULE | Freq: Three times a day (TID) | ORAL | Status: DC

## 2014-02-24 MED ORDER — BACLOFEN 20 MG PO TABS: 20.0000 mg | ORAL_TABLET | Freq: Four times a day (QID) | ORAL | Status: DC

## 2014-02-24 NOTE — Progress Notes (Signed)
Subjective:    Patient ID: Andrew Sullivan, male    DOB: 1970-05-14, 43 y.o.   MRN: [id number]  HPI   Mr. Borgmeyer is here in follow up of his SCI . He feels that he's worsened since last month with more aching and pain in both of his legs. We received approval for his botox but we need to get the botox from his pharmacy.   The tizanidine didn't seem to make much difference. He's using it tid. He remains on his baclofen.  Pain Inventory Average Pain 3 Pain Right Now 4 My pain is constant, burning, dull and aching  In the last 24 hours, has pain interfered with the following? General activity 5 Relation with others 5 Enjoyment of life 5 What TIME of day is your pain at its worst? ALL Sleep (in general) Poor  Pain is worse with: sitting, inactivity and some activites Pain improves with: rest and medication Relief from Meds: 5  Mobility how many minutes can you walk? [place] ability to climb steps?  no do you drive?  yes use a wheelchair  Function disabled: date disabled .  Neuro/Psych No problems in this area weakness  Prior Studies Any changes since last visit?  no  Physicians involved in your care Any changes since last visit?  no   History reviewed. No pertinent family history. History   Social History  . Marital Status: Single    Spouse Name: N/A    Number of Children: N/A  . Years of Education: N/A   Social History Main Topics  . Smoking status: Former Smoker -- 1.00 packs/day for .5 years    Types: Cigarettes    Quit date: 12/27/1986  . Smokeless tobacco: Never Used  . Alcohol Use: No  . Drug Use: Yes    Special: "Crack" cocaine  . Sexual Activity: None   Other Topics Concern  . None   Social History Narrative   1 brother 1 sister.  Pt. Lives in [place] with his mother.  denies any EtOH use tobacco or illicit drug use. Remains active.  Is on disability secondary to neck fracture.   Past Surgical History  Procedure Laterality Date  . Cervical  fusion  1988    C4 -- C6 w/ spinal cord injury  . Hip arthroplasty  08/12/2011    Procedure: ARTHROPLASTY BIPOLAR HIP;  Surgeon: Mauri Pole, MD;  Location: [facility];  Service: Orthopedics;  Laterality: Left;  Hemi-arthroplasty  . Cystoscopy with retrograde pyelogram, ureteroscopy and stent placement Left 12/27/2013    Procedure: [place], URETEROSCOPY AND STENT PLACEMENT;  Surgeon: Reece Packer, MD;  Location: [place];  Service: Urology;  Laterality: Left;  . Cystoscopy/retrograde/ureteroscopy Left 01/09/2014    Procedure: CYSTOSCOPY/RETROGRADE/URETEROSCOPY, URETERAL BIOPSY AND STENT EXCHANGE;  Surgeon: Alexis Frock, MD;  Location: [facility];  Service: Urology;  Laterality: Left;   Past Medical History  Diagnosis Date  . Paraparesis of both lower limbs     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 61 in 17  . History of recurrent UTIs   . Erectile dysfunction   . Gross hematuria   . History of spinal cord injury     4 (age 68 fell from ladder) w/ cervical spine fx  s/p  c4 -- c6  fusion--  residual paraplegic bilateral legs (great below T8 uses w/c and can transfer self  . Spastic tetraplegia    BP 132/70 mmHg  Pulse 69  Resp 14  SpO2 96%  Opioid Risk Score:   Fall Risk Score: Low Fall Risk (0-5 points)  Review of Systems  HENT: Negative.   Eyes: Negative.   Respiratory: Negative.   Cardiovascular: Negative.   Gastrointestinal: Negative.   Endocrine: Negative.   Genitourinary: Negative.   Musculoskeletal: Positive for myalgias and back pain.  Skin: Negative.   Allergic/Immunologic: Negative.   Neurological: Positive for weakness and numbness.  Hematological: Negative.   Psychiatric/Behavioral: Positive for dysphoric mood.       Objective:   Physical Exam  Gen: pleasant, alert/oriented.  HEENT: fair dentition, oral mucosa moist  Hrt: reg rate  Chest: clear  Abd: non tender non distended  Musc: hamstrings  tight bilaterally---could not passively range past -30 neutral extension  Neuro: alert and oriented x3. Good insight and awareness. Speech clear. MMT: Bilateral upper: deltoid, bicep, wrist all 5/5. Tricep 4+/5. HI 3- to 3/5 with some atrophy. RLE-tr to 1HF, 1 HAD, 0-tr KE,KF, ADF/APF. LLE-tr HF and HAD and 0/5 KE, KF, ADF/APF. Decreased hot/cold sensation and pain sense below the waist---difficult to assess along the trunk but slightly altered perhaps. DTR's 1+ UE's, LE's 3+ patella, Achilles. Clonus 4 beats right ankle, sustained left ankle. Tone 3/4 bilateral hamstrings, 2/4 achilles, trace HAD's. Skin: intact  Psych: mood appropriate   Assessment:  1. C7 SCI incomplete with spastic tetraplegia more involving the LE's 2. Pain syndrome due to the above with flexion contractures at both knees 3. Neurogenic bladder/recurrent UTI's    Plan:  1. Continue baclofen at current dosing, 20mg  qid 2. Taper zanaflex to 4mg  to off.  3. Botox 400 units biateral hamstrings- need to have botox sent over from his pharmacy per his insurance 4. Rx UTI/stents per urology 5. Trial of gabapentin, titrating up to 300mg  TID 6. Follow up with me in about 2-4 weeks. 15 minutes of direct patient care/interaction were spent today during this office encounter 7. Baclofen pump?

## 2014-02-24 NOTE — Patient Instructions (Signed)
WEEK 1 TIZANIDINE TWICE DAILY GABAPENTIN 300MG  AT NIGHT  WEEK2 TIZANIDINE ONCE DAILY GABAPENTIN 300MG  TWICE DAILY  WEEK 3+ STOP TIZANIDINE GABAPENTIN 300MG  THREE X DAILY

## 2014-02-27 ENCOUNTER — Telehealth: Payer: Self-pay | Admitting: *Deleted

## 2014-02-27 NOTE — Telephone Encounter (Signed)
Pt says gabapentin is presenting side effects including dizziness, headache, weakness, lethargy. Pt is asking for an alternative medication

## 2014-02-28 ENCOUNTER — Telehealth: Payer: Self-pay | Admitting: Family Medicine

## 2014-02-28 NOTE — Telephone Encounter (Signed)
Called patient and suggested to start off slowly with the Gabapentin.  He said he felt bad after only one does.  He took it 2 days in a row and says that he cannot tolerate it.  Patient is asking if there is an alternative

## 2014-02-28 NOTE — Telephone Encounter (Signed)
Corene Cornea from the [facility] called and needs [place] to do a referral to see Dr. Naaman Plummer. Please call him at [phone] ext [phone] and ask for [facility]. jw

## 2014-03-01 NOTE — Telephone Encounter (Signed)
[employer] auth obtained #[id number]

## 2014-03-06 MED ORDER — CARBAMAZEPINE 200 MG PO TABS: 200.0000 mg | ORAL_TABLET | Freq: Two times a day (BID) | ORAL | Status: DC

## 2014-03-06 NOTE — Telephone Encounter (Signed)
Stop gabapentin. Begin tegretol 200mg  qhs for one week then increase to bid.  rx sent to pharmacy

## 2014-03-06 NOTE — Telephone Encounter (Signed)
Notified pt. 

## 2014-03-20 ENCOUNTER — Encounter
Payer: Commercial Managed Care - HMO | Attending: Physical Medicine & Rehabilitation | Admitting: Physical Medicine & Rehabilitation

## 2014-03-20 ENCOUNTER — Encounter: Payer: Self-pay | Admitting: Physical Medicine & Rehabilitation

## 2014-03-20 VITALS — BP 115/35 | HR 77 | Resp 14

## 2014-03-20 DIAGNOSIS — S129XXS Fracture of neck, unspecified, sequela: Secondary | ICD-10-CM | POA: Insufficient documentation

## 2014-03-20 DIAGNOSIS — G8250 Quadriplegia, unspecified: Secondary | ICD-10-CM | POA: Diagnosis not present

## 2014-03-20 NOTE — Patient Instructions (Signed)
Hamstring Strain with Rehab The hamstring muscle and tendons are vulnerable to muscle or tendon tear (strain). Hamstring tears cause pain and inflammation in the backside of the thigh, where the hamstring muscles are located. The hamstring is comprised of three muscles that are responsible for straightening the hip, bending the knee, and stabilizing the knee. These muscles are important for walking, running, and jumping. Hamstring strain is the most common injury of the thigh. Hamstring strains are classified as grade 1 or 2 strains. Grade 1 strains cause pain, but the tendon is not lengthened. Grade 2 strains include a lengthened ligament due to the ligament being stretched or partially ruptured. With grade 2 strains there is still function, although the function may be decreased.  SYMPTOMS   Pain, tenderness, swelling, warmth, or redness over the hamstring muscles, at the back of the thigh.  Pain that gets worse during and after intense activity.  A "pop" heard in the area, at the time of injury.  Muscle spasm in the hamstring muscles.  Pain or weakness with running, jumping, or bending the knee against resistance.  Crackling sound (crepitation) when the tendon is moved or touched.  Bruising (contusion) in the thigh within 48 hours of injury.  Loss of fullness of the muscle, or area of muscle bulging in the case of a complete rupture. CAUSES  A muscle strain occurs when a force is placed on the muscle or tendon that is greater than it can withstand. Common causes of injury include:  Strain from overuse or sudden increase in the frequency, duration, or intensity of activity.  Single violent blow or force to the back of the knee or the hamstring area of the thigh. RISK INCREASES WITH:  Sports that require quick starts (sprinting, racquetball, tennis).  Sports that require jumping (basketball, volleyball).  Kicking sports and water skiing.  Contact sports (soccer, football).  Poor  strength and flexibility.  Failure to warm up properly before activity.  Previous thigh, knee, or pelvis injury.  Poor exercise technique.  Poor posture. PREVENTION  Maintain physical fitness:  Strength, flexibility, and endurance.  Cardiovascular fitness.  Learn and use proper exercise technique and posture.  Wear proper fitted and padded protective equipment. PROGNOSIS  If treated properly, hamstring strains are usually curable in 2 to 6 weeks. RELATED COMPLICATIONS   Longer healing time, if not properly treated or if not given adequate time to heal.  Chronically inflamed tendon, causing persistent pain with activity that may progress to constant pain.  Recurring symptoms, if activity is resumed too soon.  Vulnerable to repeated injury (in up to 25% of cases). TREATMENT  Treatment first involves the use of ice and medication to help reduce pain and inflammation. It is also important to complete strengthening and stretching exercises, as well as modifying any activities that aggravate the symptoms. These exercises may be completed at home or with a therapist. Your caregiver may recommend the use of crutches to help reduce pain and discomfort, especially is the strain is severe enough to cause limping. If the tendon has pulled away from the bone, then surgery is necessary to reattach it. MEDICATION   If pain medicine is needed, nonsteroidal anti-inflammatory medicines (aspirin and ibuprofen), or other minor pain relievers (acetaminophen), are often advised.  Do not take pain medicine for 7 days before surgery.  Prescription pain relievers may be given if your caregiver thinks they are needed. Use only as directed and only as much as you need.  Corticosteroid injections may be  recommended. However, these injections should only be used for serious cases, as they can only be given a certain number of times.  Ointments applied to the skin may be beneficial. HEAT AND  COLD  Cold treatment (icing) relieves pain and reduces inflammation. Cold treatment should be applied for 10 to 15 minutes every 2 to 3 hours, and immediately after activity that aggravates your symptoms. Use ice packs or an ice massage.  Heat treatment may be used before performing the stretching and strengthening activities prescribed by your caregiver, physical therapist, or athletic trainer. Use a heat pack or a warm water soak. SEEK MEDICAL CARE IF:   Symptoms get worse or do not improve in 2 weeks, despite treatment.  New, unexplained symptoms develop. (Drugs used in treatment may produce side effects.) EXERCISES RANGE OF MOTION (ROM) AND STRETCHING EXERCISES - Hamstring Strain These exercises may help you when beginning to rehabilitate your injury. Your symptoms may go away with or without further involvement from your physician, physical therapist or athletic trainer. While completing these exercises, remember:   Restoring tissue flexibility helps normal motion to return to the joints. This allows healthier, less painful movement and activity.  An effective stretch should be held for at least 30 seconds.  A stretch should never be painful. You should only feel a gentle lengthening or release in the stretched tissue. STRETCH - Hamstrings, Standing  Stand or sit, and extend your right / left leg, placing your foot on a chair or foot stool.  Keep a slight arch in your low back and your hips straight forward.  Lead with your chest, and lean forward at the waist until you feel a gentle stretch in the back of your right / left knee or thigh. (When done correctly, this exercise requires leaning only a small distance.)  Hold this position for __________ seconds. Repeat __________ times. Complete this stretch __________ times per day. STRETCH - Hamstrings, Supine   Lie on your back. Loop a belt or towel over the ball of your right / left foot.  Straighten your right / left knee and  slowly pull on the belt to raise your leg. Do not allow the right / left knee to bend. Keep your opposite leg flat on the floor.  Raise the leg until you feel a gentle stretch behind your right / left knee or thigh. Hold this position for __________ seconds. Repeat __________ times. Complete this stretch __________ times per day.  STRETCH - Hamstrings, Doorway  Lie on your back with your right / left leg extended and resting on the wall, and the opposite leg flat on the ground through the door. Initially, position your bottom farther away from the wall.  Keep your right / left knee straight. If you feel a stretch behind your knee or thigh, hold this position for __________ seconds.  If you do not feel a stretch, scoot your bottom closer to the door and hold __________ seconds. Repeat __________ times. Complete this stretch __________ times per day.  STRETCH - Hamstrings/Adductors, V-Sit   Sit on the floor with your legs extended in a large "V," keeping your knees straight.  With your head and chest upright, bend at your waist reaching for your left foot to stretch your right thigh muscles.  You should feel a stretch in your right inner thigh. Hold for __________ seconds.  Return to the upright position to relax your leg muscles.  Continuing to keep your chest upright, bend straight forward at your  waist to stretch your hamstrings.  You should feel a stretch behind both of your thighs and knees. Hold for __________ seconds.  Return to the upright position to relax your leg muscles.  With your head and chest upright, bend at your waist reaching for your right foot to stretch your left thigh muscles.  You should feel a stretch in your left inner thigh. Hold for __________ seconds.  Return to the upright position to relax your leg muscles. Repeat __________ times. Complete this exercise __________ times per day.  STRENGTHENING EXERCISES - Hamstring Strain These exercises may help you  when beginning to rehabilitate your injury. They may resolve your symptoms with or without further involvement from your physician, physical therapist or athletic trainer. While completing these exercises, remember:   Muscles can gain both the endurance and the strength needed for everyday activities through controlled exercises.  Complete these exercises as instructed by your physician, physical therapist or athletic trainer. Increase the resistance and repetitions only as guided.  You may experience muscle soreness or fatigue, but the pain or discomfort you are trying to eliminate should never get worse during these exercises. If this pain does get worse, stop and make certain you are following the directions exactly. If the pain is still present after adjustments, discontinue the exercise until you can discuss the trouble with your clinician. STRENGTH - Hip Extensors, Straight Leg Raises   Lie on your stomach on a firm surface.  Tense the muscles in your buttocks to lift your right / left leg about 4 inches. If you cannot lift your leg this high without arching your back, place a pillow under your hips.  Keep your knee straight. Hold for __________ seconds.  Slowly lower your leg to the starting position and allow it to relax completely before starting the next repetition. Repeat __________ times. Complete this exercise __________ times per day.  STRENGTH - Hamstring, Isometrics   Lie on your back on a firm surface.  Bend your right / left knee approximately __________ degrees.  Dig your heel into the surface, as if you are trying to pull it toward your buttocks. Tighten the muscles in the back of your thighs to "dig" as hard as you can, without increasing any pain.  Hold this position for __________ seconds.  Release the tension gradually and allow your muscles to completely relax for __________ seconds between each exercise. Repeat __________ times. Complete this exercise __________  times per day.  STRENGTH - Hamstring, Curls   Lay on your stomach with your legs extended. (If you lay on a bed, your feet may hang over the edge.)  Tighten the muscles in the back of your thigh to bend your right / left knee up to 90 degrees. Keep your hips flat on the bed or floor.  Hold this position for __________ seconds.  Slowly lower your leg back to the starting position. Repeat __________ times. Complete this exercise __________ times per day.  OPTIONAL ANKLE WEIGHTS: Begin with ____________________, but DO NOT exceed ____________________. Increase in 1 pound/0.5 kilogram increments. Document Released: 02/24/2005 Document Revised: 05/19/2011 Document Reviewed: 06/08/2008 [facility] Patient Information 2015 [place], [place]. This information is not intended to replace advice given to you by your health care provider. Make sure you discuss any questions you have with your health care provider.

## 2014-03-20 NOTE — Progress Notes (Signed)
Botox Injection for spasticity using needle EMG guidance Indication: spastic tetraplegia  Dilution: 100 Units/ml        Total Units Injected: 400 units Indication: Severe spasticity which interferes with ADL,mobility and/or  hygiene and is unresponsive to medication management and other conservative care Informed consent was obtained after describing risks and benefits of the procedure with the patient. This includes bleeding, bruising, infection, excessive weakness, or medication side effects. A REMS form is on file and signed.  Needle: 23mm injectable monopolar needle electrode  Number of units per muscle    Hamstrings 400 units total Right Semimembranosus/semitendinsos: 75units Right Biceps femoris 125 units Left Semimem/semitend: 100 units Left Biceps femoris: 100units All injections were done after obtaining appropriate EMG activity and after negative drawback for blood. The patient tolerated the procedure well. Post procedure instructions were given. A followup appointment was made.   I made a referral to outpt therapies for spasticity mgt as well.

## 2014-03-27 ENCOUNTER — Encounter: Payer: Self-pay | Admitting: Physical Therapy

## 2014-03-27 ENCOUNTER — Ambulatory Visit: Payer: Medicare HMO | Attending: Physical Medicine & Rehabilitation | Admitting: Physical Therapy

## 2014-03-27 DIAGNOSIS — R6889 Other general symptoms and signs: Secondary | ICD-10-CM | POA: Insufficient documentation

## 2014-03-27 DIAGNOSIS — M6249 Contracture of muscle, multiple sites: Secondary | ICD-10-CM | POA: Insufficient documentation

## 2014-03-27 DIAGNOSIS — IMO0001 Reserved for inherently not codable concepts without codable children: Secondary | ICD-10-CM

## 2014-03-27 DIAGNOSIS — M6289 Other specified disorders of muscle: Secondary | ICD-10-CM

## 2014-03-27 DIAGNOSIS — G8250 Quadriplegia, unspecified: Secondary | ICD-10-CM

## 2014-03-27 DIAGNOSIS — R269 Unspecified abnormalities of gait and mobility: Secondary | ICD-10-CM | POA: Diagnosis not present

## 2014-03-27 NOTE — Therapy (Signed)
[facility] [address] [place], [place], [address] Phone: [phone]   Fax:  [phone]  Physical Therapy Evaluation  Patient Details  Name: Andrew Sullivan MRN: [id number] Date of Birth: 12-27-1970 Referring Provider:  Meredith Staggers, MD  Encounter Date: 03/27/2014      PT End of Session - 03/27/14 1315    Visit Number 1   Number of Visits 18   Date for PT Re-Evaluation 05/26/14   PT Start Time 1315   PT Stop Time 1400   PT Time Calculation (min) 45 min   Equipment Utilized During Treatment Gait belt   Activity Tolerance Patient tolerated treatment well   Behavior During Therapy [facility] for tasks assessed/performed      Past Medical History  Diagnosis Date  . Paraparesis of both lower limbs     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 58 in 30  . History of recurrent UTIs   . Erectile dysfunction   . Gross hematuria   . History of spinal cord injury     70 (age 73 fell from ladder) w/ cervical spine fx  s/p  c4 -- c6  fusion--  residual paraplegic bilateral legs (great below T8 uses w/c and can transfer self  . Spastic tetraplegia     Past Surgical History  Procedure Laterality Date  . Cervical fusion  1988    C4 -- C6 w/ spinal cord injury  . Hip arthroplasty  08/12/2011    Procedure: ARTHROPLASTY BIPOLAR HIP;  Surgeon: Mauri Pole, MD;  Location: [facility];  Service: Orthopedics;  Laterality: Left;  Hemi-arthroplasty  . Cystoscopy with retrograde pyelogram, ureteroscopy and stent placement Left 12/27/2013    Procedure: [place], URETEROSCOPY AND STENT PLACEMENT;  Surgeon: Reece Packer, MD;  Location: [facility];  Service: Urology;  Laterality: Left;  . Cystoscopy/retrograde/ureteroscopy Left 01/09/2014    Procedure: CYSTOSCOPY/RETROGRADE/URETEROSCOPY, URETERAL BIOPSY AND STENT EXCHANGE;  Surgeon: Alexis Frock, MD;  Location: [facility];  Service:  Urology;  Laterality: Left;    There were no vitals taken for this visit.  Visit Diagnosis:  Muscle hypertonicity - Plan: PT plan of care cert/re-cert  Quadriparesis (muscle weakness) - Plan: PT plan of care cert/re-cert  Abnormality of gait - Plan: PT plan of care cert/re-cert  Activity intolerance - Plan: PT plan of care cert/re-cert      Subjective Assessment - 03/27/14 1321    Symptoms This 44yo male had a Botox injection on 03/20/14 to bilateral LE hamstrings. He presents to PT for evaluation for stretching, pain & walking.   Patient Stated Goals He wants to walk more.   Currently in Pain? No/denies          [facility] PT Assessment - 03/27/14 1315    Assessment   Medical Diagnosis Spastic Tetraplegia   Onset Date 08/30/86   Precautions   Precautions Fall   Restrictions   Weight Bearing Restrictions No   Balance Screen   Has the patient fallen in the past 6 months No   Has the patient had a decrease in activity level because of a fear of falling?  No   Is the patient reluctant to leave their home because of a fear of falling?  No   Home Environment   Living Enviornment Private residence   Living Arrangements Parent  mother & her husband   Type of [place] Access Level entry   Home Layout One level  Home Equipment Wheelchair - manual;Hospital bed;Walker - 2 wheels;Crutches   Prior Function   Level of Independence Independent with gait;Independent with transfers  uses rolling walker for ~1 minute in home & community   Vocation On disability   Cognition   Overall Cognitive Status Within Functional Limits for tasks assessed   Posture/Postural Control   Posture/Postural Control Postural limitations   Postural Limitations Rounded Shoulders;Forward head;Posterior pelvic tilt;Flexed trunk  sacral sits    PROM   Overall PROM  Deficits   Left Hip Extension -15   Left Hip Flexion 80   Strength   Overall Strength Deficits   Overall Strength Comments UE  shoulders grossly 4/5, elbows & wrist 4-/5, grip poor   Right Hip Flexion 2-/5   Right Hip Extension 0/5   Right Hip ABduction 2-/5   Left Hip Flexion 2-/5   Left Hip Extension 1/5   Left Hip ABduction 2-/5   Right Knee Flexion 1/5   Right Knee Extension 2-/5   Left Knee Flexion 1/5   Left Knee Extension 2-/5   Right Hip   Right Hip Extension -10   Right Hip Flexion 95   Ambulation/Gait   Ambulation/Gait Yes   Ambulation/Gait Assistance 4: Min guard   Ambulation Distance (Feet) 200 Feet   Assistive device Rolling walker   Gait Pattern Step-through pattern;Decreased step length - right;Decreased step length - left;Decreased stride length;Decreased hip/knee flexion - right;Decreased hip/knee flexion - left;Left hip hike;Right hip hike;Right flexed knee in stance;Left flexed knee in stance;Decreased trunk rotation;Trunk flexed;Poor foot clearance - left;Poor foot clearance - right   Ambulation Surface Level;Indoor   Gait velocity 0.32 ft/sec   Static Sitting Balance   Static Sitting - Balance Support No upper extremity supported   Static Sitting - Level of Assistance 5: Stand by assistance   Dynamic Sitting Balance   Dynamic Sitting - Balance Support Feet supported;No upper extremity supported   Dynamic Sitting - Level of Assistance 4: Min assist   Dynamic Sitting Balance - Compensations sacral sitting   Reach (Patient is able to reach ___ inches to right, left, forward, back) 1"   Dynamic Sitting - Balance Activities Lateral lean/weight shifting;Forward lean/weight shifting   Static Standing Balance   Static Standing - Balance Support Bilateral upper extremity supported   Static Standing - Level of Assistance 5: Stand by assistance   Static Standing - Comment/# of Minutes maintains upright 3 seconds without UE support with contact assist.   Dynamic Standing Balance   Dynamic Standing - Balance Support Right upper extremity supported;Left upper extremity supported  Using RW for  support   Dynamic Standing - Level of Assistance 5: Stand by assistance   Dynamic Standing - Balance Activities Reaching for objects   Dynamic Standing - Comments reaches 2" beyond once UE lifted as high as able   RLE Tone   RLE Tone Hypertonic   RLE Tone   Hypertonic Details With hips flexed in sitting, 2/4; supine with hips ext 4/4   LLE Tone   LLE Tone Hypertonic   LLE Tone   Hypertonic Details With hips flexed in sitting, 2/4; supine with hips ext 4/4                            PT Short Term Goals - 03/27/14 1725    PT SHORT TERM GOAL #1   Title demonstrates updated HEP correctly (Target Date: 04/26/14)   Time 1   Period  Months   Status New   PT SHORT TERM GOAL #2   Title reaches 4" in standing with RW support with supervision (Target Date: 04/26/14)   Time 1   Period Months   Status New   PT SHORT TERM GOAL #3   Title knee extension in standing with RW support -30 degrees bilaterally. (Target Date: 04/26/14)   Time 1   Period Months   Status New           PT Long Term Goals - April 21, 2014 1727    PT LONG TERM GOAL #1   Title verbalizes ongoing progressive HEP including fitness program at fitness club. (Target Date: 05/26/14)   Time 2   Period Months   Status New   PT LONG TERM GOAL #2   Title reports pain with gait /standing in hamstrings </= 2/10 (Target Date: 05/26/14)   Time 2   Period Months   Status New   PT LONG TERM GOAL #3   Title reaches 6" in standing with RW support safely. (Target Date: 05/26/14)   Time 2   Period Months   Status New   PT LONG TERM GOAL #4   Title ambulates 300' with RW safely with supervision. (Target Date: 05/26/14)   Time 2   Period Months   Status New   PT LONG TERM GOAL #5   Title Gait Velocity >0.5 ft/sec (Target Date: 05/26/14)   Time 2   Period Months               Plan - 04-21-14 1719    Clinical Impression Statement This 44yo male had Botox injections to bilateral LE hamstings on 03/20/14. He  presents to PT for evaluation to use tone reduction to improve mobility. His hypertoncity is high with supine of sidelying, but 2/4 in sitting. He ambulated 200' with RW which he reports is longest walk in >year. Patient needs skilled instruction to reestablish HEP, balance and gait.   Pt will benefit from skilled therapeutic intervention in order to improve on the following deficits Abnormal gait;Decreased activity tolerance;Decreased balance;Decreased mobility;Decreased knowledge of use of DME;Decreased range of motion;Decreased strength;Impaired tone   Rehab Potential Good   Clinical Impairments Affecting Rehab Potential time since injury   PT Frequency 2x / week   PT Duration Other (comment)  9 weeks (2 months)   PT Treatment/Interventions ADLs/Self Care Home Management;Gait training;DME Instruction;Stair training;Functional mobility training;Therapeutic activities;Therapeutic exercise;Balance training;Neuromuscular re-education;Patient/family education;Manual techniques   PT Next Visit Plan HEP for stretching hamstrings, NuStep for reciprocal movements   Consulted and Agree with Plan of Care Patient          G-Codes - 2014/04/21 1733    Functional Assessment Tool Used Gait Velocity 0.32 ft/sec; ambulates 200' with RW with minimal assist.   Functional Limitation Mobility: Walking and moving around   Mobility: Walking and Moving Around Current Status [phone]) At least 80 percent but less than 100 percent impaired, limited or restricted   Mobility: Walking and Moving Around Goal Status [phone]) At least 60 percent but less than 80 percent impaired, limited or restricted       Problem List Patient Active Problem List   Diagnosis Date Noted  . Spastic tetraplegia 12/06/2013  . Hematuria 11/05/2013  . Contact dermatitis 05/12/2013  . Rash of hands 05/12/2013  . Fatigue 11/16/2012  . Anemia 11/16/2012  . History of recurrent UTIs 05/19/2010  . Functional quadriplegia 05/10/2009  .  HEMATURIA 07/29/2007  . Closed fracture of cervical vertebra  07/29/2007    Mackayla Mullins PT, DPT 03/27/2014, 5:35 PM  [facility] [address] [place], [place], [address] Phone: [phone]   Fax:  [phone]

## 2014-03-29 ENCOUNTER — Encounter: Payer: Self-pay | Admitting: Physical Therapy

## 2014-03-29 ENCOUNTER — Ambulatory Visit: Payer: Medicare HMO | Admitting: Physical Therapy

## 2014-03-29 DIAGNOSIS — R6889 Other general symptoms and signs: Secondary | ICD-10-CM

## 2014-03-29 DIAGNOSIS — M6289 Other specified disorders of muscle: Secondary | ICD-10-CM

## 2014-03-29 DIAGNOSIS — R269 Unspecified abnormalities of gait and mobility: Secondary | ICD-10-CM | POA: Diagnosis not present

## 2014-03-29 DIAGNOSIS — IMO0001 Reserved for inherently not codable concepts without codable children: Secondary | ICD-10-CM

## 2014-03-29 DIAGNOSIS — G8250 Quadriplegia, unspecified: Secondary | ICD-10-CM

## 2014-03-29 DIAGNOSIS — M6249 Contracture of muscle, multiple sites: Secondary | ICD-10-CM | POA: Diagnosis not present

## 2014-03-29 NOTE — Patient Instructions (Signed)
Hamstring and hip flexor stretching ; discussed muscle spindles and contract relax stretching and concept of reciprocal mvt with nu step to affect tone

## 2014-03-29 NOTE — Therapy (Signed)
[facility] [address] [place], [place], [address] Phone: [phone]   Fax:  [phone]  Physical Therapy Treatment  Patient Details  Name: Andrew Sullivan MRN: [id number] Date of Birth: 03/18/1970 Referring Provider:  Timmothy Euler, MD  Encounter Date: 03/29/2014      PT End of Session - 03/29/14 1315    Visit Number 2   Number of Visits 18   Date for PT Re-Evaluation 05/26/14   PT Start Time 1315   PT Stop Time 1400   PT Time Calculation (min) 45 min   Equipment Utilized During Treatment Gait belt   Activity Tolerance Patient tolerated treatment well   Behavior During Therapy [facility] for tasks assessed/performed      Past Medical History  Diagnosis Date  . Paraparesis of both lower limbs     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 65 in 39  . History of recurrent UTIs   . Erectile dysfunction   . Gross hematuria   . History of spinal cord injury     12 (age 63 fell from ladder) w/ cervical spine fx  s/p  c4 -- c6  fusion--  residual paraplegic bilateral legs (great below T8 uses w/c and can transfer self  . Spastic tetraplegia     Past Surgical History  Procedure Laterality Date  . Cervical fusion  1988    C4 -- C6 w/ spinal cord injury  . Hip arthroplasty  08/12/2011    Procedure: ARTHROPLASTY BIPOLAR HIP;  Surgeon: Mauri Pole, MD;  Location: [facility];  Service: Orthopedics;  Laterality: Left;  Hemi-arthroplasty  . Cystoscopy with retrograde pyelogram, ureteroscopy and stent placement Left 12/27/2013    Procedure: [place], URETEROSCOPY AND STENT PLACEMENT;  Surgeon: Reece Packer, MD;  Location: [facility];  Service: Urology;  Laterality: Left;  . Cystoscopy/retrograde/ureteroscopy Left 01/09/2014    Procedure: CYSTOSCOPY/RETROGRADE/URETEROSCOPY, URETERAL BIOPSY AND STENT EXCHANGE;  Surgeon: Alexis Frock, MD;  Location: [facility];  Service:  Urology;  Laterality: Left;    There were no vitals taken for this visit.  Visit Diagnosis:  Muscle hypertonicity  Quadriparesis (muscle weakness)  Abnormality of gait  Activity intolerance      Subjective Assessment - 03/29/14 1428    Symptoms I get up during commercial breaks; i havnt had any skin break down; i don't just sit around I try to stay active; I really like the nustep             Therapeutic exercise Total motion release;tech/assessment Trunk rotatoin; Right 25% deficit compared to Left UE elevation;  left arm 10% deficit compared to right LE leg lift; ; left harder; he can only lift foot about 1 " bilaterally  After right arm lifting over head 2 x 15; he was able to lift the right leg about 2.5" and the trunk rotation was symmetrical  Standing hamstring stretch at table (bed at home); vq's for keeping back straight and trying to contract quads for knee extension Seated hamstring stretch; one leg a time ; instructed in trying to do a contract relax stretch by pressing heel in to the bed (he was seated with one leg up on the table) With stretching recommended 5 bouts with 30 second - 1 minute hold each Standing in parallel bars; runners stretch for hip flexor stretch;   Nustep x 20 min level 5  Had t-band to hold legs from adducting together  PT Education - 03/29/14 1430    Education provided Yes   Education Details stretching ex's   Person(s) Educated Patient   Methods Demonstration   Comprehension Returned demonstration          PT Short Term Goals - 03/27/14 1725    PT SHORT TERM GOAL #1   Title demonstrates updated HEP correctly (Target Date: 04/26/14)   Time 1   Period Months   Status New   PT SHORT TERM GOAL #2   Title reaches 4" in standing with RW support with supervision (Target Date: 04/26/14)   Time 1   Period Months   Status New   PT SHORT TERM GOAL #3   Title knee extension in standing with RW support -30  degrees bilaterally. (Target Date: 04/26/14)   Time 1   Period Months   Status New           PT Long Term Goals - 03/27/14 1727    PT LONG TERM GOAL #1   Title verbalizes ongoing progressive HEP including fitness program at fitness club. (Target Date: 05/26/14)   Time 2   Period Months   Status New   PT LONG TERM GOAL #2   Title reports pain with gait /standing in hamstrings </= 2/10 (Target Date: 05/26/14)   Time 2   Period Months   Status New   PT LONG TERM GOAL #3   Title reaches 6" in standing with RW support safely. (Target Date: 05/26/14)   Time 2   Period Months   Status New   PT LONG TERM GOAL #4   Title ambulates 300' with RW safely with supervision. (Target Date: 05/26/14)   Time 2   Period Months   Status New   PT LONG TERM GOAL #5   Title Gait Velocity >0.5 ft/sec (Target Date: 05/26/14)   Time 2   Period Months               Plan - 03/29/14 1432    Clinical Impression Statement Due to quad weakness he has difficulty with knee extension; the TMR ex's only had minimal effect; He did very well with nustep and was able to get good positioning for seated hamstring and standing hip flex; stretching; the standing ham string stretch is limited by his abilty to actilvey get his knee in an extended position due to quad weakness   PT Next Visit Plan assess effectivenes of HEP strethching        Problem List Patient Active Problem List   Diagnosis Date Noted  . Spastic tetraplegia 12/06/2013  . Hematuria 11/05/2013  . Contact dermatitis 05/12/2013  . Rash of hands 05/12/2013  . Fatigue 11/16/2012  . Anemia 11/16/2012  . History of recurrent UTIs 05/19/2010  . Functional quadriplegia 05/10/2009  . HEMATURIA 07/29/2007  . Closed fracture of cervical vertebra 07/29/2007    Jana Hakim 03/29/2014, 2:36 PM  [facility] [address] [place], [place], [address] Phone: [phone]   Fax:   [phone]

## 2014-04-03 ENCOUNTER — Ambulatory Visit: Payer: Medicare HMO

## 2014-04-03 DIAGNOSIS — M6289 Other specified disorders of muscle: Secondary | ICD-10-CM

## 2014-04-03 DIAGNOSIS — R6889 Other general symptoms and signs: Secondary | ICD-10-CM | POA: Diagnosis not present

## 2014-04-03 DIAGNOSIS — G8250 Quadriplegia, unspecified: Secondary | ICD-10-CM | POA: Diagnosis not present

## 2014-04-03 DIAGNOSIS — R269 Unspecified abnormalities of gait and mobility: Secondary | ICD-10-CM | POA: Diagnosis not present

## 2014-04-03 DIAGNOSIS — IMO0001 Reserved for inherently not codable concepts without codable children: Secondary | ICD-10-CM

## 2014-04-03 DIAGNOSIS — M6249 Contracture of muscle, multiple sites: Secondary | ICD-10-CM | POA: Diagnosis not present

## 2014-04-03 NOTE — Therapy (Signed)
[facility] [address] [place], [place], [address] Phone: [phone]   Fax:  [phone]  Physical Therapy Treatment  Patient Details  Name: Andrew Sullivan MRN: [id number] Date of Birth: 1970/06/04 Referring Provider:  Timmothy Euler, MD  Encounter Date: 04/03/2014      PT End of Session - 04/03/14 1452    Visit Number 3   Number of Visits 18   Date for PT Re-Evaluation 05/26/14   PT Start Time 1230   PT Stop Time 1315   PT Time Calculation (min) 45 min      Past Medical History  Diagnosis Date  . Paraparesis of both lower limbs     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 47 in 81  . History of recurrent UTIs   . Erectile dysfunction   . Gross hematuria   . History of spinal cord injury     67 (age 69 fell from ladder) w/ cervical spine fx  s/p  c4 -- c6  fusion--  residual paraplegic bilateral legs (great below T8 uses w/c and can transfer self  . Spastic tetraplegia     Past Surgical History  Procedure Laterality Date  . Cervical fusion  1988    C4 -- C6 w/ spinal cord injury  . Hip arthroplasty  08/12/2011    Procedure: ARTHROPLASTY BIPOLAR HIP;  Surgeon: Mauri Pole, MD;  Location: [facility];  Service: Orthopedics;  Laterality: Left;  Hemi-arthroplasty  . Cystoscopy with retrograde pyelogram, ureteroscopy and stent placement Left 12/27/2013    Procedure: [place], URETEROSCOPY AND STENT PLACEMENT;  Surgeon: Reece Packer, MD;  Location: [facility];  Service: Urology;  Laterality: Left;  . Cystoscopy/retrograde/ureteroscopy Left 01/09/2014    Procedure: CYSTOSCOPY/RETROGRADE/URETEROSCOPY, URETERAL BIOPSY AND STENT EXCHANGE;  Surgeon: Alexis Frock, MD;  Location[facility];  Service: Urology;  Laterality: Left;    There were no vitals taken for this visit.  Visit Diagnosis:  Muscle hypertonicity  Quadriparesis (muscle  weakness)  Abnormality of gait  Activity intolerance      Subjective Assessment - 04/03/14 1246    Symptoms no pain, no complaints   Currently in Pain? No/denies     Passive stretching of hamstrings and hip adductors with flexion techniques to decrease extensor tone  Prolonged hip adductor stretch with blue physioball x5 minutes and pt in hooklying Discussed ways to perform this at home.  Prolonged RLE quadriceps stretch in supine with RLE dangling and maintained in a knee flexed position. Discussed ways to perform this at home.   Performed long sitting pull ups with sheet in BUE for core strength/stability and hamstring stretch. Required MOD A to perform 2x10 with notable lateral sway. Assist required to prevent lateral loss of balance and to promote anterior pelvic tilt for increased stretch.                        PT Short Term Goals - 03/27/14 1725    PT SHORT TERM GOAL #1   Title demonstrates updated HEP correctly (Target Date: 04/26/14)   Time 1   Period Months   Status New   PT SHORT TERM GOAL #2   Title reaches 4" in standing with RW support with supervision (Target Date: 04/26/14)   Time 1   Period Months   Status New   PT SHORT TERM GOAL #3   Title knee extension in standing with RW support -30 degrees bilaterally. (  Target Date: 04/26/14)   Time 1   Period Months   Status New           PT Long Term Goals - 03/27/14 1727    PT LONG TERM GOAL #1   Title verbalizes ongoing progressive HEP including fitness program at fitness club. (Target Date: 05/26/14)   Time 2   Period Months   Status New   PT LONG TERM GOAL #2   Title reports pain with gait /standing in hamstrings </= 2/10 (Target Date: 05/26/14)   Time 2   Period Months   Status New   PT LONG TERM GOAL #3   Title reaches 6" in standing with RW support safely. (Target Date: 05/26/14)   Time 2   Period Months   Status New   PT LONG TERM GOAL #4   Title ambulates 300' with RW safely  with supervision. (Target Date: 05/26/14)   Time 2   Period Months   Status New   PT LONG TERM GOAL #5   Title Gait Velocity >0.5 ft/sec (Target Date: 05/26/14)   Time 2   Period Months               Plan - 04/03/14 1452    Clinical Impression Statement In addition to severe spasticity of the quads and hip adductors, pt has notable core weakness making balance in long sitting a challenge. Pt will continue to benefit from prolonged stretching of quads, hamstrings and hip adductors, strengthening of quads, standing tolerance activities/weight bearing, and core strengthening and balance. Continue per POC.   PT Next Visit Plan Standing tolerance/weight bearing through BLE, saddle sitting on peanut ball with weight shifting.         Problem List Patient Active Problem List   Diagnosis Date Noted  . Spastic tetraplegia 12/06/2013  . Hematuria 11/05/2013  . Contact dermatitis 05/12/2013  . Rash of hands 05/12/2013  . Fatigue 11/16/2012  . Anemia 11/16/2012  . History of recurrent UTIs 05/19/2010  . Functional quadriplegia 05/10/2009  . HEMATURIA 07/29/2007  . Closed fracture of cervical vertebra 07/29/2007    Delrae Sawyers D 04/03/2014, 2:58 PM  [facility] [address] [place], [place], [address] Phone: [phone]   Fax:  [phone]

## 2014-04-05 ENCOUNTER — Encounter: Payer: Self-pay | Admitting: Physical Therapy

## 2014-04-05 ENCOUNTER — Ambulatory Visit: Payer: Medicare HMO | Admitting: Physical Therapy

## 2014-04-05 DIAGNOSIS — R269 Unspecified abnormalities of gait and mobility: Secondary | ICD-10-CM

## 2014-04-05 DIAGNOSIS — G8250 Quadriplegia, unspecified: Secondary | ICD-10-CM

## 2014-04-05 DIAGNOSIS — M6289 Other specified disorders of muscle: Secondary | ICD-10-CM

## 2014-04-05 DIAGNOSIS — R6889 Other general symptoms and signs: Secondary | ICD-10-CM | POA: Diagnosis not present

## 2014-04-05 DIAGNOSIS — IMO0001 Reserved for inherently not codable concepts without codable children: Secondary | ICD-10-CM

## 2014-04-05 DIAGNOSIS — M6249 Contracture of muscle, multiple sites: Secondary | ICD-10-CM | POA: Diagnosis not present

## 2014-04-05 NOTE — Therapy (Signed)
[facility] [address] [place], [place], [address] Phone: [phone]   Fax:  [phone]  Physical Therapy Treatment  Patient Details  Name: Andrew Sullivan MRN: [id number] Date of Birth: 19-Jun-1970 Referring Provider:  Timmothy Euler, MD  Encounter Date: 04/05/2014      PT End of Session - 04/05/14 1322    Visit Number 4   Number of Visits 18   Date for PT Re-Evaluation 05/26/14   PT Start Time 1316   PT Stop Time 1355   PT Time Calculation (min) 39 min   Activity Tolerance Patient tolerated treatment well   Behavior During Therapy [facility] for tasks assessed/performed      Past Medical History  Diagnosis Date  . Paraparesis of both lower limbs     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 80 in 53  . History of recurrent UTIs   . Erectile dysfunction   . Gross hematuria   . History of spinal cord injury     94 (age 7 fell from ladder) w/ cervical spine fx  s/p  c4 -- c6  fusion--  residual paraplegic bilateral legs (great below T8 uses w/c and can transfer self  . Spastic tetraplegia     Past Surgical History  Procedure Laterality Date  . Cervical fusion  1988    C4 -- C6 w/ spinal cord injury  . Hip arthroplasty  08/12/2011    Procedure: ARTHROPLASTY BIPOLAR HIP;  Surgeon: Mauri Pole, MD;  Location: [facility];  Service: Orthopedics;  Laterality: Left;  Hemi-arthroplasty  . Cystoscopy with retrograde pyelogram, ureteroscopy and stent placement Left 12/27/2013    Procedure: [place], URETEROSCOPY AND STENT PLACEMENT;  Surgeon: Reece Packer, MD;  Location: [facility];  Service: Urology;  Laterality: Left;  . Cystoscopy/retrograde/ureteroscopy Left 01/09/2014    Procedure: CYSTOSCOPY/RETROGRADE/URETEROSCOPY, URETERAL BIOPSY AND STENT EXCHANGE;  Surgeon: Alexis Frock, MD;  Location: [facility];  Service: Urology;  Laterality: Left;    There were no  vitals taken for this visit.  Visit Diagnosis:  Abnormality of gait  Activity intolerance  Quadriparesis (muscle weakness)  Muscle hypertonicity      Subjective Assessment - 04/05/14 1320    Symptoms No new complaints. No falls. Some pain in bil hamstrings behind knees.   Currently in Pain? Yes   Pain Score 3    Pain Location Knee  hamstrings   Pain Orientation Right;Left   Pain Descriptors / Indicators Throbbing   Pain Type Chronic pain   Pain Onset More than a month ago   Pain Frequency Intermittent   Aggravating Factors  walking    Pain Relieving Factors sit down/resting     Treatment: stretches seated edge of mat - hamstring stretch with foot on chair and overpressure at knee for extension and ankle for DF. Prolonged hold each leg (3-4 minutes) - hip adductor stretch with foot resting on PTA leg, manual assist to maintain knee extension and gradual increase in stretch as spasticity relaxed. Prolonged hold of 3-4 minutes each leg. - with pt in semireclining position (inverted chair with pillow) bil quad stretch with feet over edge of mat x 1 minute hold each side.   Neuro Re-ed: - modified sit up with inverted chair on mat x 10 reps with cues on neural position/decreased trunk rotation with return to sitting and assist to stabilize at legs. Cues on use of breath to assist with movements.  - lateral bends for elbow  taps to mat and return to sitting x 10 each side with assist for return and to maintain upright posture.  - isometric perturbations in all directions with cues/ assist for upright/neutral position and to hold this position/resisit the movement/force being applied for core activation and strengthening.         PT Short Term Goals - 03/27/14 1725    PT SHORT TERM GOAL #1   Title demonstrates updated HEP correctly (Target Date: 04/26/14)   Time 1   Period Months   Status New   PT SHORT TERM GOAL #2   Title reaches 4" in standing with RW support with  supervision (Target Date: 04/26/14)   Time 1   Period Months   Status New   PT SHORT TERM GOAL #3   Title knee extension in standing with RW support -30 degrees bilaterally. (Target Date: 04/26/14)   Time 1   Period Months   Status New           PT Long Term Goals - 03/27/14 1727    PT LONG TERM GOAL #1   Title verbalizes ongoing progressive HEP including fitness program at fitness club. (Target Date: 05/26/14)   Time 2   Period Months   Status New   PT LONG TERM GOAL #2   Title reports pain with gait /standing in hamstrings </= 2/10 (Target Date: 05/26/14)   Time 2   Period Months   Status New   PT LONG TERM GOAL #3   Title reaches 6" in standing with RW support safely. (Target Date: 05/26/14)   Time 2   Period Months   Status New   PT LONG TERM GOAL #4   Title ambulates 300' with RW safely with supervision. (Target Date: 05/26/14)   Time 2   Period Months   Status New   PT LONG TERM GOAL #5   Title Gait Velocity >0.5 ft/sec (Target Date: 05/26/14)   Time 2   Period Months           Plan - 04/05/14 1322    Clinical Impression Statement Continued with lower limb stretching of the hamstrings, adductors and quads today. Incorportated seated trunk strengthening without issues.   Pt will benefit from skilled therapeutic intervention in order to improve on the following deficits Abnormal gait;Decreased activity tolerance;Decreased balance;Decreased mobility;Decreased knowledge of use of DME;Decreased range of motion;Decreased strength;Impaired tone   Rehab Potential Good   Clinical Impairments Affecting Rehab Potential time since injury   PT Frequency 2x / week   PT Duration Other (comment)  9 weeks (2 months)   PT Treatment/Interventions ADLs/Self Care Home Management;Gait training;DME Instruction;Stair training;Functional mobility training;Therapeutic activities;Therapeutic exercise;Balance training;Neuromuscular re-education;Patient/family education;Manual techniques    PT Next Visit Plan Continue stretching, core strenthening and begin standing weight bearing activites.   Consulted and Agree with Plan of Care Patient        Problem List Patient Active Problem List   Diagnosis Date Noted  . Spastic tetraplegia 12/06/2013  . Hematuria 11/05/2013  . Contact dermatitis 05/12/2013  . Rash of hands 05/12/2013  . Fatigue 11/16/2012  . Anemia 11/16/2012  . History of recurrent UTIs 05/19/2010  . Functional quadriplegia 05/10/2009  . HEMATURIA 07/29/2007  . Closed fracture of cervical vertebra 07/29/2007    Willow Ora 04/05/2014, 3:11 PM  Willow Ora, PTA, [facility] [address] [place], [place] [address] [phone] 04/05/2014, 3:11 PM

## 2014-04-10 ENCOUNTER — Ambulatory Visit: Payer: Commercial Managed Care - HMO | Attending: Physical Medicine & Rehabilitation | Admitting: Physical Therapy

## 2014-04-10 ENCOUNTER — Encounter: Payer: Self-pay | Admitting: Physical Therapy

## 2014-04-10 ENCOUNTER — Encounter: Payer: Self-pay | Admitting: Family Medicine

## 2014-04-10 DIAGNOSIS — IMO0001 Reserved for inherently not codable concepts without codable children: Secondary | ICD-10-CM

## 2014-04-10 DIAGNOSIS — R269 Unspecified abnormalities of gait and mobility: Secondary | ICD-10-CM | POA: Insufficient documentation

## 2014-04-10 DIAGNOSIS — G8250 Quadriplegia, unspecified: Secondary | ICD-10-CM | POA: Insufficient documentation

## 2014-04-10 DIAGNOSIS — R6889 Other general symptoms and signs: Secondary | ICD-10-CM | POA: Insufficient documentation

## 2014-04-10 DIAGNOSIS — M6249 Contracture of muscle, multiple sites: Secondary | ICD-10-CM | POA: Diagnosis not present

## 2014-04-10 DIAGNOSIS — M6289 Other specified disorders of muscle: Secondary | ICD-10-CM

## 2014-04-10 NOTE — Progress Notes (Signed)
Pt dropped off handicap placard to be completed, given to red team for any clinical completion

## 2014-04-10 NOTE — Therapy (Signed)
[facility] [address] [place], [place], [address] Phone: [phone]   Fax:  [phone]  Physical Therapy Treatment  Patient Details  Name: Andrew Sullivan MRN: [id number] Date of Birth: 1970/04/22 Referring Provider:  Timmothy Euler, MD  Encounter Date: 04/10/2014      PT End of Session - 04/10/14 1315    Visit Number 5   Number of Visits 18   Date for PT Re-Evaluation 05/26/14   PT Start Time 1310   PT Stop Time 1400   PT Time Calculation (min) 50 min   Equipment Utilized During Treatment Gait belt   Activity Tolerance Patient tolerated treatment well   Behavior During Therapy [facility] for tasks assessed/performed      Past Medical History  Diagnosis Date  . Paraparesis of both lower limbs     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 44 in 22  . History of recurrent UTIs   . Erectile dysfunction   . Gross hematuria   . History of spinal cord injury     58 (age 80 fell from ladder) w/ cervical spine fx  s/p  c4 -- c6  fusion--  residual paraplegic bilateral legs (great below T8 uses w/c and can transfer self  . Spastic tetraplegia     Past Surgical History  Procedure Laterality Date  . Cervical fusion  1988    C4 -- C6 w/ spinal cord injury  . Hip arthroplasty  08/12/2011    Procedure: ARTHROPLASTY BIPOLAR HIP;  Surgeon: Mauri Pole, MD;  Location: [facility];  Service: Orthopedics;  Laterality: Left;  Hemi-arthroplasty  . Cystoscopy with retrograde pyelogram, ureteroscopy and stent placement Left 12/27/2013    Procedure: [place], URETEROSCOPY AND STENT PLACEMENT;  Surgeon: Reece Packer, MD;  Location: [facility];  Service: Urology;  Laterality: Left;  . Cystoscopy/retrograde/ureteroscopy Left 01/09/2014    Procedure: CYSTOSCOPY/RETROGRADE/URETEROSCOPY, URETERAL BIOPSY AND STENT EXCHANGE;  Surgeon: Alexis Frock, MD;  Location: [facility];  Service:  Urology;  Laterality: Left;    There were no vitals taken for this visit.  Visit Diagnosis:  Abnormality of gait  Activity intolerance  Quadriparesis (muscle weakness)  Muscle hypertonicity      Subjective Assessment - 04/10/14 1318    Symptoms Botox and stretches are helping with knees being more upright.    Currently in Pain? Yes   Pain Score 3    Pain Location Knee   Pain Orientation Right;Left   Pain Descriptors / Indicators Throbbing   Pain Type Chronic pain   Pain Onset More than a month ago   Pain Frequency Intermittent   Aggravating Factors  walking   Pain Relieving Factors sit down / resting   Multiple Pain Sites No                    [facility] Adult PT Treatment/Exercise - 04/10/14 1310    Ambulation/Gait   Ambulation/Gait Yes   Ambulation/Gait Assistance 4: Min guard   Ambulation Distance (Feet) 200 Feet   Assistive device Rolling walker   Gait Pattern Step-through pattern;Decreased step length - right;Decreased step length - left;Decreased stride length;Decreased hip/knee flexion - right;Decreased hip/knee flexion - left;Left hip hike;Right hip hike;Right flexed knee in stance;Left flexed knee in stance;Decreased trunk rotation;Trunk flexed;Poor foot clearance - left;Poor foot clearance - right   Ambulation Surface Level;Indoor   Gait Comments visual & verbal cues on step length to increase with heel strke past toe of  stance limb.   Lumbar Exercises: Seated   Other Seated Lumbar Exercises side lean R &L with recovery, rotation R & L, forward lean with recovery, abdmoninals & obliques using inverted chair   Knee/Hip Exercises: Stretches   Passive Hamstring Stretch 2 reps;30 seconds  uses looped sheet with combo heelcord Scientist, research (physical sciences) 2 reps;30 seconds  seated on mat with 2nd chair for modified 1/2 kneel Clinical research associate Limitations PT manually assisting hip extension    Knee/Hip Exercises: Standing   Forward Step Up Both;5  reps;Hand Hold: 2;Step Height: 4"  RW   Forward Step Up Limitations PT manually & verbally facilitating LE>UE use   Step Down Both;5 reps;Hand Hold: 2;Step Height: 4"  RW support   Step Down Limitations PT manually & verbally facilitating LE>UE [place]     Modified Downward Dog Yoga position leaning upper body /forehead on counter with PT facilitating hamstring stretch / knee extension.           PT Education - 04/10/14 1517    Education provided Yes   Education Details PT instructed in [place] option as possibility to use [employer] that would have recumbent machines that use both UEs & LEs,    Person(s) Educated Patient   Methods Explanation   Comprehension Verbalized understanding          PT Short Term Goals - 03/27/14 1725    PT SHORT TERM GOAL #1   Title demonstrates updated HEP correctly (Target Date: 04/26/14)   Time 1   Period Months   Status New   PT SHORT TERM GOAL #2   Title reaches 4" in standing with RW support with supervision (Target Date: 04/26/14)   Time 1   Period Months   Status New   PT SHORT TERM GOAL #3   Title knee extension in standing with RW support -30 degrees bilaterally. (Target Date: 04/26/14)   Time 1   Period Months   Status New           PT Long Term Goals - 03/27/14 1727    PT LONG TERM GOAL #1   Title verbalizes ongoing progressive HEP including fitness program at fitness club. (Target Date: 05/26/14)   Time 2   Period Months   Status New   PT LONG TERM GOAL #2   Title reports pain with gait /standing in hamstrings </= 2/10 (Target Date: 05/26/14)   Time 2   Period Months   Status New   PT LONG TERM GOAL #3   Title reaches 6" in standing with RW support safely. (Target Date: 05/26/14)   Time 2   Period Months   Status New   PT LONG TERM GOAL #4   Title ambulates 300' with RW safely with supervision. (Target Date: 05/26/14)   Time 2   Period Months   Status New   PT LONG TERM GOAL #5   Title Gait Velocity >0.5 ft/sec  (Target Date: 05/26/14)   Time 2   Period Months               Plan - 04/10/14 1315    Clinical Impression Statement Patient was challenged by gait with longer stride length but improved gait with less knee flexion in stance and less tone for last 50' of 200' gait. Patient was challenged by seated trunk exercises.   Pt will benefit from skilled therapeutic intervention in order to improve on the following deficits Abnormal gait;Decreased activity tolerance;Decreased balance;Decreased  mobility;Decreased knowledge of use of DME;Decreased range of motion;Decreased strength;Impaired tone   Rehab Potential Good   Clinical Impairments Affecting Rehab Potential time since injury   PT Frequency 2x / week   PT Duration Other (comment)  9 weeks (2 months)   PT Treatment/Interventions ADLs/Self Care Home Management;Gait training;DME Instruction;Stair training;Functional mobility training;Therapeutic activities;Therapeutic exercise;Balance training;Neuromuscular re-education;Patient/family education;Manual techniques   PT Next Visit Plan Continue stretching, core strenthening and begin standing weight bearing activites.   Consulted and Agree with Plan of Care Patient        Problem List Patient Active Problem List   Diagnosis Date Noted  . Spastic tetraplegia 12/06/2013  . Hematuria 11/05/2013  . Contact dermatitis 05/12/2013  . Rash of hands 05/12/2013  . Fatigue 11/16/2012  . Anemia 11/16/2012  . History of recurrent UTIs 05/19/2010  . Functional quadriplegia 05/10/2009  . HEMATURIA 07/29/2007  . Closed fracture of cervical vertebra 07/29/2007    Siddhartha Hoback PT, DPT 04/10/2014, 3:23 PM  [facility] [address] [place], [place], [address] Phone: [phone]   Fax:  [phone]

## 2014-04-11 ENCOUNTER — Telehealth: Payer: Self-pay | Admitting: Family Medicine

## 2014-04-11 NOTE — Progress Notes (Signed)
Placed in MDs Box. Deseree Blount, CMA  

## 2014-04-11 NOTE — Telephone Encounter (Signed)
Handicap placard form received and filled out, placed in RN's box.   Laroy Apple, MD [place] Resident, PGY-3 04/11/2014, 12:09 PM

## 2014-04-12 ENCOUNTER — Ambulatory Visit: Payer: Commercial Managed Care - HMO | Admitting: Physical Therapy

## 2014-04-12 ENCOUNTER — Encounter: Payer: Self-pay | Admitting: Physical Therapy

## 2014-04-12 DIAGNOSIS — R269 Unspecified abnormalities of gait and mobility: Secondary | ICD-10-CM

## 2014-04-12 DIAGNOSIS — IMO0001 Reserved for inherently not codable concepts without codable children: Secondary | ICD-10-CM

## 2014-04-12 DIAGNOSIS — G8250 Quadriplegia, unspecified: Secondary | ICD-10-CM | POA: Diagnosis not present

## 2014-04-12 DIAGNOSIS — R6889 Other general symptoms and signs: Secondary | ICD-10-CM | POA: Diagnosis not present

## 2014-04-12 DIAGNOSIS — M6289 Other specified disorders of muscle: Secondary | ICD-10-CM

## 2014-04-12 DIAGNOSIS — M6249 Contracture of muscle, multiple sites: Secondary | ICD-10-CM | POA: Diagnosis not present

## 2014-04-12 NOTE — Telephone Encounter (Signed)
Left voice message informing pt that form is ready for pick up.  Derl Barrow, RN

## 2014-04-12 NOTE — Therapy (Signed)
[facility] [address] [place], [place], [address] Phone: [phone]   Fax:  [phone]  Physical Therapy Treatment  Patient Details  Name: Andrew Sullivan MRN: [id number] Date of Birth: 12/08/1970 Referring Provider:  Timmothy Euler, MD  Encounter Date: 04/12/2014      PT End of Session - 04/12/14 1254    Visit Number 6   Number of Visits 18   Date for PT Re-Evaluation 05/26/14   PT Start Time 6962   PT end time 1315   Total time 26 minutes   Equipment Utilized During Treatment Gait belt   Activity Tolerance Patient tolerated treatment well   Behavior During Therapy [facility] for tasks assessed/performed      Past Medical History  Diagnosis Date  . Paraparesis of both lower limbs     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 45 in 32  . History of recurrent UTIs   . Erectile dysfunction   . Gross hematuria   . History of spinal cord injury     68 (age 68 fell from ladder) w/ cervical spine fx  s/p  c4 -- c6  fusion--  residual paraplegic bilateral legs (great below T8 uses w/c and can transfer self  . Spastic tetraplegia     Past Surgical History  Procedure Laterality Date  . Cervical fusion  1988    C4 -- C6 w/ spinal cord injury  . Hip arthroplasty  08/12/2011    Procedure: ARTHROPLASTY BIPOLAR HIP;  Surgeon: Mauri Pole, MD;  [facility];  Service: Orthopedics;  Laterality: Left;  Hemi-arthroplasty  . Cystoscopy with retrograde pyelogram, ureteroscopy and stent placement Left 12/27/2013    Procedure: [place], URETEROSCOPY AND STENT PLACEMENT;  Surgeon: Reece Packer, MD;  Location: [facility];  Service: Urology;  Laterality: Left;  . Cystoscopy/retrograde/ureteroscopy Left 01/09/2014    Procedure: CYSTOSCOPY/RETROGRADE/URETEROSCOPY, URETERAL BIOPSY AND STENT EXCHANGE;  Surgeon: Alexis Frock, MD;  [facility];  Service: Urology;   Laterality: Left;    There were no vitals taken for this visit.  Visit Diagnosis:  Abnormality of gait  Activity intolerance  Quadriparesis (muscle weakness)  Muscle hypertonicity      Subjective Assessment - 04/12/14 1253    Symptoms No new complaints. No falls or pain to report.   Currently in Pain? No/denies   Pain Score 0-No pain     Treatment: Gait: - 110 feet with RW and min guard assist. Cues on posture and stride length. Cues on increased knee extension bil legs with stance and gait as well.   Neuro Re-ed: - seated core strengthening- partial sit ups with bolster behind back 2 sets of 10 reps. Sitting up tall: elbow taps to mat and up x 10 each side with emphasis on maintaining upright posture and not letting torso "curl" back; seated with tall posture upper trunk rotation left/right x 10 each way.   - standing with chair support:wieght shifting laterally with tall posture and knee extension x 10 each way; upper trunk rotation with tall posture and knee extension x 5 each way; heel raises x 10 reps; mini squats x 10 reps; with tall posture and knee extension alternating UE raises x 10 each arm;        PT Short Term Goals - 03/27/14 1725    PT SHORT TERM GOAL #1   Title demonstrates updated HEP correctly (Target Date: 04/26/14)   Time 1   Period Months  Status New   PT SHORT TERM GOAL #2   Title reaches 4" in standing with RW support with supervision (Target Date: 04/26/14)   Time 1   Period Months   Status New   PT SHORT TERM GOAL #3   Title knee extension in standing with RW support -30 degrees bilaterally. (Target Date: 04/26/14)   Time 1   Period Months   Status New           PT Long Term Goals - 03/27/14 1727    PT LONG TERM GOAL #1   Title verbalizes ongoing progressive HEP including fitness program at fitness club. (Target Date: 05/26/14)   Time 2   Period Months   Status New   PT LONG TERM GOAL #2   Title reports pain with gait /standing in  hamstrings </= 2/10 (Target Date: 05/26/14)   Time 2   Period Months   Status New   PT LONG TERM GOAL #3   Title reaches 6" in standing with RW support safely. (Target Date: 05/26/14)   Time 2   Period Months   Status New   PT LONG TERM GOAL #4   Title ambulates 300' with RW safely with supervision. (Target Date: 05/26/14)   Time 2   Period Months   Status New   PT LONG TERM GOAL #5   Title Gait Velocity >0.5 ft/sec (Target Date: 05/26/14)   Time 2   Period Months          Plan - 04/12/14 1254    PT clinical impression statement Continued to focus on longer stride with gait and core stability/strengthening today with out issues. shorter treatment session due to pt late for appt.   Pt will benefit from skilled therapeutic intervention in order to improve on the following deficits Abnormal gait;Decreased activity tolerance;Decreased balance;Decreased mobility;Decreased knowledge of use of DME;Decreased range of motion;Decreased strength;Impaired tone   Rehab Potential Good   Clinical Impairments Affecting Rehab Potential time since injury   PT Frequency 2x / week   PT Duration Other (comment)  9 weeks (2 months)   PT Treatment/Interventions ADLs/Self Care Home Management;Gait training;DME Instruction;Stair training;Functional mobility training;Therapeutic activities;Therapeutic exercise;Balance training;Neuromuscular re-education;Patient/family education;Manual techniques   PT Next Visit Plan Continue stretching, core strenthening and begin standing weight bearing activites.   Consulted and Agree with Plan of Care Patient      Problem List Patient Active Problem List   Diagnosis Date Noted  . Spastic tetraplegia 12/06/2013  . Hematuria 11/05/2013  . Contact dermatitis 05/12/2013  . Rash of hands 05/12/2013  . Fatigue 11/16/2012  . Anemia 11/16/2012  . History of recurrent UTIs 05/19/2010  . Functional quadriplegia 05/10/2009  . HEMATURIA 07/29/2007  . Closed fracture of  cervical vertebra 07/29/2007    Willow Ora 04/12/2014, 12:55 PM  Willow Ora, PTA, [facility] [address] [place], [place] [address] [phone] 04/14/2014, 9:22 AM

## 2014-04-17 ENCOUNTER — Encounter: Payer: Self-pay | Admitting: Physical Therapy

## 2014-04-17 ENCOUNTER — Ambulatory Visit: Payer: Commercial Managed Care - HMO | Admitting: Physical Therapy

## 2014-04-17 DIAGNOSIS — G8250 Quadriplegia, unspecified: Secondary | ICD-10-CM

## 2014-04-17 DIAGNOSIS — R6889 Other general symptoms and signs: Secondary | ICD-10-CM | POA: Diagnosis not present

## 2014-04-17 DIAGNOSIS — M6249 Contracture of muscle, multiple sites: Secondary | ICD-10-CM | POA: Diagnosis not present

## 2014-04-17 DIAGNOSIS — M6289 Other specified disorders of muscle: Secondary | ICD-10-CM

## 2014-04-17 DIAGNOSIS — R269 Unspecified abnormalities of gait and mobility: Secondary | ICD-10-CM | POA: Diagnosis not present

## 2014-04-17 DIAGNOSIS — IMO0001 Reserved for inherently not codable concepts without codable children: Secondary | ICD-10-CM

## 2014-04-17 NOTE — Therapy (Signed)
[facility] [address] [place], [place], [address] Phone: [phone]   Fax:  [phone]  Physical Therapy Treatment  Patient Details  Name: Andrew Sullivan MRN: [id number] Date of Birth: Aug 08, 1970 Referring Provider:  Timmothy Euler, MD  Encounter Date: 04/17/2014      PT End of Session - 04/17/14 1230    Visit Number 7   Number of Visits 18   Date for PT Re-Evaluation 05/26/14   PT Start Time 1230   PT Stop Time 1315   PT Time Calculation (min) 45 min   Equipment Utilized During Treatment Gait belt   Activity Tolerance Patient tolerated treatment well   Behavior During Therapy [facility] for tasks assessed/performed      Past Medical History  Diagnosis Date  . Paraparesis of both lower limbs     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 2 in 37  . History of recurrent UTIs   . Erectile dysfunction   . Gross hematuria   . History of spinal cord injury     15 (age 89 fell from ladder) w/ cervical spine fx  s/p  c4 -- c6  fusion--  residual paraplegic bilateral legs (great below T8 uses w/c and can transfer self  . Spastic tetraplegia     Past Surgical History  Procedure Laterality Date  . Cervical fusion  1988    C4 -- C6 w/ spinal cord injury  . Hip arthroplasty  08/12/2011    Procedure: ARTHROPLASTY BIPOLAR HIP;  Surgeon: Mauri Pole, MD;  [facility];  Service: Orthopedics;  Laterality: Left;  Hemi-arthroplasty  . Cystoscopy with retrograde pyelogram, ureteroscopy and stent placement Left 12/27/2013    Procedure: [place], URETEROSCOPY AND STENT PLACEMENT;  Surgeon: Reece Packer, MD;  Location: [facility];  Service: Urology;  Laterality: Left;  . Cystoscopy/retrograde/ureteroscopy Left 01/09/2014    Procedure: CYSTOSCOPY/RETROGRADE/URETEROSCOPY, URETERAL BIOPSY AND STENT EXCHANGE;  Surgeon: Alexis Frock, MD;  [facility];  Service:  Urology;  Laterality: Left;    There were no vitals taken for this visit.  Visit Diagnosis:  Abnormality of gait  Activity intolerance  Quadriparesis (muscle weakness)  Muscle hypertonicity      Subjective Assessment - 04/17/14 1326    Symptoms He reports able to exercise seated with some resistance but unable to work against gravity.   Currently in Pain? No/denies      Therapeutic Exercise: See patient education.   Standing with RW support: step forward position (heel 2-4" past toe) small range squat with pushing upward facilitating hip /knee extension 5 reps each way until LEs fatigued,  Sit to stand using UEs with cues on increasing hip /knee extension with stabilization,  Standing heel raises bilaterally with UE support (minimal clearance of heels - more from anterior shift than strength but firing of gastroc muscle)  Seated with knee extended red theraband gastroc single leg 10 reps per leg  Seated with LE on rolling stool: AAROM hip abduction 10 reps limited ROM  Seated with LE on stool to support LE with foot against wall for closed chain: terminal knee extension 10 reps per leg  Seated on 24" bar stool without UE support with minimal assist /gaurd & near counter top for safety: AROM of trunk- forward lean with recovery, back lean with recovery, trunk rotation, lateral trunk lean,   Sit to/from stand with counter to stabilize (followed by contraction of gastroc)  Gait Training: Patient ambulated 100'  with rolling walker from lobby to clinic with flexed trunk with excessive hip & knee flexion with step length heel clearing toe (not quite to <1").  At end of session patient ambulated 125' with rolling walker from clinic to lobby with improved knee & hip extension and step length >3"                       PT Education - 04/17/14 1327    Education provided Yes   Education Details open vs closed chain exercises,    Person(s) Educated Patient   Methods  Explanation;Demonstration   Comprehension Verbalized understanding;Need further instruction          PT Short Term Goals - 03/27/14 1725    PT SHORT TERM GOAL #1   Title demonstrates updated HEP correctly (Target Date: 04/26/14)   Time 1   Period Months   Status New   PT SHORT TERM GOAL #2   Title reaches 4" in standing with RW support with supervision (Target Date: 04/26/14)   Time 1   Period Months   Status New   PT SHORT TERM GOAL #3   Title knee extension in standing with RW support -30 degrees bilaterally. (Target Date: 04/26/14)   Time 1   Period Months   Status New           PT Long Term Goals - 03/27/14 1727    PT LONG TERM GOAL #1   Title verbalizes ongoing progressive HEP including fitness program at fitness club. (Target Date: 05/26/14)   Time 2   Period Months   Status New   PT LONG TERM GOAL #2   Title reports pain with gait /standing in hamstrings </= 2/10 (Target Date: 05/26/14)   Time 2   Period Months   Status New   PT LONG TERM GOAL #3   Title reaches 6" in standing with RW support safely. (Target Date: 05/26/14)   Time 2   Period Months   Status New   PT LONG TERM GOAL #4   Title ambulates 300' with RW safely with supervision. (Target Date: 05/26/14)   Time 2   Period Months   Status New   PT LONG TERM GOAL #5   Title Gait Velocity >0.5 ft/sec (Target Date: 05/26/14)   Time 2   Period Months               Plan - 04/17/14 1230    Clinical Impression Statement Patient is challenge by standing exercises against gravity but tolerated seated closed chain activities and sitting on 24" stool (modified standing position that does not require UE assist) active trunk exercises. His step length improved exiting session compared to entering session.   Pt will benefit from skilled therapeutic intervention in order to improve on the following deficits Abnormal gait;Decreased activity tolerance;Decreased balance;Decreased mobility;Decreased knowledge of  use of DME;Decreased range of motion;Decreased strength;Impaired tone   Rehab Potential Good   Clinical Impairments Affecting Rehab Potential time since injury   PT Frequency 2x / week   PT Duration Other (comment)  9 weeks (2 months)   PT Treatment/Interventions ADLs/Self Care Home Management;Gait training;DME Instruction;Stair training;Functional mobility training;Therapeutic activities;Therapeutic exercise;Balance training;Neuromuscular re-education;Patient/family education;Manual techniques   PT Next Visit Plan Continue stretching, core strenthening and begin standing weight bearing activites.   Consulted and Agree with Plan of Care Patient        Problem List Patient Active Problem List   Diagnosis Date Noted  .  Spastic tetraplegia 12/06/2013  . Hematuria 11/05/2013  . Contact dermatitis 05/12/2013  . Rash of hands 05/12/2013  . Fatigue 11/16/2012  . Anemia 11/16/2012  . History of recurrent UTIs 05/19/2010  . Functional quadriplegia 05/10/2009  . HEMATURIA 07/29/2007  . Closed fracture of cervical vertebra 07/29/2007    Kebin Maye PT, DPT 04/17/2014, 3:22 PM  [facility] [address] [place], [place], [address] Phone: [phone]   Fax:  [phone]

## 2014-04-19 ENCOUNTER — Ambulatory Visit: Payer: Commercial Managed Care - HMO | Admitting: Physical Therapy

## 2014-04-19 ENCOUNTER — Encounter: Payer: Self-pay | Admitting: Physical Therapy

## 2014-04-19 DIAGNOSIS — M6249 Contracture of muscle, multiple sites: Secondary | ICD-10-CM | POA: Diagnosis not present

## 2014-04-19 DIAGNOSIS — G8250 Quadriplegia, unspecified: Secondary | ICD-10-CM | POA: Diagnosis not present

## 2014-04-19 DIAGNOSIS — R6889 Other general symptoms and signs: Secondary | ICD-10-CM

## 2014-04-19 DIAGNOSIS — M6289 Other specified disorders of muscle: Secondary | ICD-10-CM

## 2014-04-19 DIAGNOSIS — IMO0001 Reserved for inherently not codable concepts without codable children: Secondary | ICD-10-CM

## 2014-04-19 DIAGNOSIS — R269 Unspecified abnormalities of gait and mobility: Secondary | ICD-10-CM

## 2014-04-19 NOTE — Therapy (Signed)
[facility] [address] [place], [place], [address] Phone: [phone]   Fax:  [phone]  Physical Therapy Treatment  Patient Details  Name: Andrew Sullivan MRN: [id number] Date of Birth: [birth year] Referring Provider:  Timmothy Euler, MD  Encounter Date: 04/19/2014      PT End of Session - 04/19/14 1326    Visit Number 8   Number of Visits 18   Date for PT Re-Evaluation 05/26/14   PT Start Time 1316   PT Stop Time 1358   PT Time Calculation (min) 42 min   Equipment Utilized During Treatment Gait belt   Activity Tolerance Patient tolerated treatment well   Behavior During Therapy [facility] for tasks assessed/performed      Past Medical History  Diagnosis Date  . Paraparesis of both lower limbs     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 44 in 57  . History of recurrent UTIs   . Erectile dysfunction   . Gross hematuria   . History of spinal cord injury     44 (age 44 fell from ladder) w/ cervical spine fx  s/p  c4 -- c6  fusion--  residual paraplegic bilateral legs (great below T8 uses w/c and can transfer self  ) (great below T8 uses w/c and can transfer self  . Spastic tetraplegia     Past Surgical History  Procedure Laterality Date  . Cervical fusion  1988    C4 -- C6 w/ spinal cord injury  . Hip arthroplasty  08/12/2011    Procedure: ARTHROPLASTY BIPOLAR HIP;  Surgeon: Mauri Pole, MD;  Location: [facility];  Service: Orthopedics;  Laterality: Left;  Hemi-arthroplasty  . Cystoscopy with retrograde pyelogram, ureteroscopy and stent placement Left 12/27/2013    Procedure: [place], URETEROSCOPY AND STENT PLACEMENT;  Surgeon: Reece Packer, MD;  Location: [facility];  Service: Urology;  Laterality: Left;  . Cystoscopy/retrograde/ureteroscopy Left 01/09/2014    Procedure: CYSTOSCOPY/RETROGRADE/URETEROSCOPY, URETERAL BIOPSY AND STENT EXCHANGE;  Surgeon: Alexis Frock, MD;  Location: [facility];  Service:  Urology;  Laterality: Left;    There were no vitals taken for this visit.  Visit Diagnosis:  Abnormality of gait  Activity intolerance  Quadriparesis (muscle weakness)  Muscle hypertonicity      Subjective Assessment - 04/19/14 1322    Symptoms No falls. Some pain behind his thighs/knees. Pain decreases with stretching. Doing HEP at home without issues.   Currently in Pain? Yes   Pain Score 3    Pain Location Knee   Pain Orientation Right;Left   Pain Descriptors / Indicators Tightness;Sore   Pain Type Chronic pain   Pain Onset More than a month ago   Pain Frequency Intermittent   Aggravating Factors  walking   Pain Relieving Factors resting/stretching     Treatment: Exercise Seated edge of mat - passive stretching to bil hamstrings and hip adductors. Prolong stretches with leg on rolling stool active assist from therapist.  - Seated with knee extension: resisted ankle PF assisted DF with red band x 10 each side - sit/stands with UE assist with emphasis on full upright posture with knee/hip extension x 10 reps with min guard assist - standing with RW support: in staggered stance position: mini squats with emphasis on upward pushing to return to full stance with emphasis on knee/hip extension x 8 reps each foot forward - with bolster behind pt: partial sit ups x 10 with emphasis on equal return in midline position x 10 reps - seated with tall posture: elbow  taps laterally to mat x 10 each side with min active assist to return to seated upright position   Gait - 100 feet x 2 with walker with min guard assist to supervision (lobby to gym and then gym to lobby). Cues on increased step length and on posture. Cues on knee/hip extension with gait. Pt with improved hip/knee extension with second gait trial after above exercise.        PT Short Term Goals - 03/27/14 1725    PT SHORT TERM GOAL #1   Title demonstrates updated HEP correctly (Target Date: 04/26/14)   Time 1    Period Months   Status New   PT SHORT TERM GOAL #2   Title reaches 4" in standing with RW support with supervision (Target Date: 04/26/14)   Time 1   Period Months   Status New   PT SHORT TERM GOAL #3   Title knee extension in standing with RW support -30 degrees bilaterally. (Target Date: 04/26/14)   Time 1   Period Months   Status New           PT Long Term Goals - 03/27/14 1727    PT LONG TERM GOAL #1   Title verbalizes ongoing progressive HEP including fitness program at fitness club. (Target Date: 05/26/14)   Time 2   Period Months   Status New   PT LONG TERM GOAL #2   Title reports pain with gait /standing in hamstrings </= 2/10 (Target Date: 05/26/14)   Time 2   Period Months   Status New   PT LONG TERM GOAL #3   Title reaches 6" in standing with RW support safely. (Target Date: 05/26/14)   Time 2   Period Months   Status New   PT LONG TERM GOAL #4   Title ambulates 300' with RW safely with supervision. (Target Date: 05/26/14)   Time 2   Period Months   Status New   PT LONG TERM GOAL #5   Title Gait Velocity >0.5 ft/sec (Target Date: 05/26/14)   Time 2   Period Months           Plan - 04/19/14 1326    Clinical Impression Statement Pt making steady progress toward goals. Demo's improved knee extension with gait after stretching and exercises.   Pt will benefit from skilled therapeutic intervention in order to improve on the following deficits Abnormal gait;Decreased activity tolerance;Decreased balance;Decreased mobility;Decreased knowledge of use of DME;Decreased range of motion;Decreased strength;Impaired tone   Rehab Potential Good   Clinical Impairments Affecting Rehab Potential time since injury   PT Frequency 2x / week   PT Duration Other (comment)  9 weeks (2 months)   PT Treatment/Interventions ADLs/Self Care Home Management;Gait training;DME Instruction;Stair training;Functional mobility training;Therapeutic activities;Therapeutic exercise;Balance  training;Neuromuscular re-education;Patient/family education;Manual techniques   PT Next Visit Plan Continue stretching, core strenthening and begin standing weight bearing activites. Assess STG's next week.   Consulted and Agree with Plan of Care Patient      Problem List Patient Active Problem List   Diagnosis Date Noted  . Spastic tetraplegia 12/06/2013  . Hematuria 11/05/2013  . Contact dermatitis 05/12/2013  . Rash of hands 05/12/2013  . Fatigue 11/16/2012  . Anemia 11/16/2012  . History of recurrent UTIs 05/19/2010  . Functional quadriplegia 05/10/2009  . HEMATURIA 07/29/2007  . Closed fracture of cervical vertebra 07/29/2007    Willow Ora 04/20/2014, 12:08 PM  Willow Ora, PTA, [facility] [address]  [place], [place] [address] [phone] 04/20/2014, 12:08 PM

## 2014-04-24 ENCOUNTER — Ambulatory Visit: Payer: Commercial Managed Care - HMO | Admitting: Physical Therapy

## 2014-04-26 ENCOUNTER — Ambulatory Visit: Payer: Commercial Managed Care - HMO | Admitting: Physical Therapy

## 2014-04-26 DIAGNOSIS — G8250 Quadriplegia, unspecified: Secondary | ICD-10-CM

## 2014-04-26 DIAGNOSIS — R269 Unspecified abnormalities of gait and mobility: Secondary | ICD-10-CM

## 2014-04-26 DIAGNOSIS — M6249 Contracture of muscle, multiple sites: Secondary | ICD-10-CM | POA: Diagnosis not present

## 2014-04-26 DIAGNOSIS — R6889 Other general symptoms and signs: Secondary | ICD-10-CM

## 2014-04-26 DIAGNOSIS — IMO0001 Reserved for inherently not codable concepts without codable children: Secondary | ICD-10-CM

## 2014-04-28 NOTE — Therapy (Signed)
[facility] [address] [place], [place], [address] Phone: [phone]   Fax:  [phone]  Physical Therapy Treatment  Patient Details  Name: Andrew Sullivan MRN: [id number] Date of Birth: 07/10/1970 Referring Provider:  Timmothy Euler, MD  Encounter Date: 04/26/2014     04/26/14 1408  PT Visits / Re-Eval  Visit Number 9  Number of Visits 18  Date for PT Re-Evaluation 05/26/14  PT Time Calculation  PT Start Time 1400  PT Stop Time 1445  PT Time Calculation (min) 45 min  PT - End of Session  Equipment Utilized During Treatment Gait belt  Activity Tolerance Patient tolerated treatment well  Behavior During Therapy [facility] for tasks assessed/performed     Past Medical History  Diagnosis Date  . Paraparesis of both lower limbs     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 27 in 72  . History of recurrent UTIs   . Erectile dysfunction   . Gross hematuria   . History of spinal cord injury     57 (age 37 fell from ladder) w/ cervical spine fx  s/p  c4 -- c6  fusion--  residual paraplegic bilateral legs (great below T8 uses w/c and can transfer self  . Spastic tetraplegia     Past Surgical History  Procedure Laterality Date  . Cervical fusion  1988    C4 -- C6 w/ spinal cord injury  . Hip arthroplasty  08/12/2011    Procedure: ARTHROPLASTY BIPOLAR HIP;  Surgeon: Mauri Pole, MD;  Location: [facility];  Service: Orthopedics;  Laterality: Left;  Hemi-arthroplasty  . Cystoscopy with retrograde pyelogram, ureteroscopy and stent placement Left 12/27/2013    Procedure: [place], URETEROSCOPY AND STENT PLACEMENT;  Surgeon: Reece Packer, MD;  Location: [facility];  Service: Urology;  Laterality: Left;  . Cystoscopy/retrograde/ureteroscopy Left 01/09/2014    Procedure: CYSTOSCOPY/RETROGRADE/URETEROSCOPY, URETERAL BIOPSY AND STENT EXCHANGE;  Surgeon: Alexis Frock, MD;   Location: [facility];  Service: Urology;  Laterality: Left;    There were no vitals taken for this visit.  Visit Diagnosis:  Abnormality of gait  Activity intolerance  Quadriparesis (muscle weakness)  Treatment: Gait - 100 feet x 2 with walker with min guard assist and cues for posture and increased knee extension with gait. Pt continues to show improved knee extension with gait after stretching/exercises  Exercise: - passive stretching to bil hamstrings and hip adductors, prolonged holds with leg on rolling stool resting on therapists legs.  - sit/stands with UE assist, cues on full upright posture with knee/hip extension and maintaining midline position with equal weight on bil legs.  -standing with RW support: in staggered stance position: mini squats with emphasis on full extension of knees/hips with return x 10 reps each foot forward.  Pt able to return demo of current HEP without cues needed. Standing with RW support: pt able to reach 8 inches forward Standing with RW support: right knee extension  -25 degrees, left knee extension - 24 degrees        PT Short Term Goals - 04/28/14 1224    PT SHORT TERM GOAL #1   Title demonstrates updated HEP correctly (Target Date: 04/26/14)   Time 1   Period Months   Status Achieved   PT SHORT TERM GOAL #2   Title reaches 4" in standing with RW support with supervision (Target Date: 04/26/14)   Time 1   Period Months   Status Achieved  PT SHORT TERM GOAL #3   Title knee extension in standing with RW support -30 degrees bilaterally. (Target Date: 04/26/14)   Time 1   Period Months   Status Achieved           PT Long Term Goals - 03/27/14 1727    PT LONG TERM GOAL #1   Title verbalizes ongoing progressive HEP including fitness program at fitness club. (Target Date: 05/26/14)   Time 2   Period Months   Status New   PT LONG TERM GOAL #2   Title reports pain with gait /standing in hamstrings </= 2/10 (Target Date: 05/26/14)    Time 2   Period Months   Status New   PT LONG TERM GOAL #3   Title reaches 6" in standing with RW support safely. (Target Date: 05/26/14)   Time 2   Period Months   Status New   PT LONG TERM GOAL #4   Title ambulates 300' with RW safely with supervision. (Target Date: 05/26/14)   Time 2   Period Months   Status New   PT LONG TERM GOAL #5   Title Gait Velocity >0.5 ft/sec (Target Date: 05/26/14)   Time 2   Period Months        04/26/14 1408  Plan  Clinical Impression Statement Pt has met all STG's. Progressing toward LTG's.  Pt will benefit from skilled therapeutic intervention in order to improve on the following deficits Abnormal gait;Decreased activity tolerance;Decreased balance;Decreased mobility;Decreased knowledge of use of DME;Decreased range of motion;Decreased strength;Impaired tone  Rehab Potential Good  Clinical Impairments Affecting Rehab Potential time since injury  PT Frequency 2x / week  PT Duration Other (comment) (9 weeks (2 months))  PT Treatment/Interventions ADLs/Self Care Home Management;Gait training;DME Instruction;Stair training;Functional mobility training;Therapeutic activities;Therapeutic exercise;Balance training;Neuromuscular re-education;Patient/family education;Manual techniques  PT Next Visit Plan Continue stretching, core strenthening and begin standing weight bearing activites.   Consulted and Agree with Plan of Care Patient    Problem List Patient Active Problem List   Diagnosis Date Noted  . Spastic tetraplegia 12/06/2013  . Hematuria 11/05/2013  . Contact dermatitis 05/12/2013  . Rash of hands 05/12/2013  . Fatigue 11/16/2012  . Anemia 11/16/2012  . History of recurrent UTIs 05/19/2010  . Functional quadriplegia 05/10/2009  . HEMATURIA 07/29/2007  . Closed fracture of cervical vertebra 07/29/2007    Willow Ora 04/28/2014, 12:24 PM  Willow Ora, PTA, [facility] [address] [place], [place]  [address] [phone] 04/28/2014, 12:24 PM

## 2014-05-01 ENCOUNTER — Encounter: Payer: Self-pay | Admitting: Physical Therapy

## 2014-05-01 ENCOUNTER — Ambulatory Visit: Payer: Commercial Managed Care - HMO | Admitting: Physical Therapy

## 2014-05-01 DIAGNOSIS — G8250 Quadriplegia, unspecified: Secondary | ICD-10-CM

## 2014-05-01 DIAGNOSIS — R6889 Other general symptoms and signs: Secondary | ICD-10-CM | POA: Diagnosis not present

## 2014-05-01 DIAGNOSIS — IMO0001 Reserved for inherently not codable concepts without codable children: Secondary | ICD-10-CM

## 2014-05-01 DIAGNOSIS — R269 Unspecified abnormalities of gait and mobility: Secondary | ICD-10-CM | POA: Diagnosis not present

## 2014-05-01 DIAGNOSIS — M6289 Other specified disorders of muscle: Secondary | ICD-10-CM

## 2014-05-01 DIAGNOSIS — M6249 Contracture of muscle, multiple sites: Secondary | ICD-10-CM | POA: Diagnosis not present

## 2014-05-01 NOTE — Therapy (Signed)
[facility] [address] [place], [place], [address] Phone: [phone]   Fax:  [phone]  Physical Therapy Treatment  Patient Details  Name: Andrew Sullivan MRN: [id number] Date of Birth: 1970/04/16 Referring Provider:  Timmothy Euler, MD  Encounter Date: 05/01/2014      PT End of Session - 05/01/14 1400    Visit Number 10   Number of Visits 18   Date for PT Re-Evaluation 05/26/14   PT Start Time 1315   PT Stop Time 1400   PT Time Calculation (min) 45 min   Equipment Utilized During Treatment Gait belt   Activity Tolerance Patient tolerated treatment well   Behavior During Therapy [facility] for tasks assessed/performed      Past Medical History  Diagnosis Date  . Paraparesis of both lower limbs     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 69 in 20  . History of recurrent UTIs   . Erectile dysfunction   . Gross hematuria   . History of spinal cord injury     52 (age 36 fell from ladder) w/ cervical spine fx  s/p  c4 -- c6  fusion--  residual paraplegic bilateral legs (great below T8 uses w/c and can transfer self  . Spastic tetraplegia     Past Surgical History  Procedure Laterality Date  . Cervical fusion  1988    C4 -- C6 w/ spinal cord injury  . Hip arthroplasty  08/12/2011    Procedure: ARTHROPLASTY BIPOLAR HIP;  Surgeon: Mauri Pole, MD;  Location: [facility];  Service: Orthopedics;  Laterality: Left;  Hemi-arthroplasty  . Cystoscopy with retrograde pyelogram, ureteroscopy and stent placement Left 12/27/2013    Procedure: [place], URETEROSCOPY AND STENT PLACEMENT;  Surgeon: Reece Packer, MD;  Location: [facility];  Service: Urology;  Laterality: Left;  . Cystoscopy/retrograde/ureteroscopy Left 01/09/2014    Procedure: CYSTOSCOPY/RETROGRADE/URETEROSCOPY, URETERAL BIOPSY AND STENT EXCHANGE;  Surgeon: Alexis Frock, MD;  [facility];  Service:  Urology;  Laterality: Left;    There were no vitals taken for this visit.  Visit Diagnosis:  Abnormality of gait  Activity intolerance  Quadriparesis (muscle weakness)  Muscle hypertonicity      Subjective Assessment - 05/01/14 1331    Symptoms no falls or new issues. doing exercises as advised.   Currently in Pain? No/denies     Gait Training:  Patient ambulated 150' lobby to gym with rolling walker with cues on Trunk posture and knee extension  Standing in Parallel bars with belt positioned to support knees into extension: standing without UE support for up to 3 seconds, alternate overhead reach with single UE support, Red theraband rows 10 reps each UE & forward reaches 10 reps each UE, trunk rotation with single UE support, trunk forward lean with bil. UE support with cues to focus on back muscles over UEs to come to upright.  Standing in parallel bars without belt supporting knees: step forward (front heel clears back toes) mini squat with cues on pushing upward with max. Knee extension 5 reps with each LE in front, side stepping 5 reps 2x each direction, backwards gait 5',  Patient ambulated 200' with rolling walker with supervision. Gait Velocity 0.40 ft/sec.                       PT Education - 05/01/14 1400    Education provided Yes   Education Details carrying over exercises in  PT to his workout, muscles are specific to ROM, speed and how we use them - function   Person(s) Educated Patient   Methods Explanation   Comprehension Verbalized understanding          PT Short Term Goals - 04/28/14 1224    PT SHORT TERM GOAL #1   Title demonstrates updated HEP correctly (Target Date: 04/26/14)   Time 1   Period Months   Status Achieved   PT SHORT TERM GOAL #2   Title reaches 4" in standing with RW support with supervision (Target Date: 04/26/14)   Time 1   Period Months   Status Achieved   PT SHORT TERM GOAL #3   Title knee extension in standing  with RW support -30 degrees bilaterally. (Target Date: 04/26/14)   Time 1   Period Months   Status Achieved           PT Long Term Goals - 03/27/14 1727    PT LONG TERM GOAL #1   Title verbalizes ongoing progressive HEP including fitness program at fitness club. (Target Date: 05/26/14)   Time 2   Period Months   Status New   PT LONG TERM GOAL #2   Title reports pain with gait /standing in hamstrings </= 2/10 (Target Date: 05/26/14)   Time 2   Period Months   Status New   PT LONG TERM GOAL #3   Title reaches 6" in standing with RW support safely. (Target Date: 05/26/14)   Time 2   Period Months   Status New   PT LONG TERM GOAL #4   Title ambulates 300' with RW safely with supervision. (Target Date: 05/26/14)   Time 2   Period Months   Status New   PT LONG TERM GOAL #5   Title Gait Velocity >0.5 ft/sec (Target Date: 05/26/14)   Time 2   Period Months               Plan - May 11, 2014 1400    Clinical Impression Statement Patient is improving function with better ROM of muscle strength as noted by gait & standing with more knee extension. He required less UE support when performing standing exercises with knees blocked.         Pt will benefit from skilled therapeutic intervention in order to improve on the following deficits Abnormal gait;Decreased activity tolerance;Decreased balance;Decreased mobility;Decreased knowledge of use of DME;Decreased range of motion;Decreased strength;Impaired tone   Rehab Potential Good   Clinical Impairments Affecting Rehab Potential time since injury   PT Frequency 2x / week   PT Duration Other (comment)  9 weeks (2 months)   PT Treatment/Interventions ADLs/Self Care Home Management;Gait training;DME Instruction;Stair training;Functional mobility training;Therapeutic activities;Therapeutic exercise;Balance training;Neuromuscular re-education;Patient/family education;Manual techniques   PT Next Visit Plan Continue stretching, core strenthening  and begin standing weight bearing activites.    Consulted and Agree with Plan of Care Patient          G-Codes - 2014-05-11 06/18/51    Functional Assessment Tool Used Gait Velocity 0.40 ft/sec; ambulates 200' with RW with supervision   Functional Limitation Mobility: Walking and moving around   Mobility: Walking and Moving Around Current Status [id number]) At least 60 percent but less than 80 percent impaired, limited or restricted   Mobility: Walking and Moving Around Goal Status [phone]) At least 40 percent but less than 60 percent impaired, limited or restricted      Problem List Patient Active Problem List   Diagnosis Date Noted  .  Spastic tetraplegia 12/06/2013  . Hematuria 11/05/2013  . Contact dermatitis 05/12/2013  . Rash of hands 05/12/2013  . Fatigue 11/16/2012  . Anemia 11/16/2012  . History of recurrent UTIs 05/19/2010  . Functional quadriplegia 05/10/2009  . HEMATURIA 07/29/2007  . Closed fracture of cervical vertebra 07/29/2007    Isabellamarie Randa PT, DPT 05/01/2014, 8:55 PM  [facility] [address] [place], [place], [address] Phone: [phone]   Fax:  [phone]

## 2014-05-03 ENCOUNTER — Ambulatory Visit: Payer: Commercial Managed Care - HMO | Admitting: Physical Therapy

## 2014-05-03 ENCOUNTER — Encounter: Payer: Self-pay | Admitting: Physical Therapy

## 2014-05-03 DIAGNOSIS — R6889 Other general symptoms and signs: Secondary | ICD-10-CM

## 2014-05-03 DIAGNOSIS — R269 Unspecified abnormalities of gait and mobility: Secondary | ICD-10-CM | POA: Diagnosis not present

## 2014-05-03 DIAGNOSIS — M6249 Contracture of muscle, multiple sites: Secondary | ICD-10-CM | POA: Diagnosis not present

## 2014-05-03 DIAGNOSIS — G8250 Quadriplegia, unspecified: Secondary | ICD-10-CM | POA: Diagnosis not present

## 2014-05-03 NOTE — Therapy (Signed)
[facility] [address] [place], [place], [address] Phone: [phone]   Fax:  [phone]  Physical Therapy Treatment  Patient Details  Name: Andrew Sullivan MRN: [id number] Date of Birth: Jul 25, 1970 Referring Provider:  Timmothy Euler, MD  Encounter Date: 05/03/2014      PT End of Session - 05/03/14 1326    Visit Number 11   Number of Visits 18   Date for PT Re-Evaluation 05/26/14   PT Start Time 9323   PT Stop Time 1355   PT Time Calculation (min) 38 min   Equipment Utilized During Treatment Gait belt   Activity Tolerance Patient tolerated treatment well   Behavior During Therapy [facility] for tasks assessed/performed      Past Medical History  Diagnosis Date  . Paraparesis of both lower limbs     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 19 in 87  . History of recurrent UTIs   . Erectile dysfunction   . Gross hematuria   . History of spinal cord injury     55 (age 44 fell from ladder) w/ cervical spine fx  s/p  c4 -- c6  fusion--  residual paraplegic bilateral legs (great below T8 uses w/c and can transfer self  . Spastic tetraplegia     Past Surgical History  Procedure Laterality Date  . Cervical fusion  1988    C4 -- C6 w/ spinal cord injury  . Hip arthroplasty  08/12/2011    Procedure: ARTHROPLASTY BIPOLAR HIP;  Surgeon: Mauri Pole, MD;  [facility]: [facility];  Service: Orthopedics;  Laterality: Left;  Hemi-arthroplasty  . Cystoscopy with retrograde pyelogram, ureteroscopy and stent placement Left 12/27/2013    Procedure: [place], URETEROSCOPY AND STENT PLACEMENT;  Surgeon: Reece Packer, MD;  Location: [facility];  Service: Urology;  Laterality: Left;  . Cystoscopy/retrograde/ureteroscopy Left 01/09/2014    Procedure: CYSTOSCOPY/RETROGRADE/URETEROSCOPY, URETERAL BIOPSY AND STENT EXCHANGE;  Surgeon: Alexis Frock, MD;  [facility];  Service:  Urology;  Laterality: Left;    There were no vitals taken for this visit.  Visit Diagnosis:  Abnormality of gait  Activity intolerance      Subjective Assessment - 05/03/14 1325    Symptoms No new complaints. No pain or falls to report.   Currently in Pain? No/denies   Pain Score 0-No pain     Treatment: 110 feet x 1 with walker with supervision and cues on posture and knee extension with gait. Gait speed 0.48 feet/sec with walker.  Seated on mat passive stretching to bil hamstrings and hip adductors with legs propped on therapist leg and rolling stool used to provide stretch. Long duration hold for each stretch.  Standing in parallel bars with belt positioned to block knees into extension: - without UE support 15 sec's x 3 reps with min guard assist - alternating UE raises x 10 each side - alternating cross body reaching x 10 each side - with red theraband UE rows and shoulder extension x10 each bil arm's with single UE support  120 feet with walker from gym to lobby with min guard assist and cues as above. Gait speed this time 0.56 ft/sec with walker.        PT Short Term Goals - 04/28/14 1224    PT SHORT TERM GOAL #1   Title demonstrates updated HEP correctly (Target Date: 04/26/14)   Time 1   Period Months   Status Achieved   PT SHORT  TERM GOAL #2   Title reaches 4" in standing with RW support with supervision (Target Date: 04/26/14)   Time 1   Period Months   Status Achieved   PT SHORT TERM GOAL #3   Title knee extension in standing with RW support -30 degrees bilaterally. (Target Date: 04/26/14)   Time 1   Period Months   Status Achieved           PT Long Term Goals - 03/27/14 1727    PT LONG TERM GOAL #1   Title verbalizes ongoing progressive HEP including fitness program at fitness club. (Target Date: 05/26/14)   Time 2   Period Months   Status New   PT LONG TERM GOAL #2   Title reports pain with gait /standing in hamstrings </= 2/10 (Target Date:  05/26/14)   Time 2   Period Months   Status New   PT LONG TERM GOAL #3   Title reaches 6" in standing with RW support safely. (Target Date: 05/26/14)   Time 2   Period Months   Status New   PT LONG TERM GOAL #4   Title ambulates 300' with RW safely with supervision. (Target Date: 05/26/14)   Time 2   Period Months   Status New   PT LONG TERM GOAL #5   Title Gait Velocity >0.5 ft/sec (Target Date: 05/26/14)   Time 2   Period Months           Plan - 05/03/14 1326    Clinical Impression Statement Pt continues to make steady progress toward goals.   Pt will benefit from skilled therapeutic intervention in order to improve on the following deficits Abnormal gait;Decreased activity tolerance;Decreased balance;Decreased mobility;Decreased knowledge of use of DME;Decreased range of motion;Decreased strength;Impaired tone   Rehab Potential Good   Clinical Impairments Affecting Rehab Potential time since injury   PT Frequency 2x / week   PT Duration Other (comment)  9 weeks (2 months)   PT Treatment/Interventions ADLs/Self Care Home Management;Gait training;DME Instruction;Stair training;Functional mobility training;Therapeutic activities;Therapeutic exercise;Balance training;Neuromuscular re-education;Patient/family education;Manual techniques   PT Next Visit Plan Continue stretching, core strenthening and begin standing weight bearing activites.    Consulted and Agree with Plan of Care Patient        Problem List Patient Active Problem List   Diagnosis Date Noted  . Spastic tetraplegia 12/06/2013  . Hematuria 11/05/2013  . Contact dermatitis 05/12/2013  . Rash of hands 05/12/2013  . Fatigue 11/16/2012  . Anemia 11/16/2012  . History of recurrent UTIs 05/19/2010  . Functional quadriplegia 05/10/2009  . HEMATURIA 07/29/2007  . Closed fracture of cervical vertebra 07/29/2007    Willow Ora 05/04/2014, 3:19 PM  Willow Ora, PTA, [facility] [address] [place], [place] [address] [phone] 05/04/2014, 3:19 PM

## 2014-05-08 ENCOUNTER — Encounter: Payer: Self-pay | Admitting: Physical Therapy

## 2014-05-08 ENCOUNTER — Ambulatory Visit: Payer: Commercial Managed Care - HMO | Admitting: Physical Therapy

## 2014-05-08 DIAGNOSIS — G8250 Quadriplegia, unspecified: Secondary | ICD-10-CM | POA: Diagnosis not present

## 2014-05-08 DIAGNOSIS — R6889 Other general symptoms and signs: Secondary | ICD-10-CM | POA: Diagnosis not present

## 2014-05-08 DIAGNOSIS — M6249 Contracture of muscle, multiple sites: Secondary | ICD-10-CM | POA: Diagnosis not present

## 2014-05-08 DIAGNOSIS — R269 Unspecified abnormalities of gait and mobility: Secondary | ICD-10-CM | POA: Diagnosis not present

## 2014-05-08 DIAGNOSIS — M6289 Other specified disorders of muscle: Secondary | ICD-10-CM

## 2014-05-08 DIAGNOSIS — IMO0001 Reserved for inherently not codable concepts without codable children: Secondary | ICD-10-CM

## 2014-05-08 NOTE — Therapy (Signed)
[facility] [address] [place], [place], [address] Phone: [phone]   Fax:  [phone]  Physical Therapy Treatment  Patient Details  Name: Andrew Sullivan MRN: [id number] Date of Birth: 1970/08/06 Referring Provider:  Timmothy Euler, MD  Encounter Date: 05/08/2014      PT End of Session - 05/08/14 1315    Visit Number 12   Number of Visits 18   Date for PT Re-Evaluation 05/26/14   PT Start Time 1315   PT Stop Time 1400   PT Time Calculation (min) 45 min   Equipment Utilized During Treatment Gait belt   Activity Tolerance Patient tolerated treatment well   Behavior During Therapy [facility] for tasks assessed/performed      Past Medical History  Diagnosis Date  . Paraparesis of both lower limbs     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 40 in 74  . History of recurrent UTIs   . Erectile dysfunction   . Gross hematuria   . History of spinal cord injury     44 (age 70 fell from ladder) w/ cervical spine fx  s/p  c4 -- c6  fusion--  residual paraplegic bilateral legs (great below T8 uses w/c and can transfer self  . Spastic tetraplegia     Past Surgical History  Procedure Laterality Date  . Cervical fusion  1988    C4 -- C6 w/ spinal cord injury  . Hip arthroplasty  08/12/2011    Procedure: ARTHROPLASTY BIPOLAR HIP;  Surgeon: Mauri Pole, MD;  [facility];  Service: Orthopedics;  Laterality: Left;  Hemi-arthroplasty  . Cystoscopy with retrograde pyelogram, ureteroscopy and stent placement Left 12/27/2013    Procedure: [place], URETEROSCOPY AND STENT PLACEMENT;  Surgeon: Reece Packer, MD;  Location: [facility];  Service: Urology;  Laterality: Left;  . Cystoscopy/retrograde/ureteroscopy Left 01/09/2014    Procedure: CYSTOSCOPY/RETROGRADE/URETEROSCOPY, URETERAL BIOPSY AND STENT EXCHANGE;  Surgeon: Alexis Frock, MD;  [facility];  Service:  Urology;  Laterality: Left;    There were no vitals taken for this visit.  Visit Diagnosis:  Abnormality of gait  Activity intolerance  Quadriparesis (muscle weakness)  Muscle hypertonicity      Subjective Assessment - 05/08/14 1329    Symptoms No falls. He walks more straight up until he faitigue which is taking longer for onset. Now can walk from our building to car without resting which had to do intiially.   Currently in Pain? No/denies     Therapeutic Exercise: Standing with knees blocked against chair against wall with RW behind him: With one hand on wall - sliding one hand up wall alternating UE 5 reps, trunk rotation 5 reps each way, shoulder abduction alternate UE 5 reps each;  With 2 hands on wall, forward lean with recovery (back extensors) with cues to use back > UE; With both hands on RW, backward lean with recovery (abdominals) with cues to use abdominals. Standing with UE support for 10 seconds 3 reps. Side stepping at counter 5' right & left with SBA,  Backwards gait with RW 5' with SBA  Gait Training: PT demo changing sequence to RW, RLE, RW, LLE .Marland KitchenMarland Kitchen  PT demo step length working on clearing heel at initial contact past toe of contralateral stance limb, knee extension in stance; Patient ambulated 250' without rest with cues on above. Patient negotiated ramp & curb with RW with SBA / cues on technique.  PT Education - 05/08/14 1315    Education provided Yes   Education Details standing exercises with knees blocked by chair against wall & RW behind him,    Person(s) Educated Patient   Methods Explanation;Demonstration   Comprehension Verbalized understanding;Returned demonstration;Need further instruction          PT Short Term Goals - 04/28/14 1224    PT SHORT TERM GOAL #1   Title demonstrates updated HEP correctly (Target Date: 04/26/14)   Time 1   Period Months   Status Achieved   PT SHORT TERM GOAL #2   Title  reaches 4" in standing with RW support with supervision (Target Date: 04/26/14)   Time 1   Period Months   Status Achieved   PT SHORT TERM GOAL #3   Title knee extension in standing with RW support -30 degrees bilaterally. (Target Date: 04/26/14)   Time 1   Period Months   Status Achieved           PT Long Term Goals - 03/27/14 1727    PT LONG TERM GOAL #1   Title verbalizes ongoing progressive HEP including fitness program at fitness club. (Target Date: 05/26/14)   Time 2   Period Months   Status New   PT LONG TERM GOAL #2   Title reports pain with gait /standing in hamstrings </= 2/10 (Target Date: 05/26/14)   Time 2   Period Months   Status New   PT LONG TERM GOAL #3   Title reaches 6" in standing with RW support safely. (Target Date: 05/26/14)   Time 2   Period Months   Status New   PT LONG TERM GOAL #4   Title ambulates 300' with RW safely with supervision. (Target Date: 05/26/14)   Time 2   Period Months   Status New   PT LONG TERM GOAL #5   Title Gait Velocity >0.5 ft/sec (Target Date: 05/26/14)   Time 2   Period Months               Plan - 05/08/14 1315    Clinical Impression Statement Patient was able to safely perform standing exercises with knees blocked by chair against wall with RW behind him for safety. Patient has improved muscle endurance with ability to stand longer & walk further without rests.             Pt will benefit from skilled therapeutic intervention in order to improve on the following deficits Abnormal gait;Decreased activity tolerance;Decreased balance;Decreased mobility;Decreased knowledge of use of DME;Decreased range of motion;Decreased strength;Impaired tone   Rehab Potential Good   Clinical Impairments Affecting Rehab Potential time since injury   PT Frequency 2x / week   PT Duration Other (comment)  9 weeks (2 months)   PT Treatment/Interventions ADLs/Self Care Home Management;Gait training;DME Instruction;Stair  training;Functional mobility training;Therapeutic activities;Therapeutic exercise;Balance training;Neuromuscular re-education;Patient/family education;Manual techniques   PT Next Visit Plan Work towards LTGs, Continue stretching, core strenthening and begin standing weight bearing activites.    Consulted and Agree with Plan of Care Patient        Problem List Patient Active Problem List   Diagnosis Date Noted  . Spastic tetraplegia 12/06/2013  . Hematuria 11/05/2013  . Contact dermatitis 05/12/2013  . Rash of hands 05/12/2013  . Fatigue 11/16/2012  . Anemia 11/16/2012  . History of recurrent UTIs 05/19/2010  . Functional quadriplegia 05/10/2009  . HEMATURIA 07/29/2007  . Closed fracture of cervical vertebra 07/29/2007    Joziah Dollins PT,  DPT 05/08/2014, 8:00 PM  [facility] [address] [place], [place], [address] Phone: [phone]   Fax:  [phone]

## 2014-05-10 ENCOUNTER — Ambulatory Visit: Payer: Commercial Managed Care - HMO | Attending: Physical Medicine & Rehabilitation | Admitting: Physical Therapy

## 2014-05-10 ENCOUNTER — Encounter: Payer: Self-pay | Admitting: Physical Therapy

## 2014-05-10 DIAGNOSIS — R6889 Other general symptoms and signs: Secondary | ICD-10-CM

## 2014-05-10 DIAGNOSIS — R269 Unspecified abnormalities of gait and mobility: Secondary | ICD-10-CM | POA: Insufficient documentation

## 2014-05-10 DIAGNOSIS — M6249 Contracture of muscle, multiple sites: Secondary | ICD-10-CM | POA: Diagnosis not present

## 2014-05-10 DIAGNOSIS — G8250 Quadriplegia, unspecified: Secondary | ICD-10-CM | POA: Diagnosis not present

## 2014-05-10 NOTE — Therapy (Signed)
[facility] [address] [place], [place], [address] Phone: [phone]   Fax:  [phone]  Physical Therapy Treatment  Patient Details  Name: BOW BUNTYN MRN: [id number] Date of Birth: 1970/05/19 Referring Provider:  Timmothy Euler, MD  Encounter Date: 05/10/2014      PT End of Session - 05/10/14 1241    Visit Number 13   Number of Visits 18   Date for PT Re-Evaluation 05/26/14   PT Start Time 1233   PT Stop Time 1315   PT Time Calculation (min) 42 min   Equipment Utilized During Treatment Gait belt   Activity Tolerance Patient tolerated treatment well   Behavior During Therapy [facility] for tasks assessed/performed      Past Medical History  Diagnosis Date  . Paraparesis of both lower limbs     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 33 in 35  . History of recurrent UTIs   . Erectile dysfunction   . Gross hematuria   . History of spinal cord injury     9 (age 43 fell from ladder) w/ cervical spine fx  s/p  c4 -- c6  fusion--  residual paraplegic bilateral legs (great below T8 uses w/c and can transfer self  . Spastic tetraplegia     Past Surgical History  Procedure Laterality Date  . Cervical fusion  1988    C4 -- C6 w/ spinal cord injury  . Hip arthroplasty  08/12/2011    Procedure: ARTHROPLASTY BIPOLAR HIP;  Surgeon: Mauri Pole, MD;  [facility];  Service: Orthopedics;  Laterality: Left;  Hemi-arthroplasty  . Cystoscopy with retrograde pyelogram, ureteroscopy and stent placement Left 12/27/2013    Procedure: [place], URETEROSCOPY AND STENT PLACEMENT;  Surgeon: Reece Packer, MD;  Location: [facility];  Service: Urology;  Laterality: Left;  . Cystoscopy/retrograde/ureteroscopy Left 01/09/2014    Procedure: CYSTOSCOPY/RETROGRADE/URETEROSCOPY, URETERAL BIOPSY AND STENT EXCHANGE;  Surgeon: Alexis Frock, MD;  [facility];  Service:  Urology;  Laterality: Left;    There were no vitals taken for this visit.  Visit Diagnosis:  Abnormality of gait  Activity intolerance      Subjective Assessment - 05/10/14 1241    Symptoms No new complaints. No falls.    Currently in Pain? Yes   Pain Score 1    Pain Location --  hamstrings   Pain Orientation Right;Left   Pain Descriptors / Indicators Tightness   Pain Type Chronic pain   Aggravating Factors  walking, long sitting   Pain Relieving Factors stretching, rest     Treatment  Gait: - 110 feet x 2 with RW with min guard assist. Cues on posture and knee extension with stance into push off.  Exercise - passive stretching to bil hamstrings, calf's/heel cords and hip adductors with pt's leg on therapist lap on rolling stool. Prolonged holds with gradual increase in stretch provided. - side lying on mat: passive hip flexor stretching with 2 person assist for correct positioning with the stretch. Prolonged hold with gradual increase in stretch.  Neuro Re-ed: Standing in parallel bars with knees blocked by mobility belt into knee extension - unsupported standing 15-20 sec's x 5 reps - alternating reaching up to ceiling x 12 each arm with emphasis on tall posture and knee extension - alternating cross body reaching x 12 each direction with emphasis on knee extension and trunk rotation        PT Short Term Goals -  04/28/14 1224    PT SHORT TERM GOAL #1   Title demonstrates updated HEP correctly (Target Date: 04/26/14)   Time 1   Period Months   Status Achieved   PT SHORT TERM GOAL #2   Title reaches 4" in standing with RW support with supervision (Target Date: 04/26/14)   Time 1   Period Months   Status Achieved   PT SHORT TERM GOAL #3   Title knee extension in standing with RW support -30 degrees bilaterally. (Target Date: 04/26/14)   Time 1   Period Months   Status Achieved           PT Long Term Goals - 03/27/14 1727    PT LONG TERM GOAL #1   Title  verbalizes ongoing progressive HEP including fitness program at fitness club. (Target Date: 05/26/14)   Time 2   Period Months   Status New   PT LONG TERM GOAL #2   Title reports pain with gait /standing in hamstrings </= 2/10 (Target Date: 05/26/14)   Time 2   Period Months   Status New   PT LONG TERM GOAL #3   Title reaches 6" in standing with RW support safely. (Target Date: 05/26/14)   Time 2   Period Months   Status New   PT LONG TERM GOAL #4   Title ambulates 300' with RW safely with supervision. (Target Date: 05/26/14)   Time 2   Period Months   Status New   PT LONG TERM GOAL #5   Title Gait Velocity >0.5 ft/sec (Target Date: 05/26/14)   Time 2   Period Months          Plan - 05/10/14 1242    Clinical Impression Statement Pt making steady progress toward his LTG's.   Pt will benefit from skilled therapeutic intervention in order to improve on the following deficits Abnormal gait;Decreased activity tolerance;Decreased balance;Decreased mobility;Decreased knowledge of use of DME;Decreased range of motion;Decreased strength;Impaired tone   Rehab Potential Good   Clinical Impairments Affecting Rehab Potential time since injury   PT Frequency 2x / week   PT Duration Other (comment)  9 weeks (2 months)   PT Treatment/Interventions ADLs/Self Care Home Management;Gait training;DME Instruction;Stair training;Functional mobility training;Therapeutic activities;Therapeutic exercise;Balance training;Neuromuscular re-education;Patient/family education;Manual techniques   PT Next Visit Plan Work towards LTGs, Continue stretching, core strenthening and standing weight bearing activites.    Consulted and Agree with Plan of Care Patient        Problem List Patient Active Problem List   Diagnosis Date Noted  . Spastic tetraplegia 12/06/2013  . Hematuria 11/05/2013  . Contact dermatitis 05/12/2013  . Rash of hands 05/12/2013  . Fatigue 11/16/2012  . Anemia 11/16/2012  . History of  recurrent UTIs 05/19/2010  . Functional quadriplegia 05/10/2009  . HEMATURIA 07/29/2007  . Closed fracture of cervical vertebra 07/29/2007    Willow Ora 05/11/2014, 1:37 PM  Willow Ora, PTA, [facility] [address], [place], [place] [address] [phone] 05/11/2014, 1:37 PM

## 2014-05-15 ENCOUNTER — Ambulatory Visit: Payer: Commercial Managed Care - HMO

## 2014-05-15 ENCOUNTER — Encounter
Payer: Commercial Managed Care - HMO | Attending: Physical Medicine & Rehabilitation | Admitting: Physical Medicine & Rehabilitation

## 2014-05-15 ENCOUNTER — Encounter: Payer: Self-pay | Admitting: Physical Medicine & Rehabilitation

## 2014-05-15 VITALS — BP 117/56 | HR 90 | Resp 14

## 2014-05-15 DIAGNOSIS — R6889 Other general symptoms and signs: Secondary | ICD-10-CM | POA: Diagnosis not present

## 2014-05-15 DIAGNOSIS — R269 Unspecified abnormalities of gait and mobility: Secondary | ICD-10-CM | POA: Diagnosis not present

## 2014-05-15 DIAGNOSIS — S129XXS Fracture of neck, unspecified, sequela: Secondary | ICD-10-CM | POA: Insufficient documentation

## 2014-05-15 DIAGNOSIS — M6289 Other specified disorders of muscle: Secondary | ICD-10-CM

## 2014-05-15 DIAGNOSIS — IMO0001 Reserved for inherently not codable concepts without codable children: Secondary | ICD-10-CM

## 2014-05-15 DIAGNOSIS — G8250 Quadriplegia, unspecified: Secondary | ICD-10-CM | POA: Insufficient documentation

## 2014-05-15 DIAGNOSIS — M6249 Contracture of muscle, multiple sites: Secondary | ICD-10-CM | POA: Diagnosis not present

## 2014-05-15 NOTE — Patient Instructions (Signed)
CONTINUE WORKING DAILY ON YOUR STRETCHING.   YOU ARE DOING A GREAT JOB!!!

## 2014-05-15 NOTE — Progress Notes (Signed)
Subjective:    Patient ID: Andrew Sullivan, male    DOB: 1970-11-12, 44 y.o.   MRN: [id number]  HPI  Andrew Sullivan is back in follow up of his spastic tetraplegia. He had good results. He is going through therapies and now and is walking with a RW!  He has given up the w/c. He still has some tightness in his legs but has found it's much improved. He continues on the baclofen for baseline control.   Pain Inventory Average Pain 1 Pain Right Now 2 My pain is constant, dull and aching  In the last 24 hours, has pain interfered with the following? General activity 1 Relation with others 2 Enjoyment of life 1 What TIME of day is your pain at its worst? morning Sleep (in general) Good  Pain is worse with: standing Pain improves with: rest Relief from Meds: 1  Mobility use a walker how many minutes can you walk? 2-3 ability to climb steps?  no do you drive?  yes  Function disabled: date disabled .  Neuro/Psych weakness trouble walking  Prior Studies Any changes since last visit?  no  Physicians involved in your care Any changes since last visit?  no   History reviewed. No pertinent family history. History   Social History  . Marital Status: Single    Spouse Name: N/A  . Number of Children: N/A  . Years of Education: N/A   Social History Main Topics  . Smoking status: Former Smoker -- 1.00 packs/day for .5 years    Types: Cigarettes    Quit date: 12/27/1986  . Smokeless tobacco: Never Used  . Alcohol Use: No  . Drug Use: Yes    Special: "Crack" cocaine  . Sexual Activity: Not on file   Other Topics Concern  . None   Social History Narrative   1 brother 1 sister.  Pt. Lives in [place] with his mother.  denies any EtOH use tobacco or illicit drug use. Remains active.  Is on disability secondary to neck fracture.   Past Surgical History  Procedure Laterality Date  . Cervical fusion  1988    C4 -- C6 w/ spinal cord injury  . Hip arthroplasty  08/12/2011   Procedure: ARTHROPLASTY BIPOLAR HIP;  Surgeon: Mauri Pole, MD;  [facility];  Service: Orthopedics;  Laterality: Left;  Hemi-arthroplasty  . Cystoscopy with retrograde pyelogram, ureteroscopy and stent placement Left 12/27/2013    Procedure: [place], URETEROSCOPY AND STENT PLACEMENT;  Surgeon: Reece Packer, MD;  Location: [facility];  Service: Urology;  Laterality: Left;  . Cystoscopy/retrograde/ureteroscopy Left 01/09/2014    Procedure: CYSTOSCOPY/RETROGRADE/URETEROSCOPY, URETERAL BIOPSY AND STENT EXCHANGE;  Surgeon: Alexis Frock, MD;  [facility];  Service: Urology;  Laterality: Left;   Past Medical History  Diagnosis Date  . Paraparesis of both lower limbs     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 58 in 72  . History of recurrent UTIs   . Erectile dysfunction   . Gross hematuria   . History of spinal cord injury     9 (age 79 fell from ladder) w/ cervical spine fx  s/p  c4 -- c6  fusion--  residual paraplegic bilateral legs (great below T8 uses w/c and can transfer self  . Spastic tetraplegia    BP 117/56 mmHg  Pulse 90  Resp 14  SpO2 98%  Opioid Risk Score:   Fall Risk Score: Low Fall Risk (0-5 points)  Review of  Systems  HENT: Negative.   Eyes: Negative.   Respiratory: Negative.   Cardiovascular: Negative.   Gastrointestinal: Negative.   Endocrine: Negative.   Genitourinary: Negative.   Musculoskeletal:       LEG PAIN  Allergic/Immunologic: Negative.   Neurological: Positive for weakness.       TROUBLE WALKING  Hematological: Negative.   Psychiatric/Behavioral: Negative.        Objective:   Physical Exam  Gen: pleasant, alert/oriented.  HEENT: fair dentition, oral mucosa moist  Hrt: reg rate  Chest: clear  Abd: non tender non distended  Musc: hamstrings tight bilaterally---could not passively range past -30 neutral extension  Neuro: alert and oriented x3. Good insight and  awareness. Speech clear. MMT: Bilateral upper: deltoid, bicep, wrist all 5/5. Tricep 4+/5. HI 3- to 3/5 with some atrophy. RLE-2HF, 2 HAD, 1-2- KE,KF, 1-2 ADF/APF. LLE-2HF and HAD and 1-2/5 KE, KF, ADF/APF. Decreased hot/cold sensation and pain sense below the waist---difficult to assess along the trunk but slightly altered perhaps. DTR's 1+ UE's, LE's 3+ patella, Achilles. Clonus 4 beats right ankle, sustained left ankle. Tone 2/4 bilateral hamstrings, 2/4 achilles, trace HAD's. Skin: intact  Psych: mood appropriate   Assessment:  1. C7 SCI incomplete with spastic tetraplegia more involving the LE's 2. Pain syndrome due to the above with flexion contractures at both knees 3. Neurogenic bladder/recurrent UTI's   Plan:  1. Continue baclofen at current dosing, 20mg  qid 2. Continue with outpt therapies and HEP---He has made great progress! 3. Botox 500 units bilateral hamstrings and potentially his gastroc/soleus muscles- need to have botox sent over from his pharmacy per his insurance 4. Rx UTI/stents per urology 5. gabapentin 300mg  TID 6. Follow up with me in about4-5 weeks. 15 minutes of direct patient care/interaction were spent today during this office encounter 7. Baclofen pump?

## 2014-05-15 NOTE — Therapy (Signed)
[facility] [address] [place], [place], [address] Phone: [phone]   Fax:  [phone]  Physical Therapy Treatment  Patient Details  Name: Andrew Sullivan MRN: [id number] Date of Birth: 1970/05/19 Referring Provider:  Timmothy Euler, MD  Encounter Date: 05/15/2014      PT End of Session - 05/15/14 1646    Visit Number 14   Number of Visits 18   Date for PT Re-Evaluation 05/26/14   Authorization Type [employer]; G codes required   PT Start Time 1448   PT Stop Time 1530   PT Time Calculation (min) 42 min      Past Medical History  Diagnosis Date  . Paraparesis of both lower limbs     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 11 in 37  . History of recurrent UTIs   . Erectile dysfunction   . Gross hematuria   . History of spinal cord injury     67 (age 2 fell from ladder) w/ cervical spine fx  s/p  c4 -- c6  fusion--  residual paraplegic bilateral legs (great below T8 uses w/c and can transfer self  . Spastic tetraplegia     Past Surgical History  Procedure Laterality Date  . Cervical fusion  1988    C4 -- C6 w/ spinal cord injury  . Hip arthroplasty  08/12/2011    Procedure: ARTHROPLASTY BIPOLAR HIP;  Surgeon: Mauri Pole, MD;  [facility];  Service: Orthopedics;  Laterality: Left;  Hemi-arthroplasty  . Cystoscopy with retrograde pyelogram, ureteroscopy and stent placement Left 12/27/2013    Procedure: [place], URETEROSCOPY AND STENT PLACEMENT;  Surgeon: Reece Packer, MD;  Location: [facility];  Service: Urology;  Laterality: Left;  . Cystoscopy/retrograde/ureteroscopy Left 01/09/2014    Procedure: CYSTOSCOPY/RETROGRADE/URETEROSCOPY, URETERAL BIOPSY AND STENT EXCHANGE;  Surgeon: Alexis Frock, MD;  [facility];  Service: Urology;  Laterality: Left;    There were no vitals taken for this visit.  Visit Diagnosis:  Abnormality  of gait  Activity intolerance  Quadriparesis (muscle weakness)  Muscle hypertonicity      Subjective Assessment - 05/15/14 1512    Symptoms No new complaints. No falls. Just tired as he had an MD apointment earlier today.   Currently in Pain? Yes   Pain Score 4    Pain Location Knee   Pain Orientation Left;Posterior   Pain Descriptors / Indicators Aching   Aggravating Factors  standing, especially in standing frame (pt reported this after spending time in standing frame)      Gait training with RW and close supervision x 58' rest, x34' rest, x 85'. Distances limited by fatigue. Pt initially demonstrated significant scissoring, decreased step length, increased effort required for each step and final trial at end of session, pt demonstrated notable improvement in gait pattern and efficiency.  Therex -10 minutes in standing frame for prolonged hamstring stretch, increased weight bearing for temporarily normalization of tone; and pt simultaneously performed various core strengthening exercises while up in standing frame including: isometric bilateral shoulder flexion against ball, isometric trunk flexion against ball, isometric lateral thoracic flexion against therapist resistance each side, BUE ball toss initially with small ball, then with red physioball for increased intensity. - passive stretching of pt's hip extensors, internal and external rotators, and adductors  Neuro re-ed Reaching task in standing with intermittent CGA and intermittent UE support x3 minutes.  PT Short Term Goals - 04/28/14 1224    PT SHORT TERM GOAL #1   Title demonstrates updated HEP correctly (Target Date: 04/26/14)   Time 1   Period Months   Status Achieved   PT SHORT TERM GOAL #2   Title reaches 4" in standing with RW support with supervision (Target Date: 04/26/14)   Time 1   Period Months   Status Achieved   PT SHORT TERM GOAL #3   Title knee extension in  standing with RW support -30 degrees bilaterally. (Target Date: 04/26/14)   Time 1   Period Months   Status Achieved           PT Long Term Goals - 03/27/14 1727    PT LONG TERM GOAL #1   Title verbalizes ongoing progressive HEP including fitness program at fitness club. (Target Date: 05/26/14)   Time 2   Period Months   Status New   PT LONG TERM GOAL #2   Title reports pain with gait /standing in hamstrings </= 2/10 (Target Date: 05/26/14)   Time 2   Period Months   Status New   PT LONG TERM GOAL #3   Title reaches 6" in standing with RW support safely. (Target Date: 05/26/14)   Time 2   Period Months   Status New   PT LONG TERM GOAL #4   Title ambulates 300' with RW safely with supervision. (Target Date: 05/26/14)   Time 2   Period Months   Status New   PT LONG TERM GOAL #5   Title Gait Velocity >0.5 ft/sec (Target Date: 05/26/14)   Time 2   Period Months               Plan - 05/15/14 1647    Clinical Impression Statement Pt continues to make progress. He demonstrated notable decrease in lower extremity spasticity following standing frame activity and passive stretching, making ambulation more fluid with increased step length and decreased adductor tone.    PT Next Visit Plan Add visits? Consider standing frame as performed today to decrease tone, balance training        Problem List Patient Active Problem List   Diagnosis Date Noted  . Spastic tetraplegia 12/06/2013  . Hematuria 11/05/2013  . Contact dermatitis 05/12/2013  . Rash of hands 05/12/2013  . Fatigue 11/16/2012  . Anemia 11/16/2012  . History of recurrent UTIs 05/19/2010  . Functional quadriplegia 05/10/2009  . HEMATURIA 07/29/2007  . Closed fracture of cervical vertebra 07/29/2007    Delrae Sawyers, PT,DPT,NCS 05/15/2014 4:59 PM Phone [phone] FAX [phone]          [facility] [address] [place], [place], [address] Phone: [phone]   Fax:  [phone]

## 2014-05-17 ENCOUNTER — Encounter: Payer: Self-pay | Admitting: Physical Therapy

## 2014-05-17 ENCOUNTER — Ambulatory Visit: Payer: Commercial Managed Care - HMO | Admitting: Physical Therapy

## 2014-05-17 DIAGNOSIS — R6889 Other general symptoms and signs: Secondary | ICD-10-CM | POA: Diagnosis not present

## 2014-05-17 DIAGNOSIS — G8250 Quadriplegia, unspecified: Secondary | ICD-10-CM | POA: Diagnosis not present

## 2014-05-17 DIAGNOSIS — M6249 Contracture of muscle, multiple sites: Secondary | ICD-10-CM | POA: Diagnosis not present

## 2014-05-17 DIAGNOSIS — R269 Unspecified abnormalities of gait and mobility: Secondary | ICD-10-CM

## 2014-05-17 NOTE — Therapy (Signed)
[facility] [address] [place], [place], [address] Phone: [phone]   Fax:  [phone]  Physical Therapy Treatment  Patient Details  Name: Andrew Sullivan MRN: [id number] Date of Birth: 03-Aug-1970 Referring Provider:  Timmothy Euler, MD  Encounter Date: 05/17/2014      PT End of Session - 05/17/14 1409    Visit Number 15   Number of Visits 18   Date for PT Re-Evaluation 05/26/14   Authorization Type [employer]; G codes required   PT Start Time 1402   PT Stop Time 1444   PT Time Calculation (min) 42 min      Past Medical History  Diagnosis Date  . Paraparesis of both lower limbs     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 44 in 47  . History of recurrent UTIs   . Erectile dysfunction   . Gross hematuria   . History of spinal cord injury     2 (age 4 fell from ladder) w/ cervical spine fx  s/p  c4 -- c6  fusion--  residual paraplegic bilateral legs (great below T8 uses w/c and can transfer self  . Spastic tetraplegia     Past Surgical History  Procedure Laterality Date  . Cervical fusion  1988    C4 -- C6 w/ spinal cord injury  . Hip arthroplasty  08/12/2011    Procedure: ARTHROPLASTY BIPOLAR HIP;  Surgeon: Mauri Pole, MD;  [facility];  Service: Orthopedics;  Laterality: Left;  Hemi-arthroplasty  . Cystoscopy with retrograde pyelogram, ureteroscopy and stent placement Left 12/27/2013    Procedure: [place], URETEROSCOPY AND STENT PLACEMENT;  Surgeon: Reece Packer, MD;  Location: [facility];  Service: Urology;  Laterality: Left;  . Cystoscopy/retrograde/ureteroscopy Left 01/09/2014    Procedure: CYSTOSCOPY/RETROGRADE/URETEROSCOPY, URETERAL BIOPSY AND STENT EXCHANGE;  Surgeon: Alexis Frock, MD;  [facility];  Service: Urology;  Laterality: Left;    There were no vitals taken for this visit.  Visit Diagnosis:  Activity  intolerance  Abnormality of gait      Subjective Assessment - 05/17/14 1408    Symptoms No new complaints. No falls or pain to report.  Felt the standing frame helped him a lot, really stretched him out.   Currently in Pain? No/denies   Pain Score 0-No pain     Treatment:  Gait: - 100 feet with walker lobby to gym with supervision. Cues on bil knee extension with stand and push off. Cues on posture as well. 130 feet gym to lobby with walker after session. Pt noted to have improved knee extension, needing less cues after stretching and ex's performed in session.  Exercise: - passive stretching of pt's bil hamstrings, hip adductors and calf muscles. Prolonged holds with gradual increases in stretch provided.  In standing frame for prolonged hamstring stretching with weight bearing for decreased tone/spasms as able: - alternating UE raises x 10 each side with emphasis on upright/midline posture - with red pball- bil hands isometric pushing forward against therapist resistance for abdominal contractions 5 sec's x 10 reps; bil UE shoulder extension against ball for isometric strengthening; - trunk extension x 10 reps - ball toss with blue textured ball x 1-2 minutes with emphasis on upright/tall posture        PT Short Term Goals - 04/28/14 1224    PT SHORT TERM GOAL #1   Title demonstrates updated HEP correctly (Target Date: 04/26/14)   Time 1  Period Months   Status Achieved   PT SHORT TERM GOAL #2   Title reaches 4" in standing with RW support with supervision (Target Date: 04/26/14)   Time 1   Period Months   Status Achieved   PT SHORT TERM GOAL #3   Title knee extension in standing with RW support -30 degrees bilaterally. (Target Date: 04/26/14)   Time 1   Period Months   Status Achieved           PT Long Term Goals - 03/27/14 1727    PT LONG TERM GOAL #1   Title verbalizes ongoing progressive HEP including fitness program at fitness club. (Target Date: 05/26/14)    Time 2   Period Months   Status New   PT LONG TERM GOAL #2   Title reports pain with gait /standing in hamstrings </= 2/10 (Target Date: 05/26/14)   Time 2   Period Months   Status New   PT LONG TERM GOAL #3   Title reaches 6" in standing with RW support safely. (Target Date: 05/26/14)   Time 2   Period Months   Status New   PT LONG TERM GOAL #4   Title ambulates 300' with RW safely with supervision. (Target Date: 05/26/14)   Time 2   Period Months   Status New   PT LONG TERM GOAL #5   Title Gait Velocity >0.5 ft/sec (Target Date: 05/26/14)   Time 2   Period Months           Plan - 05/17/14 1410    Clinical Impression Statement Pt making progress toward goals. Feels the standing frame helps stretch him out the best, however not sure how this will carryover to home. Primary PT checking into whether a  standing frame will be covered for home.                                  Pt will benefit from skilled therapeutic intervention in order to improve on the following deficits Abnormal gait;Decreased activity tolerance;Decreased balance;Decreased mobility;Decreased knowledge of use of DME;Decreased range of motion;Decreased strength;Impaired tone   Rehab Potential Good   Clinical Impairments Affecting Rehab Potential time since injury   PT Frequency 2x / week   PT Duration Other (comment)  9 weeks (2 months)   PT Treatment/Interventions ADLs/Self Care Home Management;Gait training;DME Instruction;Stair training;Functional mobility training;Therapeutic activities;Therapeutic exercise;Balance training;Neuromuscular re-education;Patient/family education;Manual techniques   PT Next Visit Plan Begin checking LTG's next visit to determine discharge vs renewal.   Consulted and Agree with Plan of Care Patient        Problem List Patient Active Problem List   Diagnosis Date Noted  . Spastic tetraplegia 12/06/2013  . Hematuria 11/05/2013  . Contact dermatitis 05/12/2013  . Rash of hands  05/12/2013  . Fatigue 11/16/2012  . Anemia 11/16/2012  . History of recurrent UTIs 05/19/2010  . Functional quadriplegia 05/10/2009  . HEMATURIA 07/29/2007  . Closed fracture of cervical vertebra 07/29/2007    Willow Ora 05/18/2014, 1:49 PM  Willow Ora, PTA, [facility] Outpatient Neuro [facility] [address], [place],  [address] [phone] 05/18/2014, 1:49 PM

## 2014-05-22 ENCOUNTER — Ambulatory Visit: Payer: Commercial Managed Care - HMO | Admitting: Physical Therapy

## 2014-05-22 ENCOUNTER — Encounter: Payer: Self-pay | Admitting: Physical Therapy

## 2014-05-22 ENCOUNTER — Ambulatory Visit: Payer: Commercial Managed Care - HMO

## 2014-05-22 DIAGNOSIS — R6889 Other general symptoms and signs: Secondary | ICD-10-CM | POA: Diagnosis not present

## 2014-05-22 DIAGNOSIS — R269 Unspecified abnormalities of gait and mobility: Secondary | ICD-10-CM

## 2014-05-22 DIAGNOSIS — G8250 Quadriplegia, unspecified: Secondary | ICD-10-CM | POA: Diagnosis not present

## 2014-05-22 DIAGNOSIS — M6289 Other specified disorders of muscle: Secondary | ICD-10-CM

## 2014-05-22 DIAGNOSIS — M6249 Contracture of muscle, multiple sites: Secondary | ICD-10-CM | POA: Diagnosis not present

## 2014-05-23 NOTE — Therapy (Signed)
[facility] [address] [place], [place], [address] Phone: [phone]   Fax:  [phone]  Physical Therapy Treatment  Patient Details  Name: Andrew Sullivan MRN: [id number] Date of Birth: October 19, 1970 Referring Provider:  Timmothy Euler, MD  Encounter Date: 05/22/2014      PT End of Session - 05/23/14 1149    Visit Number 16   Number of Visits 18   Date for PT Re-Evaluation 05/26/14   Authorization Type [employer]; G codes required   PT Start Time 1316   PT Stop Time 1400   PT Time Calculation (min) 44 min   Equipment Utilized During Treatment Gait belt   Activity Tolerance Patient tolerated treatment well   Behavior During Therapy [facility] for tasks assessed/performed      Past Medical History  Diagnosis Date  . Paraparesis of both lower limbs     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 42 in 60  . History of recurrent UTIs   . Erectile dysfunction   . Gross hematuria   . History of spinal cord injury     15 (age 32 fell from ladder) w/ cervical spine fx  s/p  c4 -- c6  fusion--  residual paraplegic bilateral legs (great below T8 uses w/c and can transfer self  . Spastic tetraplegia     Past Surgical History  Procedure Laterality Date  . Cervical fusion  1988    C4 -- C6 w/ spinal cord injury  . Hip arthroplasty  08/12/2011    Procedure: ARTHROPLASTY BIPOLAR HIP;  Surgeon: Mauri Pole, MD;  [facility];  Service: Orthopedics;  Laterality: Left;  Hemi-arthroplasty  . Cystoscopy with retrograde pyelogram, ureteroscopy and stent placement Left 12/27/2013    Procedure: [place], URETEROSCOPY AND STENT PLACEMENT;  Surgeon: Reece Packer, MD;  Location: [facility];  Service: Urology;  Laterality: Left;  . Cystoscopy/retrograde/ureteroscopy Left 01/09/2014    Procedure: CYSTOSCOPY/RETROGRADE/URETEROSCOPY, URETERAL BIOPSY AND STENT EXCHANGE;   Surgeon: Alexis Frock, MD;  [facility];  Service: Urology;  Laterality: Left;    There were no vitals filed for this visit.  Visit Diagnosis:  No diagnosis found.      Subjective Assessment - 05/22/14 1322    Symptoms No new complaints. No falls or pain. Doing his standing ex's at home without issues.   Currently in Pain? No/denies   Pain Score 0-No pain          [facility] Adult PT Treatment/Exercise - 05/23/14 0001    Ambulation/Gait   Ambulation/Gait Yes   Ambulation/Gait Assistance 5: Supervision   Ambulation/Gait Assistance Details cues on posture, increased knee extension with stand into toe off.   Ambulation Distance (Feet) 320 Feet  x1 rep, 120 feet x 2 reps   Assistive device Rolling walker   Gait Pattern Step-through pattern;Decreased step length - right;Decreased step length - left;Decreased stride length;Decreased hip/knee flexion - right;Decreased hip/knee flexion - left;Left hip hike;Right hip hike;Right flexed knee in stance;Left flexed knee in stance;Decreased trunk rotation;Trunk flexed;Poor foot clearance - left;Poor foot clearance - right   Ambulation Surface Level;Indoor   Gait velocity 71.18 sec's= 0.5 ft/sec with walker     Treatment: Exercise - passive stretching to bil hamstrings, heel cords and hip adductors, prolonged holds with gradual increases made. Performed prior to checking goals.        PT Short Term Goals - 04/28/14 1224    PT SHORT TERM GOAL #1  Title demonstrates updated HEP correctly (Target Date: 04/26/14)   Time 1   Period Months   Status Achieved   PT SHORT TERM GOAL #2   Title reaches 4" in standing with RW support with supervision (Target Date: 04/26/14)   Time 1   Period Months   Status Achieved   PT SHORT TERM GOAL #3   Title knee extension in standing with RW support -30 degrees bilaterally. (Target Date: 04/26/14)   Time 1   Period Months   Status Achieved           PT Long Term Goals - 05/23/14 1151    PT  LONG TERM GOAL #1   Title verbalizes ongoing progressive HEP including fitness program at fitness club. (Target Date: 05/26/14)   Time 2   Period Months   Status On-going   PT LONG TERM GOAL #2   Title reports pain with gait /standing in hamstrings </= 2/10 (Target Date: 05/26/14)   Baseline on 05/23/14   Time 2   Period Months   Status Achieved   PT LONG TERM GOAL #3   Title reaches 6" in standing with RW support safely. (Target Date: 05/26/14)   Time 2   Period Months   Status On-going   PT LONG TERM GOAL #4   Title ambulates 300' with RW safely with supervision. (Target Date: 05/26/14)   Baseline 05/22/14- 320 feet with walker with supervision   Time 2   Period Months   Status Achieved   PT LONG TERM GOAL #5   Title Gait Velocity >0.5 ft/sec (Target Date: 05/26/14)   Baseline 05/22/14- 0.5 ft/sec was achieved   Time 2   Period Months   Status Achieved           Plan - 05/23/14 1150    Clinical Impression Statement Pt has met LTGs 2, 4, and 5. Progressing toward others.   Pt will benefit from skilled therapeutic intervention in order to improve on the following deficits Abnormal gait;Decreased activity tolerance;Decreased balance;Decreased mobility;Decreased knowledge of use of DME;Decreased range of motion;Decreased strength;Impaired tone   Rehab Potential Good   Clinical Impairments Affecting Rehab Potential time since injury   PT Frequency 2x / week   PT Duration Other (comment)  9 weeks (2 months)   PT Treatment/Interventions ADLs/Self Care Home Management;Gait training;DME Instruction;Stair training;Functional mobility training;Therapeutic activities;Therapeutic exercise;Balance training;Neuromuscular re-education;Patient/family education;Manual techniques   PT Next Visit Plan Assess remaining LTG's. PT plans to renew.   Consulted and Agree with Plan of Care Patient        Problem List Patient Active Problem List   Diagnosis Date Noted  . Spastic tetraplegia  12/06/2013  . Hematuria 11/05/2013  . Contact dermatitis 05/12/2013  . Rash of hands 05/12/2013  . Fatigue 11/16/2012  . Anemia 11/16/2012  . History of recurrent UTIs 05/19/2010  . Functional quadriplegia 05/10/2009  . HEMATURIA 07/29/2007  . Closed fracture of cervical vertebra 07/29/2007    Willow Ora 05/23/2014, 11:55 AM  Willow Ora, PTA, [facility] Outpatient Neuro [facility] [address], [place], [place] [address] [phone] 05/23/2014, 10:24 PM

## 2014-05-24 ENCOUNTER — Encounter: Payer: Self-pay | Admitting: Physical Therapy

## 2014-05-24 ENCOUNTER — Ambulatory Visit: Payer: Commercial Managed Care - HMO | Admitting: Physical Therapy

## 2014-05-24 DIAGNOSIS — G8250 Quadriplegia, unspecified: Secondary | ICD-10-CM

## 2014-05-24 DIAGNOSIS — R6889 Other general symptoms and signs: Secondary | ICD-10-CM | POA: Diagnosis not present

## 2014-05-24 DIAGNOSIS — R269 Unspecified abnormalities of gait and mobility: Secondary | ICD-10-CM

## 2014-05-24 DIAGNOSIS — IMO0001 Reserved for inherently not codable concepts without codable children: Secondary | ICD-10-CM

## 2014-05-24 DIAGNOSIS — M6249 Contracture of muscle, multiple sites: Secondary | ICD-10-CM | POA: Diagnosis not present

## 2014-05-24 DIAGNOSIS — M6289 Other specified disorders of muscle: Secondary | ICD-10-CM

## 2014-05-24 NOTE — Therapy (Signed)
[facility] [address] [place], [place], [address] Phone: [phone]   Fax:  [phone]  Physical Therapy Treatment  Patient Details  Name: Andrew Sullivan MRN: [id number] Date of Birth: [birth year] Referring Provider:  Timmothy Euler, MD  Encounter Date: 05/24/2014      PT End of Session - 05/24/14 1540    Visit Number 17   Number of Visits 18   Date for PT Re-Evaluation 05/26/14   Authorization Type [employer]; G codes required   PT Start Time 1533   PT Stop Time 1615   PT Time Calculation (min) 42 min   Equipment Utilized During Treatment Gait belt   Activity Tolerance Patient tolerated treatment well   Behavior During Therapy [facility] for tasks assessed/performed      Past Medical History  Diagnosis Date  . Paraparesis of both lower limbs     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 20 in 58  . History of recurrent UTIs   . Erectile dysfunction   . Gross hematuria   . History of spinal cord injury     51 (age 43 fell from ladder) w/ cervical spine fx  s/p  c4 -- c6  fusion--  residual paraplegic bilateral legs (great below T8 uses w/c and can transfer self  . Spastic tetraplegia     Past Surgical History  Procedure Laterality Date  . Cervical fusion  1988    C4 -- C6 w/ spinal cord injury  . Hip arthroplasty  08/12/2011    Procedure: ARTHROPLASTY BIPOLAR HIP;  Surgeon: Mauri Pole, MD;  [facility];  Service: Orthopedics;  Laterality: Left;  Hemi-arthroplasty  . Cystoscopy with retrograde pyelogram, ureteroscopy and stent placement Left 12/27/2013    Procedure: [place], URETEROSCOPY AND STENT PLACEMENT;  Surgeon: Reece Packer, MD;  Location: [facility];  Service: Urology;  Laterality: Left;  . Cystoscopy/retrograde/ureteroscopy Left 01/09/2014    Procedure: CYSTOSCOPY/RETROGRADE/URETEROSCOPY, URETERAL BIOPSY AND STENT EXCHANGE;   Surgeon: Alexis Frock, MD;  [facility];  Service: Urology;  Laterality: Left;    There were no vitals filed for this visit.  Visit Diagnosis:  Abnormality of gait  Activity intolerance      Subjective Assessment - 05/24/14 1539    Symptoms No new complaints. No falls or pain. Doing his standing ex's at home without issues.   Currently in Pain? Yes   Pain Score 1    Pain Location Leg  hamstrings   Pain Orientation Right;Left   Pain Descriptors / Indicators Sore;Tightness   Pain Type Chronic pain   Pain Onset More than a month ago   Aggravating Factors  standing   Pain Relieving Factors stretching, rest      Treatment: Exercise Passive heel cord, hamstring, and hip adductor stretching to bil legs with prolonged holds.  With walker: pt able to reach 6 inches forward with min assist at 6 inch mark for balance. Only able to reach 4 inches with supervision safely.   Standing active knee extension: right - 10 from neutral, left - 17 from neutral  Gait: - 100 feet lobby to gym with min guard assist with cues on knee extension with push off and stance.  -216 feet with walker with min guard assist with cues for knee extension with stance to push off with gait (160 feet in 6 minutes). Less cues needed this go round after stretching.   - 100 feet gym to lobby with  stand by assist and less cues needed on knee extension with gait.          PT Short Term Goals - 04/28/14 1224    PT SHORT TERM GOAL #1   Title demonstrates updated HEP correctly (Target Date: 04/26/14)   Time 1   Period Months   Status Achieved   PT SHORT TERM GOAL #2   Title reaches 4" in standing with RW support with supervision (Target Date: 04/26/14)   Time 1   Period Months   Status Achieved   PT SHORT TERM GOAL #3   Title knee extension in standing with RW support -30 degrees bilaterally. (Target Date: 04/26/14)   Time 1   Period Months   Status Achieved           PT Long Term Goals -  05/25/14 1613    PT LONG TERM GOAL #1   Title verbalizes ongoing progressive HEP including fitness program at fitness club. (Target Date: 05/26/14)   Time 2   Period Months   Status On-going   PT LONG TERM GOAL #2   Title reports pain with gait /standing in hamstrings </= 2/10 (Target Date: 05/26/14)   Baseline on 05/23/14   Time 2   Period Months   Status Achieved   PT LONG TERM GOAL #3   Title reaches 6" in standing with RW support safely. (Target Date: 05/26/14)   Baseline only able to reach 4 inches safely with walker support- 05/24/14   Time 2   Period Months   Status Not Met   PT LONG TERM GOAL #4   Title ambulates 300' with RW safely with supervision. (Target Date: 05/26/14)   Baseline 05/22/14- 320 feet with walker with supervision   Time 2   Period Months   Status Achieved   PT LONG TERM GOAL #5   Title Gait Velocity >0.5 ft/sec (Target Date: 05/26/14)   Baseline 05/22/14- 0.5 ft/sec was achieved   Time 2   Period Months   Status Achieved           Plan - 05/24/14 1540    Clinical Impression Statement Remaining LTG's not met, pt is progressing toward them.   Pt will benefit from skilled therapeutic intervention in order to improve on the following deficits Abnormal gait;Decreased activity tolerance;Decreased balance;Decreased mobility;Decreased knowledge of use of DME;Decreased range of motion;Decreased strength;Impaired tone   Rehab Potential Good   Clinical Impairments Affecting Rehab Potential time since injury   PT Frequency 2x / week   PT Duration Other (comment)  9 weeks (2 months)   PT Treatment/Interventions ADLs/Self Care Home Management;Gait training;DME Instruction;Stair training;Functional mobility training;Therapeutic activities;Therapeutic exercise;Balance training;Neuromuscular re-education;Patient/family education;Manual techniques   PT Next Visit Plan PT to complete renewal; continue per that plan of care towards those goals   Consulted and Agree with  Plan of Care Patient        Problem List Patient Active Problem List   Diagnosis Date Noted  . Spastic tetraplegia 12/06/2013  . Hematuria 11/05/2013  . Contact dermatitis 05/12/2013  . Rash of hands 05/12/2013  . Fatigue 11/16/2012  . Anemia 11/16/2012  . History of recurrent UTIs 05/19/2010  . Functional quadriplegia 05/10/2009  . HEMATURIA 07/29/2007  . Closed fracture of cervical vertebra 07/29/2007    Willow Ora 05/25/2014, 4:15 PM  Willow Ora, PTA, [facility] [address], [place], [place] [address] [phone] 05/25/2014, 4:15 PM

## 2014-05-25 NOTE — Therapy (Signed)
[facility] [address] [place], [place], [address] Phone: [phone]   Fax:  [phone]  Physical Therapy Treatment  Patient Details  Name: Andrew Sullivan MRN: [id number] Date of Birth: Jul 08, 1970 Referring Provider:  Timmothy Euler, MD  Encounter Date: 05/24/2014      PT End of Session - 05/24/14 1540    Visit Number 17   Number of Visits 18   Date for PT Re-Evaluation 05/26/14   Authorization Type [employer]; G codes required   PT Start Time 1533   PT Stop Time 1615   PT Time Calculation (min) 42 min   Equipment Utilized During Treatment Gait belt   Activity Tolerance Patient tolerated treatment well   Behavior During Therapy [name] for tasks assessed/performed      Past Medical History  Diagnosis Date  . Paraparesis of both lower limbs     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 44 in 89  . History of recurrent UTIs   . Erectile dysfunction   . Gross hematuria   . History of spinal cord injury     54 (age 52 fell from ladder) w/ cervical spine fx  s/p  c4 -- c6  fusion--  residual paraplegic bilateral legs (great below T8 uses w/c and can transfer self  . Spastic tetraplegia     Past Surgical History  Procedure Laterality Date  . Cervical fusion  1988    C4 -- C6 w/ spinal cord injury  . Hip arthroplasty  08/12/2011    Procedure: ARTHROPLASTY BIPOLAR HIP;  Surgeon: Mauri Pole, MD;  Location: [facility];  Service: Orthopedics;  Laterality: Left;  Hemi-arthroplasty  . Cystoscopy with retrograde pyelogram, ureteroscopy and stent placement Left 12/27/2013    Procedure: [place], URETEROSCOPY AND STENT PLACEMENT;  Surgeon: Reece Packer, MD;  Location: [facility];  Service: Urology;  Laterality: Left;  . Cystoscopy/retrograde/ureteroscopy Left 01/09/2014    Procedure: CYSTOSCOPY/RETROGRADE/URETEROSCOPY, URETERAL BIOPSY AND STENT EXCHANGE;   Surgeon: Alexis Frock, MD;  [facility];  Service: Urology;  Laterality: Left;    There were no vitals filed for this visit.  Visit Diagnosis:  Abnormality of gait  Activity intolerance  Quadriparesis (muscle weakness)  Muscle hypertonicity      Subjective Assessment - 05/24/14 1539    Symptoms No new complaints. No falls or pain. Doing his standing ex's at home without issues.   Currently in Pain? Yes   Pain Score 1    Pain Location Leg  hamstrings   Pain Orientation Right;Left   Pain Descriptors / Indicators Sore;Tightness   Pain Type Chronic pain   Pain Onset More than a month ago   Aggravating Factors  standing   Pain Relieving Factors stretching, rest                                 PT Short Term Goals - 05/25/14 2232    PT SHORT TERM GOAL #1   Title demonstrates updated HEP correctly (Target Date: 04/26/14)   Time 1   Period Months   Status Achieved   PT SHORT TERM GOAL #2   Title reaches 4" in standing with RW support with supervision (Target Date: 04/26/14)   Time 1   Period Months   Status Achieved   PT SHORT TERM GOAL #3   Title knee extension in standing with RW support -30 degrees bilaterally. (  Target Date: 04/26/14)   Time 1   Period Months   Status Achieved   PT SHORT TERM GOAL #4   Title demonstrates understanding of updated HEP including exercises at fitness center (Target Date: 06/23/14)   Time 1   Period Months   Status New   PT SHORT TERM GOAL #5   Title Gait Velocity >0.55 ft/sec (Target Date: 06/23/14)   Time 1   Period Months   Status New   Additional Short Term Goals   Additional Short Term Goals Yes   PT SHORT TERM GOAL #6   Title reaches 6" with walker support without assist with supervision. (Target Date: 06/23/14)   Time 1   Period Months   Status New           PT Long Term Goals - 05/25/14 1613    PT LONG TERM GOAL #1   Title verbalizes ongoing progressive HEP including fitness program at  fitness club. (NEW Target Date: 07/21/14)   Baseline partially met with HEP instructed to date 05/24/2014   Time 2   Period Months   Status On-going   PT LONG TERM GOAL #2   Title reports pain with gait /standing in hamstrings </= 2/10 (Target Date: 05/26/14)   Baseline on 05/24/14 reports pain 1/10 with activity   Time 2   Period Months   Status Achieved   PT LONG TERM GOAL #3   Title reaches 6" in standing with RW support safely. (NEW Target Date: 07/21/14)   Baseline only able to reach 4 inches safely with walker support- 05/24/14   Time 2   Period Months   Status On-going   PT LONG TERM GOAL #4   Title ambulates 300' with RW safely modified independent. (Target Date: 05/26/14)   Baseline 05/22/14- 320 feet with walker with supervision   Time 2   Period Months   Status Achieved   PT LONG TERM GOAL #5   Title Gait Velocity >0.5 ft/sec (Target Date: 05/26/14)   Baseline 05/22/14- 0.5 ft/sec was achieved   Time 2   Period Months   Status Achieved   Additional Long Term Goals   Additional Long Term Goals Yes   PT LONG TERM GOAL #6   Title Patient ambulates 350' with rolling walker modified independent. (Target Date: 07/21/14)   Time 2   Period Months   Status New   PT LONG TERM GOAL #7   Title 6-minute Walk Test with rolling walker >180' (Target Date: 07/21/14)   Time 2   Period Months   Status New   PT LONG TERM GOAL #8   Title standing active knee extension right > -7, left > -13 degrees (Target Date: 07/21/14)   Time 2   Period Months   Status New               Plan - 05/24/14 1540    Clinical Impression Statement Remaining LTG's not met, pt is progressing toward them.  Jamey Reas, PT, DPT added patient has made good progress and is now able to ambulate longer distances without rest periods. For example he required rest for >15 mintues in lobby before and after PT sessions initially. Now he reports he rests in car before driving off but not longer needs to rest in  lobby. He also reports able to stand in line longer periods now. He has potential to imrprove further with more skilled care.   Pt will benefit from skilled therapeutic intervention in order to  improve on the following deficits Abnormal gait;Decreased activity tolerance;Decreased balance;Decreased mobility;Decreased knowledge of use of DME;Decreased range of motion;Decreased strength;Impaired tone   Rehab Potential Good   Clinical Impairments Affecting Rehab Potential time since injury   PT Frequency 2x / week   PT Duration 8 weeks  9 weeks (2 months)   PT Treatment/Interventions ADLs/Self Care Home Management;Gait training;DME Instruction;Stair training;Functional mobility training;Therapeutic activities;Therapeutic exercise;Balance training;Neuromuscular re-education;Patient/family education;Manual techniques   PT Next Visit Plan PT to complete renewal; continue per that plan of care towards those goals   Consulted and Agree with Plan of Care Patient        Problem List Patient Active Problem List   Diagnosis Date Noted  . Spastic tetraplegia 12/06/2013  . Hematuria 11/05/2013  . Contact dermatitis 05/12/2013  . Rash of hands 05/12/2013  . Fatigue 11/16/2012  . Anemia 11/16/2012  . History of recurrent UTIs 05/19/2010  . Functional quadriplegia 05/10/2009  . HEMATURIA 07/29/2007  . Closed fracture of cervical vertebra 07/29/2007    Yarisa Lynam PT, DPT 05/25/2014, 10:41 PM  [facility] [address] [place], [place], [address] Phone: [phone]   Fax:  [phone]

## 2014-05-31 ENCOUNTER — Ambulatory Visit: Payer: Commercial Managed Care - HMO

## 2014-05-31 DIAGNOSIS — G8250 Quadriplegia, unspecified: Secondary | ICD-10-CM

## 2014-05-31 DIAGNOSIS — R6889 Other general symptoms and signs: Secondary | ICD-10-CM | POA: Diagnosis not present

## 2014-05-31 DIAGNOSIS — R269 Unspecified abnormalities of gait and mobility: Secondary | ICD-10-CM

## 2014-05-31 DIAGNOSIS — M6249 Contracture of muscle, multiple sites: Secondary | ICD-10-CM | POA: Diagnosis not present

## 2014-05-31 DIAGNOSIS — IMO0001 Reserved for inherently not codable concepts without codable children: Secondary | ICD-10-CM

## 2014-05-31 NOTE — Therapy (Signed)
[facility] [address] [place], [place], [address] Phone: [phone]   Fax:  [phone]  Physical Therapy Treatment  Patient Details  Name: Andrew Sullivan MRN: [id number] Date of Birth: 1970/05/22 Referring Provider:  Timmothy Euler, MD  Encounter Date: 05/31/2014    Past Medical History  Diagnosis Date  . Paraparesis of both lower limbs     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 44 in 40  . History of recurrent UTIs   . Erectile dysfunction   . Gross hematuria   . History of spinal cord injury     6 (age 19 fell from ladder) w/ cervical spine fx  s/p  c4 -- c6  fusion--  residual paraplegic bilateral legs (great below T8 uses w/c and can transfer self  . Spastic tetraplegia     Past Surgical History  Procedure Laterality Date  . Cervical fusion  1988    C4 -- C6 w/ spinal cord injury  . Hip arthroplasty  08/12/2011    Procedure: ARTHROPLASTY BIPOLAR HIP;  Surgeon: Mauri Pole, MD;  [facility];  Service: Orthopedics;  Laterality: Left;  Hemi-arthroplasty  . Cystoscopy with retrograde pyelogram, ureteroscopy and stent placement Left 12/27/2013    Procedure: [place], URETEROSCOPY AND STENT PLACEMENT;  Surgeon: Reece Packer, MD;  Location: [facility];  Service: Urology;  Laterality: Left;  . Cystoscopy/retrograde/ureteroscopy Left 01/09/2014    Procedure: CYSTOSCOPY/RETROGRADE/URETEROSCOPY, URETERAL BIOPSY AND STENT EXCHANGE;  Surgeon: Alexis Frock, MD;  [facility];  Service: Urology;  Laterality: Left;    There were no vitals filed for this visit.  Visit Diagnosis:  No diagnosis found.      Subjective Assessment - 05/31/14 0818    Symptoms No new complaints. No falls.   Currently in Pain? Yes   Pain Score 3    Pain Location Leg   Pain Orientation Right;Left;Posterior   Pain Descriptors / Indicators Sore   Aggravating  Factors  walking   Pain Relieving Factors stretching     Gait training x 283' with RW and MOD I for initial 200' then supervision for final 83' due to fatigue and decreased stability.   Therex: Seated hamstring stretch each leg using crutch to pull up on leg   Leg press BLE 50# 3x8 (back of seat fully reclined), small ball used to keep knees apart, verbal cues for eccentric control. After first three sets, weight was increased to 60# and pt performed 5 reps.  LLE terminal knee extension with green theraband. Pt currently uses BUE a lot to compensate and performs more of a squat. PT explained how to perform terminal knee extension properly then session ended.                            PT Short Term Goals - 05/25/14 2232    PT SHORT TERM GOAL #1   Title demonstrates updated HEP correctly (Target Date: 04/26/14)   Time 1   Period Months   Status Achieved   PT SHORT TERM GOAL #2   Title reaches 4" in standing with RW support with supervision (Target Date: 04/26/14)   Time 1   Period Months   Status Achieved   PT SHORT TERM GOAL #3   Title knee extension in standing with RW support -30 degrees bilaterally. (Target Date: 04/26/14)   Time 1   Period Months   Status Achieved  PT SHORT TERM GOAL #4   Title demonstrates understanding of updated HEP including exercises at fitness center (Target Date: 06/23/14)   Time 1   Period Months   Status New   PT SHORT TERM GOAL #5   Title Gait Velocity >0.55 ft/sec (Target Date: 06/23/14)   Time 1   Period Months   Status New   Additional Short Term Goals   Additional Short Term Goals Yes   PT SHORT TERM GOAL #6   Title reaches 6" with walker support without assist with supervision. (Target Date: 06/23/14)   Time 1   Period Months   Status New           PT Long Term Goals - 05/25/14 1613    PT LONG TERM GOAL #1   Title verbalizes ongoing progressive HEP including fitness program at fitness club. (NEW Target Date:  07/21/14)   Baseline partially met with HEP instructed to date 05/24/2014   Time 2   Period Months   Status On-going   PT LONG TERM GOAL #2   Title reports pain with gait /standing in hamstrings </= 2/10 (Target Date: 05/26/14)   Baseline on 05/24/14 reports pain 1/10 with activity   Time 2   Period Months   Status Achieved   PT LONG TERM GOAL #3   Title reaches 6" in standing with RW support safely. (NEW Target Date: 07/21/14)   Baseline only able to reach 4 inches safely with walker support- 05/24/14   Time 2   Period Months   Status On-going   PT LONG TERM GOAL #4   Title ambulates 300' with RW safely modified independent. (Target Date: 05/26/14)   Baseline 05/22/14- 320 feet with walker with supervision   Time 2   Period Months   Status Achieved   PT LONG TERM GOAL #5   Title Gait Velocity >0.5 ft/sec (Target Date: 05/26/14)   Baseline 05/22/14- 0.5 ft/sec was achieved   Time 2   Period Months   Status Achieved   Additional Long Term Goals   Additional Long Term Goals Yes   PT LONG TERM GOAL #6   Title Patient ambulates 350' with rolling walker modified independent. (Target Date: 07/21/14)   Time 2   Period Months   Status New   PT LONG TERM GOAL #7   Title 6-minute Walk Test with rolling walker >180' (Target Date: 07/21/14)   Time 2   Period Months   Status New   PT LONG TERM GOAL #8   Title standing active knee extension right > -7, left > -13 degrees (Target Date: 07/21/14)   Time 2   Period Months   Status New               Problem List Patient Active Problem List   Diagnosis Date Noted  . Spastic tetraplegia 12/06/2013  . Hematuria 11/05/2013  . Contact dermatitis 05/12/2013  . Rash of hands 05/12/2013  . Fatigue 11/16/2012  . Anemia 11/16/2012  . History of recurrent UTIs 05/19/2010  . Functional quadriplegia 05/10/2009  . HEMATURIA 07/29/2007  . Closed fracture of cervical vertebra 07/29/2007   Delrae Sawyers, PT,DPT,NCS 05/31/2014 10:01  AM Phone [phone] FAX [phone]         [facility] [address] [place], [place], [address] Phone: [phone]   Fax:  [phone]

## 2014-06-01 ENCOUNTER — Ambulatory Visit: Payer: Commercial Managed Care - HMO

## 2014-06-01 DIAGNOSIS — G8250 Quadriplegia, unspecified: Secondary | ICD-10-CM | POA: Diagnosis not present

## 2014-06-01 DIAGNOSIS — R269 Unspecified abnormalities of gait and mobility: Secondary | ICD-10-CM

## 2014-06-01 DIAGNOSIS — IMO0001 Reserved for inherently not codable concepts without codable children: Secondary | ICD-10-CM

## 2014-06-01 DIAGNOSIS — M6289 Other specified disorders of muscle: Secondary | ICD-10-CM

## 2014-06-01 DIAGNOSIS — M6249 Contracture of muscle, multiple sites: Secondary | ICD-10-CM | POA: Diagnosis not present

## 2014-06-01 DIAGNOSIS — R6889 Other general symptoms and signs: Secondary | ICD-10-CM | POA: Diagnosis not present

## 2014-06-01 NOTE — Therapy (Signed)
[facility] [address] [place], [place], [address] Phone: [phone]   Fax:  [phone]  Physical Therapy Treatment  Patient Details  Name: Andrew Sullivan MRN: [id number] Date of Birth: 1970-07-31 Referring Provider:  Timmothy Euler, MD  Encounter Date: 06/01/2014      PT End of Session - 06/01/14 1422    Visit Number 19   Number of Visits 33   Date for PT Re-Evaluation 07/21/14   Authorization Type [employer]; G codes required   PT Start Time 2637   PT Stop Time 1417   PT Time Calculation (min) 44 min      Past Medical History  Diagnosis Date  . Paraparesis of both lower limbs     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 88 in 71  . History of recurrent UTIs   . Erectile dysfunction   . Gross hematuria   . History of spinal cord injury     1 (age 22 fell from ladder) w/ cervical spine fx  s/p  c4 -- c6  fusion--  residual paraplegic bilateral legs (great below T8 uses w/c and can transfer self  . Spastic tetraplegia     Past Surgical History  Procedure Laterality Date  . Cervical fusion  1988    C4 -- C6 w/ spinal cord injury  . Hip arthroplasty  08/12/2011    Procedure: ARTHROPLASTY BIPOLAR HIP;  Surgeon: Mauri Pole, MD;  Location: [facility];  Service: Orthopedics;  Laterality: Left;  Hemi-arthroplasty  . Cystoscopy with retrograde pyelogram, ureteroscopy and stent placement Left 12/27/2013    Procedure: [place], URETEROSCOPY AND STENT PLACEMENT;  Surgeon: Reece Packer, MD;  Location: [facility];  Service: Urology;  Laterality: Left;  . Cystoscopy/retrograde/ureteroscopy Left 01/09/2014    Procedure: CYSTOSCOPY/RETROGRADE/URETEROSCOPY, URETERAL BIOPSY AND STENT EXCHANGE;  Surgeon: Alexis Frock, MD;  [facility];  Service: Urology;  Laterality: Left;    There were no vitals filed for this visit.  Visit Diagnosis:  Abnormality  of gait  Muscle hypertonicity  Quadriparesis (muscle weakness)      Subjective Assessment - 06/01/14 1339    Symptoms Pt reports feeling fine after trying the leg press yesterday. No muscle soreness.   Currently in Pain? Yes   Pain Score 1    Pain Location Leg   Pain Orientation Right;Left;Posterior   Pain Descriptors / Indicators Sore   Pain Type Chronic pain      Gait training with RW x100' with MOD I on indoor level surfaces, later 2x50'  Leg press at 60# with BLE 3x10 with good quads control noted. Small ball between knees to decrease hip adduction; then 1x10 on 70#. Verbal cues to maintain core activatoin  Terminal knee extension with green theraband with RW for standing stability and mirror + verbal cues for attempting correct form . Pt unable to perform isolated single leg terminal knee extension with contralateral LE maintained in extended position. Possibly due to difficulty with reciprocal motion as well as pt's muscle weakness and spasiticyt.   Attempted terminal knee extension unilateral in supine with knees propped with varying bolster heights. Pt unable to activate quads, and flexor tone was overpowering.  Supine prolonged bilateral hamstring stretch with bolster supporting both ankles. Performed x4 minutes.   Pt then able to perform 3x10 terminal knee extension in supine against gravity with BLE propped on bolster with tapping on quads for tactile facilitation.  Facilitated pt for anterior/posterior pelvic  tilts edge of mat x10. Pt then able to perform without facilitation.                        PT Education - 06/01/14 1422    Education provided Yes   Education Details supine hamstring stretch and anterior pelvic tilts   Person(s) Educated Patient   Methods Explanation;Demonstration   Comprehension Verbalized understanding;Returned demonstration          PT Short Term Goals - 05/25/14 2232    PT SHORT TERM GOAL #1   Title demonstrates  updated HEP correctly (Target Date: 04/26/14)   Time 1   Period Months   Status Achieved   PT SHORT TERM GOAL #2   Title reaches 4" in standing with RW support with supervision (Target Date: 04/26/14)   Time 1   Period Months   Status Achieved   PT SHORT TERM GOAL #3   Title knee extension in standing with RW support -30 degrees bilaterally. (Target Date: 04/26/14)   Time 1   Period Months   Status Achieved   PT SHORT TERM GOAL #4   Title demonstrates understanding of updated HEP including exercises at fitness center (Target Date: 06/23/14)   Time 1   Period Months   Status New   PT SHORT TERM GOAL #5   Title Gait Velocity >0.55 ft/sec (Target Date: 06/23/14)   Time 1   Period Months   Status New   Additional Short Term Goals   Additional Short Term Goals Yes   PT SHORT TERM GOAL #6   Title reaches 6" with walker support without assist with supervision. (Target Date: 06/23/14)   Time 1   Period Months   Status New           PT Long Term Goals - 05/25/14 1613    PT LONG TERM GOAL #1   Title verbalizes ongoing progressive HEP including fitness program at fitness club. (NEW Target Date: 07/21/14)   Baseline partially met with HEP instructed to date 05/24/2014   Time 2   Period Months   Status On-going   PT LONG TERM GOAL #2   Title reports pain with gait /standing in hamstrings </= 2/10 (Target Date: 05/26/14)   Baseline on 05/24/14 reports pain 1/10 with activity   Time 2   Period Months   Status Achieved   PT LONG TERM GOAL #3   Title reaches 6" in standing with RW support safely. (NEW Target Date: 07/21/14)   Baseline only able to reach 4 inches safely with walker support- 05/24/14   Time 2   Period Months   Status On-going   PT LONG TERM GOAL #4   Title ambulates 300' with RW safely modified independent. (Target Date: 05/26/14)   Baseline 05/22/14- 320 feet with walker with supervision   Time 2   Period Months   Status Achieved   PT LONG TERM GOAL #5   Title Gait  Velocity >0.5 ft/sec (Target Date: 05/26/14)   Baseline 05/22/14- 0.5 ft/sec was achieved   Time 2   Period Months   Status Achieved   Additional Long Term Goals   Additional Long Term Goals Yes   PT LONG TERM GOAL #6   Title Patient ambulates 350' with rolling walker modified independent. (Target Date: 07/21/14)   Time 2   Period Months   Status New   PT LONG TERM GOAL #7   Title 6-minute Walk Test with rolling walker >180' (Target  Date: 07/21/14)   Time 2   Period Months   Status New   PT LONG TERM GOAL #8   Title standing active knee extension right > -7, left > -13 degrees (Target Date: 07/21/14)   Time 2   Period Months   Status New               Plan - 06/01/14 1423    Clinical Impression Statement Pt demonstrated increased quad strength today with increased weight on leg press. Also responded very well to supine hamstring stretch with BLE propped on bolster to simulate propping BLE on couch leg rest. Folowing this hamstring stretch pt was able to perform terminal knee extension whereas before the stretch, he was not able to perform this.    PT Next Visit Plan *g code* Increase leg press weight to 70#. Ask how the couch hamstring stretch is going. Hip abductor strengthening.        Problem List Patient Active Problem List   Diagnosis Date Noted  . Spastic tetraplegia 12/06/2013  . Hematuria 11/05/2013  . Contact dermatitis 05/12/2013  . Rash of hands 05/12/2013  . Fatigue 11/16/2012  . Anemia 11/16/2012  . History of recurrent UTIs 05/19/2010  . Functional quadriplegia 05/10/2009  . HEMATURIA 07/29/2007  . Closed fracture of cervical vertebra 07/29/2007   Delrae Sawyers, PT,DPT,NCS 06/01/2014 2:36 PM Phone [phone] FAX [phone]         [facility] [address] [place], [place], [address] Phone: [phone]   Fax:  [phone]

## 2014-06-01 NOTE — Patient Instructions (Signed)
Hamstring Stretch: Sit on the middle of your couch. Place both legs so they are propped at your ankles on the armrest of the couch and then lay down with your head at the other end. Stay in this position for at least 3 minutes as long as the stretch is gentle, or as long as you want--while watching TV or a movie or just resting.  *if you begin to have pain behind your knees with this, you can put a towel roll just under your kness, but don't create a bend in the knee.  PELVIC TILT: Anterior   Start in slumped position. Roll pelvis forward to arch back. 10 reps per set, 2 sets per day, 5-7 days per week   Copyright  VHI. All rights reserved.

## 2014-06-08 ENCOUNTER — Ambulatory Visit: Payer: Commercial Managed Care - HMO

## 2014-06-08 DIAGNOSIS — R269 Unspecified abnormalities of gait and mobility: Secondary | ICD-10-CM

## 2014-06-08 DIAGNOSIS — M6249 Contracture of muscle, multiple sites: Secondary | ICD-10-CM | POA: Diagnosis not present

## 2014-06-08 DIAGNOSIS — G8250 Quadriplegia, unspecified: Secondary | ICD-10-CM | POA: Diagnosis not present

## 2014-06-08 DIAGNOSIS — R6889 Other general symptoms and signs: Secondary | ICD-10-CM | POA: Diagnosis not present

## 2014-06-08 DIAGNOSIS — M6289 Other specified disorders of muscle: Secondary | ICD-10-CM

## 2014-06-08 DIAGNOSIS — IMO0001 Reserved for inherently not codable concepts without codable children: Secondary | ICD-10-CM

## 2014-06-08 NOTE — Therapy (Signed)
[facility] [address] [place], [place], [address] Phone: [phone]   Fax:  [phone]  Physical Therapy Treatment  Patient Details  Name: Andrew Sullivan MRN: [id number] Date of Birth: 13-Jun-1970 Referring Provider:  Timmothy Euler, MD  Encounter Date: 06/08/2014      PT End of Session - 06/08/14 1456    Visit Number 20   Number of Visits 33   Date for PT Re-Evaluation 07/21/14   Authorization Type [employer]; G codes required   PT Start Time 1315   PT Stop Time 1400   PT Time Calculation (min) 45 min      Past Medical History  Diagnosis Date  . Paraparesis of both lower limbs     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 34 in 61  . History of recurrent UTIs   . Erectile dysfunction   . Gross hematuria   . History of spinal cord injury     51 (age 6 fell from ladder) w/ cervical spine fx  s/p  c4 -- c6  fusion--  residual paraplegic bilateral legs (great below T8 uses w/c and can transfer self  . Spastic tetraplegia     Past Surgical History  Procedure Laterality Date  . Cervical fusion  1988    C4 -- C6 w/ spinal cord injury  . Hip arthroplasty  08/12/2011    Procedure: ARTHROPLASTY BIPOLAR HIP;  Surgeon: Mauri Pole, MD;  Location: [facility];  Service: Orthopedics;  Laterality: Left;  Hemi-arthroplasty  . Cystoscopy with retrograde pyelogram, ureteroscopy and stent placement Left 12/27/2013    Procedure: [place], URETEROSCOPY AND STENT PLACEMENT;  Surgeon: Reece Packer, MD;  Location: [facility];  Service: Urology;  Laterality: Left;  . Cystoscopy/retrograde/ureteroscopy Left 01/09/2014    Procedure: CYSTOSCOPY/RETROGRADE/URETEROSCOPY, URETERAL BIOPSY AND STENT EXCHANGE;  Surgeon: Alexis Frock, MD;  Location: [facility];  Service: Urology;  Laterality: Left;    There were no vitals filed for this visit.  Visit Diagnosis:  Abnormality  of gait  Quadriparesis (muscle weakness)  Muscle hypertonicity      Subjective Assessment - 06/08/14 1456    Symptoms Pt was taking a nap in waiting room at start of therapy. No falls or complaints   Currently in Pain? No/denies      Standing-with bilateral knee extension with legs against chair and BUE support on wall 3x2-3 minutes each for decreasing tone and prolonged stretch.  Leg press 1x10 at 60# then 3x10 at 70#  Supine passive hip adductor stretch  hooklying hip abductor strengthening--attempted with red t-band, attempted with minimal manual resistance, then pt demonstrated ability to perform without resistance 3x8    Gait training x100' with RW and MOD I--very slow gait speed, severe scissoring and continues to require excessive use of BUE to support body weight, gait training again later with RW and MOD I with gait speed: 0.46 ft/sec                             PT Short Term Goals - 05/25/14 2232    PT SHORT TERM GOAL #1   Title demonstrates updated HEP correctly (Target Date: 04/26/14)   Time 1   Period Months   Status Achieved   PT SHORT TERM GOAL #2   Title reaches 4" in standing with RW support with supervision (Target Date: 04/26/14)   Time 1   Period Months  Status Achieved   PT SHORT TERM GOAL #3   Title knee extension in standing with RW support -30 degrees bilaterally. (Target Date: 04/26/14)   Time 1   Period Months   Status Achieved   PT SHORT TERM GOAL #4   Title demonstrates understanding of updated HEP including exercises at fitness center (Target Date: 06/23/14)   Time 1   Period Months   Status New   PT SHORT TERM GOAL #5   Title Gait Velocity >0.55 ft/sec (Target Date: 06/23/14)   Time 1   Period Months   Status New   Additional Short Term Goals   Additional Short Term Goals Yes   PT SHORT TERM GOAL #6   Title reaches 6" with walker support without assist with supervision. (Target Date: 06/23/14)   Time 1   Period  Months   Status New           PT Long Term Goals - 05/25/14 1613    PT LONG TERM GOAL #1   Title verbalizes ongoing progressive HEP including fitness program at fitness club. (NEW Target Date: 07/21/14)   Baseline partially met with HEP instructed to date 05/24/2014   Time 2   Period Months   Status On-going   PT LONG TERM GOAL #2   Title reports pain with gait /standing in hamstrings </= 2/10 (Target Date: 05/26/14)   Baseline on 05/24/14 reports pain 1/10 with activity   Time 2   Period Months   Status Achieved   PT LONG TERM GOAL #3   Title reaches 6" in standing with RW support safely. (NEW Target Date: 07/21/14)   Baseline only able to reach 4 inches safely with walker support- 05/24/14   Time 2   Period Months   Status On-going   PT LONG TERM GOAL #4   Title ambulates 300' with RW safely modified independent. (Target Date: 05/26/14)   Baseline 05/22/14- 320 feet with walker with supervision   Time 2   Period Months   Status Achieved   PT LONG TERM GOAL #5   Title Gait Velocity >0.5 ft/sec (Target Date: 05/26/14)   Baseline 05/22/14- 0.5 ft/sec was achieved   Time 2   Period Months   Status Achieved   Additional Long Term Goals   Additional Long Term Goals Yes   PT LONG TERM GOAL #6   Title Patient ambulates 350' with rolling walker modified independent. (Target Date: 07/21/14)   Time 2   Period Months   Status New   PT LONG TERM GOAL #7   Title 6-minute Walk Test with rolling walker >180' (Target Date: 07/21/14)   Time 2   Period Months   Status New   PT LONG TERM GOAL #8   Title standing active knee extension right > -7, left > -13 degrees (Target Date: 07/21/14)   Time 2   Period Months   Status New               Plan - 06/08/14 1459    Clinical Impression Statement Pt ambulated at slightly increased gait speed compared to when the speed was tested 10 visits ago. In addition, he demonstrated ability to leg press increased resistance today. Hip adductor  spasticity was severe today. Pt isn't performing his new hamstring stretch at home yet. Recommended he do this. Continue per plan of care.   PT Next Visit Plan Increase leg press weight to 80# if able; ask if he has started the couch hamstring stretch  and if not, review it.           G-Codes - 07-01-14 1458    Functional Assessment Tool Used Gait velocity 0.46 ft/sec; ambulates >200' with RW and MOD I   Functional Limitation Mobility: Walking and moving around   Mobility: Walking and Moving Around Current Status [phone]) At least 40 percent but less than 60 percent impaired, limited or restricted   Mobility: Walking and Moving Around Goal Status [phone]) At least 40 percent but less than 60 percent impaired, limited or restricted      Problem List Patient Active Problem List   Diagnosis Date Noted  . Spastic tetraplegia 12/06/2013  . Hematuria 11/05/2013  . Contact dermatitis 05/12/2013  . Rash of hands 05/12/2013  . Fatigue 11/16/2012  . Anemia 11/16/2012  . History of recurrent UTIs 05/19/2010  . Functional quadriplegia 05/10/2009  . HEMATURIA 07/29/2007  . Closed fracture of cervical vertebra 07/29/2007   Delrae Sawyers, PT,DPT,NCS 01-Jul-2014 3:11 PM Phone [phone] FAX [phone]         [facility] [address] [place], [place], [address] Phone: [phone]   Fax:  [phone]

## 2014-06-09 ENCOUNTER — Ambulatory Visit: Payer: Commercial Managed Care - HMO | Attending: Physical Medicine & Rehabilitation

## 2014-06-09 DIAGNOSIS — G8250 Quadriplegia, unspecified: Secondary | ICD-10-CM

## 2014-06-09 DIAGNOSIS — IMO0001 Reserved for inherently not codable concepts without codable children: Secondary | ICD-10-CM

## 2014-06-09 DIAGNOSIS — R269 Unspecified abnormalities of gait and mobility: Secondary | ICD-10-CM | POA: Diagnosis not present

## 2014-06-09 DIAGNOSIS — M6249 Contracture of muscle, multiple sites: Secondary | ICD-10-CM | POA: Diagnosis not present

## 2014-06-09 DIAGNOSIS — R6889 Other general symptoms and signs: Secondary | ICD-10-CM | POA: Insufficient documentation

## 2014-06-09 DIAGNOSIS — M6289 Other specified disorders of muscle: Secondary | ICD-10-CM

## 2014-06-09 NOTE — Therapy (Signed)
[facility] [address] [place], [place], [address] Phone: [phone]   Fax:  [phone]  Physical Therapy Treatment  Patient Details  Name: Andrew Sullivan MRN: [id number] Date of Birth: [birth year] Referring Provider:  Timmothy Euler, MD  Encounter Date: 06/09/2014      PT End of Session - 06/09/14 1549    Visit Number 21   Number of Visits 33   Date for PT Re-Evaluation 07/21/14   Authorization Type [employer]; G codes required   PT Start Time 0160   PT Stop Time 1558   PT Time Calculation (min) 45 min      Past Medical History  Diagnosis Date  . Paraparesis of both lower limbs     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 44 in 70  . History of recurrent UTIs   . Erectile dysfunction   . Gross hematuria   . History of spinal cord injury     44 (age 44 fell from ladder) w/ cervical spine fx  s/p  c4 -- c6  fusion--  residual paraplegic bilateral legs (great below T8 uses w/c and can transfer self  . Spastic tetraplegia     Past Surgical History  Procedure Laterality Date  . Cervical fusion  1988    C4 -- C6 w/ spinal cord injury  . Hip arthroplasty  08/12/2011    Procedure: ARTHROPLASTY BIPOLAR HIP;  Surgeon: Mauri Pole, MD;  Location: [facility];  Service: Orthopedics;  Laterality: Left;  Hemi-arthroplasty  . Cystoscopy with retrograde pyelogram, ureteroscopy and stent placement Left 12/27/2013    Procedure: [place], URETEROSCOPY AND STENT PLACEMENT;  Surgeon: Reece Packer, MD;  Location: [facility];  Service: Urology;  Laterality: Left;  . Cystoscopy/retrograde/ureteroscopy Left 01/09/2014    Procedure: CYSTOSCOPY/RETROGRADE/URETEROSCOPY, URETERAL BIOPSY AND STENT EXCHANGE;  Surgeon: Alexis Frock, MD;  Location: [facility];  Service: Urology;  Laterality: Left;    There were no vitals filed for this visit.  Visit Diagnosis:  Abnormality  of gait  Quadriparesis (muscle weakness)  Muscle hypertonicity      Subjective Assessment - 06/09/14 1548    Symptoms No falls or complaints. Reports he was very tired after session yesterday.   Currently in Pain? No/denies     Leg press  60# 1x10, then 70# 3x10 (pt unable to press 80# yet)  Prolonged supine hamstring stretch BLE propped on padded box to simulate couch arm rest. x5 min 30 seconds.  Passive hip adductor stretch in hooklying 3x30 seconds  Supine long arc quads 2x10 each leg  Supine short arc quads 1x10 each leg with MOD A for final 5-10 degrees of extension.                             PT Short Term Goals - 05/25/14 2232    PT SHORT TERM GOAL #1   Title demonstrates updated HEP correctly (Target Date: 04/26/14)   Time 1   Period Months   Status Achieved   PT SHORT TERM GOAL #2   Title reaches 4" in standing with RW support with supervision (Target Date: 04/26/14)   Time 1   Period Months   Status Achieved   PT SHORT TERM GOAL #3   Title knee extension in standing with RW support -30 degrees bilaterally. (Target Date: 04/26/14)   Time 1   Period Months   Status Achieved  PT SHORT TERM GOAL #4   Title demonstrates understanding of updated HEP including exercises at fitness center (Target Date: 06/23/14)   Time 1   Period Months   Status New   PT SHORT TERM GOAL #5   Title Gait Velocity >0.55 ft/sec (Target Date: 06/23/14)   Time 1   Period Months   Status New   Additional Short Term Goals   Additional Short Term Goals Yes   PT SHORT TERM GOAL #6   Title reaches 6" with walker support without assist with supervision. (Target Date: 06/23/14)   Time 1   Period Months   Status New           PT Long Term Goals - 05/25/14 1613    PT LONG TERM GOAL #1   Title verbalizes ongoing progressive HEP including fitness program at fitness club. (NEW Target Date: 07/21/14)   Baseline partially met with HEP instructed to date 05/24/2014    Time 2   Period Months   Status On-going   PT LONG TERM GOAL #2   Title reports pain with gait /standing in hamstrings </= 2/10 (Target Date: 05/26/14)   Baseline on 05/24/14 reports pain 1/10 with activity   Time 2   Period Months   Status Achieved   PT LONG TERM GOAL #3   Title reaches 6" in standing with RW support safely. (NEW Target Date: 07/21/14)   Baseline only able to reach 4 inches safely with walker support- 05/24/14   Time 2   Period Months   Status On-going   PT LONG TERM GOAL #4   Title ambulates 300' with RW safely modified independent. (Target Date: 05/26/14)   Baseline 05/22/14- 320 feet with walker with supervision   Time 2   Period Months   Status Achieved   PT LONG TERM GOAL #5   Title Gait Velocity >0.5 ft/sec (Target Date: 05/26/14)   Baseline 05/22/14- 0.5 ft/sec was achieved   Time 2   Period Months   Status Achieved   Additional Long Term Goals   Additional Long Term Goals Yes   PT LONG TERM GOAL #6   Title Patient ambulates 350' with rolling walker modified independent. (Target Date: 07/21/14)   Time 2   Period Months   Status New   PT LONG TERM GOAL #7   Title 6-minute Walk Test with rolling walker >180' (Target Date: 07/21/14)   Time 2   Period Months   Status New   PT LONG TERM GOAL #8   Title standing active knee extension right > -7, left > -13 degrees (Target Date: 07/21/14)   Time 2   Period Months   Status New               Plan - 06/09/14 1549    Clinical Impression Statement Pt demonstrated improved ability to perform short and long arc quad today, demonstrating improved quad strength/improved ability to overcome hamstrings spasticity to straighten leg. COntinue per plan of care.   PT Next Visit Plan Standing and reaching/balance  and increase leg press to 80#          G-Codes - June 16, 2014 1458    Functional Assessment Tool Used Gait velocity 0.46 ft/sec; ambulates >200' with RW and MOD I   Functional Limitation Mobility:  Walking and moving around   Mobility: Walking and Moving Around Current Status ([id number]) At least 40 percent but less than 60 percent impaired, limited or restricted   Mobility: Walking and Moving Around  Goal Status [phone]) At least 40 percent but less than 60 percent impaired, limited or restricted      Problem List Patient Active Problem List   Diagnosis Date Noted  . Spastic tetraplegia 12/06/2013  . Hematuria 11/05/2013  . Contact dermatitis 05/12/2013  . Rash of hands 05/12/2013  . Fatigue 11/16/2012  . Anemia 11/16/2012  . History of recurrent UTIs 05/19/2010  . Functional quadriplegia 05/10/2009  . HEMATURIA 07/29/2007  . Closed fracture of cervical vertebra 07/29/2007    Delrae Sawyers, PT,DPT,NCS 06/09/2014 4:02 PM Phone [phone] FAX [phone]         [facility] [address] [place], [place], [address] Phone: [phone]   Fax:  [phone]

## 2014-06-14 ENCOUNTER — Ambulatory Visit: Payer: Commercial Managed Care - HMO

## 2014-06-14 DIAGNOSIS — R6889 Other general symptoms and signs: Secondary | ICD-10-CM | POA: Diagnosis not present

## 2014-06-14 DIAGNOSIS — R269 Unspecified abnormalities of gait and mobility: Secondary | ICD-10-CM

## 2014-06-14 DIAGNOSIS — M6249 Contracture of muscle, multiple sites: Secondary | ICD-10-CM | POA: Diagnosis not present

## 2014-06-14 DIAGNOSIS — M6289 Other specified disorders of muscle: Secondary | ICD-10-CM

## 2014-06-14 DIAGNOSIS — G8250 Quadriplegia, unspecified: Secondary | ICD-10-CM

## 2014-06-14 DIAGNOSIS — IMO0001 Reserved for inherently not codable concepts without codable children: Secondary | ICD-10-CM

## 2014-06-14 NOTE — Therapy (Signed)
[facility] [address] [place], [place], [address] Phone: [phone]   Fax:  [phone]  Physical Therapy Treatment  Patient Details  Name: Andrew Sullivan MRN: [id number] Date of Birth: 1971-01-14 Referring Provider:  Timmothy Euler, MD  Encounter Date: 06/14/2014      PT End of Session - 06/14/14 1650    Visit Number 22   Number of Visits 33   Date for PT Re-Evaluation 07/21/14   Authorization Type [employer]; G codes required   PT Start Time 9449   PT Stop Time 1531   PT Time Calculation (min) 42 min   Equipment Utilized During Treatment Gait belt      Past Medical History  Diagnosis Date  . Paraparesis of both lower limbs     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 62 in 59  . History of recurrent UTIs   . Erectile dysfunction   . Gross hematuria   . History of spinal cord injury     49 (age 39 fell from ladder) w/ cervical spine fx  s/p  c4 -- c6  fusion--  residual paraplegic bilateral legs (great below T8 uses w/c and can transfer self  . Spastic tetraplegia     Past Surgical History  Procedure Laterality Date  . Cervical fusion  1988    C4 -- C6 w/ spinal cord injury  . Hip arthroplasty  08/12/2011    Procedure: ARTHROPLASTY BIPOLAR HIP;  Surgeon: Mauri Pole, MD;  Location: [facility];  Service: Orthopedics;  Laterality: Left;  Hemi-arthroplasty  . Cystoscopy with retrograde pyelogram, ureteroscopy and stent placement Left 12/27/2013    Procedure: [place], URETEROSCOPY AND STENT PLACEMENT;  Surgeon: Reece Packer, MD;  Location: [facility];  Service: Urology;  Laterality: Left;  . Cystoscopy/retrograde/ureteroscopy Left 01/09/2014    Procedure: CYSTOSCOPY/RETROGRADE/URETEROSCOPY, URETERAL BIOPSY AND STENT EXCHANGE;  Surgeon: Alexis Frock, MD;  Location[facility];  Service: Urology;  Laterality: Left;    There were no vitals filed  for this visit.  Visit Diagnosis:  Abnormality of gait  Quadriparesis (muscle weakness)  Muscle hypertonicity      Subjective Assessment - 06/14/14 1453    Subjective No falls or complaints.   Currently in Pain? Yes   Pain Score 2    Pain Location Leg   Pain Orientation Right;Left;Posterior   Aggravating Factors  standing     Therex: Supine bilateral hamstring stretch with BLE propped on box, and with therapist providing passive stretch 3x30 seconds each leg with extra time for the prolonged stretch on each leg   Gait training 2x75' with rollator with MOD I.  Lateral walking  2 laps to the left and right at countertop with supervision and rest after each span of the countertop  Neuro re-ed: -Reaching with alternate UE while standing at counter with single UE support -Trialed standing without UE support with MIN A to steady at countertop- -Multiple trials standing without UE suuport in parallel bars with MIN A to steady with longest trial 18 seconds. Seated rest between trials.                        PT Short Term Goals - 05/25/14 2232    PT SHORT TERM GOAL #1   Title demonstrates updated HEP correctly (Target Date: 04/26/14)   Time 1   Period Months   Status Achieved   PT SHORT TERM GOAL #2  Title reaches 4" in standing with RW support with supervision (Target Date: 04/26/14)   Time 1   Period Months   Status Achieved   PT SHORT TERM GOAL #3   Title knee extension in standing with RW support -30 degrees bilaterally. (Target Date: 04/26/14)   Time 1   Period Months   Status Achieved   PT SHORT TERM GOAL #4   Title demonstrates understanding of updated HEP including exercises at fitness center (Target Date: 06/23/14)   Time 1   Period Months   Status New   PT SHORT TERM GOAL #5   Title Gait Velocity >0.55 ft/sec (Target Date: 06/23/14)   Time 1   Period Months   Status New   Additional Short Term Goals   Additional Short Term Goals Yes   PT  SHORT TERM GOAL #6   Title reaches 6" with walker support without assist with supervision. (Target Date: 06/23/14)   Time 1   Period Months   Status New           PT Long Term Goals - 05/25/14 1613    PT LONG TERM GOAL #1   Title verbalizes ongoing progressive HEP including fitness program at fitness club. (NEW Target Date: 07/21/14)   Baseline partially met with HEP instructed to date 05/24/2014   Time 2   Period Months   Status On-going   PT LONG TERM GOAL #2   Title reports pain with gait /standing in hamstrings </= 2/10 (Target Date: 05/26/14)   Baseline on 05/24/14 reports pain 1/10 with activity   Time 2   Period Months   Status Achieved   PT LONG TERM GOAL #3   Title reaches 6" in standing with RW support safely. (NEW Target Date: 07/21/14)   Baseline only able to reach 4 inches safely with walker support- 05/24/14   Time 2   Period Months   Status On-going   PT LONG TERM GOAL #4   Title ambulates 300' with RW safely modified independent. (Target Date: 05/26/14)   Baseline 05/22/14- 320 feet with walker with supervision   Time 2   Period Months   Status Achieved   PT LONG TERM GOAL #5   Title Gait Velocity >0.5 ft/sec (Target Date: 05/26/14)   Baseline 05/22/14- 0.5 ft/sec was achieved   Time 2   Period Months   Status Achieved   Additional Long Term Goals   Additional Long Term Goals Yes   PT LONG TERM GOAL #6   Title Patient ambulates 350' with rolling walker modified independent. (Target Date: 07/21/14)   Time 2   Period Months   Status New   PT LONG TERM GOAL #7   Title 6-minute Walk Test with rolling walker >180' (Target Date: 07/21/14)   Time 2   Period Months   Status New   PT LONG TERM GOAL #8   Title standing active knee extension right > -7, left > -13 degrees (Target Date: 07/21/14)   Time 2   Period Months   Status New               Plan - 06/14/14 1651    Clinical Impression Statement Pt demonstrated ability to stand 18 seconds without UE  support with CGA/MIN A today. Continuing to make progress. Continue per plan of care.    PT Next Visit Plan Standing and reaching/balance  and increase leg press to 80#        Problem List Patient Active Problem List  Diagnosis Date Noted  . Spastic tetraplegia 12/06/2013  . Hematuria 11/05/2013  . Contact dermatitis 05/12/2013  . Rash of hands 05/12/2013  . Fatigue 11/16/2012  . Anemia 11/16/2012  . History of recurrent UTIs 05/19/2010  . Functional quadriplegia 05/10/2009  . HEMATURIA 07/29/2007  . Closed fracture of cervical vertebra 07/29/2007   Delrae Sawyers, PT,DPT,NCS 06/14/2014 4:53 PM Phone [phone] FAX [phone]         [facility] [address] [place], [place], [address] Phone: [phone]   Fax:  [phone]

## 2014-06-16 ENCOUNTER — Encounter: Payer: Self-pay | Admitting: Physical Therapy

## 2014-06-16 ENCOUNTER — Ambulatory Visit: Payer: Commercial Managed Care - HMO | Admitting: Physical Therapy

## 2014-06-16 DIAGNOSIS — R6889 Other general symptoms and signs: Secondary | ICD-10-CM

## 2014-06-16 DIAGNOSIS — R269 Unspecified abnormalities of gait and mobility: Secondary | ICD-10-CM | POA: Diagnosis not present

## 2014-06-16 DIAGNOSIS — M6249 Contracture of muscle, multiple sites: Secondary | ICD-10-CM | POA: Diagnosis not present

## 2014-06-16 DIAGNOSIS — M6289 Other specified disorders of muscle: Secondary | ICD-10-CM

## 2014-06-16 DIAGNOSIS — G8250 Quadriplegia, unspecified: Secondary | ICD-10-CM | POA: Diagnosis not present

## 2014-06-16 NOTE — Therapy (Signed)
[facility] [address] [place], [place], [address] Phone: [phone]   Fax:  [phone]  Physical Therapy Treatment  Patient Details  Name: Andrew Sullivan MRN: [id number] Date of Birth: 1971-03-01 Referring Provider:  Timmothy Euler, MD  Encounter Date: 06/16/2014     06/16/14 1322  PT Visits / Re-Eval  Visit Number 23  Number of Visits 33  Date for PT Re-Evaluation 07/21/14  Authorization  Authorization Type [employer]; G codes required  PT Time Calculation  PT Start Time 3888  PT Stop Time 1400  PT Time Calculation (min) 43 min  PT - End of Session  Equipment Utilized During Treatment Gait belt    Past Medical History  Diagnosis Date  . Paraparesis of both lower limbs     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 44 in 56  . History of recurrent UTIs   . Erectile dysfunction   . Gross hematuria   . History of spinal cord injury     44 (age 67 fell from ladder) w/ cervical spine fx  s/p  c4 -- c6  fusion--  residual paraplegic bilateral legs (great below T8 uses w/c and can transfer self  . Spastic tetraplegia     Past Surgical History  Procedure Laterality Date  . Cervical fusion  1988    C4 -- C6 w/ spinal cord injury  . Hip arthroplasty  08/12/2011    Procedure: ARTHROPLASTY BIPOLAR HIP;  Surgeon: Mauri Pole, MD;  Location: [facility];  Service: Orthopedics;  Laterality: Left;  Hemi-arthroplasty  . Cystoscopy with retrograde pyelogram, ureteroscopy and stent placement Left 12/27/2013    Procedure: [place], URETEROSCOPY AND STENT PLACEMENT;  Surgeon: Reece Packer, MD;  Location: [facility];  Service: Urology;  Laterality: Left;  . Cystoscopy/retrograde/ureteroscopy Left 01/09/2014    Procedure: CYSTOSCOPY/RETROGRADE/URETEROSCOPY, URETERAL BIOPSY AND STENT EXCHANGE;  Surgeon: Alexis Frock, MD;  Location: [facility];  Service: Urology;   Laterality: Left;    There were no vitals filed for this visit.  Visit Diagnosis:  Abnormality of gait  Muscle hypertonicity  Activity intolerance  Treatment: Passive prolonged stretching to bil hamstrings and hip adductors with pt seated at edge of mat and leg on therapist legs while seated on rolling stool to allow for increased ranges.  Standing in parallel bars with strap used to promote knee extension/prevent buckling - no UE support. trialed multiple times with pt able to maintain balance 33 sec's x 1 and 38 sec's x 1 as max unsupported standing times. - no to 1 UE support with head turns and head nods x 10 each  Legpress - 70# x 10 reps both legs - 75# x 10 reps both legs - 80# x 10 reps both legs.        PT Short Term Goals - 05/25/14 2232    PT SHORT TERM GOAL #1   Title demonstrates updated HEP correctly (Target Date: 04/26/14)   Time 1   Period Months   Status Achieved   PT SHORT TERM GOAL #2   Title reaches 4" in standing with RW support with supervision (Target Date: 04/26/14)   Time 1   Period Months   Status Achieved   PT SHORT TERM GOAL #3   Title knee extension in standing with RW support -30 degrees bilaterally. (Target Date: 04/26/14)   Time 1   Period Months   Status Achieved   PT SHORT TERM GOAL #4  Title demonstrates understanding of updated HEP including exercises at fitness center (Target Date: 06/23/14)   Time 1   Period Months   Status New   PT SHORT TERM GOAL #5   Title Gait Velocity >0.55 ft/sec (Target Date: 06/23/14)   Time 1   Period Months   Status New   Additional Short Term Goals   Additional Short Term Goals Yes   PT SHORT TERM GOAL #6   Title reaches 6" with walker support without assist with supervision. (Target Date: 06/23/14)   Time 1   Period Months   Status New           PT Long Term Goals - 05/25/14 1613    PT LONG TERM GOAL #1   Title verbalizes ongoing progressive HEP including fitness program at fitness club.  (NEW Target Date: 07/21/14)   Baseline partially met with HEP instructed to date 05/24/2014   Time 2   Period Months   Status On-going   PT LONG TERM GOAL #2   Title reports pain with gait /standing in hamstrings </= 2/10 (Target Date: 05/26/14)   Baseline on 05/24/14 reports pain 1/10 with activity   Time 2   Period Months   Status Achieved   PT LONG TERM GOAL #3   Title reaches 6" in standing with RW support safely. (NEW Target Date: 07/21/14)   Baseline only able to reach 4 inches safely with walker support- 05/24/14   Time 2   Period Months   Status On-going   PT LONG TERM GOAL #4   Title ambulates 300' with RW safely modified independent. (Target Date: 05/26/14)   Baseline 05/22/14- 320 feet with walker with supervision   Time 2   Period Months   Status Achieved   PT LONG TERM GOAL #5   Title Gait Velocity >0.5 ft/sec (Target Date: 05/26/14)   Baseline 05/22/14- 0.5 ft/sec was achieved   Time 2   Period Months   Status Achieved   Additional Long Term Goals   Additional Long Term Goals Yes   PT LONG TERM GOAL #6   Title Patient ambulates 350' with rolling walker modified independent. (Target Date: 07/21/14)   Time 2   Period Months   Status New   PT LONG TERM GOAL #7   Title 6-minute Walk Test with rolling walker >180' (Target Date: 07/21/14)   Time 2   Period Months   Status New   PT LONG TERM GOAL #8   Title standing active knee extension right > -7, left > -13 degrees (Target Date: 07/21/14)   Time 2   Period Months   Status New        06/16/14 1323  Plan  Clinical Impression Statement Pt able to increase weight on legpress today without issues. Progressing toward goals.  Pt will benefit from skilled therapeutic intervention in order to improve on the following deficits Abnormal gait;Decreased activity tolerance;Decreased balance;Decreased mobility;Decreased knowledge of use of DME;Decreased range of motion;Decreased strength;Impaired tone  Rehab Potential Good   Clinical Impairments Affecting Rehab Potential time since injury  PT Frequency 2x / week  PT Duration 8 weeks (9 weeks (2 months))  PT Treatment/Interventions ADLs/Self Care Home Management;Gait training;DME Instruction;Stair training;Functional mobility training;Therapeutic activities;Therapeutic exercise;Balance training;Neuromuscular re-education;Patient/family education;Manual techniques  PT Next Visit Plan Assess LTG's.  Consulted and Agree with Plan of Care Patient    Problem List Patient Active Problem List   Diagnosis Date Noted  . Spastic tetraplegia 12/06/2013  . Hematuria 11/05/2013  .  Contact dermatitis 05/12/2013  . Rash of hands 05/12/2013  . Fatigue 11/16/2012  . Anemia 11/16/2012  . History of recurrent UTIs 05/19/2010  . Functional quadriplegia 05/10/2009  . HEMATURIA 07/29/2007  . Closed fracture of cervical vertebra 07/29/2007    Willow Ora 06/19/2014, 9:31 AM  Willow Ora, PTA, [facility] [address], [place], [place] [address] [phone] 06/19/2014, 9:36 AM

## 2014-06-21 ENCOUNTER — Ambulatory Visit: Payer: Commercial Managed Care - HMO | Admitting: Physical Therapy

## 2014-06-21 ENCOUNTER — Encounter: Payer: Self-pay | Admitting: Physical Therapy

## 2014-06-21 DIAGNOSIS — IMO0001 Reserved for inherently not codable concepts without codable children: Secondary | ICD-10-CM

## 2014-06-21 DIAGNOSIS — R6889 Other general symptoms and signs: Secondary | ICD-10-CM | POA: Diagnosis not present

## 2014-06-21 DIAGNOSIS — M6249 Contracture of muscle, multiple sites: Secondary | ICD-10-CM | POA: Diagnosis not present

## 2014-06-21 DIAGNOSIS — R269 Unspecified abnormalities of gait and mobility: Secondary | ICD-10-CM

## 2014-06-21 DIAGNOSIS — G8250 Quadriplegia, unspecified: Secondary | ICD-10-CM | POA: Diagnosis not present

## 2014-06-21 DIAGNOSIS — M6289 Other specified disorders of muscle: Secondary | ICD-10-CM

## 2014-06-21 NOTE — Therapy (Signed)
[facility] [address] [place], [place], [address] Phone: [phone]   Fax:  [phone]  Physical Therapy Treatment  Patient Details  Name: Andrew Sullivan MRN: [id number] Date of Birth: 02/24/1971 Referring Provider:  Timmothy Euler, MD  Encounter Date: 06/21/2014      PT End of Session - 06/21/14 1410    Visit Number 24   Number of Visits 33   Date for PT Re-Evaluation 07/21/14   Authorization Type [employer]; G codes required   PT Start Time 1403   PT Stop Time 1444   PT Time Calculation (min) 41 min   Equipment Utilized During Treatment Gait belt   Activity Tolerance Patient tolerated treatment well   Behavior During Therapy [facility] for tasks assessed/performed      Past Medical History  Diagnosis Date  . Paraparesis of both lower limbs     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 43 in 18  . History of recurrent UTIs   . Erectile dysfunction   . Gross hematuria   . History of spinal cord injury     63 (age 78 fell from ladder) w/ cervical spine fx  s/p  c4 -- c6  fusion--  residual paraplegic bilateral legs (great below T8 uses w/c and can transfer self  . Spastic tetraplegia     Past Surgical History  Procedure Laterality Date  . Cervical fusion  1988    C4 -- C6 w/ spinal cord injury  . Hip arthroplasty  08/12/2011    Procedure: ARTHROPLASTY BIPOLAR HIP;  Surgeon: Mauri Pole, MD;  Location: [facility];  Service: Orthopedics;  Laterality: Left;  Hemi-arthroplasty  . Cystoscopy with retrograde pyelogram, ureteroscopy and stent placement Left 12/27/2013    Procedure: [place], URETEROSCOPY AND STENT PLACEMENT;  Surgeon: Reece Packer, MD;  Location: [facility];  Service: Urology;  Laterality: Left;  . Cystoscopy/retrograde/ureteroscopy Left 01/09/2014    Procedure: CYSTOSCOPY/RETROGRADE/URETEROSCOPY, URETERAL BIOPSY AND STENT EXCHANGE;   Surgeon: Alexis Frock, MD;  Location[facility];  Service: Urology;  Laterality: Left;    There were no vitals filed for this visit.  Visit Diagnosis:  Abnormality of gait  Muscle hypertonicity  Quadriparesis (muscle weakness)  Activity intolerance      Subjective Assessment - 06/21/14 1408    Subjective No falls or new complaints.   Currently in Pain? Yes   Pain Score 2    Pain Location Leg  hamstrings   Pain Orientation Right;Left;Posterior   Pain Descriptors / Indicators Sore;Tightness   Pain Type Chronic pain   Pain Onset More than a month ago   Pain Frequency Intermittent   Aggravating Factors  standing   Pain Relieving Factors stretching           [facility] Adult PT Treatment/Exercise - 06/21/14 1413    Ambulation/Gait   Ambulation/Gait Yes   Ambulation/Gait Assistance 5: Supervision   Ambulation/Gait Assistance Details cues on posture and for knee extension in stance with "push off" to toe off for swing phase/advancement of each leg                             Ambulation Distance (Feet) 100 Feet  x 1, 150 x1   Assistive device Rolling walker   Gait Pattern Step-through pattern;Decreased stride length;Decreased step length - right;Decreased step length - left;Poor foot clearance - left;Poor foot clearance - right;Right flexed knee in stance;Left  flexed knee in stance   Ambulation Surface Level;Indoor     Passive prolonged stretching to bil hamstrings/hip adductors seated at edge of mat.   Leg press: both legs with ball squeeze between knees 60# 1 sets of 10 reps 70# 2 sets of 10 reps 75# 2 sets of 10 reps 80# pt unable with assist, would be passive if performed for full range.        PT Short Term Goals - 05/25/14 2232    PT SHORT TERM GOAL #1   Title demonstrates updated HEP correctly (Target Date: 04/26/14)   Time 1   Period Months   Status Achieved   PT SHORT TERM GOAL #2   Title reaches 4" in standing with RW support with supervision (Target Date:  04/26/14)   Time 1   Period Months   Status Achieved   PT SHORT TERM GOAL #3   Title knee extension in standing with RW support -30 degrees bilaterally. (Target Date: 04/26/14)   Time 1   Period Months   Status Achieved   PT SHORT TERM GOAL #4   Title demonstrates understanding of updated HEP including exercises at fitness center (Target Date: 06/23/14)   Time 1   Period Months   Status New   PT SHORT TERM GOAL #5   Title Gait Velocity >0.55 ft/sec (Target Date: 06/23/14)   Time 1   Period Months   Status New   Additional Short Term Goals   Additional Short Term Goals Yes   PT SHORT TERM GOAL #6   Title reaches 6" with walker support without assist with supervision. (Target Date: 06/23/14)   Time 1   Period Months   Status New           PT Long Term Goals - 06/21/14 1411    PT LONG TERM GOAL #1   Title verbalizes ongoing progressive HEP including fitness program at fitness club. (NEW Target Date: 07/21/14)   Baseline partially met with HEP instructed to date 05/24/2014   Time 2   Period Months   Status On-going   PT LONG TERM GOAL #2   Title reports pain with gait /standing in hamstrings </= 2/10 (Target Date: 05/26/14)   Baseline on 05/24/14 reports pain 1/10 with activity   Time 2   Period Months   Status Achieved   PT LONG TERM GOAL #3   Title reaches 6" in standing with RW support safely. (NEW Target Date: 07/21/14)   Baseline only able to reach 4 inches safely with walker support- 05/24/14   Time 2   Period Months   Status On-going   PT LONG TERM GOAL #4   Title ambulates 300' with RW safely modified independent. (Target Date: 05/26/14)   Baseline 05/22/14- 320 feet with walker with supervision   Time 2   Period Months   Status Achieved   PT LONG TERM GOAL #5   Title Gait Velocity >0.5 ft/sec (Target Date: 05/26/14)   Baseline 05/22/14- 0.5 ft/sec was achieved   Time 2   Period Months   Status Achieved   PT LONG TERM GOAL #6   Title Patient ambulates 350' with  rolling walker modified independent. (Target Date: 07/21/14)   Time 2   Period Months   Status New   PT LONG TERM GOAL #7   Title 6-minute Walk Test with rolling walker >180' (Target Date: 07/21/14)   Time 2   Period Months   Status New   PT LONG TERM GOAL #  8   Title standing active knee extension right > -7, left > -13 degrees (Target Date: 07/21/14)   Time 2   Period Months   Status New           Plan - 06/21/14 1410    Clinical Impression Statement Plan of care discussed between pt/PT and this therapist. Will assess goals next session and then place pt on hold until 4 weeks after pt get his next round of botox on 06/26/14. At that time will resume PT for more stretching and strengtheing. All in agreement.                                                                    Pt will benefit from skilled therapeutic intervention in order to improve on the following deficits Abnormal gait;Decreased activity tolerance;Decreased balance;Decreased mobility;Decreased knowledge of use of DME;Decreased range of motion;Decreased strength;Impaired tone   Rehab Potential Good   Clinical Impairments Affecting Rehab Potential time since injury   PT Frequency 2x / week   PT Duration 8 weeks  9 weeks (2 months)   PT Treatment/Interventions ADLs/Self Care Home Management;Gait training;DME Instruction;Stair training;Functional mobility training;Therapeutic activities;Therapeutic exercise;Balance training;Neuromuscular re-education;Patient/family education;Manual techniques   PT Next Visit Plan Assess LTG's, pt on hold after next session until 4 week after botox   Consulted and Agree with Plan of Care Patient      Problem List Patient Active Problem List   Diagnosis Date Noted  . Spastic tetraplegia 12/06/2013  . Hematuria 11/05/2013  . Contact dermatitis 05/12/2013  . Rash of hands 05/12/2013  . Fatigue 11/16/2012  . Anemia 11/16/2012  . History of recurrent UTIs 05/19/2010  . Functional  quadriplegia 05/10/2009  . HEMATURIA 07/29/2007  . Closed fracture of cervical vertebra 07/29/2007    Willow Ora 06/21/2014, 9:35 PM  Willow Ora, PTA, [facility] [address] [place], [place] [address] [phone] 06/21/2014, 9:35 PM

## 2014-06-23 ENCOUNTER — Ambulatory Visit: Payer: Commercial Managed Care - HMO

## 2014-06-23 DIAGNOSIS — G8250 Quadriplegia, unspecified: Secondary | ICD-10-CM

## 2014-06-23 DIAGNOSIS — R269 Unspecified abnormalities of gait and mobility: Secondary | ICD-10-CM | POA: Diagnosis not present

## 2014-06-23 DIAGNOSIS — R6889 Other general symptoms and signs: Secondary | ICD-10-CM

## 2014-06-23 DIAGNOSIS — M6289 Other specified disorders of muscle: Secondary | ICD-10-CM

## 2014-06-23 DIAGNOSIS — IMO0001 Reserved for inherently not codable concepts without codable children: Secondary | ICD-10-CM

## 2014-06-23 DIAGNOSIS — M6249 Contracture of muscle, multiple sites: Secondary | ICD-10-CM | POA: Diagnosis not present

## 2014-06-23 NOTE — Therapy (Signed)
[facility] [address] [place], [place], [address] Phone: [phone]   Fax:  [phone]  Physical Therapy Treatment  Patient Details  Name: Andrew Sullivan MRN: [id number] Date of Birth: 04-23-1970 Referring Provider:  Timmothy Euler, MD  Encounter Date: 06/23/2014      PT End of Session - 06/23/14 1429    Visit Number 25   Number of Visits 33   Date for PT Re-Evaluation 07/21/14   Authorization Type [employer]; G codes required   PT Start Time 1320   PT Stop Time 1432   PT Time Calculation (min) 72 min   Equipment Utilized During Treatment Gait belt   Activity Tolerance Patient tolerated treatment well      Past Medical History  Diagnosis Date  . Paraparesis of both lower limbs     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 71 in 28  . History of recurrent UTIs   . Erectile dysfunction   . Gross hematuria   . History of spinal cord injury     37 (age 17 fell from ladder) w/ cervical spine fx  s/p  c4 -- c6  fusion--  residual paraplegic bilateral legs (great below T8 uses w/c and can transfer self  . Spastic tetraplegia     Past Surgical History  Procedure Laterality Date  . Cervical fusion  1988    C4 -- C6 w/ spinal cord injury  . Hip arthroplasty  08/12/2011    Procedure: ARTHROPLASTY BIPOLAR HIP;  Surgeon: Mauri Pole, MD;  Location: [facility];  Service: Orthopedics;  Laterality: Left;  Hemi-arthroplasty  . Cystoscopy with retrograde pyelogram, ureteroscopy and stent placement Left 12/27/2013    Procedure: [place], URETEROSCOPY AND STENT PLACEMENT;  Surgeon: Reece Packer, MD;  Location: [facility];  Service: Urology;  Laterality: Left;  . Cystoscopy/retrograde/ureteroscopy Left 01/09/2014    Procedure: CYSTOSCOPY/RETROGRADE/URETEROSCOPY, URETERAL BIOPSY AND STENT EXCHANGE;  Surgeon: Alexis Frock, MD;  [facility];  Service:  Urology;  Laterality: Left;    There were no vitals filed for this visit.  Visit Diagnosis:  Abnormality of gait  Muscle hypertonicity  Quadriparesis (muscle weakness)  Activity intolerance      Subjective Assessment - 06/23/14 1344    Subjective No falls or new complaints.   Currently in Pain? No/denies       Gait training x500' in 23 minutes with noted increased step length, increased activity tolerance less pain in hamstrings than usual  Neuro re-ed: Reaching task with standing with single UE support on RW-forward reaching for cones with weight shifting to place cones at right and left sides (no crossing over midline as this would be too challenging), MIN A to steady -Standing with lateral weight shifting with single UE support on RW and other UE sliding a ball left and right on a table at shoulder level -standing with single UE support on RW and other UE dribbling red physioball, performed on each side, 2 sets to form fatigue with MIN A to steady.    Therex: (15 minutes) Stretching of quads and hip flexors passive in sidelying, then supine on edge of mat with gravity+therapist overpressure -stretching of hip adductors in supine, passive, provided by therapist, then with green physioball to maintain stretch for 5 minutes                     PT Education - 06/23/14 1428    Education provided Yes  Education Details re-iterated plan to hold until 4 weeks post botox, and pt return to therapy afterwards.   Person(s) Educated Patient   Methods Explanation   Comprehension Verbalized understanding          PT Short Term Goals - 05/25/14 2232    PT SHORT TERM GOAL #1   Title demonstrates updated HEP correctly (Target Date: 04/26/14)   Time 1   Period Months   Status Achieved   PT SHORT TERM GOAL #2   Title reaches 4" in standing with RW support with supervision (Target Date: 04/26/14)   Time 1   Period Months   Status Achieved   PT SHORT TERM GOAL #3    Title knee extension in standing with RW support -30 degrees bilaterally. (Target Date: 04/26/14)   Time 1   Period Months   Status Achieved   PT SHORT TERM GOAL #4   Title demonstrates understanding of updated HEP including exercises at fitness center (Target Date: 06/23/14)   Time 1   Period Months   Status New   PT SHORT TERM GOAL #5   Title Gait Velocity >0.55 ft/sec (Target Date: 06/23/14)   Time 1   Period Months   Status New   Additional Short Term Goals   Additional Short Term Goals Yes   PT SHORT TERM GOAL #6   Title reaches 6" with walker support without assist with supervision. (Target Date: 06/23/14)   Time 1   Period Months   Status New           PT Long Term Goals - 06/23/14 1442    PT LONG TERM GOAL #1   Title verbalizes ongoing progressive HEP including fitness program at fitness club. (NEW Target Date: 07/21/14)   Baseline partially met with HEP instructed to date 05/24/2014   Time 2   Period Months   Status Partially Met   PT LONG TERM GOAL #2   Title reports pain with gait /standing in hamstrings </= 2/10 (Target Date: 05/26/14)   Baseline on 05/24/14 reports pain 1/10 with activity   Time 2   Period Months   Status Achieved   PT LONG TERM GOAL #3   Title reaches 6" in standing with RW support safely. (NEW Target Date: 07/21/14)   Baseline only able to reach 4 inches safely with walker support- 05/24/14   Time 2   Period Months   Status Achieved   PT LONG TERM GOAL #4   Title ambulates 300' with RW safely modified independent. (Target Date: 05/26/14)   Baseline 05/22/14- 320 feet with walker with supervision   Time 2   Period Months   Status Achieved   PT LONG TERM GOAL #5   Title Gait Velocity >0.5 ft/sec (Target Date: 05/26/14)   Baseline 05/22/14- 0.5 ft/sec was achieved   Time 2   Period Months   Status Achieved   PT LONG TERM GOAL #6   Title Patient ambulates 350' with rolling walker modified independent. (Target Date: 07/21/14)   Time 2    Period Months   Status Achieved   PT LONG TERM GOAL #7   Title 6-minute Walk Test with rolling walker >180' (Target Date: 07/21/14)   Time 2   Period Months   Status New   PT LONG TERM GOAL #8   Title standing active knee extension right > -7, left > -13 degrees (Target Date: 07/21/14)   Time 2   Period Months   Status New  Plan - 06/23/14 1430    Clinical Impression Statement Pt met gait distance goal. Able to ambulate 500' with RW and MOD I today. He will be put on hold until 4 weeks after botox to maximize therapy visits and functional gains.   PT Next Visit Plan Assess LTGs and update/renew        Problem List Patient Active Problem List   Diagnosis Date Noted  . Spastic tetraplegia 12/06/2013  . Hematuria 11/05/2013  . Contact dermatitis 05/12/2013  . Rash of hands 05/12/2013  . Fatigue 11/16/2012  . Anemia 11/16/2012  . History of recurrent UTIs 05/19/2010  . Functional quadriplegia 05/10/2009  . HEMATURIA 07/29/2007  . Closed fracture of cervical vertebra 07/29/2007   Delrae Sawyers, PT,DPT,NCS 06/23/2014 2:45 PM Phone [phone] FAX [phone]         [facility] [address] [place], [place], [address] Phone: [phone]   Fax:  [phone]

## 2014-06-26 ENCOUNTER — Ambulatory Visit: Payer: Commercial Managed Care - HMO | Admitting: Physical Medicine & Rehabilitation

## 2014-06-30 ENCOUNTER — Ambulatory Visit (INDEPENDENT_AMBULATORY_CARE_PROVIDER_SITE_OTHER): Payer: Commercial Managed Care - HMO | Admitting: Family Medicine

## 2014-06-30 ENCOUNTER — Encounter: Payer: Self-pay | Admitting: Family Medicine

## 2014-06-30 VITALS — BP 114/70 | HR 75 | Temp 97.8°F

## 2014-06-30 DIAGNOSIS — Z136 Encounter for screening for cardiovascular disorders: Secondary | ICD-10-CM | POA: Diagnosis not present

## 2014-06-30 DIAGNOSIS — Z0000 Encounter for general adult medical examination without abnormal findings: Secondary | ICD-10-CM | POA: Diagnosis not present

## 2014-06-30 DIAGNOSIS — R532 Functional quadriplegia: Secondary | ICD-10-CM | POA: Diagnosis not present

## 2014-06-30 DIAGNOSIS — D649 Anemia, unspecified: Secondary | ICD-10-CM

## 2014-06-30 LAB — CBC WITH DIFFERENTIAL/PLATELET
BASOS ABS: 0 10*3/uL (ref 0.0–0.1)
BASOS PCT: 0 % (ref 0–1)
Eosinophils Absolute: 0.2 10*3/uL (ref 0.0–0.7)
Eosinophils Relative: 5 % (ref 0–5)
HCT: 39.9 % (ref 39.0–52.0)
Hemoglobin: 13.2 g/dL (ref 13.0–17.0)
Lymphocytes Relative: 33 % (ref 12–46)
Lymphs Abs: 1.2 10*3/uL (ref 0.7–4.0)
MCH: 28.9 pg (ref 26.0–34.0)
MCHC: 33.1 g/dL (ref 30.0–36.0)
MCV: 87.3 fL (ref 78.0–100.0)
MONOS PCT: 11 % (ref 3–12)
MPV: 10.1 fL (ref 8.6–12.4)
Monocytes Absolute: 0.4 10*3/uL (ref 0.1–1.0)
NEUTROS PCT: 51 % (ref 43–77)
Neutro Abs: 1.9 10*3/uL (ref 1.7–7.7)
Platelets: 190 10*3/uL (ref 150–400)
RBC: 4.57 MIL/uL (ref 4.22–5.81)
RDW: 13.6 % (ref 11.5–15.5)
WBC: 3.7 10*3/uL — ABNORMAL LOW (ref 4.0–10.5)

## 2014-06-30 LAB — LIPID PANEL
CHOL/HDL RATIO: 2.8 ratio
CHOLESTEROL: 184 mg/dL (ref 0–200)
HDL: 65 mg/dL (ref >=40)
LDL Cholesterol: 97 mg/dL (ref 0–99)
Triglycerides: 109 mg/dL (ref <150)
VLDL: 22 mg/dL (ref 0–40)

## 2014-06-30 MED ORDER — BACLOFEN 20 MG PO TABS: 20.0000 mg | ORAL_TABLET | Freq: Four times a day (QID) | ORAL | Status: DC

## 2014-06-30 NOTE — Patient Instructions (Signed)
Great to see you!  Lets follow up in 6 months, I will send a letter about your labs unless there is something to do about them quickly.   Please don't hesitate to call if you need anything.

## 2014-06-30 NOTE — Progress Notes (Signed)
Patient ID: Andrew Sullivan, male   DOB: [birth year], 44 y.o.   MRN: [id number]   HPI  Patient presents today for f/u  Functional quadriplegia Doing well, no s/op L hip arthroplasty and off of tramadol Baclofen working well, needs refill PT going well No complaints  Smoking status noted  ROS:  Denies:  Chest pain Dyspnea Fever, chills, sweats dysuria  Objective: BP 114/70 mmHg  Pulse 75  Temp(Src) 97.8 F (36.6 C) (Oral) Gen: NAD, alert, cooperative with exam HEENT: NCAT, MMM CV: RRR, good S1/S2, no murmur Resp: CTABL, no wheezes, non-labored Ext: No edema, warm Neuro: Alert and oriented, normal speech, easily transfers from car to wheel chair  Assessment and plan:  Functional quadriplegia Doing well with PT, no complaints Refill baclofen Now off of tramadol which was for hip pain, s/p L hip replacment Doing well with PM&R   Anemia Resolved on last CBC in oct 2015 but WBC of 2.9, repeat CBC     Orders Placed This Encounter  Procedures  . CBC with Differential  . Lipid Panel    Meds ordered this encounter  Medications  . baclofen (LIORESAL) 20 MG tablet    Sig: Take 1 tablet (20 mg total) by mouth 4 (four) times daily.    Dispense:  120 tablet    Refill:  5

## 2014-06-30 NOTE — Assessment & Plan Note (Addendum)
Doing well with PT, no complaints Refill baclofen Now off of tramadol which was for hip pain, s/p L hip replacment Doing well with PM&R

## 2014-06-30 NOTE — Assessment & Plan Note (Signed)
Resolved on last CBC in oct 2015 but WBC of 2.9, repeat CBC

## 2014-07-01 ENCOUNTER — Encounter: Payer: Self-pay | Admitting: Family Medicine

## 2014-07-10 NOTE — Therapy (Signed)
[facility] [address] [place], [place], [address] Phone: [phone]   Fax:  [phone]  Patient Details  Name: Andrew Sullivan MRN: [id number] Date of Birth: 01-01-1971 Referring Provider:  No ref. provider found  Encounter Date: 07/10/2014  PHYSICAL THERAPY DISCHARGE SUMMARY  Visits from Start of Care: 25  Current functional level related to goals / functional outcomes: Pt made progress toward goals. Unable to assess final goals due to pt not returning as planned due to his BOTOX injection being postponed until 08/29/14. See progress toward goals below:      PT Short Term Goals - 05/25/14 2232    PT SHORT TERM GOAL #1   Title demonstrates updated HEP correctly (Target Date: 04/26/14)   Time 1   Period Months   Status Achieved   PT SHORT TERM GOAL #2   Title reaches 4" in standing with RW support with supervision (Target Date: 04/26/14)   Time 1   Period Months   Status Achieved   PT SHORT TERM GOAL #3   Title knee extension in standing with RW support -30 degrees bilaterally. (Target Date: 04/26/14)   Time 1   Period Months   Status Achieved   PT SHORT TERM GOAL #4   Title demonstrates understanding of updated HEP including exercises at fitness center (Target Date: 06/23/14)   Time 1   Period Months   Status New   PT SHORT TERM GOAL #5   Title Gait Velocity >0.55 ft/sec (Target Date: 06/23/14)   Time 1   Period Months   Status New   Additional Short Term Goals   Additional Short Term Goals Yes   PT SHORT TERM GOAL #6   Title reaches 6" with walker support without assist with supervision. (Target Date: 06/23/14)   Time 1   Period Months   Status New         PT Long Term Goals - 06/23/14 1442    PT LONG TERM GOAL #1   Title verbalizes ongoing progressive HEP including fitness program at fitness club. (NEW Target Date: 07/21/14)   Baseline partially met with HEP instructed to date 05/24/2014   Time 2    Period Months   Status Partially Met   PT LONG TERM GOAL #2   Title reports pain with gait /standing in hamstrings </= 2/10 (Target Date: 05/26/14)   Baseline on 05/24/14 reports pain 1/10 with activity   Time 2   Period Months   Status Achieved   PT LONG TERM GOAL #3   Title reaches 6" in standing with RW support safely. (NEW Target Date: 07/21/14)   Baseline only able to reach 4 inches safely with walker support- 05/24/14   Time 2   Period Months   Status Achieved   PT LONG TERM GOAL #4   Title ambulates 300' with RW safely modified independent. (Target Date: 05/26/14)   Baseline 05/22/14- 320 feet with walker with supervision   Time 2   Period Months   Status Achieved   PT LONG TERM GOAL #5   Title Gait Velocity >0.5 ft/sec (Target Date: 05/26/14)   Baseline 05/22/14- 0.5 ft/sec was achieved   Time 2   Period Months   Status Achieved   PT LONG TERM GOAL #6   Title Patient ambulates 350' with rolling walker modified independent. (Target Date: 07/21/14)   Time 2   Period Months   Status Achieved   PT LONG TERM GOAL #7   Title  6-minute Walk Test with rolling walker >180' (Target Date: 07/21/14)   Time 2   Period Months   Status New   PT LONG TERM GOAL #8   Title standing active knee extension right > -7, left > -13 degrees (Target Date: 07/21/14)   Time 2   Period Months   Status New      Remaining deficits: Notable spasticity of BLE, impaired gait pattern, impaired balance, impaired flexibility however pt did make progress in each of these areas during therapy.   Education / Equipment: HEP for stretching, strengthening, walking program  Plan: Patient agrees to discharge.  Patient goals were partially met. Patient is being discharged due to the patient's request. and postponed BOTOX injections. ?????      Delrae Sawyers, PT,DPT,NCS 07/10/2014 4:28 PM Phone [phone] FAX [phone]         [facility] [address] [place], [place], [address] Phone: [phone]   Fax:  [phone]

## 2014-07-24 ENCOUNTER — Ambulatory Visit: Payer: Commercial Managed Care - HMO

## 2014-07-26 ENCOUNTER — Ambulatory Visit: Payer: Commercial Managed Care - HMO

## 2014-07-31 ENCOUNTER — Ambulatory Visit: Payer: Commercial Managed Care - HMO | Admitting: Physical Therapy

## 2014-08-02 ENCOUNTER — Ambulatory Visit: Payer: Commercial Managed Care - HMO | Admitting: Physical Therapy

## 2014-08-04 DIAGNOSIS — Z136 Encounter for screening for cardiovascular disorders: Secondary | ICD-10-CM | POA: Insufficient documentation

## 2014-08-09 ENCOUNTER — Ambulatory Visit: Payer: Commercial Managed Care - HMO

## 2014-08-10 DIAGNOSIS — N319 Neuromuscular dysfunction of bladder, unspecified: Secondary | ICD-10-CM | POA: Diagnosis not present

## 2014-08-10 DIAGNOSIS — N3020 Other chronic cystitis without hematuria: Secondary | ICD-10-CM | POA: Diagnosis not present

## 2014-08-11 ENCOUNTER — Ambulatory Visit: Payer: Commercial Managed Care - HMO | Admitting: Physical Therapy

## 2014-08-14 ENCOUNTER — Ambulatory Visit: Payer: Commercial Managed Care - HMO

## 2014-08-16 ENCOUNTER — Ambulatory Visit: Payer: Commercial Managed Care - HMO

## 2014-08-29 ENCOUNTER — Ambulatory Visit: Payer: Commercial Managed Care - HMO | Admitting: Physical Medicine & Rehabilitation

## 2014-11-17 ENCOUNTER — Ambulatory Visit (INDEPENDENT_AMBULATORY_CARE_PROVIDER_SITE_OTHER): Payer: Commercial Managed Care - HMO | Admitting: Family Medicine

## 2014-11-17 ENCOUNTER — Encounter: Payer: Self-pay | Admitting: Family Medicine

## 2014-11-17 VITALS — BP 126/76 | HR 79 | Temp 98.4°F

## 2014-11-17 DIAGNOSIS — K5900 Constipation, unspecified: Secondary | ICD-10-CM | POA: Diagnosis not present

## 2014-11-17 MED ORDER — DOCUSATE SODIUM 100 MG PO CAPS: 100.0000 mg | ORAL_CAPSULE | Freq: Two times a day (BID) | ORAL | Status: DC | PRN

## 2014-11-17 NOTE — Patient Instructions (Signed)
Sent in stool softener for you Take twice daily as needed, can back down to once per day if needed  Follow up with Dr. Alease Frame if not improved in the next few weeks, sooner if worsening.  Be well, Dr. Ardelia Mems

## 2014-11-17 NOTE — Assessment & Plan Note (Signed)
Discussed options: laxative vs stool softener or both. Pt prefers just stool softener for now. Will rx colace 100mg  BID prn. Can start with daily prn if preferred. F/u with PCP if no improvement with this regimen.

## 2014-11-17 NOTE — Progress Notes (Signed)
Patient ID: Andrew Sullivan, male   DOB: Feb 13, 1971, 44 y.o.   MRN: [id number]  HPI:  Pt presents for same day apppointment for constipation. Would like rx for stool softener. Hx of quadriplegia, wheelchair bound, takes baclofen regularly. Attributes constipation to the baclofen. Denies any blood in stool. Strains hard to have BM but is able to have BM daily around 9am "like clockwork". No abdominal pain. No nausea or vomiting. Feels well otherwise. Constipation ongoing x several months, worse in last few weeks.  ROS: See HPI.  [place]: hx vertebral fracture, quadriplegia, recurrent UTI's  PHYSICAL EXAM: BP 126/76 mmHg  Pulse 79  Temp(Src) 98.4 F (36.9 C) (Oral) Gen: NAD, pleasant, cooperative, wheelchair bound Abd: soft, nontender to palpation, no masses or organomegaly, no peritoneal signs Neuro: uses both arms to propel wheelchair. Speech and cognition normal.  ASSESSMENT/PLAN:  Constipation Discussed options: laxative vs stool softener or both. Pt prefers just stool softener for now. Will rx colace 100mg  BID prn. Can start with daily prn if preferred. F/u with PCP if no improvement with this regimen.   FOLLOW UP: F/u as needed if symptoms worsen or do not improve.   [place]. Ardelia Mems, [place]

## 2015-02-15 ENCOUNTER — Ambulatory Visit: Payer: Commercial Managed Care - HMO | Admitting: Family Medicine

## 2015-02-28 ENCOUNTER — Telehealth: Payer: Self-pay | Admitting: Family Medicine

## 2015-02-28 ENCOUNTER — Other Ambulatory Visit: Payer: Self-pay | Admitting: Family Medicine

## 2015-02-28 DIAGNOSIS — R532 Functional quadriplegia: Secondary | ICD-10-CM

## 2015-02-28 MED ORDER — BACLOFEN 20 MG PO TABS: 20.0000 mg | ORAL_TABLET | Freq: Four times a day (QID) | ORAL | Status: DC

## 2015-02-28 NOTE — Telephone Encounter (Signed)
Pharmacy changed in [place], will forward to PCP.

## 2015-02-28 NOTE — Telephone Encounter (Signed)
Needs refills on baclafen Has appt jan 5 [facility] on [address]. Says he is changing pharmacys because the baclafen is making him constipated.

## 2015-02-28 NOTE — Telephone Encounter (Signed)
Refill placed

## 2015-03-15 ENCOUNTER — Ambulatory Visit: Payer: Commercial Managed Care - HMO | Admitting: Family Medicine

## 2015-03-16 ENCOUNTER — Ambulatory Visit (INDEPENDENT_AMBULATORY_CARE_PROVIDER_SITE_OTHER): Payer: Commercial Managed Care - HMO | Admitting: Family Medicine

## 2015-03-16 ENCOUNTER — Encounter: Payer: Self-pay | Admitting: Family Medicine

## 2015-03-16 VITALS — BP 123/76 | HR 66 | Temp 97.9°F | Ht 74.0 in | Wt 157.0 lb

## 2015-03-16 DIAGNOSIS — G8250 Quadriplegia, unspecified: Secondary | ICD-10-CM | POA: Diagnosis not present

## 2015-03-16 MED ORDER — TIZANIDINE HCL 2 MG PO CAPS: ORAL_CAPSULE | ORAL | Status: DC

## 2015-03-16 NOTE — Patient Instructions (Signed)
It was a pleasure seeing you today in our clinic. Today we discussed your leg pain. Here is the treatment plan we have discussed and agreed upon together:   - I prescribed for you tizanidine. Take 1 capsule 3 times a day for the first week. After that take 2 capsules 3 times a day. I would like to see you back in one month to follow-up on your progress. - I have also written a referral to physical therapy. I will contact you with further information.

## 2015-03-16 NOTE — Assessment & Plan Note (Signed)
Patient is currently wheelchair/walker bound secondary to a C-spine injury which occurred when patient was 45 years old. Current complaints of muscle pain in lower extremities. Patient is on highest dose of baclofen. Patient is interested in returning to physical therapy. - Today I will add on tizanidine to patient's medication regimen. I've asked that he take 2 mg 3 times a day the first week and increase this dose to 4 mg 3 times a day if needed after that. Patient stated his understanding of this regimen. - I placed an order for physical therapy. - I would like to see the patient back in 4 weeks.

## 2015-03-16 NOTE — Progress Notes (Signed)
   HPI  CC: Spastic/functional quadriplegia Legs have been bothering him recently. 4/10. Hurts more w/ walking w/ walker. Last seen by PT/rehab last year. Feels like this gave him better mobility. Had less pain back then, hoping to go back. Pain is located to his lower extremities. Described as cramping and achy. No involvement beyond the knee. No changes in sensation or strength. No history of new trauma or injury.  ROS: Denies fever, chills, vision change, headaches, confusion, or sensory/motor changes. Past medical history and social history reviewed and updated in the EMR as appropriate.  Objective: BP 123/76 mmHg  Pulse 66  Temp(Src) 97.9 F (36.6 C) (Oral)  Ht 6\' 2"  (1.88 m)  Wt 157 lb (71.215 kg)  BMI 20.15 kg/m2 Gen: NAD, alert, cooperative, and pleasant. Ext: No edema, warm, limited motor function in all 4 extremities. Weakness noted in all 4 extremities. Muscle contractions most significant in hamstrings and pelvis. Contraction in hamstrings able to be gradually lessened with traction. Neuro: Alert and oriented, Speech clear  Assessment and plan:  Spastic tetraplegia Patient is currently wheelchair/walker bound secondary to a C-spine injury which occurred when patient was 45 years old. Current complaints of muscle pain in lower extremities. Patient is on highest dose of baclofen. Patient is interested in returning to physical therapy. - Today I will add on tizanidine to patient's medication regimen. I've asked that he take 2 mg 3 times a day the first week and increase this dose to 4 mg 3 times a day if needed after that. Patient stated his understanding of this regimen. - I placed an order for physical therapy. - I would like to see the patient back in 4 weeks.    Orders Placed This Encounter  Procedures  . Ambulatory referral to Physical Therapy    Referral Priority:  Routine    Referral Type:  Physical Medicine    Referral Reason:  Specialty Services Required   Requested Specialty:  Physical Therapy    Number of Visits Requested:  1    Meds ordered this encounter  Medications  . tizanidine (ZANAFLEX) 2 MG capsule    Sig: Take 1 capsule (2mg ) 3 times a day for the first 2 days. Then increase to 2 (4mg ) capsules 3 times a day.    Dispense:  180 capsule    Refill:  0     Elberta Leatherwood, MD,MS,  PGY2 03/16/2015 6:09 PM

## 2015-03-26 ENCOUNTER — Telehealth: Payer: Self-pay | Admitting: *Deleted

## 2015-03-26 NOTE — Telephone Encounter (Signed)
Received fax from pharmacy that patient insurance prefers flexeril or soma instead of the tizanidine prescribed on 03/16/15. Pharmacy wants to know if MD would be willing to change rx to one of these preferred drugs instead.

## 2015-03-27 NOTE — Telephone Encounter (Signed)
Due to the medications patient is on I would prefer to stay with the currently prescribed medication.

## 2015-04-09 ENCOUNTER — Telehealth: Payer: Self-pay | Admitting: *Deleted

## 2015-04-09 NOTE — Telephone Encounter (Signed)
Received a fax from [facility] stating that insurance prefers Flexeril or Soma over Tizanidine.  Please advise.  Derl Barrow, RN

## 2015-04-10 ENCOUNTER — Ambulatory Visit: Payer: Commercial Managed Care - HMO | Attending: Family Medicine

## 2015-04-10 DIAGNOSIS — IMO0001 Reserved for inherently not codable concepts without codable children: Secondary | ICD-10-CM

## 2015-04-10 DIAGNOSIS — M79662 Pain in left lower leg: Secondary | ICD-10-CM | POA: Diagnosis not present

## 2015-04-10 DIAGNOSIS — G8250 Quadriplegia, unspecified: Secondary | ICD-10-CM

## 2015-04-10 DIAGNOSIS — M79661 Pain in right lower leg: Secondary | ICD-10-CM

## 2015-04-10 DIAGNOSIS — R269 Unspecified abnormalities of gait and mobility: Secondary | ICD-10-CM | POA: Diagnosis not present

## 2015-04-10 DIAGNOSIS — M6289 Other specified disorders of muscle: Secondary | ICD-10-CM

## 2015-04-10 DIAGNOSIS — M6249 Contracture of muscle, multiple sites: Secondary | ICD-10-CM | POA: Insufficient documentation

## 2015-04-10 NOTE — Therapy (Signed)
[place], [place], [address] Phone: [phone]   Fax:  [phone]  Physical Therapy Evaluation  Patient Details  Name: Andrew Sullivan MRN: [id number] Date of Birth: August 13, 1970 Referring Provider: Georges Lynch, MD  Encounter Date: 04/10/2015      PT End of Session - 04/10/15 1533    Visit Number 1   Number of Visits 8  12 if improving as of visit 8   Date for PT Re-Evaluation 05/22/15  will add 2 weeks if improving    Authorization Type Medicare   Authorization - Visit Number 1   Authorization - Number of Visits 12   PT Start Time 0300   PT Stop Time 0345   PT Time Calculation (min) 45 min   Activity Tolerance Patient tolerated treatment well;Patient limited by pain   Behavior During Therapy [facility] for tasks assessed/performed      Past Medical History  Diagnosis Date  . Paraparesis of both lower limbs ([place])     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 65 in 81  . History of recurrent UTIs   . Erectile dysfunction   . Gross hematuria   . History of spinal cord injury     58 (age 49 fell from ladder) w/ cervical spine fx  s/p  c4 -- c6  fusion--  residual paraplegic bilateral legs (great below T8 uses w/c and can transfer self  . Spastic tetraplegia [facility])     Past Surgical History  Procedure Laterality Date  . Cervical fusion  1988    C4 -- C6 w/ spinal cord injury  . Hip arthroplasty  08/12/2011    Procedure: ARTHROPLASTY BIPOLAR HIP;  Surgeon: Mauri Pole, MD;  Location: [facility];  Service: Orthopedics;  Laterality: Left;  Hemi-arthroplasty  . Cystoscopy with retrograde pyelogram, ureteroscopy and stent placement Left 12/27/2013    Procedure: [place], URETEROSCOPY AND STENT PLACEMENT;  Surgeon: Reece Packer, MD;  Location: [facility];  Service: Urology;  Laterality: Left;  . Cystoscopy/retrograde/ureteroscopy Left 01/09/2014   Procedure: CYSTOSCOPY/RETROGRADE/URETEROSCOPY, URETERAL BIOPSY AND STENT EXCHANGE;  Surgeon: Alexis Frock, MD;  Location: [facility];  Service: Urology;  Laterality: Left;    There were no vitals filed for this visit.  Visit Diagnosis:  Abnormality of gait - Plan: PT plan of care cert/re-cert  Muscle hypertonicity - Plan: PT plan of care cert/re-cert  Quadriparesis (muscle weakness) ([place]) - Plan: PT plan of care cert/re-cert  Pain in both lower legs - Plan: PT plan of care cert/re-cert      Subjective Assessment - 04/10/15 1459    Subjective Hamstring pain. Botox helped some Last given in 08/2014. PT  flet he developed lumps in body areas so declined any further botox injections. He reports he did not want to return to Neuro as they focused on functional mobility and less hamstrings. This problem  is ongoing and he feels his painis worse since 12/2014.    Pertinent History LT THA   Limitations --  He feels pain limits walking distance and he has fallen with spasms.    How long can you sit comfortably? As needed   How long can you walk comfortably? He states he can walked 40-50 feet then is fatigued.    Diagnostic tests NA   Patient Stated Goals Decreased pain to 4/10.    Currently in Pain? Yes   Pain Score 1   Pain can be 9/10   Pain Location --  hamstrings   Pain Orientation Right;Left;Posterior   Pain Descriptors / Indicators --  pain   Pain Type Acute pain   Pain Onset More than a month ago   Pain Frequency Intermittent   Aggravating Factors  standing and walking   Pain Relieving Factors sitting   Effect of Pain on Daily Activities Limits walking  and standing   Multiple Pain Sites No            [facility] PT Assessment - 04/10/15 1457    Assessment   Medical Diagnosis spastic tetraplegia   Referring Provider Georges Lynch, MD   Onset Date/Surgical Date 12/09/14  But this problem is ongoing for years   Next MD Visit PRN   Prior Therapy 25 visits last yer JAN to [facility]    Precautions   Precautions Fall   Restrictions   Weight Bearing Restrictions No   Balance Screen   Has the patient fallen in the past 6 months Yes   How many times? 3  muscle spasm flexes LE when walking   Has the patient had a decrease in activity level because of a fear of falling?  Yes   Is the patient reluctant to leave their home because of a fear of falling?  No   Home Environment   Living Environment Private residence   Living Arrangements Parent   Type of [place] Access Level entry   Prior Function   Level of Independence Requires assistive device for independence   Cognition   Overall Cognitive Status Within Functional Limits for tasks assessed   Tone   Assessment Location Right Lower Extremity;Left Lower Extremity   ROM / Strength   AROM / PROM / Strength AROM;PROM;Strength   AROM   AROM Assessment Site Hip;Knee;Ankle   Right/Left Hip Right;Left   Right Hip Flexion 70   Right Hip External Rotation  0   Right Hip Internal Rotation  0   Right Hip ABduction 5   Left Hip Extension 0   Left Hip Flexion 50   Left Hip External Rotation  0   Left Hip Internal Rotation  0   Left Hip ABduction 5   Right/Left Knee Right;Left   Right Knee Extension 135   Right Knee Flexion 100  prone   Left Knee Extension 135   Left Knee Flexion 100  prone   Right/Left Ankle Right;Left   Right Ankle Dorsiflexion 80   Left Ankle Dorsiflexion 80   PROM   Overall PROM Comments Ankle DF 95 degrees, Knee flexion normal in prone and hip rotation in prone WFL LT WNLRT.   LT hip flexion 80 degrees and RT 100 degrees .    Strength   Overall Strength Comments All LE groups 1-2/5 except hamstrins 3/5,    Flexibility   Soft Tissue Assessment /Muscle Length yes   Hamstrings I was able to lift each leg 40-45 degrees but opposit leg was pushe d back to mat . Each leg would IR with the stretching    Transfers   Transfers Independent with all Transfers   Ambulation/Gait   Ambulation/Gait  Yes   Ambulation/Gait Assistance 6: Modified independent (Device/Increase time)   Ambulation Distance (Feet) 50 Feet   Assistive device Rolling walker   Gait Pattern Step-through pattern;Right flexed knee in stance;Left flexed knee in stance;Decreased dorsiflexion - right;Decreased dorsiflexion - left;Decreased weight shift to right;Decreased weight shift to left;Decreased stride length   Ambulation Surface Level;Indoor   RLE Tone   RLE Tone  Severe;Hypertonic   RLE Tone   Hypertonic Details Extension hips with adduction and IR  and PF                           PT Education - 05-02-2015 1532    Education provided Yes   Education Details POC   Sullivan(s) Educated Patient   Methods Explanation   Comprehension Verbalized understanding             PT Long Term Goals - 05-02-15 1600    PT LONG TERM GOAL #1   Time 4   Period Weeks   Status New   PT LONG TERM GOAL #2   Title reports pain with gait /standing in hamstrings </= 2/10 (Target Date: 05/08/15)   Time 4   Period Weeks   Status New   PT LONG TERM GOAL #4   Title ambulates 200' with RW safely modified independent. (Target Date: 05/08/15) to demo decr pain and function   PT LONG TERM GOAL #5   Title Gait Velocity >0.5 ft/sec (Target Date: 05/08/15)   Time 4   Period Weeks   Status New               Plan - 2015-05-02 1546    Clinical Impression Statement Mr Schlueter returns to PT with same complaint of hamstring pain causing LE spasms causing falls and difficulty walking. He has no pain/spasms sitting, only with walking/ standing. He was seen here a couple of years ago and we stretched with some benefit with improved gait but was not able to eliminate the leg pain. He was seen in Neuro rehab earlier this year for 25 visits and was walking 500 feet in 23 min which is not practical as he only walks 50 -75 at most for normal tasks. Marland Kitchen His muscle weakness in both LE is not functional to make him a safe  ambulator makeing ambulation a task mostly done with UE strength .  He is Independent at home with selfcare and transfers and he drives.   Most tightness is in LT hip that has a THA . Spasms of hamstrings and hip adductors and IR along with PF limit active motion also.   BOTOX in the additional muscles  other than hamstrings may be of benefit. He does not like Botox as it made him feel bad with bumps on his body. I don't think we will make much of a difference as he also states spasms will come after pain so his pain may be neurologic in origin and not  due to tissue isssues/spasms .     Pt will benefit from skilled therapeutic intervention in order to improve on the following deficits Pain;Decreased activity tolerance;Increased muscle spasms   Rehab Potential Fair   PT Frequency 2x / week   PT Duration 4 weeks  may to 12 if improved at visit 8   PT Treatment/Interventions Passive range of motion;Manual techniques;Patient/family education;Therapeutic exercise;Moist Heat   PT Next Visit Plan Stretching emphasis hamstrings, hip abduction and ER   Consulted and Agree with Plan of Care Patient          G-Codes - May 02, 2015 1544    Functional Assessment Tool Used clinical judgement   Functional Limitation Mobility: Walking and moving around   Mobility: Walking and Moving Around Current Status [id number]) At least 60 percent but less than 80 percent impaired, limited or restricted   Mobility: Walking and Moving Around Goal Status [phone])  At least 40 percent but less than 60 percent impaired, limited or restricted       Problem List Patient Active Problem List   Diagnosis Date Noted  . Constipation 11/17/2014  . Screening for cardiovascular condition 08/04/2014  . Annual physical exam 06/30/2014  . Spastic tetraplegia ([place]) 12/06/2013  . Hematuria 11/05/2013  . Contact dermatitis 05/12/2013  . Rash of hands 05/12/2013  . Fatigue 11/16/2012  . Anemia 11/16/2012  . History of recurrent UTIs  05/19/2010  . Functional quadriplegia ([place]) 05/10/2009  . HEMATURIA 07/29/2007  . Closed fracture of cervical vertebra ([place]) 07/29/2007    [name] PT 04/10/2015, 4:17 PM  [facility] [address] [place], [place], [address] Phone: [phone]   Fax:  [phone]  Name: Andrew Sullivan MRN: [id number] Date of Birth: 1970-09-09

## 2015-04-10 NOTE — Telephone Encounter (Signed)
PA form placed in provider for Tizanidine.  Derl Barrow, RN

## 2015-04-11 NOTE — Telephone Encounter (Signed)
PA form faxed to [employer] for review.  Review process could take 24-72 hours to complete.  Manaia Samad L, RN  

## 2015-04-12 ENCOUNTER — Ambulatory Visit: Payer: Commercial Managed Care - HMO | Attending: Family Medicine | Admitting: Physical Therapy

## 2015-04-12 DIAGNOSIS — M6249 Contracture of muscle, multiple sites: Secondary | ICD-10-CM | POA: Insufficient documentation

## 2015-04-12 DIAGNOSIS — M79661 Pain in right lower leg: Secondary | ICD-10-CM | POA: Diagnosis not present

## 2015-04-12 DIAGNOSIS — IMO0001 Reserved for inherently not codable concepts without codable children: Secondary | ICD-10-CM

## 2015-04-12 DIAGNOSIS — M79662 Pain in left lower leg: Secondary | ICD-10-CM | POA: Insufficient documentation

## 2015-04-12 DIAGNOSIS — R6889 Other general symptoms and signs: Secondary | ICD-10-CM | POA: Insufficient documentation

## 2015-04-12 DIAGNOSIS — R269 Unspecified abnormalities of gait and mobility: Secondary | ICD-10-CM | POA: Insufficient documentation

## 2015-04-12 DIAGNOSIS — G8250 Quadriplegia, unspecified: Secondary | ICD-10-CM | POA: Diagnosis not present

## 2015-04-12 DIAGNOSIS — M6289 Other specified disorders of muscle: Secondary | ICD-10-CM

## 2015-04-12 NOTE — Therapy (Signed)
[place], [place], [address] Phone: [phone]   Fax:  [phone]  Physical Therapy Treatment  Patient Details  Name: Andrew Sullivan MRN: [id number] Date of Birth: 07-23-1970 Referring Provider: Georges Lynch, MD  Encounter Date: 04/12/2015      PT End of Session - 04/12/15 1113    Visit Number 2   Number of Visits 8  12   Date for PT Re-Evaluation 05/22/15  will add 2 weeks if improving   PT Start Time 1105   PT Stop Time 1200   PT Time Calculation (min) 55 min      Past Medical History  Diagnosis Date  . Paraparesis of both lower limbs ([place])     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 45 in 24  . History of recurrent UTIs   . Erectile dysfunction   . Gross hematuria   . History of spinal cord injury     45 (age 63 fell from ladder) w/ cervical spine fx  s/p  c4 -- c6  fusion--  residual paraplegic bilateral legs (great below T8 uses w/c and can transfer self  . Spastic tetraplegia [facility])     Past Surgical History  Procedure Laterality Date  . Cervical fusion  1988    C4 -- C6 w/ spinal cord injury  . Hip arthroplasty  08/12/2011    Procedure: ARTHROPLASTY BIPOLAR HIP;  Surgeon: Mauri Pole, MD;  [facility];  Service: Orthopedics;  Laterality: Left;  Hemi-arthroplasty  . Cystoscopy with retrograde pyelogram, ureteroscopy and stent placement Left 12/27/2013    Procedure: [place], URETEROSCOPY AND STENT PLACEMENT;  Surgeon: Reece Packer, MD;  Location: [place];  Service: Urology;  Laterality: Left;  . Cystoscopy/retrograde/ureteroscopy Left 01/09/2014    Procedure: CYSTOSCOPY/RETROGRADE/URETEROSCOPY, URETERAL BIOPSY AND STENT EXCHANGE;  Surgeon: Alexis Frock, MD;  [facility];  Service: Urology;  Laterality: Left;    There were no vitals filed for this visit.  Visit Diagnosis:  Muscle hypertonicity  Quadriparesis  (muscle weakness) (HCC)  Pain in both lower legs  Activity intolerance  Abnormality of gait      Subjective Assessment - 04/12/15 1111    Subjective Both hamstring are in pain 4/10. That is why I am a [place] late.    Currently in Pain? Yes   Pain Score 4    Pain Location --  hamstrings   Pain Descriptors / Indicators Squeezing;Aching   Pain Type Acute pain                         [facility] Adult PT Treatment/Exercise - 04/12/15 0001    Modalities   Modalities Moist Heat   Moist Heat Therapy   Number Minutes Moist Heat 10 Minutes  pre tx and 15 minutes after tx   Moist Heat Location Other (comment)  hamstrings in prone   Manual Therapy   Manual Therapy Soft tissue mobilization;Passive ROM   Soft tissue mobilization Prone soft tissue work (IASTM) using massage roller and rock blade to bilateral hamstrings. Focued distal for rock blade.    Passive ROM Bilateral hamstring stretching with overpressure to extend opposite knee 3 x 30 seconds each after soft tissue work                      PT Long Term Goals - 04/10/15 1600    PT LONG TERM GOAL #1  Time 4   Period Weeks   Status New   PT LONG TERM GOAL #2   Title reports pain with gait /standing in hamstrings </= 2/10 (Target Date: 05/08/15)   Time 4   Period Weeks   Status New   PT LONG TERM GOAL #4   Title ambulates 200' with RW safely modified independent. (Target Date: 05/08/15) to demo decr pain and function   PT LONG TERM GOAL #5   Title Gait Velocity >0.5 ft/sec (Target Date: 05/08/15)   Time 4   Period Weeks   Status New               Plan - 04/12/15 1328    Clinical Impression Statement Pt reports bilateral hamstring pain this morning which attributed to him being a few minutes late. [place] used prior to [employer] tissue work to bilateral hamstrings in prone follwed by passive bilateral hamstring stretching a reapplication of [place] post. Pt reports no increased pain with manual or  PROM.   PT Next Visit Plan assess benefit of last treatment- Stretching emphasis hamstrings, hip abduction and ER        Problem List Patient Active Problem List   Diagnosis Date Noted  . Constipation 11/17/2014  . Screening for cardiovascular condition 08/04/2014  . Annual physical exam 06/30/2014  . Spastic tetraplegia ([place]) 12/06/2013  . Hematuria 11/05/2013  . Contact dermatitis 05/12/2013  . Rash of hands 05/12/2013  . Fatigue 11/16/2012  . Anemia 11/16/2012  . History of recurrent UTIs 05/19/2010  . Functional quadriplegia ([place]) 05/10/2009  . HEMATURIA 07/29/2007  . Closed fracture of cervical vertebra ([place]) 07/29/2007    Dorene Ar, PTA 04/12/2015, 1:44 PM  [place] [address], [place], [address] Phone: [phone]   Fax:  [phone]  Name: KAYLE SHIVER MRN: [id number] Date of Birth: 1970-08-07

## 2015-04-13 NOTE — Telephone Encounter (Signed)
PA was denied for Tizanidine via [employer].  Denial forms placed in provider box for review.  Derl Barrow, RN

## 2015-04-16 ENCOUNTER — Ambulatory Visit: Payer: Commercial Managed Care - HMO

## 2015-04-16 DIAGNOSIS — IMO0001 Reserved for inherently not codable concepts without codable children: Secondary | ICD-10-CM

## 2015-04-16 DIAGNOSIS — R269 Unspecified abnormalities of gait and mobility: Secondary | ICD-10-CM

## 2015-04-16 DIAGNOSIS — M79661 Pain in right lower leg: Secondary | ICD-10-CM

## 2015-04-16 DIAGNOSIS — G8250 Quadriplegia, unspecified: Secondary | ICD-10-CM

## 2015-04-16 DIAGNOSIS — M79662 Pain in left lower leg: Secondary | ICD-10-CM | POA: Diagnosis not present

## 2015-04-16 DIAGNOSIS — R6889 Other general symptoms and signs: Secondary | ICD-10-CM

## 2015-04-16 DIAGNOSIS — M6289 Other specified disorders of muscle: Secondary | ICD-10-CM

## 2015-04-16 DIAGNOSIS — M6249 Contracture of muscle, multiple sites: Secondary | ICD-10-CM | POA: Diagnosis not present

## 2015-04-16 NOTE — Therapy (Signed)
[place], [place], [address] Phone: [phone]   Fax:  [phone]  Physical Therapy Treatment  Patient Details  Name: Andrew Sullivan MRN: [id number] Date of Birth: 11/21/1970 Referring Provider: Georges Lynch, MD  Encounter Date: 04/16/2015      PT End of Session - 04/16/15 1220    Visit Number 3   Number of Visits 8  12 if improving   Date for PT Re-Evaluation 05/22/15   PT Start Time 1140   PT Stop Time 1230   PT Time Calculation (min) 50 min   Activity Tolerance Patient tolerated treatment well   Behavior During Therapy [facility] for tasks assessed/performed      Past Medical History  Diagnosis Date  . Paraparesis of both lower limbs ([place])     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 45 in 8  . History of recurrent UTIs   . Erectile dysfunction   . Gross hematuria   . History of spinal cord injury     36 (age 45 fell from ladder) w/ cervical spine fx  s/p  c4 -- c6  fusion--  residual paraplegic bilateral legs (great below T8 uses w/c and can transfer self  . Spastic tetraplegia [facility])     Past Surgical History  Procedure Laterality Date  . Cervical fusion  1988    C4 -- C6 w/ spinal cord injury  . Hip arthroplasty  08/12/2011    Procedure: ARTHROPLASTY BIPOLAR HIP;  Surgeon: Mauri Pole, MD;  [facility];  Service: Orthopedics;  Laterality: Left;  Hemi-arthroplasty  . Cystoscopy with retrograde pyelogram, ureteroscopy and stent placement Left 12/27/2013    Procedure: [place], URETEROSCOPY AND STENT PLACEMENT;  Surgeon: Reece Packer, MD;  Location: [place];  Service: Urology;  Laterality: Left;  . Cystoscopy/retrograde/ureteroscopy Left 01/09/2014    Procedure: CYSTOSCOPY/RETROGRADE/URETEROSCOPY, URETERAL BIOPSY AND STENT EXCHANGE;  Surgeon: Alexis Frock, MD;  [facility];  Service: Urology;  Laterality: Left;    There  were no vitals filed for this visit.  Visit Diagnosis:  Muscle hypertonicity  Pain in both lower legs  Quadriparesis (muscle weakness) (HCC)  Activity intolerance  Abnormality of gait      Subjective Assessment - 04/16/15 1133    Subjective (p) Both hamstring are in pain 4/10.   Currently in Pain? (p) Yes   Pain Score (p) 4    Pain Location (p) --  hamstrings                         [facility] Adult PT Treatment/Exercise - 04/16/15 0001    Moist Heat Therapy   Number Minutes Moist Heat 12 Minutes  post treatment   Moist Heat Location --  posterior distal thigh with hips abducted by wedge   Manual Therapy   Soft tissue mobilization Prone soft tissue work (IASTM) using massage roller and rock blade to bilateral hamstrings. Focued distal for rock blade.    Passive ROM Bilateral hamstring stretching with overpressure to extend opposite knee and pressure to ER each hip 5 min each after soft tissue work                      PT Long Term Goals - 04/10/15 1600    PT LONG TERM GOAL #1   Time 4   Period Weeks   Status New   PT LONG TERM GOAL #2  Title reports pain with gait /standing in hamstrings </= 2/10 (Target Date: 05/08/15)   Time 4   Period Weeks   Status New   PT LONG TERM GOAL #4   Title ambulates 200' with RW safely modified independent. (Target Date: 05/08/15) to demo decr pain and function   PT LONG TERM GOAL #5   Title Gait Velocity >0.5 ft/sec (Target Date: 05/08/15)   Time 4   Period Weeks   Status New               Plan - 04/16/15 1222    Clinical Impression Statement He continues with pain with hamstring stretching. His range and tolerance improved with mainytaining stretching for a period of time. . Tone pattern with hip IR and adduction dominate LE   Noted in prone hip extension and rotation  ranges normal due to no increased muscle tone in these groups in prone.  He is ablre to do some light ast assisted (decreased  friction) knee lfexion extension but not more.    PT Next Visit Plan Continue stretching hamstrings and with this hip ER and abduction and opposit hip flexor stretching with this   Consulted and Agree with Plan of Care Patient        Problem List Patient Active Problem List   Diagnosis Date Noted  . Constipation 11/17/2014  . Screening for cardiovascular condition 08/04/2014  . Annual physical exam 06/30/2014  . Spastic tetraplegia ([place]) 12/06/2013  . Hematuria 11/05/2013  . Contact dermatitis 05/12/2013  . Rash of hands 05/12/2013  . Fatigue 11/16/2012  . Anemia 11/16/2012  . History of recurrent UTIs 05/19/2010  . Functional quadriplegia ([place]) 05/10/2009  . HEMATURIA 07/29/2007  . Closed fracture of cervical vertebra ([place]) 07/29/2007    [name] PT 04/16/2015, 12:27 PM  [place] [facility] [address] [place], [place], [address] Phone: [phone]   Fax:  [phone]  Name: Andrew Sullivan MRN: [id number] Date of Birth: 1970/08/20

## 2015-04-19 ENCOUNTER — Ambulatory Visit: Payer: Commercial Managed Care - HMO | Attending: Family Medicine

## 2015-04-19 DIAGNOSIS — M79662 Pain in left lower leg: Secondary | ICD-10-CM | POA: Diagnosis not present

## 2015-04-19 DIAGNOSIS — G8250 Quadriplegia, unspecified: Secondary | ICD-10-CM

## 2015-04-19 DIAGNOSIS — M6249 Contracture of muscle, multiple sites: Secondary | ICD-10-CM | POA: Diagnosis not present

## 2015-04-19 DIAGNOSIS — M79661 Pain in right lower leg: Secondary | ICD-10-CM | POA: Insufficient documentation

## 2015-04-19 DIAGNOSIS — M6289 Other specified disorders of muscle: Secondary | ICD-10-CM

## 2015-04-19 DIAGNOSIS — R6889 Other general symptoms and signs: Secondary | ICD-10-CM | POA: Diagnosis not present

## 2015-04-19 DIAGNOSIS — R269 Unspecified abnormalities of gait and mobility: Secondary | ICD-10-CM | POA: Diagnosis not present

## 2015-04-19 DIAGNOSIS — IMO0001 Reserved for inherently not codable concepts without codable children: Secondary | ICD-10-CM

## 2015-04-19 NOTE — Therapy (Signed)
[place], [place], [address] Phone: [phone]   Fax:  [phone]  Physical Therapy Treatment  Patient Details  Name: Andrew Sullivan MRN: [id number] Date of Birth: 10/20/1970 Referring Provider: Georges Lynch, MD  Encounter Date: 04/19/2015      PT End of Session - 04/19/15 1718    Visit Number 4   Number of Visits 8   Date for PT Re-Evaluation 05/22/15   PT Start Time 0430   PT Stop Time 0525   PT Time Calculation (min) 55 min   Activity Tolerance Patient tolerated treatment well   Behavior During Therapy [facility] for tasks assessed/performed      Past Medical History  Diagnosis Date  . Paraparesis of both lower limbs ([place])     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 78 in 62  . History of recurrent UTIs   . Erectile dysfunction   . Gross hematuria   . History of spinal cord injury     31 (age 81 fell from ladder) w/ cervical spine fx  s/p  c4 -- c6  fusion--  residual paraplegic bilateral legs (great below T8 uses w/c and can transfer self  . Spastic tetraplegia [facility])     Past Surgical History  Procedure Laterality Date  . Cervical fusion  1988    C4 -- C6 w/ spinal cord injury  . Hip arthroplasty  08/12/2011    Procedure: ARTHROPLASTY BIPOLAR HIP;  Surgeon: Mauri Pole, MD;  [facility];  Service: Orthopedics;  Laterality: Left;  Hemi-arthroplasty  . Cystoscopy with retrograde pyelogram, ureteroscopy and stent placement Left 12/27/2013    Procedure: [place], URETEROSCOPY AND STENT PLACEMENT;  Surgeon: Reece Packer, MD;  Location: [place];  Service: Urology;  Laterality: Left;  . Cystoscopy/retrograde/ureteroscopy Left 01/09/2014    Procedure: CYSTOSCOPY/RETROGRADE/URETEROSCOPY, URETERAL BIOPSY AND STENT EXCHANGE;  Surgeon: Alexis Frock, MD;  [facility];  Service: Urology;  Laterality: Left;    There were no vitals filed  for this visit.  Visit Diagnosis:  Muscle hypertonicity  Pain in both lower legs  Quadriparesis (muscle weakness) (HCC)  Activity intolerance  Abnormality of gait      Subjective Assessment - 04/19/15 1638    Subjective Doing real good after last 2 sessions.    Currently in Pain? No/denies                         [facility] Adult PT Treatment/Exercise - 04/19/15 1648    Modalities   Modalities Moist Heat   Moist Heat Therapy   Number Minutes Moist Heat 15 Minutes   Moist Heat Location --  post thighs   Manual Therapy   Soft tissue mobilization Prone soft tissue work (IASTM) using massage roller and rock blade to bilateral hamstrings. Focued distal for rock blade.    Passive ROM Bilateral hamstring stretching with overpressure to extend opposite knee and pressure to ER each hip 5 min each after soft tissue work      PRone knee flexion and hip extension and rotation int and ext                PT Long Term Goals - 04/19/15 1720    PT LONG TERM GOAL #1   Title verbalizes ongoing progressive HEP including fitness program at fitness club. (NEW [employer] Date: 07/21/14)   Status On-going   PT LONG TERM GOAL #2   Title  reports pain with gait /standing in hamstrings </= 2/10 (Target Date: 05/08/15)   Status On-going   PT LONG TERM GOAL #3   Title reaches 6" in standing with RW support safely. (NEW [employer] Date: 07/21/14)   Status On-going   PT LONG TERM GOAL #4   Title ambulates 200' with RW safely modified independent. (Target Date: 05/08/15) to demo decr pain and function   Status On-going   PT LONG TERM GOAL #5   Title Gait Velocity >0.5 ft/sec (Target Date: 05/08/15)   Status Unable to assess               Plan - 04/19/15 1719    Clinical Impression Statement [name] reports improved comfort sicne starting PT . we will do 4 more sessions and probable DC or extend 2 more weeks 1x/week   PT Next Visit Plan Continue stretching hamstrings and with  this hip ER and abduction and opposite hip flexor stretching with this   Consulted and Agree with Plan of Care Patient        Problem List Patient Active Problem List   Diagnosis Date Noted  . Constipation 11/17/2014  . Screening for cardiovascular condition 08/04/2014  . Annual physical exam 06/30/2014  . Spastic tetraplegia ([place]) 12/06/2013  . Hematuria 11/05/2013  . Contact dermatitis 05/12/2013  . Rash of hands 05/12/2013  . Fatigue 11/16/2012  . Anemia 11/16/2012  . History of recurrent UTIs 05/19/2010  . Functional quadriplegia ([place]) 05/10/2009  . HEMATURIA 07/29/2007  . Closed fracture of cervical vertebra ([place]) 07/29/2007    [name] PT 04/19/2015, 5:22 PM  [facility] [address] [place], [place], [address] Phone: [phone]   Fax:  [phone]  Name: Andrew Sullivan MRN: [id number] Date of Birth: [birth year]

## 2015-04-23 ENCOUNTER — Ambulatory Visit: Payer: Commercial Managed Care - HMO | Admitting: Physical Therapy

## 2015-04-23 DIAGNOSIS — M6249 Contracture of muscle, multiple sites: Secondary | ICD-10-CM | POA: Diagnosis not present

## 2015-04-23 DIAGNOSIS — G8250 Quadriplegia, unspecified: Secondary | ICD-10-CM | POA: Diagnosis not present

## 2015-04-23 DIAGNOSIS — M79661 Pain in right lower leg: Secondary | ICD-10-CM

## 2015-04-23 DIAGNOSIS — M6289 Other specified disorders of muscle: Secondary | ICD-10-CM

## 2015-04-23 DIAGNOSIS — R269 Unspecified abnormalities of gait and mobility: Secondary | ICD-10-CM | POA: Diagnosis not present

## 2015-04-23 DIAGNOSIS — M79662 Pain in left lower leg: Secondary | ICD-10-CM | POA: Diagnosis not present

## 2015-04-23 DIAGNOSIS — IMO0001 Reserved for inherently not codable concepts without codable children: Secondary | ICD-10-CM

## 2015-04-23 DIAGNOSIS — R6889 Other general symptoms and signs: Secondary | ICD-10-CM

## 2015-04-23 NOTE — Telephone Encounter (Signed)
Will forward to white team.  Jazmin Hartsell,CMA  

## 2015-04-23 NOTE — Telephone Encounter (Signed)
Patient must have tried and failed methocarbamol tablets.  It is the preferred drug for [facility].  Provider need to explain why the preferred drug does not work for patient's medical condition and/or would it cause bad side effects.  Derl Barrow, RN

## 2015-04-23 NOTE — Telephone Encounter (Signed)
Attempted to call patient with regards to medication.  I wanted to clarify his medicines.  It appears he also has Baclofen on his list.  I want to know the effectiveness of this medication.  He will need to be switched.  I can send in soma IN PLACE of baclofen.  Please let him know this if he calls back.

## 2015-04-23 NOTE — Therapy (Signed)
[place], [place], [address] Phone: [phone]   Fax:  [phone]  Physical Therapy Treatment  Patient Details  Name: Andrew Sullivan MRN: [id number] Date of Birth: May 27, 1970 Referring Provider: Georges Lynch, MD  Encounter Date: 04/23/2015      PT End of Session - 04/23/15 1604    Visit Number 5   Number of Visits 8   Date for PT Re-Evaluation 05/22/15   PT Start Time 1502   PT Stop Time [id number]   PT Time Calculation (min) 63 min   Activity Tolerance Patient tolerated treatment well;No increased pain   Behavior During Therapy [facility] for tasks assessed/performed      Past Medical History  Diagnosis Date  . Paraparesis of both lower limbs ([place])     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 83 in 67  . History of recurrent UTIs   . Erectile dysfunction   . Gross hematuria   . History of spinal cord injury     42 (age 59 fell from ladder) w/ cervical spine fx  s/p  c4 -- c6  fusion--  residual paraplegic bilateral legs (great below T8 uses w/c and can transfer self  . Spastic tetraplegia [facility])     Past Surgical History  Procedure Laterality Date  . Cervical fusion  1988    C4 -- C6 w/ spinal cord injury  . Hip arthroplasty  08/12/2011    Procedure: ARTHROPLASTY BIPOLAR HIP;  Surgeon: Mauri Pole, MD;  [facility];  Service: Orthopedics;  Laterality: Left;  Hemi-arthroplasty  . Cystoscopy with retrograde pyelogram, ureteroscopy and stent placement Left 12/27/2013    Procedure: [place], URETEROSCOPY AND STENT PLACEMENT;  Surgeon: Reece Packer, MD;  Location: [place];  Service: Urology;  Laterality: Left;  . Cystoscopy/retrograde/ureteroscopy Left 01/09/2014    Procedure: CYSTOSCOPY/RETROGRADE/URETEROSCOPY, URETERAL BIOPSY AND STENT EXCHANGE;  Surgeon: Alexis Frock, MD;  [facility];  Service: Urology;  Laterality: Left;    There  were no vitals filed for this visit.  Visit Diagnosis:  Muscle hypertonicity  Pain in both lower legs  Quadriparesis (muscle weakness) (HCC)  Activity intolerance      Subjective Assessment - 04/23/15 1600    Subjective Pain 3/10 with walking, none at rest.  Pain decerased a lot.   Currently in Pain? No/denies   Pain Score 3    Pain Location Leg   Pain Orientation Right;Left;Posterior   Pain Descriptors / Indicators Aching;Squeezing   Pain Frequency Intermittent   Aggravating Factors  standing/walking   Pain Relieving Factors sitting                         [facility] Adult PT Treatment/Exercise - 04/23/15 1505    Modalities   Modalities Moist Heat   Moist Heat Therapy   Number Minutes Moist Heat 15 Minutes   Moist Heat Location --  hamstrings with extra layers each while abduction stretch   Manual Therapy   Manual therapy comments Instrument assist to posterior knee thigh softening tissues of hamstrings. followed by gentle hamstring stretch,  some rotation used as needed to decrease tone.     Passive ROM hamstrings.                       PT Long Term Goals - 04/23/15 1606    PT LONG TERM GOAL #2   Title reports pain with gait /  standing in hamstrings </= 2/10 (Target Date: 05/08/15)   Baseline 3/10   Time 4   Period Weeks   Status On-going               Plan - 04/23/15 1605    Clinical Impression Statement Pain gradually decreasing with PT.     PT Next Visit Plan Continue stretching hamstrings and with this hip ER and abduction and opposite hip flexor stretching with this.  Clarify current goals from goals in the past.    Consulted and Agree with Plan of Care Patient        Problem List Patient Active Problem List   Diagnosis Date Noted  . Constipation 11/17/2014  . Screening for cardiovascular condition 08/04/2014  . Annual physical exam 06/30/2014  . Spastic tetraplegia ([place]) 12/06/2013  . Hematuria 11/05/2013  . Contact  dermatitis 05/12/2013  . Rash of hands 05/12/2013  . Fatigue 11/16/2012  . Anemia 11/16/2012  . History of recurrent UTIs 05/19/2010  . Functional quadriplegia ([place]) 05/10/2009  . HEMATURIA 07/29/2007  . Closed fracture of cervical vertebra ([place]) 07/29/2007    HARRIS,KAREN 04/23/2015, 4:10 PM  [place], [place], [address] Phone: [phone]   Fax:  [phone]  Name: Andrew Sullivan MRN: [id number] Date of Birth: 06-Mar-1971    Melvenia Needles, PTA 04/23/2015 4:10 PM Phone: [phone] Fax: [phone]

## 2015-04-26 ENCOUNTER — Ambulatory Visit: Payer: Commercial Managed Care - HMO

## 2015-04-26 DIAGNOSIS — G8250 Quadriplegia, unspecified: Secondary | ICD-10-CM

## 2015-04-26 DIAGNOSIS — M79662 Pain in left lower leg: Secondary | ICD-10-CM

## 2015-04-26 DIAGNOSIS — R6889 Other general symptoms and signs: Secondary | ICD-10-CM

## 2015-04-26 DIAGNOSIS — R269 Unspecified abnormalities of gait and mobility: Secondary | ICD-10-CM

## 2015-04-26 DIAGNOSIS — M79661 Pain in right lower leg: Secondary | ICD-10-CM | POA: Diagnosis not present

## 2015-04-26 DIAGNOSIS — M6289 Other specified disorders of muscle: Secondary | ICD-10-CM

## 2015-04-26 DIAGNOSIS — M6249 Contracture of muscle, multiple sites: Secondary | ICD-10-CM | POA: Diagnosis not present

## 2015-04-26 DIAGNOSIS — IMO0001 Reserved for inherently not codable concepts without codable children: Secondary | ICD-10-CM

## 2015-04-26 NOTE — Therapy (Signed)
[place], [place], [address] Phone: [phone]   Fax:  [phone]  Physical Therapy Treatment  Patient Details  Name: Andrew Sullivan MRN: [id number] Date of Birth: 1970/05/22 Referring Provider: Georges Lynch, MD  Encounter Date: 04/26/2015      PT End of Session - 04/26/15 1537    Visit Number 6   Number of Visits 8   Date for PT Re-Evaluation 05/22/15   PT Start Time 0300   PT Stop Time 0350   PT Time Calculation (min) 50 min   Activity Tolerance Patient tolerated treatment well   Behavior During Therapy [facility] for tasks assessed/performed      Past Medical History  Diagnosis Date  . Paraparesis of both lower limbs ([place])     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 64 in 55  . History of recurrent UTIs   . Erectile dysfunction   . Gross hematuria   . History of spinal cord injury     62 (age 85 fell from ladder) w/ cervical spine fx  s/p  c4 -- c6  fusion--  residual paraplegic bilateral legs (great below T8 uses w/c and can transfer self  . Spastic tetraplegia [facility])     Past Surgical History  Procedure Laterality Date  . Cervical fusion  1988    C4 -- C6 w/ spinal cord injury  . Hip arthroplasty  08/12/2011    Procedure: ARTHROPLASTY BIPOLAR HIP;  Surgeon: Mauri Pole, MD;  [facility];  Service: Orthopedics;  Laterality: Left;  Hemi-arthroplasty  . Cystoscopy with retrograde pyelogram, ureteroscopy and stent placement Left 12/27/2013    Procedure: [place], URETEROSCOPY AND STENT PLACEMENT;  Surgeon: Reece Packer, MD;  Location: [place];  Service: Urology;  Laterality: Left;  . Cystoscopy/retrograde/ureteroscopy Left 01/09/2014    Procedure: CYSTOSCOPY/RETROGRADE/URETEROSCOPY, URETERAL BIOPSY AND STENT EXCHANGE;  Surgeon: Alexis Frock, MD;  [facility];  Service: Urology;  Laterality: Left;    There were no vitals filed  for this visit.  Visit Diagnosis:  Muscle hypertonicity  Pain in both lower legs  Quadriparesis (muscle weakness) (HCC)  Activity intolerance  Abnormality of gait      Subjective Assessment - 04/26/15 1510    Subjective No pain at rest. No pakin walking ack from lobby and from car.      Currently in Pain? No/denies                         [facility] Adult PT Treatment/Exercise - 04/26/15 1511    Knee/Hip Exercises: Stretches   Passive Hamstring Stretch Right;Left;2 reps   Moist Heat Therapy   Number Minutes Moist Heat 20 Minutes   Moist Heat Location --  posterior thighs/hams   Manual Therapy   Soft tissue mobilization Prone soft tissue work (IASTM) using massage roller and rock blade to bilateral hamstrings. Focued distal for rock blade.    Passive ROM hamstrings.  stretching in supine sustained with pessure to opposite leg for hip extension and with ER sttretch with SLR to decr tone.  Prone quadstretch and abduction stretch in prone and supine using bolster to asssit in positional stretching                       PT Long Term Goals - 04/26/15 1540    PT LONG TERM GOAL #2   Title reports pain with gait /standing in hamstrings </=  2/10 (Target Date: 05/08/15)   Status On-going   PT LONG TERM GOAL #4   Title ambulates 200' with RW safely modified independent. (Target Date: 05/08/15) to demo decr pain and function   Status On-going   PT LONG TERM GOAL #5   Title Gait Velocity >0.5 ft/sec (Target Date: 05/08/15)   Status On-going               Plan - 04/26/15 1537    Clinical Impression Statement Increased muscle tone unchanged and limits stretching/range . Subjectively he is doing better and next visit assess goals and decidde to extend but it appears  probable discharge next week.    PT Next Visit Plan Continue stretching hamstrings and with this hip ER and abduction and opposite hip flexor stretching with this.  . Assess goal    Consulted  and Agree with Plan of Care Patient        Problem List Patient Active Problem List   Diagnosis Date Noted  . Constipation 11/17/2014  . Screening for cardiovascular condition 08/04/2014  . Annual physical exam 06/30/2014  . Spastic tetraplegia ([place]) 12/06/2013  . Hematuria 11/05/2013  . Contact dermatitis 05/12/2013  . Rash of hands 05/12/2013  . Fatigue 11/16/2012  . Anemia 11/16/2012  . History of recurrent UTIs 05/19/2010  . Functional quadriplegia ([place]) 05/10/2009  . HEMATURIA 07/29/2007  . Closed fracture of cervical vertebra ([place]) 07/29/2007    [name] PT 04/26/2015, 3:41 PM  [facility] [address] [place], [place], [address] Phone: [phone]   Fax:  [phone]  Name: Andrew Sullivan MRN: [id number] Date of Birth: 07-Dec-1970

## 2015-04-30 ENCOUNTER — Ambulatory Visit: Payer: Commercial Managed Care - HMO | Admitting: Physical Therapy

## 2015-04-30 DIAGNOSIS — M79662 Pain in left lower leg: Secondary | ICD-10-CM | POA: Diagnosis not present

## 2015-04-30 DIAGNOSIS — M79661 Pain in right lower leg: Secondary | ICD-10-CM | POA: Diagnosis not present

## 2015-04-30 DIAGNOSIS — R269 Unspecified abnormalities of gait and mobility: Secondary | ICD-10-CM

## 2015-04-30 DIAGNOSIS — R6889 Other general symptoms and signs: Secondary | ICD-10-CM

## 2015-04-30 DIAGNOSIS — M6289 Other specified disorders of muscle: Secondary | ICD-10-CM

## 2015-04-30 DIAGNOSIS — G8250 Quadriplegia, unspecified: Secondary | ICD-10-CM | POA: Diagnosis not present

## 2015-04-30 DIAGNOSIS — IMO0001 Reserved for inherently not codable concepts without codable children: Secondary | ICD-10-CM

## 2015-04-30 DIAGNOSIS — M6249 Contracture of muscle, multiple sites: Secondary | ICD-10-CM | POA: Diagnosis not present
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
## 2015-05-02 NOTE — Telephone Encounter (Signed)
lmovm for pt to call and schedule appt. Fleeger, Salome Spotted

## 2015-05-03 ENCOUNTER — Ambulatory Visit: Payer: Commercial Managed Care - HMO

## 2015-05-03 DIAGNOSIS — G8250 Quadriplegia, unspecified: Secondary | ICD-10-CM | POA: Diagnosis not present

## 2015-05-03 DIAGNOSIS — M79661 Pain in right lower leg: Secondary | ICD-10-CM

## 2015-05-03 DIAGNOSIS — R269 Unspecified abnormalities of gait and mobility: Secondary | ICD-10-CM | POA: Diagnosis not present

## 2015-05-03 DIAGNOSIS — M6249 Contracture of muscle, multiple sites: Secondary | ICD-10-CM | POA: Diagnosis not present

## 2015-05-03 DIAGNOSIS — M79662 Pain in left lower leg: Secondary | ICD-10-CM

## 2015-05-03 DIAGNOSIS — R6889 Other general symptoms and signs: Secondary | ICD-10-CM

## 2015-05-03 DIAGNOSIS — M6289 Other specified disorders of muscle: Secondary | ICD-10-CM

## 2015-05-03 NOTE — Therapy (Signed)
[place], [place], [address] Phone: [phone]   Fax:  [phone]  Physical Therapy Treatment  Patient Details  Name: Andrew Sullivan MRN: [id number] Date of Birth: Jul 19, 1970 Referring Provider: Georges Lynch, MD  Encounter Date: 05/03/2015      PT End of Session - 05/03/15 1539    Visit Number 8   Number of Visits 8   Date for PT Re-Evaluation 05/22/15   PT Start Time 0240   PT Stop Time 0345   PT Time Calculation (min) 65 min   Activity Tolerance Patient tolerated treatment well   Behavior During Therapy [facility] for tasks assessed/performed      Past Medical History  Diagnosis Date  . Paraparesis of both lower limbs ([place])     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 61 in 61  . History of recurrent UTIs   . Erectile dysfunction   . Gross hematuria   . History of spinal cord injury     72 (age 33 fell from ladder) w/ cervical spine fx  s/p  c4 -- c6  fusion--  residual paraplegic bilateral legs (great below T8 uses w/c and can transfer self  . Spastic tetraplegia [facility])     Past Surgical History  Procedure Laterality Date  . Cervical fusion  1988    C4 -- C6 w/ spinal cord injury  . Hip arthroplasty  08/12/2011    Procedure: ARTHROPLASTY BIPOLAR HIP;  Surgeon: Mauri Pole, MD;  [facility];  Service: Orthopedics;  Laterality: Left;  Hemi-arthroplasty  . Cystoscopy with retrograde pyelogram, ureteroscopy and stent placement Left 12/27/2013    Procedure: [place], URETEROSCOPY AND STENT PLACEMENT;  Surgeon: Reece Packer, MD;  Location: [place];  Service: Urology;  Laterality: Left;  . Cystoscopy/retrograde/ureteroscopy Left 01/09/2014    Procedure: CYSTOSCOPY/RETROGRADE/URETEROSCOPY, URETERAL BIOPSY AND STENT EXCHANGE;  Surgeon: Alexis Frock, MD;  [facility];  Service: Urology;  Laterality: Left;    There were no vitals filed  for this visit.  Visit Diagnosis:  Muscle hypertonicity - Plan: PT plan of care cert/re-cert  Pain in both lower legs - Plan: PT plan of care cert/re-cert  Activity intolerance - Plan: PT plan of care cert/re-cert  Abnormality of gait - Plan: PT plan of care cert/re-cert      Subjective Assessment - 05/03/15 1537    Subjective No pain .    Currently in Pain? No/denies                         [facility] Adult PT Treatment/Exercise - 05/03/15 0001    Ambulation/Gait   Ambulation Distance (Feet) 200 Feet   Assistive device Rolling walker   Gait velocity .468 ft /sec   Gait Comments He has met goal as he had no pain and reported he could walk farther if needed   Moist Heat Therapy   Number Minutes Moist Heat 15 Minutes   Moist Heat Location --  posterior thighs   Manual Therapy   Soft tissue mobilization Prone soft tissue work (IASTM) using massage roller and rock blade to bilateral hamstrings. Focued distal for rock blade.    Passive ROM hamstrings.  stretching in supine sustained with pessure to opposite leg for hip extension and with ER sttretch with SLR to decr tone.  Prone quadstretch and abduction stretch in prone and supine using bolster to asssit in positional stretching  PT Long Term Goals - 05/03/15 1434    PT LONG TERM GOAL #2   Title reports pain with gait /standing in hamstrings </= 2/10 (Target Date: 05/08/15)   Status Achieved   PT LONG TERM GOAL #4   Title ambulates 200' with RW safely modified independent. (Target Date: 05/08/15) to demo decr pain and function   Status Achieved   PT LONG TERM GOAL #5   Title Gait Velocity >0.5 ft/sec (Target Date: 05/08/15)   Baseline .468 ft/sec 05/03/15   Status Partially Met               Plan - 05/03/15 1540    Clinical Impression Statement He has met all goals except gait velocity. We will see x 2 more then discharge to max benefit from manual treatment. Measure gait  velocity next week   PT Frequency 2x / week   PT Duration --  1 week   PT Next Visit Plan Continue stretching hamstrings and with this hip ER and abduction and opposite hip flexor stretching with this.STW, heat  . Assess goals (gait distnce and gait velocity)    Consulted and Agree with Plan of Care Patient        Problem List Patient Active Problem List   Diagnosis Date Noted  . Constipation 11/17/2014  . Screening for cardiovascular condition 08/04/2014  . Annual physical exam 06/30/2014  . Spastic tetraplegia ([place]) 12/06/2013  . Hematuria 11/05/2013  . Contact dermatitis 05/12/2013  . Rash of hands 05/12/2013  . Fatigue 11/16/2012  . Anemia 11/16/2012  . History of recurrent UTIs 05/19/2010  . Functional quadriplegia ([place]) 05/10/2009  . HEMATURIA 07/29/2007  . Closed fracture of cervical vertebra ([place]) 07/29/2007    [name] PT 05/03/2015, 3:46 PM  [place] [facility] [address] [place], [place], [address] Phone: [phone]   Fax:  [phone]  Name: DERRIOUS BOLOGNA MRN: [id number] Date of Birth: 1971/01/12

## 2015-05-07 ENCOUNTER — Ambulatory Visit: Payer: Commercial Managed Care - HMO | Admitting: Physical Therapy

## 2015-05-07 DIAGNOSIS — R269 Unspecified abnormalities of gait and mobility: Secondary | ICD-10-CM

## 2015-05-07 DIAGNOSIS — G8250 Quadriplegia, unspecified: Secondary | ICD-10-CM | POA: Diagnosis not present

## 2015-05-07 DIAGNOSIS — R6889 Other general symptoms and signs: Secondary | ICD-10-CM | POA: Diagnosis not present

## 2015-05-07 DIAGNOSIS — M79662 Pain in left lower leg: Secondary | ICD-10-CM

## 2015-05-07 DIAGNOSIS — M79661 Pain in right lower leg: Secondary | ICD-10-CM

## 2015-05-07 DIAGNOSIS — M6289 Other specified disorders of muscle: Secondary | ICD-10-CM

## 2015-05-07 DIAGNOSIS — IMO0001 Reserved for inherently not codable concepts without codable children: Secondary | ICD-10-CM

## 2015-05-07 DIAGNOSIS — M6249 Contracture of muscle, multiple sites: Secondary | ICD-10-CM | POA: Diagnosis not present

## 2015-05-07 NOTE — Therapy (Signed)
[place], [place], [address] Phone: [phone]   Fax:  [phone]  Physical Therapy Treatment  Patient Details  Name: Andrew Sullivan MRN: [id number] Date of Birth: 1971-02-16 Referring Provider: Georges Lynch, MD  Encounter Date: 05/07/2015      PT End of Session - 05/07/15 1641    Visit Number 9   Number of Visits 10   Date for PT Re-Evaluation 05/22/15   Authorization Type Medicare   PT Start Time 0300   PT Stop Time 0400   PT Time Calculation (min) 60 min      Past Medical History  Diagnosis Date  . Paraparesis of both lower limbs ([place])     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 52 in 32  . History of recurrent UTIs   . Erectile dysfunction   . Gross hematuria   . History of spinal cord injury     75 (age 78 fell from ladder) w/ cervical spine fx  s/p  c4 -- c6  fusion--  residual paraplegic bilateral legs (great below T8 uses w/c and can transfer self  . Spastic tetraplegia [facility])     Past Surgical History  Procedure Laterality Date  . Cervical fusion  1988    C4 -- C6 w/ spinal cord injury  . Hip arthroplasty  08/12/2011    Procedure: ARTHROPLASTY BIPOLAR HIP;  Surgeon: Mauri Pole, MD;  [facility];  Service: Orthopedics;  Laterality: Left;  Hemi-arthroplasty  . Cystoscopy with retrograde pyelogram, ureteroscopy and stent placement Left 12/27/2013    Procedure: [place], URETEROSCOPY AND STENT PLACEMENT;  Surgeon: Reece Packer, MD;  Location: [place];  Service: Urology;  Laterality: Left;  . Cystoscopy/retrograde/ureteroscopy Left 01/09/2014    Procedure: CYSTOSCOPY/RETROGRADE/URETEROSCOPY, URETERAL BIOPSY AND STENT EXCHANGE;  Surgeon: Alexis Frock, MD;  [facility];  Service: Urology;  Laterality: Left;    There were no vitals filed for this visit.  Visit Diagnosis:  Muscle hypertonicity  Pain in both lower  legs  Activity intolerance  Abnormality of gait  Quadriparesis (muscle weakness) (HCC)                       [facility] Adult PT Treatment/Exercise - 05/07/15 0001    Moist Heat Therapy   Number Minutes Moist Heat 15 Minutes   Moist Heat Location --  posterior thighs   Manual Therapy   Soft tissue mobilization Prone soft tissue work (IASTM) using massage roller and rock blade to bilateral hamstrings. Focued distal for rock blade.    Passive ROM hamstrings.  stretching in supine sustained with pessure to opposite leg for hip extension and with ER sttretch with SLR to decr tone.  Prone quadstretch and abduction stretch in prone and supine using bolster to asssit in positional stretching                       PT Long Term Goals - 05/03/15 1434    PT LONG TERM GOAL #2   Title reports pain with gait /standing in hamstrings </= 2/10 (Target Date: 05/08/15)   Status Achieved   PT LONG TERM GOAL #4   Title ambulates 200' with RW safely modified independent. (Target Date: 05/08/15) to demo decr pain and function   Status Achieved   PT LONG TERM GOAL #5   Title Gait Velocity >0.5 ft/sec (Target Date: 05/08/15)   Baseline .468 ft/sec 05/03/15  Status Partially Met               Plan - 05/07/15 1547    Clinical Impression Statement Pt reports he may need transportation services due to car trouble. Pt given [place] number for medicaid transportation. Manual stretching and soft tisuue work followed by heat. No pain per patient.    PT Next Visit Plan Continue stretching hamstrings and with this hip ER and abduction and opposite hip flexor stretching with this.STW, heat  . Discharge next visit FOTO?        Problem List Patient Active Problem List   Diagnosis Date Noted  . Constipation 11/17/2014  . Screening for cardiovascular condition 08/04/2014  . Annual physical exam 06/30/2014  . Spastic tetraplegia ([place]) 12/06/2013  . Hematuria 11/05/2013  . Contact  dermatitis 05/12/2013  . Rash of hands 05/12/2013  . Fatigue 11/16/2012  . Anemia 11/16/2012  . History of recurrent UTIs 05/19/2010  . Functional quadriplegia ([place]) 05/10/2009  . HEMATURIA 07/29/2007  . Closed fracture of cervical vertebra [facility]) 07/29/2007    Dorene Ar, PTA 05/07/2015, 4:43 PM  [place], [place], [address] Phone: [phone]   Fax:  [phone]  Name: TAELOR MONCADA MRN: [id number] Date of Birth: [birth year]

## 2015-05-09 ENCOUNTER — Ambulatory Visit: Payer: Commercial Managed Care - HMO | Attending: Family Medicine | Admitting: Physical Therapy

## 2015-05-09 DIAGNOSIS — R269 Unspecified abnormalities of gait and mobility: Secondary | ICD-10-CM

## 2015-05-09 DIAGNOSIS — M79662 Pain in left lower leg: Secondary | ICD-10-CM | POA: Diagnosis not present

## 2015-05-09 DIAGNOSIS — G8250 Quadriplegia, unspecified: Secondary | ICD-10-CM | POA: Insufficient documentation

## 2015-05-09 DIAGNOSIS — M6249 Contracture of muscle, multiple sites: Secondary | ICD-10-CM | POA: Diagnosis not present

## 2015-05-09 DIAGNOSIS — R6889 Other general symptoms and signs: Secondary | ICD-10-CM | POA: Diagnosis not present

## 2015-05-09 DIAGNOSIS — M79661 Pain in right lower leg: Secondary | ICD-10-CM | POA: Insufficient documentation

## 2015-05-09 DIAGNOSIS — M6289 Other specified disorders of muscle: Secondary | ICD-10-CM

## 2015-05-09 DIAGNOSIS — IMO0001 Reserved for inherently not codable concepts without codable children: Secondary | ICD-10-CM

## 2015-05-10 NOTE — Therapy (Addendum)
[place], [place], [address] Phone: [phone]   Fax:  [phone]  Physical Therapy Treatment  Patient Details  Name: Andrew Sullivan MRN: [id number] Date of Birth: [birth year] Referring Provider: Georges Lynch, MD  Encounter Date: 05/09/2015      PT End of Session - 05/09/15 0841    Visit Number 10   Number of Visits 10   Date for PT Re-Evaluation 05/22/15   Authorization Type Medicare   PT Start Time 0205   PT Stop Time 0310   PT Time Calculation (min) 65 min      Past Medical History  Diagnosis Date  . Paraparesis of both lower limbs (Andrew Sullivan)     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 46 in 103  . History of recurrent UTIs   . Erectile dysfunction   . Gross hematuria   . History of spinal cord injury     28 (age 68 fell from ladder) w/ cervical spine fx  s/p  c4 -- c6  fusion--  residual paraplegic bilateral legs (great below T8 uses w/c and can transfer self  . Spastic tetraplegia [facility])     Past Surgical History  Procedure Laterality Date  . Cervical fusion  1988    C4 -- C6 w/ spinal cord injury  . Hip arthroplasty  08/12/2011    Procedure: ARTHROPLASTY BIPOLAR HIP;  Surgeon: Mauri Pole, MD;  [facility];  Service: Orthopedics;  Laterality: Left;  Hemi-arthroplasty  . Cystoscopy with retrograde pyelogram, ureteroscopy and stent placement Left 12/27/2013    Procedure: [name], URETEROSCOPY AND STENT PLACEMENT;  Surgeon: Reece Packer, MD;  Location: [place];  Service: Urology;  Laterality: Left;  . Cystoscopy/retrograde/ureteroscopy Left 01/09/2014    Procedure: CYSTOSCOPY/RETROGRADE/URETEROSCOPY, URETERAL BIOPSY AND STENT EXCHANGE;  Surgeon: Alexis Frock, MD;  [facility];  Service: Urology;  Laterality: Left;    There were no vitals filed for this visit.  Visit Diagnosis:  Muscle hypertonicity  Pain in both lower  legs  Activity intolerance  Abnormality of gait  Quadriparesis (muscle weakness) (HCC)      Subjective Assessment - 05/10/15 0841    Subjective No pain. No changes   Currently in Pain? No/denies                         [facility] Adult PT Treatment/Exercise - 05/10/15 0001    Ambulation/Gait   Ambulation Distance (Feet) 200 Feet   Assistive device Rolling walker   Gait velocity .468 ft /sec   Gait Comments He has met goal as he had no pain and reported he could walk farther if needed   Moist Heat Therapy   Number Minutes Moist Heat 15 Minutes   Moist Heat Location --  posterior thighs   Manual Therapy   Soft tissue mobilization Prone soft tissue work (IASTM) using massage roller and rock blade to bilateral hamstrings. Focued distal for rock blade.    Passive ROM hamstrings.  stretching in supine sustained with pessure to opposite leg for hip extension and with ER sttretch with SLR to decr tone.  Prone quadstretch and abduction stretch in prone and supine using bolster to asssit in positional stretching , also side lying left hip flexor stretch                      PT Long Term Goals - 05/10/15 7619  PT LONG TERM GOAL #2   Title reports pain with gait /standing in hamstrings </= 2/10 (Target Date: 05/08/15)   Time 4   Period Weeks   Status Achieved   PT LONG TERM GOAL #4   Title ambulates 200' with RW safely modified independent. (Target Date: 05/08/15) to demo decr pain and function   Status Achieved   PT LONG TERM GOAL #5   Title Gait Velocity >0.5 ft/sec (Target Date: 05/08/15)   Baseline .468 ft/sec 05/03/15   Status Partially Met      G-Code:         Clinical judgement   Mobility: Goal CK    Discharge status:  CK         Plan - 05/09/15 0842    Clinical Impression Statement All goals met except gait velocity.    PT Next Visit Plan Discharge this visit due to max benefit from manual treatment.         Problem List Patient Active  Problem List   Diagnosis Date Noted  . Constipation 11/17/2014  . Screening for cardiovascular condition 08/04/2014  . Annual physical exam 06/30/2014  . Spastic tetraplegia ([place]) 12/06/2013  . Hematuria 11/05/2013  . Contact dermatitis 05/12/2013  . Rash of hands 05/12/2013  . Fatigue 11/16/2012  . Anemia 11/16/2012  . History of recurrent UTIs 05/19/2010  . Functional quadriplegia ([place]) 05/10/2009  . HEMATURIA 07/29/2007  . Closed fracture of cervical vertebra ([place]) 07/29/2007    Dorene Ar, PTA 05/10/2015, 8:49 AM  [place], [place], [address] Phone: [phone]   Fax:  [phone]  Name: Andrew Sullivan MRN: [id number] Date of Birth: [birth year]    PHYSICAL THERAPY DISCHARGE SUMMARY  Visits from Start of Care: 8  Current functional level related to goals / functional outcomes: See above   Remaining deficits: Gait speed was not met   Education / Equipment: Asked to stretch at home as able Plan: Patient agrees to discharge.  Patient goals were met. Patient is being discharged due to meeting the stated rehab goals.  ?????   Lillette Boxer Chasse  PT    05/14/15      11:16 AM
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
## 2015-08-02 ENCOUNTER — Ambulatory Visit (INDEPENDENT_AMBULATORY_CARE_PROVIDER_SITE_OTHER): Payer: Commercial Managed Care - HMO | Admitting: Family Medicine

## 2015-08-02 ENCOUNTER — Encounter: Payer: Self-pay | Admitting: Family Medicine

## 2015-08-02 VITALS — BP 132/68 | HR 66 | Temp 98.1°F | Wt 170.0 lb

## 2015-08-02 DIAGNOSIS — L259 Unspecified contact dermatitis, unspecified cause: Secondary | ICD-10-CM | POA: Diagnosis not present

## 2015-08-02 DIAGNOSIS — R21 Rash and other nonspecific skin eruption: Secondary | ICD-10-CM

## 2015-08-02 DIAGNOSIS — R532 Functional quadriplegia: Secondary | ICD-10-CM | POA: Diagnosis not present

## 2015-08-02 MED ORDER — BACLOFEN 20 MG PO TABS: 20.0000 mg | ORAL_TABLET | Freq: Four times a day (QID) | ORAL | Status: DC

## 2015-08-02 MED ORDER — HYDROCORTISONE 1 % EX OINT: 1.0000 "application " | TOPICAL_OINTMENT | Freq: Two times a day (BID) | CUTANEOUS | Status: DC

## 2015-08-02 NOTE — Patient Instructions (Signed)
Rash Treatment - you should: - Apply the hydrocortisone ointment twice a day to the rash. - Actively avoid using that new soap from this point forward. You should be better in: 3-5 days Call us or go to the ER if you have high fever, sores in your mouth, feel very ill or the rash is a lot worse. Come back to see [place] in 1-2 weeks if your rash is not fully resolved.

## 2015-08-02 NOTE — Progress Notes (Signed)
RASH Patient reports a 5 day history of an abdominal rash. He states that he used a new soap to bathe with this week and he noticed the rash immediately after the first use. Since that time he has discontinued the use of the soap but the rash is not fully resolved. He is wondering if there is anything he can take or use in order to fully resolve this rash. Rash is currently mild papillary/macular in nature and covers the majority of his lower quadrants of his abdomen. Rash is pruritic. Rash does not extend into his groin or above his nipples. Rash is not painful.  Had rash for 5 days. Location: Lower abdomen Medications tried: None Similar rash in past: No New medications or antibiotics: No Tick, Insect or new pet exposure: No Recent travel: No New detergent or soap: Yes Immunocompromised: No  Symptoms Itching: Yes Pain over rash: No Feeling ill all over: No Fever: No Mouth sores: No Face or tongue swelling: No Trouble breathing: No Joint swelling or pain: No  Review of Symptoms - see HPI PMH - Smoking status noted.    CC, SH/smoking status, and VS noted  Objective: BP 132/68 mmHg  Pulse 66  Temp(Src) 98.1 F (36.7 C) (Oral)  Wt 170 lb (77.111 kg)  SpO2 99% Gen: NAD, alert, cooperative, and pleasant. Integument: Very mild macular rash with small faintly elevated papules. Rash located to the mid and lower abdomen bilaterally. Some evidence of excoriations but no pustules or vesicles. Abd: SNTND, BS present, no guarding or organomegaly Neuro: Alert and oriented, Speech clear, spastic tetraplagia at baseline  Assessment and plan:  Contact dermatitis Patient reports new onset of rash most consistent with contact dermatitis secondary to new soap use. Rash has significantly improved since discontinuing this soap however it has persisted to a mild degree. - Advised to completely discontinue all use of this soap and to either go back to his previous soap Brand and type. -  Hydrocortisone ointment provided. Apply twice daily for 1 week or until full resolution of the rash. - Follow-up in 2 weeks if rash is not resolved.    Meds ordered this encounter  Medications  . baclofen (LIORESAL) 20 MG tablet    Sig: Take 1 tablet (20 mg total) by mouth 4 (four) times daily.    Dispense:  120 tablet    Refill:  5  . hydrocortisone 1 % ointment    Sig: Apply 1 application topically 2 (two) times daily.    Dispense:  30 g    Refill:  0     Elberta Leatherwood, MD,MS,  PGY2 08/02/2015 8:48 PM

## 2015-08-02 NOTE — Assessment & Plan Note (Signed)
Patient reports new onset of rash most consistent with contact dermatitis secondary to new soap use. Rash has significantly improved since discontinuing this soap however it has persisted to a mild degree. - Advised to completely discontinue all use of this soap and to either go back to his previous soap Brand and type. - Hydrocortisone ointment provided. Apply twice daily for 1 week or until full resolution of the rash. - Follow-up in 2 weeks if rash is not resolved.

## 2015-08-15 ENCOUNTER — Telehealth: Payer: Self-pay | Admitting: Family Medicine

## 2015-08-15 NOTE — Telephone Encounter (Signed)
Pt called and needs a referral to Urology. He has an appointment tomorrow. jw

## 2015-08-16 ENCOUNTER — Other Ambulatory Visit: Payer: Self-pay | Admitting: Family Medicine

## 2015-08-16 DIAGNOSIS — N319 Neuromuscular dysfunction of bladder, unspecified: Secondary | ICD-10-CM | POA: Diagnosis not present

## 2015-08-16 DIAGNOSIS — Z87440 Personal history of urinary (tract) infections: Secondary | ICD-10-CM

## 2015-08-16 DIAGNOSIS — N281 Cyst of kidney, acquired: Secondary | ICD-10-CM | POA: Diagnosis not present

## 2015-08-16 NOTE — Telephone Encounter (Signed)
Referral sent 

## 2015-10-04 ENCOUNTER — Encounter: Payer: Self-pay | Admitting: Family Medicine

## 2015-10-04 ENCOUNTER — Ambulatory Visit (INDEPENDENT_AMBULATORY_CARE_PROVIDER_SITE_OTHER): Payer: Commercial Managed Care - HMO | Admitting: Family Medicine

## 2015-10-04 DIAGNOSIS — G8250 Quadriplegia, unspecified: Secondary | ICD-10-CM

## 2015-10-04 DIAGNOSIS — L723 Sebaceous cyst: Secondary | ICD-10-CM

## 2015-10-04 NOTE — Assessment & Plan Note (Signed)
Stable: Continues to have painful muscle spasms in his legs but he states that this is at its baseline and fairly well controlled. He continues to stay active as best he can. - No medication changes at this time.

## 2015-10-04 NOTE — Progress Notes (Signed)
   HPI  CC: Follow-up and neck mass Patient is here for a follow-up. He states he is doing well and has no significant complaints at this time. Medications are working well to control some of his pain and discomfort. He continues to stay active and has no significant concerns at this time.  Right-sided neck mass: Patient is complaining of a neck mass noted on the right posterior lateral aspect of the neck. Mass is been present for years and does not cause any pain. He states that it is very noticeable and he would like it removed if it is possible. No recent drainage, pain, inflammation, injury. He denies any fever, chills, nausea, vomiting, diarrhea, shortness of breath, dysphasia.  Review of Systems   See HPI for ROS. All other systems reviewed and are negative.  CC, SH/smoking status, and VS noted  Objective: BP 114/70 (BP Location: Right Arm, Patient Position: Sitting, Cuff Size: Normal)   Pulse 70   Temp 98.3 F (36.8 C) (Oral)   Ht 6\' 2"  (1.88 m)   Wt 166 lb 6.4 oz (75.5 kg)   BMI 21.36 kg/m  Gen: NAD, alert, cooperative, and pleasant. Integument:  3cm x 2cm solid subcutaneous mass on the posterior lateral aspect of the right neck. Mass is very mobile and appears to have a centralized darkened head. No evidence of drainage CV: RRR  Chest : Normal work of breathing  Abd: SNTND, BS present, no guarding or organomegaly Neuro: Alert and oriented, Speech clear, spastic tetraplagia at baseline  Assessment and plan:  Spastic tetraplegia Stable: Continues to have painful muscle spasms in his legs but he states that this is at its baseline and fairly well controlled. He continues to stay active as best he can. - No medication changes at this time.  Sebaceous cyst Chin complaining of mass on right side of neck. Etiology likely sebaceous cyst. Patient would like to have it removed. I've asked that he make an appointment to have this done in our office at his convenience. - Follow-up  as needed   Elberta Leatherwood, MD,MS,  PGY3 10/04/2015 7:10 PM

## 2015-10-04 NOTE — Assessment & Plan Note (Signed)
Chin complaining of mass on right side of neck. Etiology likely sebaceous cyst. Patient would like to have it removed. I've asked that he make an appointment to have this done in our office at his convenience. - Follow-up as needed

## 2015-10-18 ENCOUNTER — Ambulatory Visit (INDEPENDENT_AMBULATORY_CARE_PROVIDER_SITE_OTHER): Payer: Commercial Managed Care - HMO | Admitting: Family Medicine

## 2015-10-18 ENCOUNTER — Encounter: Payer: Self-pay | Admitting: Family Medicine

## 2015-10-18 DIAGNOSIS — L723 Sebaceous cyst: Secondary | ICD-10-CM

## 2015-10-18 NOTE — Patient Instructions (Signed)
Facial Laceration ° A facial laceration is a cut on the face. These injuries can be painful and cause bleeding. Lacerations usually heal quickly, but they need special care to reduce scarring. °DIAGNOSIS  °Your health care provider will take a medical history, ask for details about how the injury occurred, and examine the wound to determine how deep the cut is. °TREATMENT  °Some facial lacerations may not require closure. Others may not be able to be closed because of an increased risk of infection. The risk of infection and the chance for successful closure will depend on various factors, including the amount of time since the injury occurred. °The wound may be cleaned to help prevent infection. If closure is appropriate, pain medicines may be given if needed. Your health care provider will use stitches (sutures), wound glue (adhesive), or skin adhesive strips to repair the laceration. These tools bring the skin edges together to allow for faster healing and a better cosmetic outcome. If needed, you may also be given a tetanus shot. °HOME CARE INSTRUCTIONS °· Only take over-the-counter or prescription medicines as directed by your health care provider. °· Follow your health care provider's instructions for wound care. These instructions will vary depending on the technique used for closing the wound. °For Sutures: °· Keep the wound clean and dry.   °· If you were given a bandage (dressing), you should change it at least once a day. Also change the dressing if it becomes wet or dirty, or as directed by your health care provider.   °· Wash the wound with soap and water 2 times a day. Rinse the wound off with water to remove all soap. Pat the wound dry with a clean towel.   °· After cleaning, apply a thin layer of the antibiotic ointment recommended by your health care provider. This will help prevent infection and keep the dressing from sticking.   °· You may shower as usual after the first 24 hours. Do not soak the  wound in water until the sutures are removed.   °· Get your sutures removed as directed by your health care provider. With facial lacerations, sutures should usually be taken out after 4-5 days to avoid stitch marks.   °· Wait a few days after your sutures are removed before applying any makeup. °For Skin Adhesive Strips: °· Keep the wound clean and dry.   °· Do not get the skin adhesive strips wet. You may bathe carefully, using caution to keep the wound dry.   °· If the wound gets wet, pat it dry with a clean towel.   °· Skin adhesive strips will fall off on their own. You may trim the strips as the wound heals. Do not remove skin adhesive strips that are still stuck to the wound. They will fall off in time.   °For Wound Adhesive: °· You may briefly wet your wound in the shower or bath. Do not soak or scrub the wound. Do not swim. Avoid periods of heavy sweating until the skin adhesive has fallen off on its own. After showering or bathing, gently pat the wound dry with a clean towel.   °· Do not apply liquid medicine, cream medicine, ointment medicine, or makeup to your wound while the skin adhesive is in place. This may loosen the film before your wound is healed.   °· If a dressing is placed over the wound, be careful not to apply tape directly over the skin adhesive. This may cause the adhesive to be pulled off before the wound is healed.   °· Avoid   prolonged exposure to sunlight or tanning lamps while the skin adhesive is in place. °· The skin adhesive will usually remain in place for 5-10 days, then naturally fall off the skin. Do not pick at the adhesive film.   °After Healing: °Once the wound has healed, cover the wound with sunscreen during the day for 1 full year. This can help minimize scarring. Exposure to ultraviolet light in the first year will darken the scar. It can take 1-2 years for the scar to lose its redness and to heal completely.  °SEEK MEDICAL CARE IF: °· You have a fever. °SEEK IMMEDIATE  MEDICAL CARE IF: °· You have redness, pain, or swelling around the wound.   °· You see a yellowish-white fluid (pus) coming from the wound.   °  °This information is not intended to replace advice given to you by your health care provider. Make sure you discuss any questions you have with your health care provider. °  °Document Released: 04/03/2004 Document Revised: 03/17/2014 Document Reviewed: 10/07/2012 °Elsevier Interactive Patient Education ©2016 [employer] ° °

## 2015-10-18 NOTE — Progress Notes (Signed)
   HPI  CC: Sebaceous cysts, right neck Patient is here for a sebaceous cyst removal. He states that this cyst is very bothersome and occasionally tender. He denies any drainage or irritation of this area at this time. He doesn't have significant pain but he states it is very noticeable and bothersome on most days. He denies any localized erythema or ecchymoses.  Review of Systems   See HPI for ROS. All other systems reviewed and are negative.  CC, SH/smoking status, and VS noted  Objective: BP 117/81   Pulse 77   Temp 98.1 F (36.7 C) (Oral)  Gen: NAD, alert, cooperative. CV: Well-perfused. Resp: Non-labored. Neuro: Chronic spastic tetraplegia at baseline Neck: Full ROM, firm mobile 2 x 2 centimeter cutaneous mass noted on the right lateral base of the neck. Small darkened central pore present. No surrounding erythema or induration.   Excision: Sebaceous cyst, right neck A timeout protocol was performed prior to initiating the procedure. The area was prepared and draped in the usual, sterile manner. The site was anesthetized with 64ml of 1% lidocaine with epinephrine. A linear incision along the local skin lines was made. Cyst capsule observed and punctured with approximately 3 mL thick dark gray matter expressed. Cyst capsule was then removed in its entirety. Bleeding was minimal. 2 interrupted sutures of 3-0 nylon were then used to close the incision. Site was cleaned and dressed. The patient tolerated the procedure well without complications. Standard post-procedure care is explained and return precautions are given.   Assessment and plan:  Sebaceous cyst Patient underwent sebaceous cyst removal. Tolerated procedure well. Very minimal bleeding was observed. - Proper postprocedure care was discussed. - Patient is not to submerge site under water for 72 hours (at least) - Patient to return for suture removal on Monday/Tuesday of next week.  Of note: If site shows slowed healing  and would consider mild packing as there was a small area of space beneath the incisional site which appeared small enough to heal without packing but was large enough to keep in the back of our mind.  [Desiree present for the entire procedure.]   Elberta Leatherwood, MD,MS,  PGY3 10/18/2015 6:49 PM

## 2015-10-18 NOTE — Assessment & Plan Note (Signed)
Patient underwent sebaceous cyst removal. Tolerated procedure well. Very minimal bleeding was observed. - Proper postprocedure care was discussed. - Patient is not to submerge site under water for 72 hours (at least) - Patient to return for suture removal on Monday/Tuesday of next week.  Of note: If site shows slowed healing and would consider mild packing as there was a small area of space beneath the incisional site which appeared small enough to heal without packing but was large enough to keep in the back of our mind.  [Desiree present for the entire procedure.]

## 2015-10-23 ENCOUNTER — Ambulatory Visit (INDEPENDENT_AMBULATORY_CARE_PROVIDER_SITE_OTHER): Payer: Commercial Managed Care - HMO | Admitting: *Deleted

## 2015-10-23 DIAGNOSIS — Z5189 Encounter for other specified aftercare: Secondary | ICD-10-CM

## 2015-10-23 DIAGNOSIS — Z4802 Encounter for removal of sutures: Secondary | ICD-10-CM

## 2015-10-23 NOTE — Progress Notes (Signed)
   Patient in nurse clinic today for suture removal from right side of neck.  Sutures placed on 10/18/15 due to Sebaceous cyst.  Patient had two sutures placed and was told to have them removed on Monday or Tuesday of this week (10/22/2015).  No signs of infection.  Precept with Dr. Ree Kida; remove one suture to make sure the wound is healing/closed.  Area cleaned with betadine and alcohol swab.  One suture removed without difficultly; area still slightly open.  One suture remain in place.  One small steri-strip placed on open area.  Patient to return on Friday 10/26/15 for wound check and remove remainder suture.  Derl Barrow, RN

## 2015-10-26 ENCOUNTER — Ambulatory Visit (INDEPENDENT_AMBULATORY_CARE_PROVIDER_SITE_OTHER): Payer: Commercial Managed Care - HMO | Admitting: *Deleted

## 2015-10-26 DIAGNOSIS — Z5189 Encounter for other specified aftercare: Secondary | ICD-10-CM

## 2015-10-26 DIAGNOSIS — Z4802 Encounter for removal of sutures: Secondary | ICD-10-CM

## 2015-10-26 NOTE — Progress Notes (Addendum)
Patient here today to recheck neck incision and have one suture removed.  Neck has one suture and one steristrip.  No drainage, redness, or odor.  Precepted with Dr. Sherlon Handing was cleaned and one suture removed without difficulty.  Applied benzoin and 2 steristrips to area.  Informed to monitor area for signs of infection (redness, pain/tenderness, swelling, drainage, or odor).  Patient verbalized understanding.  Burna Forts, BSN, RN-BC

## 2016-03-14 ENCOUNTER — Other Ambulatory Visit: Payer: Self-pay | Admitting: Family Medicine

## 2016-03-14 DIAGNOSIS — R532 Functional quadriplegia: Secondary | ICD-10-CM

## 2016-08-07 ENCOUNTER — Encounter: Payer: Self-pay | Admitting: *Deleted

## 2016-08-07 ENCOUNTER — Ambulatory Visit (INDEPENDENT_AMBULATORY_CARE_PROVIDER_SITE_OTHER): Payer: Medicare HMO | Admitting: *Deleted

## 2016-08-07 VITALS — BP 106/56 | HR 70 | Temp 98.4°F | Ht 74.0 in | Wt 169.0 lb

## 2016-08-07 DIAGNOSIS — D649 Anemia, unspecified: Secondary | ICD-10-CM

## 2016-08-07 DIAGNOSIS — R5383 Other fatigue: Secondary | ICD-10-CM | POA: Diagnosis not present

## 2016-08-07 DIAGNOSIS — Z0000 Encounter for general adult medical examination without abnormal findings: Secondary | ICD-10-CM

## 2016-08-07 DIAGNOSIS — Z114 Encounter for screening for human immunodeficiency virus [HIV]: Secondary | ICD-10-CM | POA: Diagnosis not present

## 2016-08-07 NOTE — Progress Notes (Signed)
Subjective:   Andrew Sullivan is a 46 y.o. male who presents via wheelchair for an Initial Medicare Annual Wellness Visit.  Cardiac Risk Factors include: male gender    Objective:    Today's Vitals   08/07/16 1117  BP: (!) 106/56  Pulse: 70  Temp: 98.4 F (36.9 C)  TempSrc: Oral  SpO2: 96%  Weight: 169 lb (76.7 kg)  Height: 6\' 2"  (1.88 m)  PainSc: 6    Body mass index is 21.7 kg/m.  Current Medications (verified) Outpatient Encounter Prescriptions as of 08/07/2016  Medication Sig  . baclofen (LIORESAL) 20 MG tablet Take 1 tablet (20 mg total) by mouth 4 (four) times daily.  . carbamazepine (TEGRETOL) 200 MG tablet Take 1 tablet (200 mg total) by mouth 2 (two) times daily. (Patient not taking: Reported on 04/10/2015)  . docusate sodium (COLACE) 100 MG capsule Take 1 capsule (100 mg total) by mouth 2 (two) times daily as needed for mild constipation. (Patient not taking: Reported on 04/10/2015)  . [DISCONTINUED] baclofen (LIORESAL) 20 MG tablet TAKE ONE TABLET BY MOUTH 4 TIMES DAILY   No facility-administered encounter medications on file as of 08/07/2016.     Allergies (verified) Penicillins   History: Past Medical History:  Diagnosis Date  . Erectile dysfunction   . Gross hematuria   . History of recurrent UTIs   . History of spinal cord injury    73 (age 74 fell from ladder) w/ cervical spine fx  s/p  c4 -- c6  fusion--  residual paraplegic bilateral legs (great below T8 uses w/c and can transfer self  . Paraparesis of both lower limbs ([place])    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 69 in 23  . Spastic tetraplegia ([place])    Past Surgical History:  Procedure Laterality Date  . CERVICAL FUSION  1988   C4 -- C6 w/ spinal cord injury  . CYSTOSCOPY WITH RETROGRADE PYELOGRAM, URETEROSCOPY AND STENT PLACEMENT Left 12/27/2013   Procedure: CYSTOSCOPY WITH RETROGRADE PYELOGRAM, URETEROSCOPY AND STENT PLACEMENT;  Surgeon: Reece Packer, MD;  Location: [facility];  Service: Urology;  Laterality: Left;  . CYSTOSCOPY/RETROGRADE/URETEROSCOPY Left 01/09/2014   Procedure: CYSTOSCOPY/RETROGRADE/URETEROSCOPY, URETERAL BIOPSY AND STENT EXCHANGE;  Surgeon: Alexis Frock, MD;  Location: [facility];  Service: Urology;  Laterality: Left;  . HIP ARTHROPLASTY  08/12/2011   Procedure: ARTHROPLASTY BIPOLAR HIP;  Surgeon: Mauri Pole, MD;  Location: [facility];  Service: Orthopedics;  Laterality: Left;  Hemi-arthroplasty   Family History  Problem Relation Age of Onset  . Arthritis Mother   . Hypertension Mother    Social History   Occupational History  . Not on file.   Social History Main Topics  . Smoking status: Former Smoker    Packs/day: 0.25    Years: 0.50    Types: Cigarettes    Quit date: 12/27/1986  . Smokeless tobacco: Never Used  . Alcohol use No  . Drug use: No  . Sexual activity: Yes   Tobacco Counseling Patient is former smoker with no plans to restart  Activities of Daily Living In your present state of health, do you have any difficulty performing the following activities: 08/07/2016  Hearing? N  Vision? N  Difficulty concentrating or making decisions? N  Walking or climbing stairs? N  Dressing or bathing? N  Doing errands, shopping? N  Preparing Food and eating ? N  Using the Toilet? N  In the past six months, have you accidently leaked urine?  N  Do you have problems with loss of bowel control? N  Managing your Medications? N  Managing your Finances? N  Housekeeping or managing your Housekeeping? N  Some recent data might be hidden   Home Safety:  My home has a working smoke alarm:  Yes X 5           My home throw rugs have been fastened down to the floor or removed:  Wall to wall carpeting only I have a non-slip surface or non-slip mats in the bathtub and shower:  Yes and showerchair        All my home's stairs have handrails, including any outdoor stairs:  One level home with no outside steps          My  home's floors, stairs and hallways are free from clutter, wires and cords:  Yes     I have animals in my home  No I wear seatbelts consistently:  Yes   Patient lives with mother and step-father  Immunizations and Health Maintenance Immunization History  Administered Date(s) Administered  . Td 02/07/2001   Health Maintenance Due  Topic Date Due  . HIV Screening  08/09/1985  . TETANUS/TDAP  02/08/2011    Patient Care Team: McKeag, Marylynn Pearson, MD as PCP - General (Family Medicine)  Indicate any recent Medical Services you may have received from other than [facility] providers in the past year (date may be approximate).    Assessment:   This is a routine wellness examination for Andrew Sullivan.   Hearing/Vision screen  Hearing Screening   125Hz  250Hz  500Hz  1000Hz  2000Hz  3000Hz  4000Hz  6000Hz  8000Hz   Right ear:   Fail Fail 40  40    Left ear:   40 40 40  40      Dietary issues and exercise activities discussed: Current Exercise Habits: Structured exercise class ([employer]), Type of exercise: strength training/weights (leg press arm press and back press), Time (Minutes): 45, Frequency (Times/Week): 3, Weekly Exercise (Minutes/Week): 135, Intensity: Mild, Exercise limited by: orthopedic condition(s)  Goals    . Exercise 3x per week (45 min per time)          Maintain this regimen    . Prevent Falls      Depression Screen PHQ 2/9 Scores 08/07/2016 10/04/2015 08/02/2015 11/17/2014  PHQ - 2 Score 0 0 0 0    Fall Risk Fall Risk  08/07/2016 10/04/2015 11/17/2014 06/30/2014  Falls in the past year? Yes No No No  Number falls in past yr: 2 or more - - -  Injury with Fall? Yes - - -  Risk Factor Category  High Fall Risk - - -  Risk for fall due to : History of fall(s);Impaired balance/gait;Impaired mobility - - Impaired mobility  Follow up Falls evaluation completed;Education provided;Falls prevention discussed - - -   TUG Test:  Unable to perform as patient is in wheelchair. Falls prevention  discussed in detail and literature given. PCP notified of High Fall Risk status  Cognitive Function: Mini-Cog  Failed with score 2/5   Screening Tests Health Maintenance  Topic Date Due  . HIV Screening  08/09/1985  . TETANUS/TDAP  02/08/2011  . INFLUENZA VACCINE  10/08/2016  HIV drawn today as well as additional labs ordered by PCP as patient has not had blood work drawn in 2-3 years. Patient will obtain TDaP at local pharmacy Discussed benefits of flu vaccine yearly beginning Sept 1     Plan:     I  have personally reviewed and noted the following in the patient's chart:   . Medical and social history . Use of alcohol, tobacco or illicit drugs  . Current medications and supplements . Functional ability and status . Nutritional status . Physical activity . Advanced directives . List of other physicians . Hospitalizations, surgeries, and ER visits in previous 12 months . Vitals . Screenings to include cognitive, depression, and falls . Referrals and appointments  In addition, I have reviewed and discussed with patient certain preventive protocols, quality metrics, and best practice recommendations. A written personalized care plan for preventive services as well as general preventive health recommendations were provided to patient.     Velora Heckler, RN   08/07/2016

## 2016-08-07 NOTE — Patient Instructions (Addendum)
Andrew Sullivan, Thank you for taking time to come for yourMedicare Wellness Visit. I appreciate your ongoing commitment to your health goals. Please review the following plan we discussed and let me know if I can assist you in the future.   These are the goals we discussed:  Goals    . Exercise 3x per week (45 min per time)          Maintain this regimen    . Prevent Falls         Fall Prevention in the Home Falls can cause injuries. They can happen to people of all ages. There are many things you can do to make your home safe and to help prevent falls. What can I do on the outside of my home?  Regularly fix the edges of walkways and driveways and fix any cracks.  Remove anything that might make you trip as you walk through a door, such as a raised step or threshold.  Trim any bushes or trees on the path to your home.  Use bright outdoor lighting.  Clear any walking paths of anything that might make someone trip, such as rocks or tools.  Regularly check to see if handrails are loose or broken. Make sure that both sides of any steps have handrails.  Any raised decks and porches should have guardrails on the edges.  Have any leaves, snow, or ice cleared regularly.  Use sand or salt on walking paths during winter.  Clean up any spills in your garage right away. This includes oil or grease spills. What can I do in the bathroom?  Use night lights.  Install grab bars by the toilet and in the tub and shower. Do not use towel bars as grab bars.  Use non-skid mats or decals in the tub or shower.  If you need to sit down in the shower, use a plastic, non-slip stool.  Keep the floor dry. Clean up any water that spills on the floor as soon as it happens.  Remove soap buildup in the tub or shower regularly.  Attach bath mats securely with double-sided non-slip rug tape.  Do not have throw rugs and other things on the floor that can make you trip. What can I do in the  bedroom?  Use night lights.  Make sure that you have a light by your bed that is easy to reach.  Do not use any sheets or blankets that are too big for your bed. They should not hang down onto the floor.  Have a firm chair that has side arms. You can use this for support while you get dressed.  Do not have throw rugs and other things on the floor that can make you trip. What can I do in the kitchen?  Clean up any spills right away.  Avoid walking on wet floors.  Keep items that you use a lot in easy-to-reach places.  If you need to reach something above you, use a strong step stool that has a grab bar.  Keep electrical cords out of the way.  Do not use floor polish or wax that makes floors slippery. If you must use wax, use non-skid floor wax.  Do not have throw rugs and other things on the floor that can make you trip. What can I do with my stairs?  Do not leave any items on the stairs.  Make sure that there are handrails on both sides of the stairs and use them. Fix handrails  that are broken or loose. Make sure that handrails are as long as the stairways.  Check any carpeting to make sure that it is firmly attached to the stairs. Fix any carpet that is loose or worn.  Avoid having throw rugs at the top or bottom of the stairs. If you do have throw rugs, attach them to the floor with carpet tape.  Make sure that you have a light switch at the top of the stairs and the bottom of the stairs. If you do not have them, ask someone to add them for you. What else can I do to help prevent falls?  Wear shoes that: ? Do not have high heels. ? Have rubber bottoms. ? Are comfortable and fit you well. ? Are closed at the toe. Do not wear sandals.  If you use a stepladder: ? Make sure that it is fully opened. Do not climb a closed stepladder. ? Make sure that both sides of the stepladder are locked into place. ? Ask someone to hold it for you, if possible.  Clearly mark and make  sure that you can see: ? Any grab bars or handrails. ? First and last steps. ? Where the edge of each step is.  Use tools that help you move around (mobility aids) if they are needed. These include: ? Canes. ? Walkers. ? Scooters. ? Crutches.  Turn on the lights when you go into a dark area. Replace any light bulbs as soon as they burn out.  Set up your furniture so you have a clear path. Avoid moving your furniture around.  If any of your floors are uneven, fix them.  If there are any pets around you, be aware of where they are.  Review your medicines with your doctor. Some medicines can make you feel dizzy. This can increase your chance of falling. Ask your doctor what other things that you can do to help prevent falls. This information is not intended to replace advice given to you by your health care provider. Make sure you discuss any questions you have with your health care provider. Document Released: 12/21/2008 Document Revised: 08/02/2015 Document Reviewed: 03/31/2014 Elsevier Interactive Patient Education  2018 [place] Maintenance, Male A healthy lifestyle and preventive care is important for your health and wellness. Ask your health care provider about what schedule of regular examinations is right for you. What should I know about weight and diet? Eat a Healthy Diet  Eat plenty of vegetables, fruits, whole grains, low-fat dairy products, and lean protein.  Do not eat a lot of foods high in solid fats, added sugars, or salt.  Maintain a Healthy Weight Regular exercise can help you achieve or maintain a healthy weight. You should:  Do at least 150 minutes of exercise each week. The exercise should increase your heart rate and make you sweat (moderate-intensity exercise).  Do strength-training exercises at least twice a week.  Watch Your Levels of Cholesterol and Blood Lipids  Have your blood tested for lipids and cholesterol every 5 years starting  at 46 years of age. If you are at high risk for heart disease, you should start having your blood tested when you are 46 years old. You may need to have your cholesterol levels checked more often if: ? Your lipid or cholesterol levels are high. ? You are older than 46 years of age. ? You are at high risk for heart disease.  What should I know about cancer screening?  Many types of cancers can be detected early and may often be prevented. Lung Cancer  You should be screened every year for lung cancer if: ? You are a current smoker who has smoked for at least 30 years. ? You are a former smoker who has quit within the past 15 years.  Talk to your health care provider about your screening options, when you should start screening, and how often you should be screened.  Colorectal Cancer  Routine colorectal cancer screening usually begins at 46 years of age and should be repeated every 5-10 years until you are 46 years old. You may need to be screened more often if early forms of precancerous polyps or small growths are found. Your health care provider may recommend screening at an earlier age if you have risk factors for colon cancer.  Your health care provider may recommend using home test kits to check for hidden blood in the stool.  A small camera at the end of a tube can be used to examine your colon (sigmoidoscopy or colonoscopy). This checks for the earliest forms of colorectal cancer.  Prostate and Testicular Cancer  Depending on your age and overall health, your health care provider may do certain tests to screen for prostate and testicular cancer.  Talk to your health care provider about any symptoms or concerns you have about testicular or prostate cancer.  Skin Cancer  Check your skin from head to toe regularly.  Tell your health care provider about any new moles or changes in moles, especially if: ? There is a change in a mole's size, shape, or color. ? You have a mole that  is larger than a pencil eraser.  Always use sunscreen. Apply sunscreen liberally and repeat throughout the day.  Protect yourself by wearing long sleeves, pants, a wide-brimmed hat, and sunglasses when outside.  What should I know about heart disease, diabetes, and high blood pressure?  If you are 3-65 years of age, have your blood pressure checked every 3-5 years. If you are 80 years of age or older, have your blood pressure checked every year. You should have your blood pressure measured twice-once when you are at a hospital or clinic, and once when you are not at a hospital or clinic. Record the average of the two measurements. To check your blood pressure when you are not at a hospital or clinic, you can use: ? An automated blood pressure machine at a pharmacy. ? A home blood pressure monitor.  Talk to your health care provider about your target blood pressure.  If you are between 66-50 years old, ask your health care provider if you should take aspirin to prevent heart disease.  Have regular diabetes screenings by checking your fasting blood sugar level. ? If you are at a normal weight and have a low risk for diabetes, have this test once every three years after the age of 67. ? If you are overweight and have a high risk for diabetes, consider being tested at a younger age or more often.  A one-time screening for abdominal aortic aneurysm (AAA) by ultrasound is recommended for men aged 24-75 years who are current or former smokers. What should I know about preventing infection? Hepatitis B If you have a higher risk for hepatitis B, you should be screened for this virus. Talk with your health care provider to find out if you are at risk for hepatitis B infection. Hepatitis C Blood testing is recommended for:  Everyone  born from [birth year] through 1965.  Anyone with known risk factors for hepatitis C.  Sexually Transmitted Diseases (STDs)  You should be screened each year for STDs  including gonorrhea and chlamydia if: ? You are sexually active and are younger than 46 years of age. ? You are older than 46 years of age and your health care provider tells you that you are at risk for this type of infection. ? Your sexual activity has changed since you were last screened and you are at an increased risk for chlamydia or gonorrhea. Ask your health care provider if you are at risk.  Talk with your health care provider about whether you are at high risk of being infected with HIV. Your health care provider may recommend a prescription medicine to help prevent HIV infection.  What else can I do?  Schedule regular health, dental, and eye exams.  Stay current with your vaccines (immunizations).  Do not use any tobacco products, such as cigarettes, chewing tobacco, and e-cigarettes. If you need help quitting, ask your health care provider.  Limit alcohol intake to no more than 2 drinks per day. One drink equals 12 ounces of beer, 5 ounces of wine, or 1 ounces of hard liquor.  Do not use street drugs.  Do not share needles.  Ask your health care provider for help if you need support or information about quitting drugs.  Tell your health care provider if you often feel depressed.  Tell your health care provider if you have ever been abused or do not feel safe at home. This information is not intended to replace advice given to you by your health care provider. Make sure you discuss any questions you have with your health care provider. Document Released: 08/23/2007 Document Revised: 10/24/2015 Document Reviewed: 11/28/2014 Elsevier Interactive Patient Education  [employer].

## 2016-08-08 LAB — CBC WITH DIFFERENTIAL/PLATELET
BASOS ABS: 0 10*3/uL (ref 0.0–0.2)
Basos: 0 %
EOS (ABSOLUTE): 0.1 10*3/uL (ref 0.0–0.4)
EOS: 3 %
Hematocrit: 38.3 % (ref 37.5–51.0)
Hemoglobin: 12.4 g/dL — ABNORMAL LOW (ref 13.0–17.7)
Immature Grans (Abs): 0 10*3/uL (ref 0.0–0.1)
Immature Granulocytes: 0 %
LYMPHS: 33 %
Lymphocytes Absolute: 1.1 10*3/uL (ref 0.7–3.1)
MCH: 28.9 pg (ref 26.6–33.0)
MCHC: 32.4 g/dL (ref 31.5–35.7)
MCV: 89 fL (ref 79–97)
Monocytes Absolute: 0.3 10*3/uL (ref 0.1–0.9)
Monocytes: 8 %
Neutrophils Absolute: 1.8 10*3/uL (ref 1.4–7.0)
Neutrophils: 56 %
Platelets: 200 10*3/uL (ref 150–379)
RBC: 4.29 x10E6/uL (ref 4.14–5.80)
RDW: 13.5 % (ref 12.3–15.4)
WBC: 3.2 10*3/uL — ABNORMAL LOW (ref 3.4–10.8)

## 2016-08-08 LAB — BASIC METABOLIC PANEL
BUN/Creatinine Ratio: 16 (ref 9–20)
BUN: 15 mg/dL (ref 6–24)
CO2: 22 mmol/L (ref 18–29)
Calcium: 8.8 mg/dL (ref 8.7–10.2)
Chloride: 109 mmol/L — ABNORMAL HIGH (ref 96–106)
Creatinine, Ser: 0.95 mg/dL (ref 0.76–1.27)
GFR, EST AFRICAN AMERICAN: 111 mL/min/{1.73_m2} (ref >59)
GFR, EST NON AFRICAN AMERICAN: 96 mL/min/{1.73_m2} (ref >59)
Glucose: 93 mg/dL (ref 65–99)
Potassium: 4.3 mmol/L (ref 3.5–5.2)
Sodium: 145 mmol/L — ABNORMAL HIGH (ref 134–144)

## 2016-08-08 LAB — TSH: TSH: 1.08 u[IU]/mL (ref 0.450–4.500)

## 2016-08-08 LAB — HIV ANTIBODY (ROUTINE TESTING W REFLEX): HIV Screen 4th Generation wRfx: NONREACTIVE

## 2016-08-15 ENCOUNTER — Encounter: Payer: Self-pay | Admitting: *Deleted

## 2016-08-15 NOTE — Progress Notes (Signed)
I have reviewed this visit and discussed with Howell Rucks, RN, BSN, and agree with her documentation.   Elberta Leatherwood, MD,MS,  PGY3 08/15/2016 10:33 AM

## 2016-09-15 DIAGNOSIS — N200 Calculus of kidney: Secondary | ICD-10-CM | POA: Diagnosis not present

## 2016-09-16 ENCOUNTER — Other Ambulatory Visit: Payer: Self-pay | Admitting: Family Medicine

## 2016-09-16 DIAGNOSIS — R532 Functional quadriplegia: Secondary | ICD-10-CM

## 2016-09-17 ENCOUNTER — Other Ambulatory Visit: Payer: Self-pay | Admitting: Family Medicine

## 2016-09-17 ENCOUNTER — Other Ambulatory Visit: Payer: Self-pay | Admitting: *Deleted

## 2016-09-17 DIAGNOSIS — R532 Functional quadriplegia: Secondary | ICD-10-CM

## 2016-09-17 NOTE — Telephone Encounter (Addendum)
Patient calling to request refill of:  Name of Medication(s):  Baclofen  Pharmacy:  [facility] on [address]  [place], [place]

## 2016-09-17 NOTE — Telephone Encounter (Signed)
Called patient back to schedule visit with new PCP, Dr. Yisroel Ramming for further refills Appt made for 09/24/2016 at 1:55. Hubbard Hartshorn, RN, BSN

## 2016-09-17 NOTE — Telephone Encounter (Signed)
Thanks. Will wait till appt for refill. -- Harriet Butte, [place], PGY-2

## 2016-09-19 MED ORDER — BACLOFEN 20 MG PO TABS: 20.0000 mg | ORAL_TABLET | Freq: Four times a day (QID) | ORAL | 5 refills | Status: DC

## 2016-09-19 NOTE — Telephone Encounter (Signed)
Pt would have refill of baclofen.  He has an appt with dr Yisroel Ramming on July 18.  He states he is in pain.  He doesn't understand why it has been denied

## 2016-09-19 NOTE — Telephone Encounter (Signed)
2nd request. Pt would like PCP to call him today. ep

## 2016-09-19 NOTE — Addendum Note (Signed)
Addended by: Derl Barrow on: 09/19/2016 11:15 AM   Modules accepted: Orders

## 2016-09-19 NOTE — Telephone Encounter (Signed)
Pt called again.  He is upset because he needs his medicine and he has never had a dr refuse it before. He will ask for a new dr at his next appt

## 2016-09-22 DIAGNOSIS — R310 Gross hematuria: Secondary | ICD-10-CM | POA: Diagnosis not present

## 2016-09-22 DIAGNOSIS — Z87828 Personal history of other (healed) physical injury and trauma: Secondary | ICD-10-CM | POA: Insufficient documentation

## 2016-09-22 DIAGNOSIS — N319 Neuromuscular dysfunction of bladder, unspecified: Secondary | ICD-10-CM | POA: Diagnosis not present

## 2016-09-22 DIAGNOSIS — N5201 Erectile dysfunction due to arterial insufficiency: Secondary | ICD-10-CM | POA: Diagnosis not present

## 2016-09-22 DIAGNOSIS — N529 Male erectile dysfunction, unspecified: Secondary | ICD-10-CM | POA: Insufficient documentation

## 2016-09-22 NOTE — Progress Notes (Signed)
   Subjective:   Patient ID: Andrew Sullivan    DOB: November 27, 1970, 46 y.o. male   MRN: [id number]  CC: "Medication refill"  HPI: Andrew Sullivan is a 46 y.o. male who presents to clinic today for medication refill. Problems discussed today are as follows:  Spastic quadriplegia: Ongoing issue since 1988 after accident resulting in quadriplegia. Patient has been taking baclofen to help control spasms which successfully controls issue. Patient has not been taking it in the last month because his prescription ran out and is having difficulty sleeping.  ROS: Denies loss of bowel or bladder, saddle anesthesia, fevers or chills, nausea or vomiting.  Complete ROS performed, see HPI for pertinent.  [place]: Functional quadriplegic with spastic tetraplegia, recurrent UTIs, normocytic anemia, ED, closed fracture of cervical vertebra. Surgical hip arthroplasty, cervical fusion C4-C6 1988, cystoscopy with stent placement. Family history arthritis and HTN. Smoking status reviewed. Medications reviewed.  Objective:   BP 112/62   Pulse 98   Temp 98.4 F (36.9 C) (Oral)   Ht 6\' 2"  (1.88 m)   SpO2 98%  Vitals and nursing note reviewed.  General: wheelchair-bound male, well nourished, well developed, in no acute distress with non-toxic appearance CV: regular rate and rhythm without murmurs, rubs, or gallops, no lower extremity edema Lungs: clear to auscultation bilaterally with normal work of breathing Skin: warm, dry, no rashes or lesions, cap refill < 2 seconds Extremities: warm and well perfused, mild contracture hands bilaterally especially at ring and pinky finger  Assessment & Plan:   Spastic tetraplegia (HCC) Chronic. Uncontrolled due to fecalith and expiration. Requesting refill. --Prescribed baclofen 20 mg tablets every 6 hours as needed for 6 month supply with 120 pills, 5 refills --Tdap offered however patient refused  No orders of the defined types were placed in this  encounter.  Meds ordered this encounter  Medications  . baclofen (LIORESAL) 20 MG tablet    Sig: Take 1 tablet (20 mg total) by mouth 4 (four) times daily.    Dispense:  120 tablet    Refill:  [place], Vivian, PGY-2 09/24/2016 2:33 PM

## 2016-09-24 ENCOUNTER — Encounter: Payer: Self-pay | Admitting: Family Medicine

## 2016-09-24 ENCOUNTER — Ambulatory Visit (INDEPENDENT_AMBULATORY_CARE_PROVIDER_SITE_OTHER): Payer: Medicare HMO | Admitting: Family Medicine

## 2016-09-24 VITALS — BP 112/62 | HR 98 | Temp 98.4°F | Ht 74.0 in

## 2016-09-24 DIAGNOSIS — R532 Functional quadriplegia: Secondary | ICD-10-CM

## 2016-09-24 DIAGNOSIS — G8250 Quadriplegia, unspecified: Secondary | ICD-10-CM

## 2016-09-24 DIAGNOSIS — Z0000 Encounter for general adult medical examination without abnormal findings: Secondary | ICD-10-CM

## 2016-09-24 MED ORDER — BACLOFEN 20 MG PO TABS: 20.0000 mg | ORAL_TABLET | Freq: Four times a day (QID) | ORAL | 5 refills | Status: DC

## 2016-09-24 NOTE — Assessment & Plan Note (Addendum)
Chronic. Uncontrolled due to fecalith and expiration. Requesting refill. --Prescribed baclofen 20 mg tablets every 6 hours as needed for 6 month supply with 120 pills, 5 refills --Tdap offered however patient refused

## 2016-09-24 NOTE — Patient Instructions (Signed)
Thank you for coming in to see [place] today. Please see below to review our plan for today's visit.  1. I have sent in a prescription for a six-month supply of baclofen. Please take as instructed.  2. If Cialis seems to be too expensive, please let me know and I will get you a prescription for the generic Viagra which would be much more expensive. 3. Please consider getting your tetanus booster during her next visit. This is a very important vaccination.  Return to clinic in 6 months.  Please call the clinic at [phone] if your symptoms worsen or you have any concerns. It was my pleasure to see you. -- Harriet Butte, [place], PGY-2   Diphtheria, Tetanus, Acellular Pertussis, Hepatitis B, Poliovirus Vaccine What is this medicine? DIPHTHERIA TOXOID, TETANUS TOXOID, ACELLULAR PERTUSSIS VACCINE, DTaP; HEPATITIS B VACCINE, RECOMBINANT; INACTIVATED POLIOVIRUS VACCINE, IPV (dif THEER ee uh TOK soid, TET n [place] TOK soid, ey SEL yuh ler per TUS iss VAK seen, DTaP; hep uh TAHY tis B VAK seen; in ak tuh vey ted poh lee oh vahy ruhs VAK seen, IPV ) is used to prevent infections of diphtheria, tetanus (lockjaw), pertussis (whooping cough), hepatitis B, and poliovirus. This medicine may be used for other purposes; ask your health care provider or pharmacist if you have questions. COMMON BRAND NAME(S): Pediarix What should I tell my health care provider before I take this medicine? They need to know if you have any of these conditions: -infection with fever -neurological disease -seizure disorder -an unusual or allergic reaction to vaccines, yeast, neomycin, polymyxin B, latex, other medicines, foods, dyes, or preservatives -pregnant or trying to get pregnant -breast-feeding How should I use this medicine? This vaccine is for injection into a muscle. It is given by a health care professional. A copy of Vaccine Information Statements will be given before each vaccination. Read  this sheet carefully each time. The sheet may change frequently. Talk to your pediatrician regarding the use of this medicine in children. While this drug may be prescribed for children as young as 55 weeks old for selected conditions, precautions do apply. Overdosage: If you think you have taken too much of this medicine contact a poison control center or emergency room at once. NOTE: This medicine is only for you. Do not share this medicine with others. What if I miss a dose? Keep appointments for follow-up (booster) doses as directed. It is important not to miss your dose. Call your doctor or health care professional if you are unable to keep an appointment. What may interact with this medicine? -adalimumab -anakinra -infliximab -medicines that suppress your immune system -medicines to treat cancer -steroid medicines like prednisone or cortisone This list may not describe all possible interactions. Give your health care provider a list of all the medicines, herbs, non-prescription drugs, or dietary supplements you use. Also tell them if you smoke, drink alcohol, or use illegal drugs. Some items may interact with your medicine. What should I watch for while using this medicine? Visit your doctor for regular check-ups as directed. This vaccine, like all vaccines, may not fully protect everyone. What side effects may I notice from receiving this medicine? Side effects that you should report to your doctor or health care professional as soon as possible: -allergic reactions like skin rash, itching or hives, swelling of the face, lips, or tongue -blueish color to lips or nail beds -breathing problems -extreme changes in behavior -fever over 101 degrees F -  inconsolable crying for 3 hours or more -seizures -unusual bruising or bleeding -unusually weak or tired Side effects that usually do not require medical attention (report to your doctor or health care professional if they continue or are  bothersome): -aches or pains -bruising, pain, swelling at site where injected -diarrhea -fussy -low-grade fever -loss of appetite -sleepy -vomiting This list may not describe all possible side effects. Call your doctor for medical advice about side effects. You may report side effects to FDA at 1-800-FDA-1088. Where should I keep my medicine? This drug is given in a hospital or clinic and will not be stored at home. NOTE: This sheet is a summary. It may not cover all possible information. If you have questions about this medicine, talk to your doctor, pharmacist, or health care provider.  2018 Elsevier/Gold Standard (2007-07-26 20:51:02)

## 2016-09-29 DIAGNOSIS — R310 Gross hematuria: Secondary | ICD-10-CM | POA: Diagnosis not present

## 2016-09-29 DIAGNOSIS — R319 Hematuria, unspecified: Secondary | ICD-10-CM | POA: Diagnosis not present

## 2016-11-03 DIAGNOSIS — N319 Neuromuscular dysfunction of bladder, unspecified: Secondary | ICD-10-CM | POA: Diagnosis not present

## 2016-11-03 DIAGNOSIS — R310 Gross hematuria: Secondary | ICD-10-CM | POA: Diagnosis not present

## 2016-11-03 DIAGNOSIS — N5201 Erectile dysfunction due to arterial insufficiency: Secondary | ICD-10-CM | POA: Diagnosis not present

## 2017-03-20 ENCOUNTER — Other Ambulatory Visit: Payer: Self-pay | Admitting: Family Medicine

## 2017-03-20 ENCOUNTER — Telehealth: Payer: Self-pay | Admitting: Family Medicine

## 2017-03-20 DIAGNOSIS — G8250 Quadriplegia, unspecified: Secondary | ICD-10-CM

## 2017-03-20 DIAGNOSIS — R532 Functional quadriplegia: Secondary | ICD-10-CM

## 2017-03-20 MED ORDER — BACLOFEN 20 MG PO TABS: 20.0000 mg | ORAL_TABLET | Freq: Four times a day (QID) | ORAL | 5 refills | Status: DC

## 2017-03-20 NOTE — Telephone Encounter (Signed)
Needs refill on baclafan.  [facility] [address]. Pt has an appt 04-14-17

## 2017-04-16 NOTE — Progress Notes (Signed)
   Subjective   Patient ID: Andrew Sullivan    DOB: [birth year], 47 y.o. male   MRN: [id number]  CC: "Medication refill"  HPI: Andrew Sullivan is a 47 y.o. male who presents to clinic today for the following:  Spastic quadriplegic: Ongoing issue since 1988 following traumatic fall resulting in quadriplegia.  Symptoms well controlled with chronic baclofen use.  Here today for refill.  Has been seen by physical therapy in the past with successful improvement of symptoms after 3 months of therapy which typically lasts for over 6 months.  Patient requesting to return for additional physical therapy today.  Patient denies headache, fevers or chills, neck pain, shortness of breath, chest pain, nausea or vomiting, abdominal pain, dysuria or polyuria, extremity pain.  ROS: see HPI for pertinent.  [place]: Functional quadriplegic with spastic tetraplegia, recurrent UTIs, normocytic anemia, ED, closed fracture of cervical vertebra. Surgical hip arthroplasty, cervical fusion C4-C6 1988, cystoscopy with stent placement. Family history arthritis and HTN. Smoking status reviewed. Medications reviewed.  Objective   BP 112/68   Pulse (!) 59   Temp 97.8 F (36.6 C) (Oral)   Ht 6\' 2"  (1.88 m)   SpO2 98%   BMI 21.70 kg/m  Vitals and nursing note reviewed.  General: wheelchair-bound male, NAD with non-toxic appearance HEENT: normocephalic, atraumatic, moist mucous membranes Cardiovascular: regular rate and rhythm without murmurs, rubs, or gallops Lungs: clear to auscultation bilaterally with normal work of breathing Skin: warm, dry, no rashes or lesions, cap refill < 2 seconds Extremities: warm and well perfused, normal tone, no edema, mild contracture of hands bilaterally, equal grip strength and upper and lower motor strength bilaterally with increased function of upper extremities compared to lower extremities  Assessment & Plan   Spastic tetraplegia (HCC) Chronic. Well controlled with baclofen  use. No signs of worsening functional status or contractures. - Refill baclofen 20 mg four times daily, 6 month supply - Tdap vaccine prescription given - RTC in 6 months or sooner if needed  No orders of the defined types were placed in this encounter.  Meds ordered this encounter  Medications  . baclofen (LIORESAL) 20 MG tablet    Sig: Take 1 tablet (20 mg total) by mouth 4 (four) times daily.    Dispense:  120 tablet    Refill:  5  . Tdap (ADACEL) 07-09-13.5 LF-MCG/0.5 injection    Sig: Inject 0.5 mLs into the muscle once for 1 dose.    Dispense:  0.5 mL    Refill:  0    Harriet Butte, DO [place], PGY-2 04/20/2017, 2:23 PM

## 2017-04-17 ENCOUNTER — Encounter: Payer: Self-pay | Admitting: Family Medicine

## 2017-04-17 ENCOUNTER — Other Ambulatory Visit: Payer: Self-pay

## 2017-04-17 ENCOUNTER — Ambulatory Visit (INDEPENDENT_AMBULATORY_CARE_PROVIDER_SITE_OTHER): Payer: Medicare HMO | Admitting: Family Medicine

## 2017-04-17 VITALS — BP 112/68 | HR 59 | Temp 97.8°F | Ht 74.0 in

## 2017-04-17 DIAGNOSIS — R532 Functional quadriplegia: Secondary | ICD-10-CM

## 2017-04-17 DIAGNOSIS — G8250 Quadriplegia, unspecified: Secondary | ICD-10-CM | POA: Diagnosis not present

## 2017-04-17 MED ORDER — TETANUS-DIPHTH-ACELL PERTUSSIS 5-2-15.5 LF-MCG/0.5 IM SUSP: 0.5000 mL | Freq: Once | INTRAMUSCULAR | 0 refills | Status: AC

## 2017-04-17 MED ORDER — BACLOFEN 20 MG PO TABS: 20.0000 mg | ORAL_TABLET | Freq: Four times a day (QID) | ORAL | 5 refills | Status: DC

## 2017-04-17 NOTE — Assessment & Plan Note (Addendum)
Chronic. Well controlled with baclofen use. No signs of worsening functional status or contractures. - Refill baclofen 20 mg four times daily, 6 month supply - Tdap vaccine prescription given - RTC in 6 months or sooner if needed

## 2017-04-17 NOTE — Patient Instructions (Addendum)
Thank you for coming in to see [place] today. Please see below to review our plan for today's visit.  1.  Please call [phone] and request to be evaluated for physical therapy.  If they request an additional referral, please let our office know and I will place it. 2.  Let us know if you want to receive the flu vaccine.  I strongly encourage this.  I have given you a prescription for your tetanus booster.  Please take this to your local pharmacy of the vaccine.  This will cover you for 10 years. 3.  I will see you again in 6 months for your next check up.  Please call the clinic at [phone] if your symptoms worsen or you have any concerns. It was our pleasure to serve you.  Harriet Butte, [place], PGY-2

## 2017-10-19 DIAGNOSIS — R310 Gross hematuria: Secondary | ICD-10-CM | POA: Diagnosis not present

## 2017-10-20 ENCOUNTER — Other Ambulatory Visit: Payer: Self-pay | Admitting: Family Medicine

## 2017-10-20 DIAGNOSIS — G8250 Quadriplegia, unspecified: Secondary | ICD-10-CM

## 2017-10-20 DIAGNOSIS — R532 Functional quadriplegia: Secondary | ICD-10-CM

## 2017-11-02 DIAGNOSIS — R310 Gross hematuria: Secondary | ICD-10-CM | POA: Diagnosis not present

## 2017-11-02 DIAGNOSIS — N5201 Erectile dysfunction due to arterial insufficiency: Secondary | ICD-10-CM | POA: Diagnosis not present

## 2018-02-25 ENCOUNTER — Ambulatory Visit (INDEPENDENT_AMBULATORY_CARE_PROVIDER_SITE_OTHER): Payer: 59

## 2018-02-25 ENCOUNTER — Telehealth: Payer: Self-pay

## 2018-02-25 ENCOUNTER — Other Ambulatory Visit: Payer: Self-pay

## 2018-02-25 VITALS — BP 140/80 | HR 72 | Temp 97.5°F

## 2018-02-25 DIAGNOSIS — Z0000 Encounter for general adult medical examination without abnormal findings: Secondary | ICD-10-CM | POA: Diagnosis not present

## 2018-02-25 DIAGNOSIS — G8250 Quadriplegia, unspecified: Secondary | ICD-10-CM

## 2018-02-25 DIAGNOSIS — R532 Functional quadriplegia: Secondary | ICD-10-CM

## 2018-02-25 MED ORDER — BACLOFEN 20 MG PO TABS: 20.0000 mg | ORAL_TABLET | Freq: Four times a day (QID) | ORAL | 5 refills | Status: DC

## 2018-02-25 NOTE — Telephone Encounter (Signed)
Placed in MDs box to be filled out. Ahonesty Woodfin, CMA  

## 2018-02-25 NOTE — Telephone Encounter (Signed)
PCS form dropped off for completion.   Placed form in team folder to be completed by clinical staff.  Esau Grew, RN

## 2018-02-25 NOTE — Patient Instructions (Signed)

## 2018-02-25 NOTE — Progress Notes (Signed)
Subjective:   Andrew Sullivan is a 47 y.o. male who presents for Medicare Annual/Subsequent preventive examination.  Review of Systems:  Physical assessment deferred to PCP.  Cardiac Risk Factors include: male gender     Objective:    Vitals: BP 140/80   Pulse 72   Temp (!) 97.5 F (36.4 C) (Oral)   SpO2 99%   There is no height or weight on file to calculate BMI.  Advanced Directives 02/25/2018 04/17/2017 09/24/2016 08/07/2016 10/23/2015 10/18/2015 10/04/2015  Does Patient Have a Medical Advance Directive? No No No No No No No  Would patient like information on creating a medical advance directive? No - Patient declined No - Patient declined No - Patient declined Yes (MAU/Ambulatory/Procedural Areas - Information given) No - patient declined information No - patient declined information No - patient declined information  Pre-existing out of facility DNR order (yellow form or pink MOST form) - - - - - - -    Tobacco Social History   Tobacco Use  Smoking Status Former Smoker  . Packs/day: 0.25  . Years: 0.50  . Pack years: 0.12  . Types: Cigarettes  . Last attempt to quit: 12/27/1986  . Years since quitting: 31.1  Smokeless Tobacco Never Used     Counseling given: Not Answered   Clinical Intake:  Pre-visit preparation completed: Yes  Pain : No/denies pain Pain Score: 0-No pain     Nutritional Status: BMI of 19-24  Normal Diabetes: No  How often do you need to have someone help you when you read instructions, pamphlets, or other written materials from your doctor or pharmacy?: 1 - Never What is the last grade level you completed in school?: High school  Interpreter Needed?: No     Past Medical History:  Diagnosis Date  . Erectile dysfunction   . Gross hematuria   . History of recurrent UTIs   . History of spinal cord injury    51 (age 40 fell from ladder) w/ cervical spine fx  s/p  c4 -- c6  fusion--  residual paraplegic bilateral legs (great below T8  uses w/c and can transfer self  . Paraparesis of both lower limbs ([place])    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 86 in 4  . Spastic tetraplegia ([place])    Past Surgical History:  Procedure Laterality Date  . CERVICAL FUSION  1988   C4 -- C6 w/ spinal cord injury  . CYSTOSCOPY WITH RETROGRADE PYELOGRAM, URETEROSCOPY AND STENT PLACEMENT Left 12/27/2013   Procedure: CYSTOSCOPY WITH RETROGRADE PYELOGRAM, URETEROSCOPY AND STENT PLACEMENT;  Surgeon: Reece Packer, MD;  Location: [facility];  Service: Urology;  Laterality: Left;  . CYSTOSCOPY/RETROGRADE/URETEROSCOPY Left 01/09/2014   Procedure: CYSTOSCOPY/RETROGRADE/URETEROSCOPY, URETERAL BIOPSY AND STENT EXCHANGE;  Surgeon: Alexis Frock, MD;  Location[facility];  Service: Urology;  Laterality: Left;  . HIP ARTHROPLASTY  08/12/2011   Procedure: ARTHROPLASTY BIPOLAR HIP;  Surgeon: Mauri Pole, MD;  Location[facility];  Service: Orthopedics;  Laterality: Left;  Hemi-arthroplasty   Family History  Problem Relation Age of Onset  . Arthritis Mother   . Hypertension Mother    Social History   Socioeconomic History  . Marital status: Single    Spouse name: Not on file  . Number of children: Not on file  . Years of education: 58  . Highest education level: 12th grade  Occupational History  . Not on file  Social Needs  . Financial resource strain: Very hard  .  Food insecurity:    Worry: Sometimes true    Inability: Sometimes true  . Transportation needs:    Medical: Yes    Non-medical: Yes  Tobacco Use  . Smoking status: Former Smoker    Packs/day: 0.25    Years: 0.50    Pack years: 0.12    Types: Cigarettes    Last attempt to quit: 12/27/1986    Years since quitting: 31.1  . Smokeless tobacco: Never Used  Substance and Sexual Activity  . Alcohol use: No  . Drug use: No  . Sexual activity: Yes  Lifestyle  . Physical activity:    Days per week: 3 days    Minutes per session: 50 min  . Stress: Not at all   Relationships  . Social connections:    Talks on phone: Once a week    Gets together: Three times a week    Attends religious service: More than 4 times per year    Active member of club or organization: No    Attends meetings of clubs or organizations: Never    Relationship status: Never married  Other Topics Concern  . Not on file  Social History Narrative   Lives alone in apartment. Ground level. No pets.1 brother 1 sister. Remains active.  Is on disability secondary to neck fracture. Stays active going to gym and church. Uses [employer] for transportation. No smoke alarms.      Likes to eat chicken, rice, some greens, apples. Likes to drink water, milk, cranberry juice, unsweet tea.      No hobbies, likes to watch TV, goes to gym.     Outpatient Encounter Medications as of 02/25/2018  Medication Sig  . baclofen (LIORESAL) 20 MG tablet Take 1 tablet (20 mg total) by mouth 4 (four) times daily.  . baclofen (LIORESAL) 20 MG tablet TAKE 1 TABLET BY MOUTH 4 TIMES DAILY (Patient not taking: Reported on 02/25/2018)   No facility-administered encounter medications on file as of 02/25/2018.     Activities of Daily Living In your present state of health, do you have any difficulty performing the following activities: 02/25/2018  Hearing? N  Vision? Y  Difficulty concentrating or making decisions? N  Walking or climbing stairs? Y  Dressing or bathing? N  Doing errands, shopping? N  Preparing Food and eating ? N  Using the Toilet? N  In the past six months, have you accidently leaked urine? N  Do you have problems with loss of bowel control? N  Managing your Medications? N  Managing your Finances? N  Housekeeping or managing your Housekeeping? N  Some recent data might be hidden    Patient Care Team: [place] Bing, DO as PCP - General Alexis Frock, MD as Consulting Physician (Urology)   Assessment:   This is a routine wellness examination for [place].  Exercise  Activities and Dietary recommendations Current Exercise Habits: Structured exercise class, Type of exercise: strength training/weights, Time (Minutes): 45, Frequency (Times/Week): 3, Weekly Exercise (Minutes/Week): 135, Intensity: Mild, Exercise limited by: orthopedic condition(s);neurologic condition(s)  Goals    . Exercise 3x per week (45 min per time)     Maintain this regimen    . Prevent Falls       Fall Risk Fall Risk  02/25/2018 04/17/2017 09/24/2016 08/07/2016 08/07/2016  Falls in the past year? Exclusion - non ambulatory No No - Yes  Number falls in past yr: - - - - 2 or more  Comment - - - - 4-5  Injury with Fall? - - - - Yes  Comment - - - - scratched backs of hands  Risk Factor Category  - - - (No Data) High Fall Risk  Comment - - - PCP notified -  Risk for fall due to : - - - - History of fall(s);Impaired balance/gait;Impaired mobility  Follow up - - - - Falls evaluation completed;Education provided;Falls prevention discussed   Is the patient's home free of loose throw rugs in walkways, pet beds, electrical cords, etc?   yes      Grab bars in the bathroom? yes      Handrails on the stairs?   yes      Adequate lighting?   yes    Depression Screen PHQ 2/9 Scores 02/25/2018 04/17/2017 08/07/2016 10/04/2015  PHQ - 2 Score 0 0 0 0    Cognitive Function MMSE - Mini Mental State Exam 02/25/2018  Orientation to time 5  Orientation to Place 5  Registration 3  Attention/ Calculation 5  Recall 3  Language- name 2 objects 2  Language- repeat 1  Language- follow 3 step command 3  Language- read & follow direction 1  Write a sentence 1  Copy design 1  Total score 30     6CIT Screen 02/25/2018  What Year? 0 points  What month? 0 points  What time? 0 points  Count back from 20 0 points  Months in reverse 0 points  Repeat phrase 0 points  Total Score 0    Immunization History  Administered Date(s) Administered  . Td 02/07/2001    Screening Tests Health  Maintenance  Topic Date Due  . INFLUENZA VACCINE  06/08/2018 (Originally 10/08/2017)  . TETANUS/TDAP  02/26/2019 (Originally 02/08/2011)  . HIV Screening  Completed   Cancer Screenings: Lung: Low Dose CT Chest recommended if Age 1-80 years, 30 pack-year currently smoking OR have quit w/in 15years. Patient does not qualify. Colorectal: N/A  Additional Screenings:  HIV: complete      Plan:  Patient declined flu vaccine. F/U appt made with PCP for 04/07/18.   I have personally reviewed and noted the following in the patient's chart:   . Medical and social history . Use of alcohol, tobacco or illicit drugs  . Current medications and supplements . Functional ability and status . Nutritional status . Physical activity . Advanced directives . List of other physicians . Hospitalizations, surgeries, and ER visits in previous 12 months . Vitals . Screenings to include cognitive, depression, and falls . Referrals and appointments  In addition, I have reviewed and discussed with patient certain preventive protocols, quality metrics, and best practice recommendations. A written personalized care plan for preventive services as well as general preventive health recommendations were provided to patient.     Esau Grew, RN  02/25/2018

## 2018-02-26 NOTE — Progress Notes (Signed)
I have reviewed and agree with Alisa Brake's assessment and plan.  Harriet Butte, [place], PGY-3

## 2018-02-26 NOTE — Telephone Encounter (Signed)
Form completed and left in RN box.  Olar Santini, DO [facility], PGY-3  

## 2018-03-01 NOTE — Telephone Encounter (Signed)
Forms faxed and a copy made for batch scanning.

## 2018-04-07 ENCOUNTER — Ambulatory Visit (INDEPENDENT_AMBULATORY_CARE_PROVIDER_SITE_OTHER): Payer: Medicare HMO | Admitting: Family Medicine

## 2018-04-07 VITALS — BP 145/100 | HR 98 | Temp 98.1°F | Wt 150.0 lb

## 2018-04-07 DIAGNOSIS — G8250 Quadriplegia, unspecified: Secondary | ICD-10-CM | POA: Diagnosis not present

## 2018-04-07 DIAGNOSIS — Z0000 Encounter for general adult medical examination without abnormal findings: Secondary | ICD-10-CM | POA: Diagnosis not present

## 2018-04-07 NOTE — Patient Instructions (Signed)
Thank you for coming in to see Andrew Sullivan today. Please see below to review our plan for today's visit.  1.  I put in a referral for you to see physical therapy along with receiving a home aide.  I will work on turning and paperwork later this week so that you can receive these services. 2.  I will call you if there are any abnormalities in your blood work, otherwise you should receive results in the mail. 3.  Please return in 1 year or sooner as needed.  Please call the clinic at [phone] if your symptoms worsen or you have any concerns. It was our pleasure to serve you.  Harriet Butte, [place], PGY-3

## 2018-04-07 NOTE — Progress Notes (Signed)
   Subjective   Patient ID: Andrew Sullivan    DOB: 1970-08-30, 48 y.o. male   MRN: [id number]  CC: "Need an aid"  HPI: Andrew Sullivan is a 48 y.o. male who presents to clinic today for the following:  Spastic quadriplegia with mobility impairment: Patient here today for annual visit.  He has underlying spastic quadriplegia from a accident in 58.  His pain is well controlled with daily baclofen.  He does feel that he is having more difficulty at home doing chores such as cooking and cleaning and gets fatigued more easily.  He does ambulate with a manual wheelchair.  He is working towards getting approval for a homemade to assist him in his daily tasks.  He denies symptoms including fevers or chills, cough, shortness of breath, chest pain, nausea vomiting, abdominal pain, worsening motor weakness or sensory loss.  ROS: see HPI for pertinent.  [place]: Functional quadriplegic with spastic tetraplegia, recurrent UTIs, normocytic anemia, ED, closed fracture of cervical vertebra.Surgical hip arthroplasty, cervical fusion C4-C6 1988, cystoscopy with stent placement. Family history arthritis and HTN. Smoking status reviewed. Medications reviewed.  Objective   BP (!) 145/100   Pulse 98   Temp 98.1 F (36.7 C)   Wt 150 lb (68 kg)   SpO2 99%   BMI 19.26 kg/m  Vitals and nursing note reviewed.  General: pleasant male in wheelchair, well nourished, well developed, NAD with non-toxic appearance HEENT: normocephalic, atraumatic, moist mucous membranes Cardiovascular: regular rate and rhythm without murmurs, rubs, or gallops Lungs: clear to auscultation bilaterally with normal work of breathing Abdomen: soft, non-tender, non-distended, normoactive bowel sounds Skin: warm, dry, no rashes or lesions, cap refill < 2 seconds Extremities: warm and well perfused, no edema, 3/5 strength on upper extremities bilaterally with 1/5 strength on grip strength bilaterally and 1/5 motor strength on lower  extremities  Assessment & Plan   Spastic tetraplegia (HCC) Chronic.  Stable.  I do feel patient would benefit from receiving a home aide for assistance with ADLs given increased fatigue with his extremities.  He is tolerating his baclofen.  Patient also would benefit with PT. - Home health placed for home aide - Ambulatory referral to PT - Continue baclofen 20 mg 4 times daily - RTC 1 year or sooner if needed  Orders Placed This Encounter  Procedures  . CBC  . Basic metabolic panel  . Ambulatory referral to Home Health    Referral Priority:   Routine    Referral Type:   Home Health Care    Referral Reason:   Specialty Services Required    Requested Specialty:   [place]    Number of Visits Requested:   1  . Ambulatory referral to Physical Therapy    Referral Priority:   Routine    Referral Type:   Physical Medicine    Referral Reason:   Specialty Services Required    Requested Specialty:   Physical Therapy    Number of Visits Requested:   1   No orders of the defined types were placed in this encounter.   Harriet Butte, [place], PGY-3 04/08/2018, 9:21 AM

## 2018-04-08 ENCOUNTER — Encounter: Payer: Self-pay | Admitting: Family Medicine

## 2018-04-08 LAB — CBC
HEMATOCRIT: 37.5 % (ref 37.5–51.0)
Hemoglobin: 12 g/dL — ABNORMAL LOW (ref 13.0–17.7)
MCH: 26.7 pg (ref 26.6–33.0)
MCHC: 32 g/dL (ref 31.5–35.7)
MCV: 84 fL (ref 79–97)
Platelets: 238 10*3/uL (ref 150–450)
RBC: 4.49 x10E6/uL (ref 4.14–5.80)
RDW: 12.8 % (ref 11.6–15.4)
WBC: 4.3 10*3/uL (ref 3.4–10.8)

## 2018-04-08 LAB — BASIC METABOLIC PANEL
BUN/Creatinine Ratio: 23 — ABNORMAL HIGH (ref 9–20)
BUN: 18 mg/dL (ref 6–24)
CO2: 24 mmol/L (ref 20–29)
Calcium: 8.5 mg/dL — ABNORMAL LOW (ref 8.7–10.2)
Chloride: 103 mmol/L (ref 96–106)
Creatinine, Ser: 0.8 mg/dL (ref 0.76–1.27)
GFR calc Af Amer: 123 mL/min/{1.73_m2} (ref >59)
GFR calc non Af Amer: 106 mL/min/{1.73_m2} (ref >59)
Glucose: 91 mg/dL (ref 65–99)
Potassium: 4.5 mmol/L (ref 3.5–5.2)
Sodium: 140 mmol/L (ref 134–144)

## 2018-04-08 NOTE — Assessment & Plan Note (Addendum)
Chronic.  Stable.  I do feel patient would benefit from receiving a home aide for assistance with ADLs given increased fatigue with his extremities.  He is tolerating his baclofen.  Patient also would benefit with PT. - Home health placed for home aide - Ambulatory referral to PT - Continue baclofen 20 mg 4 times daily - Checking routine labs including CBC and BMET - RTC 1 year or sooner if needed

## 2018-05-13 ENCOUNTER — Ambulatory Visit: Payer: Medicare HMO

## 2018-05-19 ENCOUNTER — Telehealth: Payer: Self-pay | Admitting: Family Medicine

## 2018-05-19 ENCOUNTER — Ambulatory Visit: Payer: Medicare HMO | Attending: Family Medicine

## 2018-05-19 ENCOUNTER — Other Ambulatory Visit: Payer: Self-pay

## 2018-05-19 DIAGNOSIS — R252 Cramp and spasm: Secondary | ICD-10-CM | POA: Diagnosis not present

## 2018-05-19 DIAGNOSIS — G8250 Quadriplegia, unspecified: Secondary | ICD-10-CM | POA: Insufficient documentation

## 2018-05-19 DIAGNOSIS — R262 Difficulty in walking, not elsewhere classified: Secondary | ICD-10-CM | POA: Diagnosis not present

## 2018-05-19 NOTE — Telephone Encounter (Signed)
[facility] form dropped off for at front desk for completion.  Verified that patient section of form has been completed.  Last DOS/WCC with PCP was 04/07/18.  Placed form in team folder to be completed by clinical staff.  Crista Luria

## 2018-05-19 NOTE — Therapy (Signed)
[place], [place], [address] Phone: [phone]   Fax:  [phone]  Physical Therapy Evaluation  Patient Details  Name: Andrew Sullivan MRN: [id number] Date of Birth: 04-21-1970 Referring Provider (PT): Harriet Butte, DO   Encounter Date: 05/19/2018  PT End of Session - 05/19/18 1421    Visit Number  1    Number of Visits  10    Date for PT Re-Evaluation  06/25/18    Authorization Type  [employer] MCR/ MCD    PT Start Time  0130    PT Stop Time  0300    PT Time Calculation (min)  90 min    Activity Tolerance  Patient tolerated treatment well    Behavior During Therapy  [facility] for tasks assessed/performed       Past Medical History:  Diagnosis Date  . Erectile dysfunction   . Gross hematuria   . History of recurrent UTIs   . History of spinal cord injury    62 (age 21 fell from ladder) w/ cervical spine fx  s/p  c4 -- c6  fusion--  residual paraplegic bilateral legs (great below T8 uses w/c and can transfer self  . Paraparesis of both lower limbs ([place])    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 52 in 44  . Spastic tetraplegia ([place])     Past Surgical History:  Procedure Laterality Date  . CERVICAL FUSION  1988   C4 -- C6 w/ spinal cord injury  . CYSTOSCOPY WITH RETROGRADE PYELOGRAM, URETEROSCOPY AND STENT PLACEMENT Left 12/27/2013   Procedure: CYSTOSCOPY WITH RETROGRADE PYELOGRAM, URETEROSCOPY AND STENT PLACEMENT;  Surgeon: Reece Packer, MD;  Location: [facility];  Service: Urology;  Laterality: Left;  . CYSTOSCOPY/RETROGRADE/URETEROSCOPY Left 01/09/2014   Procedure: CYSTOSCOPY/RETROGRADE/URETEROSCOPY, URETERAL BIOPSY AND STENT EXCHANGE;  Surgeon: Alexis Frock, MD;  [facility];  Service: Urology;  Laterality: Left;  . HIP ARTHROPLASTY  08/12/2011   Procedure: ARTHROPLASTY BIPOLAR HIP;  Surgeon: Mauri Pole, MD;  Location[facility];  Service: Orthopedics;   Laterality: Left;  Hemi-arthroplasty    There were no vitals filed for this visit.   Subjective Assessment - 05/19/18 1339    Subjective  Returns due to pain and spasm posterior RT and LT leg .   Pain with and walk and stand.     Pertinent History  LT THA 2013    Limitations  Walking    Patient Stated Goals  Decr pain and walk better.     Currently in Pain?  No/denies    at rest   Pain Score  9    walking   Pain Location  --   thigh   Pain Orientation  Left;Right    Pain Descriptors / Indicators  Tightness;Spasm    Pain Type  Chronic pain    Pain Onset  More than a month ago    Pain Frequency  Intermittent    Aggravating Factors   walking and transfers    Pain Relieving Factors  sit rest, taking baclofen         [facility] PT Assessment - 05/19/18 0001      Assessment   Medical Diagnosis  Quadraplegic with Le spasm    Referring Provider (PT)  Harriet Butte, DO    Onset Date/Surgical Date  --   INCR spasm /pain in legs 2 months ago   Next MD Visit  As needed    Prior Therapy  3 years ago  Precautions   Precaution Comments  fall risk with walking withRW      Restrictions   Weight Bearing Restrictions  No      Balance Screen   Has the patient fallen in the past 6 months  Yes    How many times?  7   walking   Has the patient had a decrease in activity level because of a fear of falling?   Yes    Is the patient reluctant to leave their home because of a fear of falling?   No      Prior Function   Level of Independence  Requires assistive device for independence;Needs assistance with ADLs;Needs assistance with homemaking    Vocation  On disability      Cognition   Overall Cognitive Status  Within Functional Limits for tasks assessed      ROM / Strength   AROM / PROM / Strength  PROM;Strength      PROM   Overall PROM Comments  inprone he had normal hip ROM on RT due to decreased tone,  On LT   ER 50 degres and IR 35 degrees stiff due to THA in past    PROM  Assessment Site  Hip    Right/Left Hip  Right;Left    Right Hip External Rotation   20    Right Hip Internal Rotation   20    Left Hip Extension  5    Left Hip Flexion  85    Left Hip External Rotation   30    Left Hip Internal Rotation   10    Left Hip ABduction  10    Left Hip ADduction  20      Strength   Overall Strength Comments  No functional muscle strength except to lift both LE to seing legs on and off mat.  Marland Kitchen Spastic ,   tone affects ability to walk       Right Hip   Right Hip Extension  5    Right Hip Flexion  90    Right Hip ABduction  10    Right Hip ADduction  20      Flexibility   Soft Tissue Assessment /Muscle Length  yes    Hamstrings  30 degrees passive bilaterall with increased extensor andadductor tone      Ambulation/Gait   Gait Comments  RW with flexed LE and trunk   Decr weight bilaterally,essentially holding self with arms. spasm at timse flexed LE                 Objective measurements completed on examination: See above findings.              PT Education - 05/19/18 1503    Education Details  POC , suggested he lye prone 1-2x/day to decrease overall LE tone    Person(s) Educated  Patient    Methods  Explanation    Comprehension  Verbalized understanding       PT Short Term Goals - 05/19/18 1506      PT SHORT TERM GOAL #1   Title  demonstrates updated HEP correctly     Time  2    Period  Weeks    Status  New        PT Long Term Goals - 05/19/18 1507      PT LONG TERM GOAL #1   Title  verbalizes ongoing progressive HEP including fitness program at fitness club.if he is a  member    Time  5    Period  Weeks    Status  New      PT LONG TERM GOAL #2   Title  reports pain with gait /standing in hamstrings </= 2/10     Time  5    Period  Weeks    Status  New      PT LONG TERM GOAL #3   Title  reaches 6" in standing with RW support safely.     Time  5    Period  Weeks    Status  New      PT LONG TERM GOAL #4    Title  ambulates 200' with RW safely modified independent. to demo decr pain and incr function    Time  5    Period  Weeks    Status  New      PT LONG TERM GOAL #5   Title  Gait Velocity >0.5 ft/sec     Time  5    Period  Weeks    Status  New             Plan - 05/19/18 1422    Clinical Impression Statement  PAin with strethcing hamstrings. Duration of 2 months  onset. Affects walking and general discomfort.  In past stretching helped ease pain and spasm.  He walked 50 feet x 2 to get back into gym. Skilled PT should help with pain and spasm but in long run something medically may be needed to better control his tone    Personal Factors and Comorbidities  Past/Current Experience;Time since onset of injury/illness/exacerbation;Fitness    Examination-Activity Limitations  Bed Mobility;Transfers;Sit;Locomotion Level    Examination-Participation Restrictions  Community Activity    Stability/Clinical Decision Making  Unstable/Unpredictable    Clinical Decision Making  Moderate    Rehab Potential  Good    PT Frequency  2x / week    PT Duration  --   5 weeks   PT Treatment/Interventions  Passive range of motion;Manual techniques;Patient/family education;Therapeutic exercise;Moist Heat    PT Next Visit Plan  ROM LE, measure gait velocity and reaching distance in standing    PT Home Exercise Plan  lye prone during day if able 10-20 min    Consulted and Agree with Plan of Care  Patient       Patient will benefit from skilled therapeutic intervention in order to improve the following deficits and impairments:  Abnormal gait, Decreased strength, Increased muscle spasms, Impaired tone, Pain, Decreased range of motion  Visit Diagnosis: Cramp and spasm  Difficulty in walking, not elsewhere classified  Chronic spastic tetraplegia [facility])     Problem List Patient Active Problem List   Diagnosis Date Noted  . Erectile dysfunction   . Spastic tetraplegia ([place]) 12/06/2013  .  Normocytic anemia 11/05/2013  . History of recurrent UTIs 05/19/2010  . Functional quadriplegia ([place]) 05/10/2009    [name]  PT 05/19/2018, 3:11 PM  [facility] [address] [place], [place], [address] Phone: [phone]   Fax:  [phone]  Name: Andrew Sullivan MRN: [id number] Date of Birth: July 07, 1970

## 2018-05-20 NOTE — Telephone Encounter (Signed)
Reviewed form and filled out clinical portion with office stamp.   Placed in PCP's box for completion.  Ozella Almond, [place]

## 2018-05-21 NOTE — Telephone Encounter (Signed)
Informed patient that paperwork was ready for pick up and patient informed me that he wanted it mailed out.  I will mail it to him.  Ozella Almond, [place]

## 2018-05-21 NOTE — Telephone Encounter (Signed)
Form completed and left in RN box. Attached is last office visit, BMET, CBC, and HIV status.

## 2018-06-01 ENCOUNTER — Ambulatory Visit: Payer: Medicare HMO

## 2018-06-03 ENCOUNTER — Ambulatory Visit: Payer: Medicare HMO | Admitting: Physical Therapy

## 2018-06-07 ENCOUNTER — Ambulatory Visit: Payer: Medicare HMO

## 2018-06-09 ENCOUNTER — Telehealth: Payer: Self-pay | Admitting: Physical Therapy

## 2018-06-09 NOTE — Telephone Encounter (Signed)
Andrew Sullivan was contacted today regarding the temporary reduction of OP Rehab Services due to concerns for community transmission of Covid-19.    Therapist advised the patient to continue to perform their HEP and assured they had no unanswered questions at this time.  The patient was offered and declined the continuation in their POC by using methods such as an e-visit, virtual check in, or telehealth visit.    Outpatient Rehabilitation Services will follow up with this client when we are able to safely resume care at the [facility] in person.   Patient is aware we can be reached by telephone during limited business hours in the meantime.

## 2018-06-10 ENCOUNTER — Ambulatory Visit: Payer: Medicare HMO

## 2018-06-14 ENCOUNTER — Ambulatory Visit: Payer: Medicare HMO

## 2018-06-16 ENCOUNTER — Ambulatory Visit: Payer: Medicare HMO

## 2018-10-11 ENCOUNTER — Other Ambulatory Visit: Payer: Self-pay

## 2018-10-11 ENCOUNTER — Ambulatory Visit (INDEPENDENT_AMBULATORY_CARE_PROVIDER_SITE_OTHER): Payer: Medicare HMO | Admitting: Family Medicine

## 2018-10-11 DIAGNOSIS — G8250 Quadriplegia, unspecified: Secondary | ICD-10-CM

## 2018-10-11 DIAGNOSIS — R532 Functional quadriplegia: Secondary | ICD-10-CM

## 2018-10-11 MED ORDER — BACLOFEN 20 MG PO TABS: 20.0000 mg | ORAL_TABLET | Freq: Four times a day (QID) | ORAL | 5 refills | Status: DC

## 2018-10-11 NOTE — Progress Notes (Signed)
No charge as he forgot to bring his [employer] recertification form with him. I informed the patient he could drop them off without an appointment and I will be happy to fill them out when they arrive.  No charge. Refilled baclofen.

## 2018-10-13 ENCOUNTER — Other Ambulatory Visit: Payer: Self-pay

## 2018-10-13 ENCOUNTER — Encounter: Payer: Self-pay | Admitting: Family Medicine

## 2018-10-13 ENCOUNTER — Ambulatory Visit (INDEPENDENT_AMBULATORY_CARE_PROVIDER_SITE_OTHER): Payer: Medicare HMO | Admitting: Family Medicine

## 2018-10-13 VITALS — BP 118/72 | HR 71

## 2018-10-13 DIAGNOSIS — M7989 Other specified soft tissue disorders: Secondary | ICD-10-CM

## 2018-10-13 DIAGNOSIS — I82431 Acute embolism and thrombosis of right popliteal vein: Secondary | ICD-10-CM

## 2018-10-13 MED ORDER — IBUPROFEN 600 MG PO TABS: 600.0000 mg | ORAL_TABLET | Freq: Three times a day (TID) | ORAL | 0 refills | Status: DC | PRN

## 2018-10-13 MED ORDER — CAPSAICIN 0.035 % EX CREA: 1.0000 "application " | TOPICAL_CREAM | Freq: Three times a day (TID) | CUTANEOUS | 0 refills | Status: DC | PRN

## 2018-10-13 NOTE — Patient Instructions (Addendum)
It was great to see you today! Thank you for letting me participate in your care!  Today, we discussed your right leg pain. I believe it is most likely a muscle spasm issue. Continue to use baclofen as prescribed. I have also prescribed a cream and some Ibuprofen to help with pain and inflammation.  I have ordered an ultrasound of your right leg to rule out a DVT. I do not think you have a blood clot but I want to make sure.    Be well, Harolyn Rutherford, DO PGY-3, [facility]

## 2018-10-13 NOTE — Progress Notes (Signed)
     Subjective: Chief Complaint  Patient presents with  . forms     HPI: DRU PRIMEAU is a 48 y.o. presenting to clinic today to discuss the following:  Right Leg Pain Patient states for the past 2 weeks he has had right leg swelling and "tightness". It started out light but has increased steadily over the past two weeks. Patient is wheelchair bound due to old spinal cord injury. He has no history of DVT and the pain is located at the back of the knee and radiates down to his calf, described as constant, with nothing really making it better or worse. The skin is not discolored, warm, nor are there any rashes in the area. He denies fever, chills, SOB, pleuritic chest pain, no pitting edema.     ROS noted in HPI.   Past Medical, Surgical, Social, and Family History Reviewed & Updated per EMR.   Pertinent Historical Findings include:   Social History   Tobacco Use  Smoking Status Former Smoker  . Packs/day: 0.25  . Years: 0.50  . Pack years: 0.12  . Types: Cigarettes  . Quit date: 12/27/1986  . Years since quitting: 31.8  Smokeless Tobacco Never Used    Objective: BP 118/72   Pulse 71   SpO2 97%  Vitals and nursing notes reviewed  Physical Exam Gen: Alert and Oriented x 3, NAD CV: RRR, no murmurs, normal S1, S2 split Resp: CTAB, no wheezing, rales, or rhonchi, comfortable work of breathing Abd: non-distended, non-tender, soft, +bs in all four quadrants MSK: Positive Homan's sign on the right; no erythema, tender to palpation in the popliteal fossa and calf, +1 non-pitting edema on the right LE Skin: warm, dry, intact, no rashes  Imaging [place] of RLE  Right: Findings consistent with acute deep vein thrombosis involving the right femoral vein, right popliteal vein, right posterior tibial veins, and right peroneal veins. No cystic structure found in the popliteal fossa.  Assessment/Plan:  DVT (deep venous thrombosis) (HCC) U/S confirmed presence of DVT in right  femoral, popliteal, and posterior tibial veins and right peroneal vein. - Xarelto 15mg  BID for 21 days; then 20mg  daily for the duration for a total of 3 months of treatment. - Discussed signs and symptoms of PE and to seek emergency care immediately.  PATIENT EDUCATION PROVIDED: See AVS    Diagnosis and plan along with any newly prescribed medication(s) were discussed in detail with this patient today. The patient verbalized understanding and agreed with the plan. Patient advised if symptoms worsen return to clinic or ER.   No orders of the defined types were placed in this encounter.   Meds ordered this encounter  Medications  . DISCONTD: ibuprofen (ADVIL) 600 MG tablet    Sig: Take 1 tablet (600 mg total) by mouth every 8 (eight) hours as needed.    Dispense:  30 tablet    Refill:  0  . Capsaicin 0.035 % CREA    Sig: Apply 1 application topically 3 (three) times daily as needed.    Dispense:  42.5 g    Refill:  0     Harolyn Rutherford, DO 10/13/2018, 3:22 PM PGY-3 [place]

## 2018-10-14 ENCOUNTER — Ambulatory Visit (INDEPENDENT_AMBULATORY_CARE_PROVIDER_SITE_OTHER): Payer: Medicare HMO | Admitting: Family Medicine

## 2018-10-14 ENCOUNTER — Telehealth: Payer: Self-pay | Admitting: Family Medicine

## 2018-10-14 ENCOUNTER — Ambulatory Visit (HOSPITAL_COMMUNITY)
Admission: RE | Admit: 2018-10-14 | Discharge: 2018-10-14 | Disposition: A | Discharge status: Home / Self Care | Payer: Medicare HMO | Source: Ambulatory Visit | Attending: Family Medicine | Admitting: Family Medicine

## 2018-10-14 VITALS — BP 118/70 | HR 72 | Temp 98.5°F

## 2018-10-14 DIAGNOSIS — M7989 Other specified soft tissue disorders: Secondary | ICD-10-CM | POA: Diagnosis not present

## 2018-10-14 DIAGNOSIS — I824Y1 Acute embolism and thrombosis of unspecified deep veins of right proximal lower extremity: Secondary | ICD-10-CM

## 2018-10-14 MED ORDER — RIVAROXABAN 15 MG PO TABS: 15.0000 mg | ORAL_TABLET | Freq: Two times a day (BID) | ORAL | 0 refills | Status: DC

## 2018-10-14 NOTE — Telephone Encounter (Signed)
Pt is calling because he forgot to let Dr. Garlan Fillers know that he needs a new referral to [facility], [place]. His last has expired. jw

## 2018-10-14 NOTE — Progress Notes (Signed)
     Subjective:  HPI: Andrew Sullivan is a 48 y.o. presenting to clinic today to discuss the following:  DVT F/u Patient comes directly from [place] following his positive ultrasound scan for DVT. We discussed that he will immediately go to the pharmacy and begin taking Xarelto for his DVT. He knows dosages and that after 21 days he will go from 15mg  BID to 20mg  once daily for 3 months total. He will follow up with me in 3 weeks.    ROS noted in HPI.   Past Medical, Surgical, Social, and Family History Reviewed & Updated per EMR.   Pertinent Historical Findings include:   Social History   Tobacco Use  Smoking Status Former Smoker  . Packs/day: 0.25  . Years: 0.50  . Pack years: 0.12  . Types: Cigarettes  . Quit date: 12/27/1986  . Years since quitting: 31.8  Smokeless Tobacco Never Used    Objective: BP 118/70   Pulse 72   Temp 98.5 F (36.9 C) (Oral)   SpO2 97%  Vitals and nursing notes reviewed  Physical Exam Gen: Alert and Oriented x 3, NAD CV: RRR, no murmurs, normal S1, S2 split Resp: CTAB, no wheezing, rales, or rhonchi, comfortable work of breathing Ext: no clubbing, cyanosis, moderate swelling of right LE Skin: warm, dry, intact, no rashes  Assessment/Plan:  DVT (deep venous thrombosis) (HCC) Starting Xarelto 15mg  BID for 3 weeks; then 20mg  daily for total of 3 months of treatment - F/u in 3 weeks to start 20mg  of Xarelto   PATIENT EDUCATION PROVIDED: See AVS    Diagnosis and plan along with any newly prescribed medication(s) were discussed in detail with this patient today. The patient verbalized understanding and agreed with the plan. Patient advised if symptoms worsen return to clinic or ER.   No orders of the defined types were placed in this encounter.   Meds ordered this encounter  Medications  . Rivaroxaban (XARELTO) 15 MG TABS tablet    Sig: Take 1 tablet (15 mg total) by mouth 2 (two) times daily with a meal for 21 days.  With a meal    Dispense:  42 tablet    Refill:  0     Tim Garlan Fillers, DO 10/14/2018, 11:17 AM PGY-3 [place]

## 2018-10-14 NOTE — Patient Instructions (Signed)
It was great to see you today! Thank you for letting me participate in your care!  Today, we discussed your diagnosis of having a blood clot. I have started you on a new medication called Xarelto. It will breakdown the blood clot in your body. Please take it as prescribed for the next 3 weeks. After 3 weeks you will take one pill once per day for the next 3 months.  If you ever develop shortness of breath or difficulty breathing please seek care immediately.   Be well, Harolyn Rutherford, DO PGY-3, [facility]   Deep Vein Thrombosis  Deep vein thrombosis (DVT) is a condition in which a blood clot forms in a deep vein, such as a lower leg, thigh, or arm vein. A clot is blood that has thickened into a gel or solid. This condition is dangerous. It can lead to serious and even life-threatening complications if the clot travels to the lungs and causes a blockage (pulmonary embolism). It can also damage veins in the leg. This can result in leg pain, swelling, discoloration, and sores (post-thrombotic syndrome). What are the causes? This condition may be caused by:  A slowdown of blood flow.  Damage to a vein.  A condition that causes blood to clot more easily, such as an inherited clotting disorder. What increases the risk? The following factors may make you more likely to develop this condition:  Being overweight.  Being older, especially over age 91.  Sitting or lying down for more than four hours.  Being in the hospital.  Lack of physical activity (sedentary lifestyle).  Pregnancy, being in childbirth, or having recently given birth.  Taking medicines that contain estrogen, such as medicines to prevent pregnancy.  Smoking.  A history of any of the following: ? Blood clots or a blood clotting disease. ? Peripheral vascular disease. ? Inflammatory bowel disease. ? Cancer. ? Heart disease. ? Genetic conditions that affect how your blood clots, such as Factor V Leiden  mutation. ? Neurological diseases that affect your legs (leg paresis). ? A recent injury, such as a car accident. ? Major or lengthy surgery. ? A central line placed inside a large vein. What are the signs or symptoms? Symptoms of this condition include:  Swelling, pain, or tenderness in an arm or leg.  Warmth, redness, or discoloration in an arm or leg. If the clot is in your leg, symptoms may be more noticeable or worse when you stand or walk. Some people may not develop any symptoms. How is this diagnosed? This condition is diagnosed with:  A medical history and physical exam.  Tests, such as: ? Blood tests. These are done to check how well your blood clots. ? Ultrasound. This is done to check for clots. ? Venogram. For this test, contrast dye is injected into a vein and X-rays are taken to check for any clots. How is this treated? Treatment for this condition depends on:  The cause of your DVT.  Your risk for bleeding or developing more clots.  Any other medical conditions that you have. Treatment may include:  Taking a blood thinner (anticoagulant). This type of medicine prevents clots from forming. It may be taken by mouth, injected under the skin, or injected through an IV (catheter).  Injecting clot-dissolving medicines into the affected vein (catheter-directed thrombolysis).  Having surgery. Surgery may be done to: ? Remove the clot. ? Place a filter in a large vein to catch blood clots before they reach the lungs.  Some treatments may be continued for up to six months. Follow these instructions at home: If you are taking blood thinners:  Take the medicine exactly as told by your health care provider. Some blood thinners need to be taken at the same time every day. Do not skip a dose.  Talk with your health care provider before you take any medicines that contain aspirin or NSAIDs. These medicines increase your risk for dangerous bleeding.  Ask your health care  provider about foods and drugs that could change the way the medicine works (may interact). Avoid those things if your health care provider tells you to do so.  Blood thinners can cause easy bruising and may make it difficult to stop bleeding. Because of this: ? Be very careful when using knives, scissors, or other sharp objects. ? Use an electric razor instead of a blade. ? Avoid activities that could cause injury or bruising, and follow instructions about how to prevent falls.  Wear a medical alert bracelet or carry a card that lists what medicines you take. General instructions  Take over-the-counter and prescription medicines only as told by your health care provider.  Return to your normal activities as told by your health care provider. Ask your health care provider what activities are safe for you.  Wear compression stockings if recommended by your health care provider.  Keep all follow-up visits as told by your health care provider. This is important. How is this prevented? To lower your risk of developing this condition again:  For 30 or more minutes every day, do an activity that: ? Involves moving your arms and legs. ? Increases your heart rate.  When traveling for longer than four hours: ? Exercise your arms and legs every hour. ? Drink plenty of water. ? Avoid drinking alcohol.  Avoid sitting or lying for a long time without moving your legs.  If you have surgery or you are hospitalized, ask about ways to prevent blood clots. These may include taking frequent walks or using anticoagulants.  Stay at a healthy weight.  If you are a woman who is older than age 45, avoid unnecessary use of medicines that contain estrogen, such as some birth control pills.  Do not use any products that contain nicotine or tobacco, such as cigarettes and e-cigarettes. This is especially important if you take estrogen medicines. If you need help quitting, ask your health care provider.  Contact a health care provider if:  You miss a dose of your blood thinner.  Your menstrual period is heavier than usual.  You have unusual bruising. Get help right away if:  You have: ? New or increased pain, swelling, or redness in an arm or leg. ? Numbness or tingling in an arm or leg. ? Shortness of breath. ? Chest pain. ? A rapid or irregular heartbeat. ? A severe headache or confusion. ? A cut that will not stop bleeding.  There is blood in your vomit, stool, or urine.  You have a serious fall or accident, or you hit your head.  You feel light-headed or dizzy.  You cough up blood. These symptoms may represent a serious problem that is an emergency. Do not wait to see if the symptoms will go away. Get medical help right away. Call your local emergency services (911 in the [place] Do not drive yourself to the hospital. Summary  Deep vein thrombosis (DVT) is a condition in which a blood clot forms in a deep vein, such as a  lower leg, thigh, or arm vein.  Symptoms can include swelling, warmth, pain, and redness in your leg or arm.  This condition may be treated with a blood thinner (anticoagulant medicine), medicine that is injected to dissolve blood clots,compression stockings, or surgery.  If you are prescribed blood thinners, take them exactly as told. This information is not intended to replace advice given to you by your health care provider. Make sure you discuss any questions you have with your health care provider. Document Released: 02/24/2005 Document Revised: 02/06/2017 Document Reviewed: 07/25/2016 Elsevier Patient Education  2020 [employer].

## 2018-10-15 ENCOUNTER — Telehealth: Payer: Self-pay

## 2018-10-15 NOTE — Telephone Encounter (Signed)
Received fax from pharmacy requesting an alternative for Capsaicin 0.035% cream, as it is not in stock. Please advise.

## 2018-10-19 ENCOUNTER — Other Ambulatory Visit: Payer: Self-pay | Admitting: Family Medicine

## 2018-10-19 DIAGNOSIS — I82409 Acute embolism and thrombosis of unspecified deep veins of unspecified lower extremity: Secondary | ICD-10-CM | POA: Insufficient documentation

## 2018-10-19 NOTE — Assessment & Plan Note (Signed)
Starting Xarelto 15mg  BID for 3 weeks; then 20mg  daily for total of 3 months of treatment - F/u in 3 weeks to start 20mg  of Xarelto

## 2018-10-19 NOTE — Progress Notes (Unsigned)
refer

## 2018-10-19 NOTE — Telephone Encounter (Signed)
Will route to [employer] on [address]  who handles the scheduling for [facility].  Ozella Almond, [place]

## 2018-10-19 NOTE — Assessment & Plan Note (Signed)
U/S confirmed presence of DVT in right femoral, popliteal, and posterior tibial veins and right peroneal vein. - Xarelto 15mg  BID for 21 days; then 20mg  daily for the duration for a total of 3 months of treatment. - Discussed signs and symptoms of PE and to seek emergency care immediately.

## 2018-10-20 NOTE — Telephone Encounter (Signed)
Ok if you can send me a message in her chart.  Jamarii Banks,CMA

## 2018-10-20 NOTE — Telephone Encounter (Signed)
What concerns are you having for this patient, that would warrant a geri appt?  Andrew Sullivan,CMA

## 2018-10-27 ENCOUNTER — Emergency Department (HOSPITAL_COMMUNITY)
Admission: EM | Admit: 2018-10-27 | Discharge: 2018-10-27 | Disposition: A | Discharge status: Home / Self Care | Payer: Medicare HMO | Attending: Emergency Medicine | Admitting: Emergency Medicine

## 2018-10-27 ENCOUNTER — Emergency Department (HOSPITAL_COMMUNITY): Payer: Medicare HMO

## 2018-10-27 ENCOUNTER — Encounter (HOSPITAL_COMMUNITY): Payer: Self-pay | Admitting: Emergency Medicine

## 2018-10-27 ENCOUNTER — Other Ambulatory Visit: Payer: Self-pay

## 2018-10-27 DIAGNOSIS — U071 COVID-19: Secondary | ICD-10-CM | POA: Diagnosis not present

## 2018-10-27 DIAGNOSIS — R519 Headache, unspecified: Secondary | ICD-10-CM

## 2018-10-27 DIAGNOSIS — Z87891 Personal history of nicotine dependence: Secondary | ICD-10-CM | POA: Diagnosis not present

## 2018-10-27 DIAGNOSIS — R51 Headache: Secondary | ICD-10-CM | POA: Insufficient documentation

## 2018-10-27 DIAGNOSIS — R509 Fever, unspecified: Secondary | ICD-10-CM | POA: Diagnosis not present

## 2018-10-27 DIAGNOSIS — Z79899 Other long term (current) drug therapy: Secondary | ICD-10-CM | POA: Insufficient documentation

## 2018-10-27 DIAGNOSIS — J984 Other disorders of lung: Secondary | ICD-10-CM | POA: Diagnosis not present

## 2018-10-27 LAB — URINALYSIS, ROUTINE W REFLEX MICROSCOPIC
Bilirubin Urine: NEGATIVE
Glucose, UA: NEGATIVE mg/dL
Ketones, ur: NEGATIVE mg/dL
Leukocytes,Ua: NEGATIVE
Nitrite: NEGATIVE
Protein, ur: NEGATIVE mg/dL
Specific Gravity, Urine: 1.018 (ref 1.005–1.030)
pH: 5 (ref 5.0–8.0)

## 2018-10-27 LAB — CBC WITH DIFFERENTIAL/PLATELET
Abs Immature Granulocytes: 0.01 10*3/uL (ref 0.00–0.07)
Basophils Absolute: 0 10*3/uL (ref 0.0–0.1)
Basophils Relative: 0 %
Eosinophils Absolute: 0 10*3/uL (ref 0.0–0.5)
Eosinophils Relative: 0 %
HCT: 36.4 % — ABNORMAL LOW (ref 39.0–52.0)
Hemoglobin: 10.9 g/dL — ABNORMAL LOW (ref 13.0–17.0)
Immature Granulocytes: 0 %
Lymphocytes Relative: 26 %
Lymphs Abs: 0.7 10*3/uL (ref 0.7–4.0)
MCH: 26.1 pg (ref 26.0–34.0)
MCHC: 29.9 g/dL — ABNORMAL LOW (ref 30.0–36.0)
MCV: 87.1 fL (ref 80.0–100.0)
Monocytes Absolute: 0.5 10*3/uL (ref 0.1–1.0)
Monocytes Relative: 20 %
Neutro Abs: 1.4 10*3/uL — ABNORMAL LOW (ref 1.7–7.7)
Neutrophils Relative %: 54 %
Platelets: 192 10*3/uL (ref 150–400)
RBC: 4.18 MIL/uL — ABNORMAL LOW (ref 4.22–5.81)
RDW: 12.7 % (ref 11.5–15.5)
WBC: 2.6 10*3/uL — ABNORMAL LOW (ref 4.0–10.5)
nRBC: 0 % (ref 0.0–0.2)

## 2018-10-27 LAB — BASIC METABOLIC PANEL
Anion gap: 8 (ref 5–15)
BUN: 18 mg/dL (ref 6–20)
CO2: 25 mmol/L (ref 22–32)
Calcium: 8.4 mg/dL — ABNORMAL LOW (ref 8.9–10.3)
Chloride: 103 mmol/L (ref 98–111)
Creatinine, Ser: 1.29 mg/dL — ABNORMAL HIGH (ref 0.61–1.24)
GFR calc Af Amer: >60 mL/min (ref >60)
GFR calc non Af Amer: >60 mL/min (ref >60)
Glucose, Bld: 97 mg/dL (ref 70–99)
Potassium: 4.6 mmol/L (ref 3.5–5.1)
Sodium: 136 mmol/L (ref 135–145)

## 2018-10-27 MED ORDER — ACETAMINOPHEN 500 MG PO TABS
1000.0000 mg | ORAL_TABLET | Freq: Once | ORAL | Status: AC
  Administered 2018-10-27: 1000 mg via ORAL
  Filled 2018-10-27: qty 2

## 2018-10-27 MED ORDER — SODIUM CHLORIDE 0.9 % IV BOLUS
1000.0000 mL | Freq: Once | INTRAVENOUS | Status: AC
  Administered 2018-10-27: 1000 mL via INTRAVENOUS

## 2018-10-27 MED ORDER — DIPHENHYDRAMINE HCL 50 MG/ML IJ SOLN
25.0000 mg | Freq: Once | INTRAMUSCULAR | Status: AC
  Administered 2018-10-27: 25 mg via INTRAVENOUS
  Filled 2018-10-27: qty 1

## 2018-10-27 MED ORDER — PROCHLORPERAZINE EDISYLATE 10 MG/2ML IJ SOLN
10.0000 mg | Freq: Once | INTRAMUSCULAR | Status: AC
  Administered 2018-10-27: 10 mg via INTRAVENOUS
  Filled 2018-10-27: qty 2

## 2018-10-27 NOTE — ED Provider Notes (Signed)
[facility] EMERGENCY DEPARTMENT Provider Note   CSN: [id number] Arrival date & time: 10/27/18  1259    History   Chief Complaint Chief Complaint  Patient presents with   Headache    HPI Andrew Sullivan is a 48 y.o. male with history of old spinal cord injury, spastic tetraplegia, paraparesis of both lower limbs, functional quadriplegia, normocytic anemia, erectile dysfunction, DVT currently on Xarelto, and recurrent UTIs presents for evaluation of crush acute onset, persistent headache for 3 days.  He reports that symptoms began shortly after awakening on Monday around 6 AM.  He reports generalized but primarily frontal headache that he describes as a throbbing sensation since then.  It has been constant but waxing and waning, at its worst, 10/10 in severity, currently 5/10 in severity.  He denies any blurred vision, diplopia, photophobia, numbness of the extremities, neck pain or stiffness, chest pain, shortness of breath, cough, abdominal pain, nausea, vomiting, or urinary symptoms.  He has not tried anything for his symptoms.  No history of trauma or recent travel.  He was recently diagnosed with an acute DVT and started on Xarelto on 10/14/2018.     The history is provided by the patient.    Past Medical History:  Diagnosis Date   Erectile dysfunction    Gross hematuria    History of recurrent UTIs    History of spinal cord injury    9 (age 77 fell from ladder) w/ cervical spine fx  s/p  c4 -- c6  fusion--  residual paraplegic bilateral legs (great below T8 uses w/c and can transfer self   Paraparesis of both lower limbs (HCC)    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 46 in 71   Spastic tetraplegia [facility])     Patient Active Problem List   Diagnosis Date Noted   DVT (deep venous thrombosis) ([place]) 10/19/2018   Erectile dysfunction    Spastic tetraplegia ([place]) 12/06/2013   Normocytic anemia 11/05/2013   History of recurrent UTIs  05/19/2010   Functional quadriplegia ([place]) 05/10/2009    Past Surgical History:  Procedure Laterality Date   CERVICAL FUSION  1988   C4 -- C6 w/ spinal cord injury   CYSTOSCOPY WITH RETROGRADE PYELOGRAM, URETEROSCOPY AND STENT PLACEMENT Left 12/27/2013   Procedure: [place], URETEROSCOPY AND STENT PLACEMENT;  Surgeon: Reece Packer, MD;  Location: [facility];  Service: Urology;  Laterality: Left;   CYSTOSCOPY/RETROGRADE/URETEROSCOPY Left 01/09/2014   Procedure: CYSTOSCOPY/RETROGRADE/URETEROSCOPY, URETERAL BIOPSY AND STENT EXCHANGE;  Surgeon: Alexis Frock, MD;  Location: [facility];  Service: Urology;  Laterality: Left;   HIP ARTHROPLASTY  08/12/2011   Procedure: ARTHROPLASTY BIPOLAR HIP;  Surgeon: Mauri Pole, MD;  Location: [facility];  Service: Orthopedics;  Laterality: Left;  Hemi-arthroplasty        Home Medications    Prior to Admission medications   Medication Sig Start Date End Date Taking? Authorizing Provider  baclofen (LIORESAL) 20 MG tablet Take 1 tablet (20 mg total) by mouth 4 (four) times daily. 10/11/18  Yes Nuala Alpha, DO  Rivaroxaban (XARELTO) 15 MG TABS tablet Take 1 tablet (15 mg total) by mouth 2 (two) times daily with a meal for 21 days. With a meal 10/14/18 11/04/18 Yes Nuala Alpha, DO    Family History Family History  Problem Relation Age of Onset   Arthritis Mother    Hypertension Mother     Social History Social History   Tobacco Use   Smoking  status: Former Smoker    Packs/day: 0.25    Years: 0.50    Pack years: 0.12    Types: Cigarettes    Quit date: 12/27/1986    Years since quitting: 31.8   Smokeless tobacco: Never Used  Substance Use Topics   Alcohol use: No   Drug use: No     Allergies   Penicillins   Review of Systems Review of Systems  Constitutional: Positive for fever. Negative for chills.  Eyes: Negative for photophobia and visual disturbance.  Respiratory:  Negative for shortness of breath.   Cardiovascular: Negative for chest pain.  Gastrointestinal: Negative for abdominal pain, constipation, nausea and vomiting.  Musculoskeletal: Negative for neck pain and neck stiffness.  Neurological: Positive for headaches. Negative for numbness.  All other systems reviewed and are negative.    Physical Exam Updated Vital Signs BP 104/65 (BP Location: Right Arm)    Pulse 61    Temp 98 F (36.7 C) (Oral)    Resp 15    SpO2 99%   Physical Exam Vitals signs and nursing note reviewed.  Constitutional:      General: He is not in acute distress.    Appearance: He is well-developed.  HENT:     Head: Normocephalic and atraumatic.  Eyes:     General:        Right eye: No discharge.        Left eye: No discharge.     Extraocular Movements: Extraocular movements intact.     Right eye: Normal extraocular motion and no nystagmus.     Left eye: Normal extraocular motion and no nystagmus.     Conjunctiva/sclera: Conjunctivae normal.     Pupils: Pupils are equal, round, and reactive to light.  Neck:     Musculoskeletal: Normal range of motion and neck supple. No neck rigidity.     Vascular: No JVD.     Trachea: No tracheal deviation.     Meningeal: Brudzinski's sign and Kernig's sign absent.  Cardiovascular:     Rate and Rhythm: Normal rate and regular rhythm.  Pulmonary:     Effort: Pulmonary effort is normal.     Breath sounds: Normal breath sounds.  Abdominal:     General: Bowel sounds are normal. There is no distension.     Palpations: Abdomen is soft.  Skin:    General: Skin is warm and dry.     Findings: No erythema.  Neurological:     Mental Status: He is alert and oriented to person, place, and time.     GCS: GCS eye subscore is 4. GCS verbal subscore is 5. GCS motor subscore is 6.     Cranial Nerves: No cranial nerve deficit.     Sensory: No sensory deficit.     Motor: Weakness (lower extremity weakness bilaterally at patient's baseline  per the patient) present.     Coordination: Finger-Nose-Finger Test normal.  Psychiatric:        Behavior: Behavior normal.      ED Treatments / Results  Labs (all labs ordered are listed, but only abnormal results are displayed) Labs Reviewed  CBC WITH DIFFERENTIAL/PLATELET - Abnormal; Notable for the following components:      Result Value   WBC 2.6 (*)    RBC 4.18 (*)    Hemoglobin 10.9 (*)    HCT 36.4 (*)    MCHC 29.9 (*)    Neutro Abs 1.4 (*)    All other components within normal limits  URINALYSIS, ROUTINE W REFLEX MICROSCOPIC - Abnormal; Notable for the following components:   Hgb urine dipstick SMALL (*)    Bacteria, UA RARE (*)    All other components within normal limits  BASIC METABOLIC PANEL - Abnormal; Notable for the following components:   Creatinine, Ser 1.29 (*)    Calcium 8.4 (*)    All other components within normal limits  URINE CULTURE  NOVEL CORONAVIRUS, NAA (HOSPITAL ORDER, SEND-OUT TO REF LAB)    EKG EKG Interpretation  Date/Time:  Wednesday October 27 2018 17:46:04 EDT Ventricular Rate:  54 PR Interval:    QRS Duration: 82 QT Interval:  411 QTC Calculation: 390 R Axis:   86 Text Interpretation:  Sinus rhythm Borderline short PR interval Confirmed by Lennice Sites [id number]) on 10/27/2018 5:54:13 PM   Radiology Dg Chest Port 1 View  Result Date: 10/27/2018 CLINICAL DATA:  48 year old male with progressive frontal headache EXAM: PORTABLE CHEST 1 VIEW COMPARISON:  Prior chest x-ray 09/24/2011 FINDINGS: Cardiac and mediastinal contours remain within normal limits. The lungs are hyperinflated consistent with underlying COPD. No consolidation, pulmonary edema, pleural effusion or pneumothorax. No suspicious nodule or mass. Peaking of the right hemidiaphragm likely represents a region of prior scarring. No acute osseous abnormality. IMPRESSION: 1. No active cardiopulmonary process. 2. Pulmonary hyperinflation suggests underlying COPD. Electronically  Signed   By: Jacqulynn Cadet M.D.   On: 10/27/2018 16:55    Procedures Procedures (including critical care time)  Medications Ordered in ED Medications  sodium chloride 0.9 % bolus 1,000 mL (0 mLs Intravenous Stopped 10/27/18 2154)  acetaminophen (TYLENOL) tablet 1,000 mg (1,000 mg Oral Given 10/27/18 1700)  prochlorperazine (COMPAZINE) injection 10 mg (10 mg Intravenous Given 10/27/18 1701)  diphenhydrAMINE (BENADRYL) injection 25 mg (25 mg Intravenous Given 10/27/18 1701)     Initial Impression / Assessment and Plan / ED Course  I have reviewed the triage vital signs and the nursing notes.  Pertinent labs & imaging results that were available during my care of the patient were reviewed by me and considered in my medical decision making (see chart for details).        Victor Langenbach Chisom was evaluated in Emergency Department on 10/27/2018 for the symptoms described in the history of present illness. He was evaluated in the context of the global COVID-19 pandemic, which necessitated consideration that the patient might be at risk for infection with the SARS-CoV-2 virus that causes COVID-19. Institutional protocols and algorithms that pertain to the evaluation of patients at risk for COVID-19 are in a state of rapid change based on information released by regulatory bodies including the CDC and federal and state organizations. These policies and algorithms were followed during the patient's care in the ED.  Patient presenting for evaluation of low-grade fever and headache.  He had fever on initial assessment with improvement after the administration of Tylenol.  Headache is significantly improved and has been ongoing for the last 3 days.  He has no nuchal rigidity or meningeal signs have a low suspicion of bacterial meningitis given the duration of his symptoms and how overall well-appearing he is.  Chest x-ray shows no evidence of pneumonia or pleural effusion, no cardiomegaly.  EKG shows  normal sinus rhythm, no acute ischemic abnormalities.  Remainder of blood work shows a leukopenia though his white blood cell count tends to run low and he has been leukopenic previously.  Also shows mild anemia, mildly elevated creatinine but BUN is within normal limits and he has  been tolerating p.o. without difficulty at home so I doubt AKI.  UA does not suggest UTI.  He was given IV fluids and a migraine cocktail in the ED and on reevaluation reports his headache has entirely resolved and that he is feeling much better and would like to go home.  I think this is reasonable but given his leukopenia and fever we will test him for COVID.  He understands that his results will return in the next 48 hours or so.  Discussed quarantining at home per current CDC guidelines.  Recommend follow-up with PCP for reevaluation of symptoms.  Discussed that he could take Tylenol but not any NSAIDs given his anticoagulation due to his DVT.  Discussed strict ED return precautions. Pt verbalized understanding of and agreement with plan and is safe for discharge home at this time.  Patient seen and evaluated Dr. Ronnald Nian who agrees with assessment and plan at this time.  Final Clinical Impressions(s) / ED Diagnoses   Final diagnoses:  Fever in adult  Frontal headache    ED Discharge Orders    None       Renita Papa, PA-C 10/27/18 2213    Lennice Sites, DO 10/27/18 2355

## 2018-10-27 NOTE — ED Provider Notes (Signed)
Medical screening examination/treatment/procedure(s) were conducted as a shared visit with non-physician practitioner(s) and myself.  I personally evaluated the patient during the encounter. Briefly, the patient is a 48 y.o. male with history of paraplegia who presents the ED with frontal headache.  Headache for the last 3 days.  Neuro intact.  Incidentally found to have a fever upon arrival here.  Denies any cough, sputum production.  No signs to suggest meningitis.  Normal mentation.  No neck pain.  Patient denies any urinary symptoms.  No known coronavirus symptoms.  Patient recently started on blood thinner for DVT in his right leg.  Chest x-ray shows no signs of infection.  Urinalysis shows no signs of infection.  CT head showed no acute process.  No head bleed.  Patient felt better after headache cocktail, Tylenol.  Will swab for coronavirus as this is likely viral in nature.  As stated before no concern for meningitis.  Recommend self isolation at home.  Discharged from ED in good condition.  Given return precautions.  This chart was dictated using voice recognition software.  Despite best efforts to proofread,  errors can occur which can change the documentation meaning.     EKG Interpretation None          Lennice Sites, DO 10/27/18 2113

## 2018-10-27 NOTE — Discharge Instructions (Signed)
You can take 1 to 2 tablets of Tylenol (350mg -1000mg  depending on the dose) every 6 hours as needed for pain.  Do not exceed 4000 mg of Tylenol daily.  Do not take any ibuprofen, Advil, Aleve, or Motrin because you are taking Xarelto which is a blood thinner.  Drink plenty fluids and get plenty of rest.  Your COVID test will result in 48 hours.  You will receive a phone call if your test is positive, you will receive no phone call if your test is negative.  Please quarantine at home for at least 10 days since your symptoms began and at least 3 days fever free without the use of Tylenol.  Follow-up with your primary care provider for reevaluation of your symptoms.  Return to the emergency department if any concerning signs or symptoms develop such as severe headaches, persistent vomiting, shortness of breath, or chest pain.

## 2018-10-27 NOTE — ED Notes (Signed)
All appropriate discharge materials reviewed at length with patient. Time for questions provided. Pt has no other questions at this time and verbalizes understanding of all provided materials.  

## 2018-10-27 NOTE — ED Triage Notes (Signed)
Pt arrives to ED from home with complaints of a frontal headache since Monday that has worsened since. Denies any other pain at this time.

## 2018-10-28 LAB — URINE CULTURE: Culture: <10000 — AB

## 2018-10-29 LAB — NOVEL CORONAVIRUS, NAA (HOSP ORDER, SEND-OUT TO REF LAB; TAT 18-24 HRS): SARS-CoV-2, NAA: DETECTED — AB

## 2018-11-02 ENCOUNTER — Telehealth: Payer: Self-pay | Admitting: Family Medicine

## 2018-11-02 ENCOUNTER — Other Ambulatory Visit: Payer: Self-pay | Admitting: Family Medicine

## 2018-11-02 MED ORDER — RIVAROXABAN 20 MG PO TABS: 20.0000 mg | ORAL_TABLET | Freq: Every day | ORAL | 0 refills | Status: DC

## 2018-11-02 NOTE — Progress Notes (Signed)
Sending in Xarelto 20mg  daily after finishing 21 days of Xarelto 15mg  BID for DVT.  Will reassess need to continue anticoagulation in 3 months.

## 2018-11-02 NOTE — Telephone Encounter (Signed)
Pt is calling and asked to speak with Dr. Garlan Fillers. I informed him that he is seeing patients and I could send him a message. Pt said it was urgent and he would like to speak with Dr. Garlan Fillers asap. Dr. Garlan Fillers does not have any virtual appointments today to offer pt.   Pt said that Dr. Garlan Fillers told him it was okay to call and ask him to give him a call back when he needs something urgent. I asked pt if there was anything I could let Dr. Garlan Fillers know if the note and he said " I will only tell him when he calls me"   Please call pt to discuss.

## 2018-11-04 ENCOUNTER — Telehealth (INDEPENDENT_AMBULATORY_CARE_PROVIDER_SITE_OTHER): Payer: Medicare HMO | Admitting: Family Medicine

## 2018-11-04 ENCOUNTER — Other Ambulatory Visit: Payer: Self-pay

## 2018-11-04 DIAGNOSIS — I824Y1 Acute embolism and thrombosis of unspecified deep veins of right proximal lower extremity: Secondary | ICD-10-CM | POA: Diagnosis not present

## 2018-11-04 NOTE — Assessment & Plan Note (Signed)
Patient compliant with medications - Cont Xarelto 20mg  for a total of 9 weeks for complete 3 months of treatment for provoked DVT. - At that time patient will return to clinic to discuss benefits/risks of continuing anti-coagulation

## 2018-11-04 NOTE — Progress Notes (Signed)
[facility] Tele[facility]icine Visit  Patient consented to have virtual visit. Method of visit: Telephone  Encounter participants: Patient: Andrew Sullivan - located at home in [facility] Provider: Nuala Alpha - located at [facility] Others (if applicable): none  Chief Complaint: DVT follow up  HPI: No current leg pain or swelling. He states it is not tender and he has not difficulty breathing or shortness of breath. He states he was told to contact us to ensure we verified he picked up his new prescription of Xarelto for 20mg  once daily and is taking it. He did in fact pick it up and has taken it today.   ROS: per HPI  Pertinent PMHx: DVT, Paraplegia  Exam:  Respiratory: speaking in full sentences  Assessment/Plan:  DVT (deep venous thrombosis) (HCC) Patient compliant with medications - Cont Xarelto 20mg  for a total of 9 weeks for complete 3 months of treatment for provoked DVT. - At that time patient will return to clinic to discuss benefits/risks of continuing anti-coagulation    Time spent during visit with patient: >5 minutes  Harolyn Rutherford, DO [place], PGY-3

## 2018-11-10 ENCOUNTER — Telehealth: Payer: Self-pay | Admitting: Family Medicine

## 2018-11-10 NOTE — Telephone Encounter (Signed)
Pt called because he needs a  letter from the doctor explaining that he is handicap so that his apartment complex will build him a ramp. He needs this letter as soon as possible. Please call him with questions. [email or web address]

## 2018-11-16 ENCOUNTER — Other Ambulatory Visit: Payer: Self-pay | Admitting: Family Medicine

## 2018-11-16 NOTE — Progress Notes (Signed)
Letter for patient saying he is wheelchair bound for better access in apartment where he lives.

## 2019-01-24 ENCOUNTER — Other Ambulatory Visit: Payer: Self-pay | Admitting: Family Medicine

## 2019-01-24 ENCOUNTER — Telehealth: Payer: Self-pay | Admitting: Family Medicine

## 2019-01-24 MED ORDER — RIVAROXABAN 20 MG PO TABS: 20.0000 mg | ORAL_TABLET | Freq: Every day | ORAL | 1 refills | Status: DC

## 2019-01-24 NOTE — Telephone Encounter (Signed)
Pt is calling back to leave a better number for call back. [phone]

## 2019-01-24 NOTE — Progress Notes (Signed)
Patient requesting refill which is appropriate. He needs life long anticoagulation.

## 2019-01-24 NOTE — Telephone Encounter (Signed)
He missed our call, can you try him again at [phone].

## 2019-01-24 NOTE — Telephone Encounter (Signed)
The patient wanted to speak to his pcp personally, about some med refills, before his appointment tomorrow.  Please call him at [phone]

## 2019-01-25 ENCOUNTER — Other Ambulatory Visit: Payer: Self-pay

## 2019-01-25 ENCOUNTER — Other Ambulatory Visit: Payer: Self-pay | Admitting: Family Medicine

## 2019-01-25 ENCOUNTER — Ambulatory Visit (INDEPENDENT_AMBULATORY_CARE_PROVIDER_SITE_OTHER): Payer: Medicare HMO | Admitting: Family Medicine

## 2019-01-25 VITALS — BP 125/80 | HR 85

## 2019-01-25 DIAGNOSIS — R532 Functional quadriplegia: Secondary | ICD-10-CM | POA: Diagnosis not present

## 2019-01-25 NOTE — Progress Notes (Signed)
     Subjective: HPI: Andrew Sullivan is a 48 y.o. presenting to clinic today to discuss the following:  Evaluation for need for scooter Patient is a 48y/o with functional quadriplegia and hx of DVT presenting today for evaluation to determine if he will qualify for a scooter. Patient is having increasing difficulty getting using his manual wheelchair and getting blisters on his hands.  ROS noted in HPI.    Social History   Tobacco Use  Smoking Status Former Smoker  . Packs/day: 0.25  . Years: 0.50  . Pack years: 0.12  . Types: Cigarettes  . Quit date: 12/27/1986  . Years since quitting: 32.1  Smokeless Tobacco Never Used    Objective: BP 125/80   Pulse 85   SpO2 100%  Vitals and nursing notes reviewed  Physical Exam Gen: Alert and Oriented x 3, NAD CV: RRR, no murmurs, normal S1, S2 split Resp: CTAB, no wheezing, rales, or rhonchi, comfortable work of breathing MSK: wheel chair bound, 5/5 strength in upper extremities bilaterally Ext: no clubbing, cyanosis, or edema Skin: warm, dry, intact, no rashes   Assessment/Plan:  Functional quadriplegia (HCC) Patient states he is getting paperwork faxed to [place] for evaluation to see if he will qualify for a power wheelchair.  - After discussion with Dr. McDiarmid he recommended a referral to Neuro rehad for wheel chair assessment.   PATIENT EDUCATION PROVIDED: See AVS    Diagnosis and plan along with any newly prescribed medication(s) were discussed in detail with this patient today. The patient verbalized understanding and agreed with the plan. Patient advised if symptoms worsen return to clinic or ER.    Orders Placed This Encounter  Procedures  . Ambulatory Referral to Neuro Rehab    Referral Priority:   Routine    Referral Type:   Consultation    Number of Visits Requested:   [place], DO 01/25/2019, 1:38 PM PGY-3 [place]

## 2019-01-26 ENCOUNTER — Telehealth: Payer: Self-pay | Admitting: Family Medicine

## 2019-01-26 ENCOUNTER — Other Ambulatory Visit: Payer: Self-pay | Admitting: Family Medicine

## 2019-01-26 MED ORDER — RIVAROXABAN 20 MG PO TABS: 20.0000 mg | ORAL_TABLET | Freq: Every day | ORAL | 1 refills | Status: DC

## 2019-01-26 NOTE — Telephone Encounter (Signed)
[facility] pharmacy says the Andrew Sullivan has been denied.  Please call him back to discuss this as they cannot fill it although we approved it.  Please call patient at [phone] today, thanks.

## 2019-01-26 NOTE — Progress Notes (Signed)
Resent to different pharmacy for patient at [facility] on [address]

## 2019-01-26 NOTE — Telephone Encounter (Signed)
I also attempted to call several times.  I wonder if [facility] on [address] told him we denied it.  We did deny it on 01/25/19 because we sent it to [facility] on [address] on 01/24/19.  Need to clarify what pt is talking about.  Will await callback from patient. Christen Bame, CMA

## 2019-01-26 NOTE — Patient Instructions (Signed)
Patient declines AVS. 

## 2019-01-26 NOTE — Assessment & Plan Note (Signed)
Patient states he is getting paperwork faxed to [place] for evaluation to see if he will qualify for a power wheelchair.  - After discussion with Dr. McDiarmid he recommended a referral to Neuro rehad for wheel chair assessment.

## 2019-01-27 NOTE — Telephone Encounter (Signed)
Attempted to call again, same response (VM full).  PCP resent script to [facility] on [address].   Will await call from patient. Christen Bame, CMA

## 2019-02-08 NOTE — Telephone Encounter (Signed)
Spoke with Dr. Garlan Fillers.  We still have been unable to reach pt (attempted again today)  I did call [facility] on [address] and he picked up [place] on 01/28/19.  To MD to In[facility]. Christen Bame, CMA

## 2019-03-30 ENCOUNTER — Telehealth: Payer: Self-pay | Admitting: Family Medicine

## 2019-03-30 NOTE — Telephone Encounter (Signed)
Pt is calling and would like for Dr. Garlan Fillers to call him. I asked if he would like to make an appointment but he said he did not need an appointment. He said he needs to talk to him as soon as possible about a person matter. Dr. Garlan Fillers does not have any appointments including virtual until 02/02.  Please call pt to discuss above. The best call back number is [phone].

## 2019-03-31 NOTE — Telephone Encounter (Signed)
Patient calling again requesting Dr. Garlan Fillers give him a call. He says [employer] has been faxing papers for him to get a motorized scooter. He says they have been faxing these papers since November and yet to have received anything back. I checked [place]'s box and did not see anything, and also don't recall receiving anything for this patient since I mostly do the faxes.   Patient would still like for Dr. Garlan Fillers to give him a call at his earliest convenience.

## 2019-04-01 NOTE — Telephone Encounter (Signed)
Received faxed paperwork from [employer] regarding this power scooter. Placed in [place] on 04/01/2019.

## 2019-04-05 NOTE — Telephone Encounter (Signed)
Attempted to call patient and there was no answer.  Generic voicemail was left asking patient to call office.  Ozella Almond, [place]

## 2019-04-06 NOTE — Telephone Encounter (Signed)
Patient calls nurse line inquiring about the mobilized scooter forms. I informed patient his PCP has made several attempts to contact him and inform of need for neuro rehab evaluation. Patient stated, "my phone was probably off." Information given to patient to contact rehab center, their phone number.

## 2019-04-10 ENCOUNTER — Other Ambulatory Visit: Payer: Self-pay | Admitting: Family Medicine

## 2019-04-10 DIAGNOSIS — G8250 Quadriplegia, unspecified: Secondary | ICD-10-CM

## 2019-04-10 DIAGNOSIS — R532 Functional quadriplegia: Secondary | ICD-10-CM

## 2019-04-13 ENCOUNTER — Other Ambulatory Visit: Payer: Self-pay | Admitting: Family Medicine

## 2019-04-13 DIAGNOSIS — R532 Functional quadriplegia: Secondary | ICD-10-CM

## 2019-04-13 DIAGNOSIS — G8250 Quadriplegia, unspecified: Secondary | ICD-10-CM

## 2019-04-14 ENCOUNTER — Emergency Department (HOSPITAL_COMMUNITY): Payer: Medicare HMO

## 2019-04-14 ENCOUNTER — Other Ambulatory Visit: Payer: Self-pay

## 2019-04-14 ENCOUNTER — Emergency Department (HOSPITAL_COMMUNITY)
Admission: EM | Admit: 2019-04-14 | Discharge: 2019-04-14 | Disposition: A | Discharge status: Home / Self Care | Payer: Medicare HMO | Attending: Emergency Medicine | Admitting: Emergency Medicine

## 2019-04-14 ENCOUNTER — Encounter (HOSPITAL_COMMUNITY): Payer: Self-pay | Admitting: Emergency Medicine

## 2019-04-14 DIAGNOSIS — D649 Anemia, unspecified: Secondary | ICD-10-CM | POA: Diagnosis not present

## 2019-04-14 DIAGNOSIS — Z86718 Personal history of other venous thrombosis and embolism: Secondary | ICD-10-CM | POA: Insufficient documentation

## 2019-04-14 DIAGNOSIS — Z87891 Personal history of nicotine dependence: Secondary | ICD-10-CM | POA: Insufficient documentation

## 2019-04-14 DIAGNOSIS — R319 Hematuria, unspecified: Secondary | ICD-10-CM | POA: Diagnosis not present

## 2019-04-14 DIAGNOSIS — R091 Pleurisy: Secondary | ICD-10-CM | POA: Insufficient documentation

## 2019-04-14 DIAGNOSIS — N029 Recurrent and persistent hematuria with unspecified morphologic changes: Secondary | ICD-10-CM | POA: Diagnosis not present

## 2019-04-14 DIAGNOSIS — Z7901 Long term (current) use of anticoagulants: Secondary | ICD-10-CM | POA: Insufficient documentation

## 2019-04-14 DIAGNOSIS — R079 Chest pain, unspecified: Secondary | ICD-10-CM | POA: Diagnosis not present

## 2019-04-14 LAB — TROPONIN I (HIGH SENSITIVITY)
Troponin I (High Sensitivity): 6 ng/L (ref <18)
Troponin I (High Sensitivity): 7 ng/L (ref <18)

## 2019-04-14 LAB — RETICULOCYTES
Immature Retic Fract: 26.1 % — ABNORMAL HIGH (ref 2.3–15.9)
RBC.: 3.59 MIL/uL — ABNORMAL LOW (ref 4.22–5.81)
Retic Count, Absolute: 34.8 10*3/uL (ref 19.0–186.0)
Retic Ct Pct: 1 % (ref 0.4–3.1)

## 2019-04-14 LAB — BASIC METABOLIC PANEL
Anion gap: 9 (ref 5–15)
BUN: 18 mg/dL (ref 6–20)
CO2: 25 mmol/L (ref 22–32)
Calcium: 8.9 mg/dL (ref 8.9–10.3)
Chloride: 106 mmol/L (ref 98–111)
Creatinine, Ser: 1.01 mg/dL (ref 0.61–1.24)
GFR calc Af Amer: >60 mL/min (ref >60)
GFR calc non Af Amer: >60 mL/min (ref >60)
Glucose, Bld: 112 mg/dL — ABNORMAL HIGH (ref 70–99)
Potassium: 4.6 mmol/L (ref 3.5–5.1)
Sodium: 140 mmol/L (ref 135–145)

## 2019-04-14 LAB — CBC
HCT: 28 % — ABNORMAL LOW (ref 39.0–52.0)
Hemoglobin: 7.3 g/dL — ABNORMAL LOW (ref 13.0–17.0)
MCH: 18.3 pg — ABNORMAL LOW (ref 26.0–34.0)
MCHC: 26.1 g/dL — ABNORMAL LOW (ref 30.0–36.0)
MCV: 70.2 fL — ABNORMAL LOW (ref 80.0–100.0)
Platelets: 251 10*3/uL (ref 150–400)
RBC: 3.99 MIL/uL — ABNORMAL LOW (ref 4.22–5.81)
RDW: 18.2 % — ABNORMAL HIGH (ref 11.5–15.5)
WBC: 3.9 10*3/uL — ABNORMAL LOW (ref 4.0–10.5)
nRBC: 0 % (ref 0.0–0.2)

## 2019-04-14 LAB — URINALYSIS, ROUTINE W REFLEX MICROSCOPIC
Bilirubin Urine: NEGATIVE
Glucose, UA: NEGATIVE mg/dL
Ketones, ur: NEGATIVE mg/dL
Leukocytes,Ua: NEGATIVE
Nitrite: NEGATIVE
Protein, ur: 100 mg/dL — AB
RBC / HPF: >50 RBC/hpf — ABNORMAL HIGH (ref 0–5)
Specific Gravity, Urine: 1.02 (ref 1.005–1.030)
pH: 7 (ref 5.0–8.0)

## 2019-04-14 LAB — IRON AND TIBC
Iron: 15 ug/dL — ABNORMAL LOW (ref 45–182)
Saturation Ratios: 3 % — ABNORMAL LOW (ref 17.9–39.5)
TIBC: 451 ug/dL — ABNORMAL HIGH (ref 250–450)
UIBC: 436 ug/dL

## 2019-04-14 LAB — FOLATE: Folate: 9.2 ng/mL (ref >5.9)

## 2019-04-14 LAB — FERRITIN: Ferritin: 4 ng/mL — ABNORMAL LOW (ref 24–336)

## 2019-04-14 LAB — VITAMIN B12: Vitamin B-12: 188 pg/mL (ref 180–914)

## 2019-04-14 MED ORDER — IOHEXOL 350 MG/ML SOLN
80.0000 mL | Freq: Once | INTRAVENOUS | Status: AC | PRN
  Administered 2019-04-14: 80 mL via INTRAVENOUS

## 2019-04-14 MED ORDER — OMEPRAZOLE 20 MG PO CPDR: 20.0000 mg | DELAYED_RELEASE_CAPSULE | Freq: Every day | ORAL | 0 refills | Status: DC

## 2019-04-14 NOTE — ED Notes (Signed)
Urine Culture sent down with UA 

## 2019-04-14 NOTE — ED Triage Notes (Signed)
Pt arrives to ED from home with complaints of left sided aching chest pain since November. No acute change today, just wanted to get checked out.

## 2019-04-14 NOTE — ED Provider Notes (Signed)
[name] EMERGENCY DEPARTMENT Provider Note   CSN: [id number] Arrival date & time: 04/14/19  1247     History Chief Complaint  Patient presents with  . Chest Pain    Andrew Sullivan is a 49 y.o. male.  HPI Patient with history of paraplegia and DVT.  He is experiencing left-sided sharp chest pains episodically.  Not precipitated by exertion.  At baseline, patient is mobile by using his upper extremities to function a mechanical wheelchair.  Lower extremities are immobile.  Patient has had chronic swelling in the right lower extremity due to prior DVT.  He has been on Xarelto for 5 months.  No fever, no cough.  Patient denies exertional dyspnea.  Patient denies dark or black bowel movement.  He denies vomiting or hematemesis.  He reports he has had blood loss in his urine sporadically but that has happened for a very long time.    Past Medical History:  Diagnosis Date  . Erectile dysfunction   . Gross hematuria   . History of recurrent UTIs   . History of spinal cord injury    73 (age 89 fell from ladder) w/ cervical spine fx  s/p  c4 -- c6  fusion--  residual paraplegic bilateral legs (great below T8 uses w/c and can transfer self  . Paraparesis of both lower limbs ([place])    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 72 in 11  . Spastic tetraplegia [facility])     Patient Active Problem List   Diagnosis Date Noted  . DVT (deep venous thrombosis) ([place]) 10/19/2018  . Erectile dysfunction   . Spastic tetraplegia ([place]) 12/06/2013  . Normocytic anemia 11/05/2013  . History of recurrent UTIs 05/19/2010  . Functional quadriplegia ([place]) 05/10/2009    Past Surgical History:  Procedure Laterality Date  . CERVICAL FUSION  1988   C4 -- C6 w/ spinal cord injury  . CYSTOSCOPY WITH RETROGRADE PYELOGRAM, URETEROSCOPY AND STENT PLACEMENT Left 12/27/2013   Procedure: CYSTOSCOPY WITH RETROGRADE PYELOGRAM, URETEROSCOPY AND STENT PLACEMENT;  Surgeon: Reece Packer, MD;  Location: [facility];  Service: Urology;  Laterality: Left;  . CYSTOSCOPY/RETROGRADE/URETEROSCOPY Left 01/09/2014   Procedure: CYSTOSCOPY/RETROGRADE/URETEROSCOPY, URETERAL BIOPSY AND STENT EXCHANGE;  Surgeon: Alexis Frock, MD;  Location: [facility];  Service: Urology;  Laterality: Left;  . HIP ARTHROPLASTY  08/12/2011   Procedure: ARTHROPLASTY BIPOLAR HIP;  Surgeon: Mauri Pole, MD;  Location: [facility];  Service: Orthopedics;  Laterality: Left;  Hemi-arthroplasty       Family History  Problem Relation Age of Onset  . Arthritis Mother   . Hypertension Mother     Social History   Tobacco Use  . Smoking status: Former Smoker    Packs/day: 0.25    Years: 0.50    Pack years: 0.12    Types: Cigarettes    Quit date: 12/27/1986    Years since quitting: 32.3  . Smokeless tobacco: Never Used  Substance Use Topics  . Alcohol use: No  . Drug use: No    Home Medications Prior to Admission medications   Medication Sig Start Date End Date Taking? Authorizing Provider  baclofen (LIORESAL) 20 MG tablet Take 1 tablet by mouth 4 times daily 04/13/19   Nuala Alpha, DO  omeprazole (PRILOSEC) 20 MG capsule Take 1 capsule (20 mg total) by mouth daily. 04/14/19   Charlesetta Shanks, MD  rivaroxaban (XARELTO) 20 MG TABS tablet Take 1 tablet (20 mg total) by mouth daily with supper. With  a meal 01/26/19   Nuala Alpha, DO    Allergies    Penicillins  Review of Systems   Review of Systems 10 Systems reviewed and are negative for acute change except as noted in the HPI.  Physical Exam Updated Vital Signs BP 124/68   Pulse (!) 55   Temp 98.6 F (37 C) (Oral)   Resp 15   Wt 75.8 kg   SpO2 99%   BMI 21.44 kg/m   Physical Exam Constitutional:      Comments: Patient is alert and nontoxic.  Mental status is clear.  No respiratory distress at rest.  HENT:     Head: Normocephalic and atraumatic.  Eyes:     Extraocular Movements: Extraocular movements intact.   Cardiovascular:     Rate and Rhythm: Normal rate and regular rhythm.     Heart sounds: Normal heart sounds.  Pulmonary:     Effort: Pulmonary effort is normal.     Breath sounds: Normal breath sounds.     Comments: No chest wall pain to compression. Abdominal:     General: There is no distension.     Palpations: Abdomen is soft.     Tenderness: There is no abdominal tenderness. There is no guarding.  Genitourinary:    Comments: Patient refuses rectal exam. Musculoskeletal:     Comments: Patient has good use of bilateral upper extremities.  He can use him to pull himself forward in the stretcher.  Coordination.  Lower extremities are paralyzed.  He does have slightly asymmetric edema of the right lower extremity.  Skin condition is good of the lower extremities.  Skin:    General: Skin is warm and dry.  Neurological:     Comments: Patient is alert with normal cognitive function and speech.  Use of bilateral upper extremities.  Chronic paraplegia from distant spinal cord injury.  Psychiatric:        Mood and Affect: Mood normal.     ED Results / Procedures / Treatments   Labs (all labs ordered are listed, but only abnormal results are displayed) Labs Reviewed  CBC - Abnormal; Notable for the following components:      Result Value   WBC 3.9 (*)    RBC 3.99 (*)    Hemoglobin 7.3 (*)    HCT 28.0 (*)    MCV 70.2 (*)    MCH 18.3 (*)    MCHC 26.1 (*)    RDW 18.2 (*)    All other components within normal limits  BASIC METABOLIC PANEL - Abnormal; Notable for the following components:   Glucose, Bld 112 (*)    All other components within normal limits  URINALYSIS, ROUTINE W REFLEX MICROSCOPIC - Abnormal; Notable for the following components:   APPearance CLOUDY (*)    Hgb urine dipstick LARGE (*)    Protein, ur 100 (*)    RBC / HPF >50 (*)    Bacteria, UA RARE (*)    All other components within normal limits  IRON AND TIBC - Abnormal; Notable for the following components:    Iron 15 (*)    TIBC 451 (*)    Saturation Ratios 3 (*)    All other components within normal limits  FERRITIN - Abnormal; Notable for the following components:   Ferritin 4 (*)    All other components within normal limits  RETICULOCYTES - Abnormal; Notable for the following components:   RBC. 3.59 (*)    Immature Retic Fract 26.1 (*)  All other components within normal limits  VITAMIN B12  FOLATE  TROPONIN I (HIGH SENSITIVITY)  TROPONIN I (HIGH SENSITIVITY)    EKG EKG Interpretation  Date/Time:  Thursday April 14 2019 14:15:44 EST Ventricular Rate:  71 PR Interval:    QRS Duration: 88 QT Interval:  383 QTC Calculation: 417 R Axis:   85 Text Interpretation: Sinus rhythm Short PR interval Left ventricular hypertrophy ST elevation suggests acute pericarditis Tall T, consider metabolic/ischemic abnrm agree. similar to oldest tracing 2013 Confirmed by Charlesetta Shanks [phone]) on 04/14/2019 2:57:50 PM   Radiology DG Chest 2 View  Result Date: 04/14/2019 CLINICAL DATA:  Chest pain. EXAM: CHEST - 2 VIEW COMPARISON:  10/27/2018. FINDINGS: Mediastinum and hilar structures normal. Heart size normal. Lungs are clear. Mild basilar pleural thickening again noted consistent with scarring. IMPRESSION: 1.  Cardiomegaly.  No pulmonary venous congestion. 2. Mild basilar pleural thickening consistent with scarring again noted. Electronically Signed   By: Marcello Moores  Register   On: 04/14/2019 14:00   CT Angio Chest PE W/Cm &/Or Wo Cm  Result Date: 04/14/2019 CLINICAL DATA:  Chest pain. Evaluate for pulmonary embolus. EXAM: CT ANGIOGRAPHY CHEST WITH CONTRAST TECHNIQUE: Multidetector CT imaging of the chest was performed using the standard protocol during bolus administration of intravenous contrast. Multiplanar CT image reconstructions and MIPs were obtained to evaluate the vascular anatomy. CONTRAST:  56mL OMNIPAQUE IOHEXOL 350 MG/ML SOLN COMPARISON:  None. FINDINGS: Cardiovascular: Satisfactory  opacification of the pulmonary arteries to the segmental level. No evidence of pulmonary embolism. Normal heart size. No pericardial effusion. Normal heart size. There is no pericardial effusion. Mediastinum/Nodes: Normal appearance of the thyroid gland. The trachea appears patent and is midline. Normal appearance of the esophagus. No enlarged lymph nodes. Lungs/Pleura: No pleural effusion. No airspace consolidation, atelectasis or pneumothorax. Scarring is identified within the subpleural posterior right lower lung. Upper Abdomen: Unchanged small cyst within dome of liver measuring 1 cm. No acute abnormality identified. Musculoskeletal: No chest wall abnormality. No acute or significant osseous findings. Review of the MIP images confirms the above findings. IMPRESSION: 1. No acute pulmonary embolus. 2. Scarring identified within the subpleural aspect of the posterior right lower lung. . Electronically Signed   By: Kerby Moors M.D.   On: 04/14/2019 19:16    Procedures Procedures (including critical care time)  Medications Ordered in ED Medications  iohexol (OMNIPAQUE) 350 MG/ML injection 80 mL (80 mLs Intravenous Contrast Given 04/14/19 1857)    ED Course  I have reviewed the triage vital signs and the nursing notes.  Pertinent labs & imaging results that were available during my care of the patient were reviewed by me and considered in my medical decision making (see chart for details).    MDM Rules/Calculators/A&P                      Patient does have high risk for pulmonary embolus.  He is on Xarelto.  Residual asymmetric swelling of the lower extremities.  With recurrent pleuritic chest pain will proceed with CT PE study to rule out pulmonary embolus.  Patient's EKG shows early repolarization and T wave peaking.  It is abnormal in appearance but it is consistent with EKG from 2013.  Troponin is normal.  Patient does not describe any symptoms of ischemic type chest pain.  He is quite  physically active using his upper extremities to function a mechanical wheelchair.  Denies he gets any dyspnea or chest pain with this activity.  Will  recommend outpatient follow-up for evaluation of cardiac stress testing.  Patient does have anemia which is significant compared to September 2020.  Patient denies review of systems for GI bleeding.  Vital signs are normal.  Patient does not have tachycardia or hypotension.  He does not endorse any exertional dyspnea.  He appears not to be symptomatic with the anemia.  Patient adamantly refuses rectal exam.  We did discuss the possibility of peptic ulcer and chest pain as a possible etiology.  Also described demand ischemia.  Also advised him that with only small amount of continued drop in hemoglobin, and development of any symptoms, he might require blood transfusion.  He however does not agree to proceed with rectal exam at this time and wishes to pursue that on outpatient basis with his primary provider.  Reason cited is because he does not like that part of the exam.  At this time, from perspective anemia, patient is stable.  His vital signs are normal and review of systems does not suggest symptomatic anemia. No acute findings on CT scan.  Patient has had long-term, recurrent episodes of similar left anterior chest pain.  Stable for discharge.  Will initiate omeprazole and recommend follow-up regarding anemia and recurrent chest pain. Final Clinical Impression(s) / ED Diagnoses Final diagnoses:  Pleurisy  Anemia, unspecified type  Recurrent hematuria    Rx / DC Orders ED Discharge Orders         Ordered    omeprazole (PRILOSEC) 20 MG capsule  Daily     04/14/19 2057           Charlesetta Shanks, MD 04/18/19 [phone]

## 2019-04-14 NOTE — Discharge Instructions (Addendum)
1.  Is very important you follow-up with your family doctor for further evaluation for your low blood count.  You need to have a test done on your stool to make sure you are not losing blood from your stomach or intestines. 2.  Also follow-up with your urologist for recheck on the blood in your urine. 3.  Start omeprazole as prescribed.  Take this in the morning about 30 minutes before you eat.  Take it daily for 2 weeks to see if your symptoms improve.  This medication helps with stomach inflammation, reflux and ulcers. 4.  Return to the emergency department immediately if you feel lightheaded, short of breath, develop fever, you see dark or bloody looking bowel movement or other concerning symptoms.

## 2019-04-19 DIAGNOSIS — D649 Anemia, unspecified: Secondary | ICD-10-CM | POA: Diagnosis not present

## 2019-04-19 DIAGNOSIS — R310 Gross hematuria: Secondary | ICD-10-CM | POA: Diagnosis not present

## 2019-04-21 ENCOUNTER — Other Ambulatory Visit: Payer: Self-pay

## 2019-04-21 ENCOUNTER — Ambulatory Visit: Payer: Medicare HMO | Attending: Family Medicine | Admitting: Physical Therapy

## 2019-04-21 DIAGNOSIS — M62838 Other muscle spasm: Secondary | ICD-10-CM | POA: Diagnosis not present

## 2019-04-21 DIAGNOSIS — G8254 Quadriplegia, C5-C7 incomplete: Secondary | ICD-10-CM | POA: Insufficient documentation

## 2019-04-21 DIAGNOSIS — R2689 Other abnormalities of gait and mobility: Secondary | ICD-10-CM | POA: Diagnosis not present

## 2019-04-21 DIAGNOSIS — R296 Repeated falls: Secondary | ICD-10-CM | POA: Diagnosis not present

## 2019-04-21 DIAGNOSIS — R293 Abnormal posture: Secondary | ICD-10-CM | POA: Diagnosis not present

## 2019-04-21 NOTE — Therapy (Addendum)
[facility] [address] [place], [place], [address] Phone: [phone]   Fax:  [phone]  Physical Therapy Evaluation  Patient Details  Name: Andrew Sullivan MRN: [id number] Date of Birth: 01/15/1971 Referring Provider (PT): Nuala Alpha, DO; Blane Ohara McDiarmid, MD   Encounter Date: 04/21/2019  PT End of Session - 04/21/19 2042    Visit Number  1    Number of Visits  1    Date for PT Re-Evaluation  04/21/19   one visit for power w/c evaluation only   Authorization Type  [employer] Medicare/Medicaid    PT Start Time  [phone]    PT Stop Time  1100    PT Time Calculation (min)  82 min    Activity Tolerance  Patient tolerated treatment well    Behavior During Therapy  [facility] for tasks assessed/performed       Past Medical History:  Diagnosis Date  . Erectile dysfunction   . Gross hematuria   . History of recurrent UTIs   . History of spinal cord injury    70 (age 49 fell from ladder) w/ cervical spine fx  s/p  c4 -- c6  fusion--  residual paraplegic bilateral legs (great below T8 uses w/c and can transfer self  . Paraparesis of both lower limbs ([place])    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 67 in 77  . Spastic tetraplegia ([place])     Past Surgical History:  Procedure Laterality Date  . CERVICAL FUSION  1988   C4 -- C6 w/ spinal cord injury  . CYSTOSCOPY WITH RETROGRADE PYELOGRAM, URETEROSCOPY AND STENT PLACEMENT Left 12/27/2013   Procedure: CYSTOSCOPY WITH RETROGRADE PYELOGRAM, URETEROSCOPY AND STENT PLACEMENT;  Surgeon: Reece Packer, MD;  Location: [facility];  Service: Urology;  Laterality: Left;  . CYSTOSCOPY/RETROGRADE/URETEROSCOPY Left 01/09/2014   Procedure: CYSTOSCOPY/RETROGRADE/URETEROSCOPY, URETERAL BIOPSY AND STENT EXCHANGE;  Surgeon: Alexis Frock, MD;  Location: [facility];  Service: Urology;  Laterality: Left;  . HIP ARTHROPLASTY  08/12/2011   Procedure: ARTHROPLASTY BIPOLAR HIP;   Surgeon: Mauri Pole, MD;  Location: [facility];  Service: Orthopedics;  Laterality: Left;  Hemi-arthroplasty    There were no vitals filed for this visit.   Subjective Assessment - 04/21/19 2037    Subjective  Pt reports his manual w/c is broken and is in disrepair, having to borrow K4 manual wheelchair.  Presents for evaluation for power wheelchair.    Pertinent History  history of spinal cord injury in 1988 - fall from ladder causing C4-C6 resulting in spastic incomplete tetraplegia, cervical fusion, recurrent UTI, falls with L hip fracture requiring hip arthroplasty    Patient Stated Goals  to obtain power mobility    Currently in Pain?  Yes    Pain Location  Shoulder    Pain Orientation  Right;Left         [facility] PT Assessment - 04/21/19 2039      Assessment   Medical Diagnosis  Power wheelchair evaluation - Spastic Tetraplegia    Referring Provider (PT)  Nuala Alpha, DO; Blane Ohara McDiarmid, MD    Onset Date/Surgical Date  01/26/19    Prior Therapy  yes      Precautions   Precautions  Other (comment)    Precaution Comments  history of spinal cord injury in 1988 - fall from ladder causing C4-C6 resulting in spastic incomplete tetraplegia, cervical fusion, recurrent UTI, falls with L hip fracture requiring hip arthroplasty  Balance Screen   Has the patient fallen in the past 6 months  Yes      Prior Function   Level of Independence  Independent with household mobility with device;Independent with community mobility with device;Independent with transfers;Requires assistive device for independence        Mobility/Seating Evaluation    PATIENT INFORMATION: Name: Andrew Sullivan DOB: 03/14/1970  Sex: Male Date seen: 04/21/2019 Time: 09:38  Address:  [address] [place], [place] [address] (Mother's address - pt currently living with a friend at the address below)  [place], [place] [address]  Physician: Nuala Alpha, DO  (Resident) - co-signed by McDiarmid, Blane Ohara, MD This evaluation/justification form will serve as the LMN for the following suppliers: __________________________ Supplier: [employer] Contact Person: Mammie Lorenzo Phone:  [phone]   Seating Therapist: Misty Stanley, PT, DPT Phone:   [phone]   Phone: [phone] or [phone]     Spouse/Parent/Caregiver name: Judd Lien  Phone number: [phone]  (in e-chart phone number reads [phone]) Insurance/Payer: Josephine Igo and Medicaid      Reason for Referral: Power Mobility  Patient/Caregiver Goals: To obtain a power wheelchair to maintain independent mobility; patient is no longer able to functionally propel manual wheelchair; patient's current chair is in disrepair  Patient was seen for face-to-face evaluation for new power wheelchair.  Also present was Andrew Sullivan, ATP to discuss recommendations and wheelchair options.  Further paperwork was completed and sent to vendor.  Patient appears to qualify for power mobility device at this time per objective findings.   MEDICAL HISTORY: Diagnosis: Primary Diagnosis: Spastic Tetraplegia  Onset: 1988 Diagnosis: 2013 Left Femur Fracture following fall while ambulating   _0 Progressive Disease Relevant past and future surgeries: C4-C6 cervical fusion, ureteroscopy and stent placement, hip arthroplasty   Height: 6'3" Weight: 176 Explain recent changes or trends in weight: None   History including Falls: Falls when walking with RW due to spasms in LE causing falls; one fall caused L hip fracture.  History of recurrent UTI, anemia, DVT    HOME ENVIRONMENT: _1 House  _2 Condo/town home  _3 Apartment  _4 Assisted Living    _5 Lives Alone _6  Lives with Others                                                                                          Hours with caregiver: ?????  _7 Home is accessible to patient           Stairs      _8 Yes _9  No     Ramp _10 Yes _11 No Comments:  Apartment -  carpeted floors except kitchen and bathroom -tiled.  Small rooms but furniture spaced out - manual w/c does not fit in bathroom - too narrow    COMMUNITY ADL: TRANSPORTATION: _12 Car    _13 Van    <LOVFIEPPIRJJOACZ>_6<\/SAYTKZSWFUXNATFT>_73 Public Transportation    _15 Adapted w/c Lift    _16 Ambulance    _17 Other:       _18 Sits in wheelchair during transport  Employment/School: ????? Specific requirements pertaining to mobility ?????  Other: ?????    FUNCTIONAL/SENSORY PROCESSING SKILLS:  Handedness:   _19 Right     _20   Left    _0 NA  Comments:  ?????  Functional Processing Skills for Wheeled Mobility _1 Processing Skills are adequate for safe wheelchair operation  Areas of concern than may interfere with safe operation of wheelchair Description of problem   _2  Attention to environment      _3 Judgment      _4  Hearing  _5  Vision or visual processing      _6 Motor Planning  _7  Fluctuations in Behavior  ?????    VERBAL COMMUNICATION: _8 WFL receptive _9  WFL expressive _10 Understandable  _11 Difficult to understand  _12 non-communicative _13  Uses an augmented communication device  CURRENT SEATING / MOBILITY: Current Mobility Base:  _14 None _15 Dependent _16 Manual _17 Scooter _18 Power  Type of Control: ?????  Manufacturer:  Tivis Ringer 2 Light Size:  16 x 20 Age: 2013  Current Condition of Mobility Base:  Patient's current manual chair is broken and in disrepair x 4 months, no able to be assessed today   Current Wheelchair components:  Comfort Acta-Embrace Cushion, Positioning contoured back  Describe posture in present seating system:  Patient arrives in borrowed manual wheelchair - without proper seating and support patient dempnstrates significant kyphosis and posterior pelvic tilt, sacral sitting, femurs slid forward 8 inches beyond seat depth      SENSATION and SKIN ISSUES: Sensation _19 Intact  _20 Impaired _21 Absent  Level of sensation: ????? Pressure Relief: Able to perform effective pressure relief :    _22 Yes  _23  No Method: W/C Push up If not,  Why?: ?????  Skin Issues/Skin Integrity Current Skin Issues  _24 Yes _25 No _26 Intact _27  Red area_28  Open Area  _29 Scar Tissue _30 At risk from prolonged sitting Where  ?????  History of Skin Issues  _31 Yes _32 No Where  bilat heels When  1988  Hx of skin flap surgeries  _33 Yes _34 No Where  ????? When  ?????  Limited sitting tolerance _35 Yes _36 No Hours spent sitting in wheelchair daily: ?????  Complaint of Pain:  Please describe: Shoulder pain from rolling manual wheelchair - 5/10; pain in LE due to spasms   Swelling/Edema: LE edema due to DVT and dependent edema   ADL STATUS (in reference to wheelchair use):  Indep Assist Unable Indep with Equip Not assessed Comments  Dressing ????? ????? ????? X ????? seated on bed  Eating X ????? ????? ????? ????? ?????  Toileting ????? ????? ????? X ????? transfers to toilet with RW  Bathing ????? ????? ????? X ????? transfers to shower chair with RW  Grooming/Hygiene ????? ????? ????? ????? ????? seated  Meal Prep ????? ????? ????? X ????? ?????  IADLS ????? ????? ????? X ????? currently using borrowed manual wheelchair; reports significant pain in shoulders after propelling in community  Bowel Management: _37 Continent  _38 Incontinent  _39 Accidents Comments:  ?????  Bladder Management: _40 Continent  _41 Incontinent  _42 Accidents Comments:  ?????     WHEELCHAIR SKILLS: Manual w/c Propulsion: _43 UE or LE strength and endurance sufficient to participate in ADLs using manual wheelchair Arm : _44 left _45 right   _46 Both      Distance: ????? Foot:  _47 left _48 right   _49 Both  Operate Scooter: _50  Strength, hand grip, balance and transfer appropriate for use _51 Living environment is accessible for use of scooter  Operate Power w/c:  _52  Std. Joystick   _53  Alternative Controls Indep _54  Assist _55  Dependent/unable _56  N/A _57   _58 Safe          _59  Functional      Distance: >1,000  Bed confined without wheelchair _60  Yes _61  No   STRENGTH/RANGE OF MOTION:  Active Range  of Motion Strength  Shoulder Pt utilizes significant upper trap activation to flex at bilat shoulders - 110 deg with trunk in flexion due to impaired trunk strength and trunk control 3-/5 - compensates with trunk flexion and upper trap  Elbow WFL 5/5  Wrist/Hand WFL wrist extension, limited wrist flexion Metacarpal-Phalangeal joints in flexion, Interphalangeal joints remain in extension Wrist - 4/5 extension, 2/5 flexion Fingers - No active flexion of the PIP/DIP joints  Hip Limited hip extension: R lacks 60 deg, L lacks 35 deg 2-/5 bilateral; significant hypertonicity  Knee Limited knee extension: R lacks 55 deg, L lacks 45 deg 2-/5 flexion and extension, significant hypertonicity  Ankle Able to reach neutral DF passively, not actively 2-/5      MOBILITY/BALANCE:  _0  Patient is totally dependent for mobility  ?????    Balance Transfers Ambulation  Sitting Balance: Standing Balance: _1  Independent _2  Independent/Modified Independent  _3  WFL     _4  WFL _5  Supervision _6  Supervision  _7  Uses UE for balance  _8  Supervision _9  Min Assist _10  Ambulates with Assist  ?????    _11  Min Assist _12  Min assist _13  Mod Assist _14  Ambulates with Device:      _15  RW  _16  StW  _17  Cane  _18  ?????  _19  Mod Assist _20  Mod assist _21  Max assist   _22  Max Assist _23  Max assist _24  Dependent _25  Indep. Short Distance Only  _26  Unable _27  Unable _28  Lift / Sling Required Distance (in feet)  10 feet   _29  Sliding board _30  Unable to Ambulate (see explanation below)  Cardio Status:  _31 Intact  _32  Impaired   _33  NA     Recurrent Angina  Respiratory Status:  _34 Intact   _35 Impaired   _36 NA     ?????  Orthotics/Prosthetics: Was fit for bilat AFO after injury - no longer wearing  Comments (Address manual vs power w/c vs scooter): Andrew Sullivan has a mobility deficit which cannot be remediated safely with a cane or walker as he is not a safe or functional household ambulator due to spastic tetraplegia.  He has experienced multiple  falls when performing transfers or ambulating very short distances with a rolling walker due to the weakness and spasticity in his lower extremities.  One fall resulted in a left femur fracture requiring hip arthroplasty.  When performing a Timed Up and Go (TUG) test Andrew Sullivan required assistance from the therapist to perform safely due to crouched gait, right lower extremity scissoring and toe drag; he required 52 seconds to perform (times greater than 13.5 seconds indicate high falls risk).  Andrew Sullivan also requires full upper extremity support in standing to prevent a fall and is unable to release either hand from the rolling walker to perform MRADLs.  Until recently, Andrew Sullivan was able to utilize an Customer service manager wheelchair for independent mobility.  Andrew Sullivan now demonstrates multiple postural and medical changes that make him less functional and less independent in a manual wheelchair.  Andrew Sullivan presents with significant trunk weakness resulting in kyphosis and rounded shoulders.  Due to this posture Wilferd lacks sufficient upper extremity AROM and must compensate with increased upper trapezius activation and trunk flexion for stability/balance when he can't stabilize through his arms.  When reaching or propelling with this posture and compensatory muscle activation he is placing his shoulders at risk for repetitive injury.  Havish reports that after manually propelling he experiences a significant increase in shoulder pain.  He also lacks sufficient finger flexor strength to grip and functionally  propel a manual wheelchair.  Jadier also has begun to experience left sided chest pain and is being assessed for cardiac dysfunction which may limit is cardiopulmonary endurance to be able to propel a manual wheelchair community distances.  A scooter (POV) would not be appropriate for Andrew Sullivan due to his lack of shoulder flexion ROM and finger flexion strength to propel with a tiller-type  control.  Markes would be at an increased risk for falling when transferring on and off of the scooter and while seated on scooter due to his lower extremity weakness, spasticity, impaired trunk control and sitting balance - Eain requires bilateral upper extremity support to maintain sitting balance.  A scooter would not offer the specialty seating components Knight requires.  The seat depth and seat height would be too short for Kweku's height and would result in further sacral sitting and kyphosis.  Also, Tannen's living environment does not support the use of a scooter (POV) due to size of the rooms and the larger turning radius of the scooter.  Joden requires a power wheelchair to be able to perform his MRADL independently and with decreased risk for falls and another injury.  He also requires the specialty seating components of a power wheelchair to correct and maintain sitting posture to improve sitting balance and for more optimal shoulder position and use.  He requires the use of power recline and elevating leg rests to open up his hip angle and prevent further flexion contractures and for edema and tone management.  ?????         Anterior / Posterior Obliquity Rotation-Pelvis ?????  PELVIS    _0  _1  _2   Neutral Posterior Anterior  _3  _4  _5   WFL Rt elev Lt elev  _6  _7  _8   WFL Right Left                      Anterior    Anterior     _9  Fixed _10  Other _11  Partly Flexible _12  Flexible   _13  Fixed _14  Other _15  Partly Flexible  _16  Flexible  _17  Fixed _18  Other _19  Partly Flexible  _20  Flexible   TRUNK  _21  _22  _23   WFL ? Thoracic ? Lumbar  Kyphosis Lordosis  _24  _25  _26   WFL Convex Convex  Right Left _27 c-curve _28 s-curve _29 multiple  _30  Neutral _31  Left-anterior _32  Right-anterior     _33  Fixed _34  Flexible _35  Partly Flexible _36  Other  _37  Fixed _38  Flexible _39  Partly Flexible _40  Other  _41  Fixed             _42  Flexible _43  Partly Flexible _44  Other    Position Windswept   Limited hip extension bilaterally  HIPS          _45            _46               _47    Neutral       Abduct        ADduct         _48           _49            _50   Neutral Right           Left      _51  Fixed _52  Subluxed _53  Partly Flexible _54  Dislocated _55  Flexible  _56  Fixed _57  Other _58  Partly Flexible  _59  Flexible  Foot Positioning Knee Positioning  ?????    _0  WFL  _1 Lt _2 Rt _3  WFL  _4 Lt _5 Rt    KNEES ROM concerns: ROM concerns:    & Dorsi-Flexed _6 Lt _7 Rt Limited extension    FEET Plantar Flexed _8 Lt _9 Rt      Inversion                 _10 Lt _11 Rt      Eversion                 _12 Lt _13 Rt     HEAD _14  Functional _15  Good Head Control  ?????  & _16  Flexed         _17  Extended _18  Adequate Head Control    NECK _19  Rotated  Lt  _20  Lat Flexed Lt _21  Rotated  Rt _22  Lat Flexed Rt _23  Limited Head Control     _24  Cervical Hyperextension _25  Absent  Head Control     SHOULDERS ELBOWS WRIST& HAND Shoulders rest in depression, upward rotation, protracted.  Hands lack active finger flexion      Left     Right    Left     Right    Left     Right   U/E _26 Functional           _27 Functional WFL WFL _28 Fisting             _29 Fisting      _30 elev   _31 dep      _32 elev   _33 dep       _34 pro -_35 retract     _36 pro  _37 retract _38 subluxed             _39 subluxed           Goals for Wheelchair Mobility  _40  Independence with mobility in the home with motor related ADLs (MRADLs)  _41  Independence with MRADLs in the community _42  Provide dependent mobility  _43  Provide recline     _44 Provide tilt   Goals for Seating system _45  Optimize pressure distribution _46  Provide support needed to facilitate function or safety _47  Provide corrective forces to assist with maintaining or improving posture _48  Accommodate client's posture:   current seated postures and positions are not flexible or will not tolerate corrective forces _49  Client to be independent with relieving pressure in the wheelchair _50 Enhance  physiological function such as breathing, swallowing, digestion  Simulation ideas/Equipment trials:????? State why other equipment was unsuccessful:?????   MOBILITY BASE RECOMMENDATIONS and JUSTIFICATION: MOBILITY COMPONENT JUSTIFICATION  Manufacturer: [employer] Model: Corpus F3   Size: Width 17Seat Depth 22 _51 provide transport from point A to B      _52 promote Indep mobility  _53 is not a safe, functional ambulator _54 walker or cane inadequate _55 non-standard width/depth necessary to accommodate anatomical measurement _56  ?????  _57 Manual Mobility Base _58 non-functional ambulator    _59 Scooter/POV  _60 can safely operate  _61 can safely transfer   _62 has adequate trunk stability  _63 cannot functionally propel manual w/c  _64 Power Mobility Base  _65 non-ambulatory  _66 cannot functionally propel manual wheelchair  _67  cannot functionally and safely operate scooter/POV _68 can safely operate and willing to  _69 Stroller Base _70 infant/child  _71 unable to propel manual wheelchair _72 allows for growth _73 non-functional ambulator _74 non-functional UE _75 Indep mobility is not a goal at this time  _76 Tilt  _77 Forward _78 Backward _79 Powered tilt  _80 Manual tilt  _81 change position against gravitational force on head and shoulders  _82 change position for pressure relief/cannot weight shift _83 transfers  _84 management of tone _85 rest periods _86 control edema _87 facilitate postural control  _88  ?????  [  x]Recline  _0 Power recline on power base _1 Manual recline on manual base  _2 accommodate femur to back angle  _3 bring to full recline for ADL care  _4 change position for pressure relief/cannot weight shift _5 rest periods _6 repositioning for transfers or clothing/diaper /catheter changes _7 head positioning  _8 Lighter weight required _9 self- propulsion  _10 lifting _11  ?????  _12 Heavy Duty required _13 user weight greater than 250# _14 extreme tone/ over active movement _15 broken frame on previous chair _16  ?????  _17  Back   _18  Angle Adjustable _19  Custom molded Corpus Ergo Back _20 postural control _21 control of tone/spasticity _22 accommodation of range of motion _23 UE functional control _24 accommodation for seating system _25  ????? _26 provide lateral trunk support _27 accommodate deformity _28 provide posterior trunk support _29 provide lumbar/sacral support _30 support trunk in midline _31 Pressure relief over spinal processes  _32  Seat Cushion Corpus Ergo Cushion-Leatherette Cover _33 impaired sensation  _34 decubitus ulcers present _35 history of pressure ulceration _36 prevent pelvic extension _37 low maintenance  _38 stabilize pelvis  _39 accommodate obliquity _40 accommodate multiple deformity _41 neutralize lower extremity position _42 increase pressure distribution _43  ?????  _44  Pelvic/thigh support  _45  Lateral thigh guide _46  Distal medial pad  _47  Distal lateral pad _48  pelvis in neutral _49 accommodate pelvis _50  position upper legs _51  alignment _52  accommodate ROM _53  decr adduction _54 accommodate tone _55 removable for transfers _56 decr abduction  _57  Lateral trunk Supports _58  Lt     _59  Rt _60 decrease lateral trunk leaning _61 control tone _62 contour for increased contact _63 safety  _64 accommodate asymmetry _65  ?????  _66  Mounting hardware  _67 lateral trunk supports  _68 back   _69 seat _70 headrest      _71  thigh support _72 fixed   _73 swing away _74 attach seat platform/cushion to w/c frame _75 attach back cushion to w/c frame _76 mount postural supports _77 mount headrest  _78 swing medial thigh support away _79 swing lateral supports away for transfers  _80  ?????    Armrests  _81 fixed _82 adjustable height _83 removable   _84 swing away  _85 flip back   _86 reclining _87 full length pads _88 desk    _89 pads tubular  _90 provide support with elbow at 90   _91 provide support for w/c tray _92 change of height/angles for variable activities _93 remove for transfers _94 allow to come closer to table top _95 remove for access to tables _96  ?????   Hangers/ Leg rests  _97 60 _98 70 _99 90 _100 elevating _101 heavy duty  _102 articulating _103 fixed _104 lift off _105 swing away     _106 power _107 provide LE support  _108 accommodate to hamstring tightness _109 elevate legs during recline   _110 provide change in position for Legs _111 Maintain placement of feet on footplate _112 durability _113 enable transfers _114 decrease edema _115 Accommodate lower leg length _116  management of tone  Foot support Footplate    <[id number]>_6<\/[id number]>_643 Lt  _118  Rt  _119  Center mount _120 flip up     _121 depth/angle adjustable _122 Amputee adapter    _123  Lt     _124  Rt _125 provide foot support _126 accommodate to ankle ROM _127 transfers _128 Provide support for residual extremity _129  allow foot to go under wheelchair base _130  decrease tone  _131  ?????  _132  Ankle strap/heel loops _133 support foot on foot support _134 decrease extraneous movement _135 provide input to heel  _136 protect foot  Tires: _137 pneumatic  _138 flat free inserts  _139 solid  _140 decrease maintenance  _141 prevent frequent flats _142 increase shock absorbency _143 decrease pain from road shock _144 decrease spasms from road shock _145  ?????  _146  Headrest  _147 provide posterior head support _148 provide posterior neck support _149 provide lateral head support _150 provide anterior head support _151 support during tilt and recline _152 improve feeding   _153 improve respiration _154 placement of switches _155 safety  _156 accommodate ROM  _157 accommodate tone _158 improve visual orientation  _159  Anterior chest strap _160  Vest _161  Shoulder retractors  _162   decrease forward movement of shoulder _0 accommodation of TLSO _1 decrease forward movement of trunk _2 decrease shoulder elevation _3 added abdominal support _4 alignment _5 assistance with shoulder control  _6  ?????  Pelvic Positioner _7 Belt _8 SubASIS bar _9 Dual Pull _10 stabilize tone _11 decrease falling out of chair/ **will not Decr potential for sliding due to pelvic tilting _12 prevent excessive rotation _13 pad for protection over boney  prominence _14 prominence comfort _15 special pull angle to control rotation _16  ?????  Upper Extremity Support _17 L   _18  R _19 Arm trough    _20 hand support _21  tray       _22 full tray _23 swivel mount _24 decrease edema      _25 decrease subluxation   _26 control tone   _27 placement for AAC/Computer/EADL _28 decrease gravitational pull on shoulders _29 provide midline positioning _30 provide support to increase UE function _31 provide hand support in natural position _32 provide work surface   POWER WHEELCHAIR CONTROLS  _33 Proportional  _34 Non-Proportional Type JoyStick _35 Left  _36 Right _37 provides access for controlling wheelchair   _38 lacks motor control to operate proportional drive control <DGUYQIHKVQQVZDGL>_8<\/VFIEPPIRJJOACZYS>_06 unable to understand proportional controls  Actuator Control Module  _40 Single  _41 Multiple   _42 Allow the client to operate the power seat function(s) through the joystick control   _43 Safety Reset Switches _44 Used to change modes and stop the wheelchair when driving in latch mode    _45 [employer]   _46 programming for accurate control _47 progressive Disease/changing condition _48 non-proportional drive control needed _49 Needed in order to operate power seat functions through joystick control   _50 Display box _51 Allows user to see in which mode and drive the wheelchair is set  _52 necessary for alternate controls    _53 Digital interface electronics _54 Allows w/c to operate when using alternative drive controls  <TKZSWFUXNATFTDDU>_2<\/GURKYHCWCBJSEGBT>_51 ASL Head Array _56 Allows client to operate wheelchair  through switches placed in tri-panel headrest  _57 Sip and puff with tubing kit _58 needed to operate sip and puff drive controls  <VOHYWVPXTGGYIRSW>_5<\/IOEVOJJKKXFGHWEX>_93 Upgraded tracking electronics _60 increase safety when driving <ZJIRCVELFYBOFBPZ>_0<\/CHENIDPOEUMPNTIR>_44 correct tracking when on uneven surfaces  _62 [facility] for switches or joystick _63 Attaches switches to w/c  _64 Swing away for access or transfers _65 midline for optimal placement _66 provides for consistent access  _67 Attendant controlled joystick plus mount  _68 safety _69 long distance driving <RXVQMGQQPYPPJKDT>_2<\/IZTIWPYKDXIPJASN>_05 operation of seat functions _71 compliance with transportation regulations _72  ?????    Rear wheel placement/Axle adjustability _73 None _74 semi adjustable _75 fully adjustable  _76 improved UE access to wheels _77 improved stability _78 changing angle in space for improvement of postural stability _79 1-arm drive access <LZJQBHALPFXTKWIO>_9<\/BDZHGDJMEQASTMHD>_62 amputee pad placement _81  ?????  Wheel rims/ hand rims  _82 metal  _83 plastic coated _84 oblique projections _85 vertical projections _86 Provide ability to propel manual wheelchair  _87  Increase self-propulsion with hand weakness/decreased grasp  Push handles _88 extended  _89 angle adjustable  _90 standard _91 caregiver access _92 caregiver assist _93 allows "hooking" to enable increased ability to perform ADLs or maintain balance  One armed device  _94 Lt   _95 Rt _96 enable propulsion of manual wheelchair with one arm   _97  ?????   Brake/wheel lock extension _98  Lt   _99  Rt _100 increase indep in applying wheel locks   _101 Side guards _102 prevent clothing getting caught in wheel or becoming soiled _103  prevent skin tears/abrasions  Battery: Group 24 x 2 _104 to power wheelchair ?????  Other: ????? ????? ?????  The above equipment has a life- long use expectancy. Growth and changes in medical and/or functional conditions would be the exceptions. This is to certify that the therapist has no financial relationship with durable medical provider or manufacturer. The therapist will not receive remuneration of any kind for the equipment recommended in this evaluation.   Patient has mobility limitation that significantly impairs safe, timely participation  in one or more mobility related ADL's.  (bathing, toileting, feeding, dressing, grooming, moving from room to room)                                                             _0  Yes _1  No Will mobility device sufficiently improve ability to participate and/or be aided in participation of MRADL's?         _2  Yes _3  No Can limitation be  compensated for with use of a cane or walker?                                                                                _4  Yes _5  No Does patient or caregiver demonstrate ability/potential ability & willingness to safely use the mobility device?   _6  Yes _7  No Does patient's home environment support use of recommended mobility device?                                                    _8  Yes _9  No Does patient have sufficient upper extremity function necessary to functionally propel a manual wheelchair?    _10  Yes _11  No Does patient have sufficient strength and trunk stability to safely operate a POV (scooter)?                                  _12  Yes _13  No Does patient need additional features/benefits provided by a power wheelchair for MRADL's in the home?       _14  Yes _15  No Does the patient demonstrate the ability to safely use a power wheelchair?                                                              _16  Yes _17  No  Therapist Name Printed: Tilda Burrow. Melrose Nakayama, PT, DPT Date: 04/21/2019  Therapist's Signature:   Date:   Supplier's Name Printed: [employer] Date: 04/21/2019  Supplier's Signature:   Date:  Patient/Caregiver Signature:   Date:     This is to certify that I have read this evaluation and do agree with the content within:      Physician's Name Printed: ?????  Physician's Signature:  Date:     This is to certify that I, the above signed therapist have the following affiliations: _18  This DME provider _19  Manufacturer of recommended equipment _20  Patient's long term care facility _21  None of the above     Objective measurements completed on examination: See above findings.       PT Education - 04/21/19 2042  Education Details  difference between power scooter vs. power w/c; process for obtaining a power w/c    Person(s) Educated  Patient    Methods  Explanation    Comprehension  Verbalized understanding        Plan - 04/21/19 2043    Clinical  Impression Statement  Pt is a 49 year old male referred to Neuro OPPT for evaluation of for power mobility.  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 The following deficits were noted during pt's exam: Impaired UE and LE ROM and strength, impaired posture, impaired sitting and standing balance, hypertonicity/spasticity in bilat LE, impaired gait and pain in shoulders.  Pt's TUG score indicates pt is at high risk for falls. Pt would benefit from power mobility and power seat functions in order to maximize functional mobility independence, improve posture/postural control, shoulder joint conservation and reduce falls risk.    Personal Factors and Comorbidities  Comorbidity 2;Social Background;Transportation;Time since onset of injury/illness/exacerbation    Comorbidities  history of spinal cord injury in 1988 - fall from ladder causing C4-C6 resulting in spastic incomplete tetraplegia, cervical fusion, recurrent UTI, falls with L hip fracture requiring hip arthroplasty    Examination-Activity Limitations  Locomotion Level;Stand;Transfers;Dressing;Toileting;Bathing    Examination-Participation Restrictions  Community Activity;Meal Prep;Shop    Stability/Clinical Decision Making  Evolving/Moderate complexity    Clinical Decision Making  Moderate    PT Frequency  One time visit    PT Duration  Other (comment)   one visit for wheelchair evaluation only   Consulted and Agree with Plan of Care  Patient       Patient will benefit from skilled therapeutic intervention in order to improve the following deficits and impairments:  Abnormal gait, Decreased balance, Decreased range of motion, Decreased strength, Difficulty walking, Increased muscle spasms, Impaired tone, Impaired UE functional use, Postural  dysfunction, Pain  Visit Diagnosis: Quadriplegia, C5-C7 incomplete (HCC)  Other muscle spasm  Abnormal posture  Other abnormalities of gait and mobility  Repeated falls     Problem List Patient Active Problem List   Diagnosis Date Noted  . DVT (deep venous thrombosis) ([place]) 10/19/2018  . Erectile dysfunction   . Spastic tetraplegia ([place]) 12/06/2013  . Normocytic anemia 11/05/2013  . History of recurrent UTIs 05/19/2010  . Functional quadriplegia ([place]) 05/10/2009    Rico Junker, PT, DPT 04/21/19    8:55 PM    [facility] [address] [place], [place], [address] Phone: [phone]   Fax:  [phone]  Name: EVIAN SALGUERO MRN: [id number] Date of Birth: 1970/05/17

## 2019-05-04 DIAGNOSIS — R3129 Other microscopic hematuria: Secondary | ICD-10-CM | POA: Diagnosis not present

## 2019-05-04 DIAGNOSIS — R310 Gross hematuria: Secondary | ICD-10-CM | POA: Diagnosis not present

## 2019-05-12 ENCOUNTER — Encounter (HOSPITAL_COMMUNITY): Payer: Self-pay | Admitting: Pharmacy Technician

## 2019-05-12 ENCOUNTER — Observation Stay (HOSPITAL_COMMUNITY)
Admission: EM | Admit: 2019-05-12 | Discharge: 2019-05-14 | Disposition: A | Discharge status: Home / Self Care | Payer: Medicare HMO | Attending: Family Medicine | Admitting: Family Medicine

## 2019-05-12 ENCOUNTER — Other Ambulatory Visit: Payer: Self-pay

## 2019-05-12 DIAGNOSIS — K219 Gastro-esophageal reflux disease without esophagitis: Secondary | ICD-10-CM | POA: Diagnosis not present

## 2019-05-12 DIAGNOSIS — D649 Anemia, unspecified: Secondary | ICD-10-CM | POA: Diagnosis not present

## 2019-05-12 DIAGNOSIS — Z993 Dependence on wheelchair: Secondary | ICD-10-CM | POA: Insufficient documentation

## 2019-05-12 DIAGNOSIS — M62838 Other muscle spasm: Secondary | ICD-10-CM | POA: Insufficient documentation

## 2019-05-12 DIAGNOSIS — Z20822 Contact with and (suspected) exposure to covid-19: Secondary | ICD-10-CM | POA: Insufficient documentation

## 2019-05-12 DIAGNOSIS — R310 Gross hematuria: Secondary | ICD-10-CM | POA: Insufficient documentation

## 2019-05-12 DIAGNOSIS — Z87891 Personal history of nicotine dependence: Secondary | ICD-10-CM | POA: Diagnosis not present

## 2019-05-12 DIAGNOSIS — Z7901 Long term (current) use of anticoagulants: Secondary | ICD-10-CM | POA: Diagnosis not present

## 2019-05-12 DIAGNOSIS — G8220 Paraplegia, unspecified: Secondary | ICD-10-CM | POA: Insufficient documentation

## 2019-05-12 DIAGNOSIS — Z86718 Personal history of other venous thrombosis and embolism: Secondary | ICD-10-CM | POA: Diagnosis not present

## 2019-05-12 DIAGNOSIS — R519 Headache, unspecified: Secondary | ICD-10-CM | POA: Diagnosis not present

## 2019-05-12 DIAGNOSIS — R600 Localized edema: Secondary | ICD-10-CM | POA: Diagnosis not present

## 2019-05-12 DIAGNOSIS — Z79899 Other long term (current) drug therapy: Secondary | ICD-10-CM | POA: Insufficient documentation

## 2019-05-12 MED ORDER — SODIUM CHLORIDE 0.9 % IV BOLUS (SEPSIS)
1000.0000 mL | Freq: Once | INTRAVENOUS | Status: AC
  Administered 2019-05-12: 1000 mL via INTRAVENOUS

## 2019-05-12 MED ORDER — KETOROLAC TROMETHAMINE 30 MG/ML IJ SOLN
30.0000 mg | Freq: Once | INTRAMUSCULAR | Status: AC
  Administered 2019-05-13: 30 mg via INTRAVENOUS
  Filled 2019-05-12: qty 1

## 2019-05-12 NOTE — ED Triage Notes (Signed)
Pt arrives pov with reports of headache onset today. Also reports a fluctuation in his temperature. 97.40F-98.70F at home. Pt in NAD.

## 2019-05-12 NOTE — ED Provider Notes (Signed)
TIME SEEN: 11:14 PM  CHIEF COMPLAINT: Headache, generalized weakness, hematuria  HPI: Patient is a 49 year old male with history of recurrent urinary tract infections, spinal cord injury of 1988 with a C4-C6 fusion with residual paraplegia who presents to the emergency department today with frontal throbbing headache since February 24.  States he has had similar headaches in the past that normally dissipate on their own.  He has not taken any medications at home for his pain because he states he does not like to take medicine.  No recent head injury.  States he finished taking blood thinners 3 weeks ago for a DVT.  No new numbness, tingling or focal weakness.  States he feels weak all over.  He states his temperature has been fluctuating but never higher than 98.7.  No photophobia, phonophobia.  States headache improves with eating food.  No nausea, vomiting or diarrhea.  States symptoms started after he was exposed to a Covid area in the [facility] on the 24th after seeing his urologist for his gross hematuria.  He is concerned he could have Covid.  No sore throat, cough, shortness of breath, loss of taste or smell.  Has follow-up with his urologist on March 9.  ROS: See HPI Constitutional: no fever  Eyes: no drainage  ENT: no runny nose   Cardiovascular:  no chest pain  Resp: no SOB  GI: no vomiting GU: no dysuria Integumentary: no rash  Allergy: no hives  Musculoskeletal: no leg swelling  Neurological: no slurred speech ROS otherwise negative  PAST MEDICAL HISTORY/PAST SURGICAL HISTORY:  Past Medical History:  Diagnosis Date  . Erectile dysfunction   . Gross hematuria   . History of recurrent UTIs   . History of spinal cord injury    6 (age 36 fell from ladder) w/ cervical spine fx  s/p  c4 -- c6  fusion--  residual paraplegic bilateral legs (great below T8 uses w/c and can transfer self  . Paraparesis of both lower limbs ([place])    secondary traumatic spinal cord injury  age 64 in 69  . Spastic tetraplegia (HCC)     MEDICATIONS:  Prior to Admission medications   Medication Sig Start Date End Date Taking? Authorizing Provider  baclofen (LIORESAL) 20 MG tablet Take 1 tablet by mouth 4 times daily 04/13/19   Nuala Alpha, DO  omeprazole (PRILOSEC) 20 MG capsule Take 1 capsule (20 mg total) by mouth daily. 04/14/19   Charlesetta Shanks, MD  rivaroxaban (XARELTO) 20 MG TABS tablet Take 1 tablet (20 mg total) by mouth daily with supper. With a meal 01/26/19   Nuala Alpha, DO    ALLERGIES:  Allergies  Allergen Reactions  . Penicillins Itching and Rash    Did it involve swelling of the face/tongue/throat, SOB, or low BP? No Did it involve sudden or severe rash/hives, skin peeling, or any reaction on the inside of your mouth or nose? Yes Did you need to seek medical attention at a hospital or doctor's office? Yes When did it last happen?Childhood If all above answers are "NO", may proceed with cephalosporin use.    SOCIAL HISTORY:  Social History   Tobacco Use  . Smoking status: Former Smoker    Packs/day: 0.25    Years: 0.50    Pack years: 0.12    Types: Cigarettes    Quit date: 12/27/1986    Years since quitting: 32.3  . Smokeless tobacco: Never Used  Substance Use Topics  . Alcohol use: No  FAMILY HISTORY: Family History  Problem Relation Age of Onset  . Arthritis Mother   . Hypertension Mother     EXAM: BP 109/62 (BP Location: Right Arm)   Pulse 88   Temp 98.7 F (37.1 C) (Oral)   Resp 18   SpO2 100%  CONSTITUTIONAL: Alert and oriented and responds appropriately to questions. Well-appearing; well-nourished HEAD: Normocephalic, atraumatic EYES: Conjunctivae clear, pupils appear equal, EOM appear intact; no photophobia ENT: normal nose; moist mucous membranes NECK: Supple, normal ROM, no meningismus or nuchal rigidity CARD: RRR; S1 and S2 appreciated; no murmurs, no clicks, no rubs, no gallops RESP: Normal chest excursion  without splinting or tachypnea; breath sounds clear and equal bilaterally; no wheezes, no rhonchi, no rales, no hypoxia or respiratory distress, speaking full sentences ABD/GI: Normal bowel sounds; non-distended; soft, non-tender, no rebound, no guarding, no peritoneal signs, no hepatosplenomegaly BACK:  The back appears normal EXT: Normal ROM in all joints; no deformity noted, no edema; no cyanosis SKIN: Normal color for age and race; warm; no rash on exposed skin NEURO: Paraplegic since 49 years old.  Normal movement of bilateral upper extremities.  Reports normal sensation diffusely.  Cranial nerves II through XII intact.  Normal speech. PSYCH: The patient's mood and manner are appropriate.   MEDICAL DECISION MAKING: Patient here with complaints of frontal throbbing headache, generalized weakness, gross hematuria.  Has had similar headaches before.  Not sudden onset or worst headache of his life.  No history of migraines.  No fever, neck pain or neck stiffness.  Is no focal neurologic deficits that are new.  Doubt intracranial hemorrhage, stroke, meningitis, encephalitis.  Will give Toradol and reassess.  He is also complaining of generalized weakness and being concerned that he could have been exposed to Covid recently.  Will provide with Covid test he can follow-up with results on through MyChart 24 hours.  Denies any shortness of breath and is not hypoxic.  Was not appear toxic or septic.  Will check basic labs to ensure no electrolyte derangement contributing to his feelings of fatigue.  Also reports ongoing gross hematuria.  Has history of chronic UTIs.  Will check urinalysis, urine culture today to rule out infection as cause of any of his symptoms.  He has follow-up with urology on March 9.   ED PROGRESS: Patient's hemoglobin is 6.0, down from 7.3 a month ago.  No bloody stool or melena.  Suspect this is from his gross hematuria that he reports has been on and off since he was in his 9s.   He is being followed by urology as an outpatient.  Urinalysis and culture pending.  Otherwise labs unremarkable.  Covid pending.  He agrees to receiving blood transfusion here.  Have ordered 2 units of packed red blood cells.  Will discuss with family medicine for admission.  1:08 AM Discussed patient's case with FM resident.  I have recommended admission and patient (and family if present) agree with this plan. Admitting physician will place admission orders.   I reviewed all nursing notes, vitals, pertinent previous records and interpreted all EKGs, lab and urine results, imaging (as available).     CRITICAL CARE Performed by: Pryor Curia   Total critical care time: 45 minutes  Critical care time was exclusive of separately billable procedures and treating other patients.  Critical care was necessary to treat or prevent imminent or life-threatening deterioration.  Critical care was time spent personally by me on the following activities: development of treatment plan with  patient and/or surrogate as well as nursing, discussions with consultants, evaluation of patient's response to treatment, examination of patient, obtaining history from patient or surrogate, ordering and performing treatments and interventions, ordering and review of laboratory studies, ordering and review of radiographic studies, pulse oximetry and re-evaluation of patient's condition.   Andrew Sullivan was evaluated in Emergency Department on 05/12/2019 for the symptoms described in the history of present illness. He was evaluated in the context of the global COVID-19 pandemic, which necessitated consideration that the patient might be at risk for infection with the SARS-CoV-2 virus that causes COVID-19. Institutional protocols and algorithms that pertain to the evaluation of patients at risk for COVID-19 are in a state of rapid change based on information released by regulatory bodies including the CDC and federal and  state organizations. These policies and algorithms were followed during the patient's care in the ED.  Patient was seen wearing N95, face shield, gloves.    Kylin Genna, Delice Bison, DO 05/13/19 [phone]

## 2019-05-13 ENCOUNTER — Encounter (HOSPITAL_COMMUNITY): Payer: Self-pay | Admitting: Family Medicine

## 2019-05-13 DIAGNOSIS — D649 Anemia, unspecified: Secondary | ICD-10-CM | POA: Diagnosis not present

## 2019-05-13 DIAGNOSIS — R310 Gross hematuria: Secondary | ICD-10-CM | POA: Diagnosis not present

## 2019-05-13 LAB — URINE CULTURE: Culture: <10000 — AB

## 2019-05-13 LAB — CBC WITH DIFFERENTIAL/PLATELET
Abs Immature Granulocytes: 0.02 10*3/uL (ref 0.00–0.07)
Basophils Absolute: 0 10*3/uL (ref 0.0–0.1)
Basophils Relative: 0 %
Eosinophils Absolute: 0.2 10*3/uL (ref 0.0–0.5)
Eosinophils Relative: 4 %
HCT: 23.5 % — ABNORMAL LOW (ref 39.0–52.0)
Hemoglobin: 6 g/dL — CL (ref 13.0–17.0)
Immature Granulocytes: 0 %
Lymphocytes Relative: 22 %
Lymphs Abs: 1.1 10*3/uL (ref 0.7–4.0)
MCH: 18 pg — ABNORMAL LOW (ref 26.0–34.0)
MCHC: 25.5 g/dL — ABNORMAL LOW (ref 30.0–36.0)
MCV: 70.4 fL — ABNORMAL LOW (ref 80.0–100.0)
Monocytes Absolute: 0.7 10*3/uL (ref 0.1–1.0)
Monocytes Relative: 14 %
Neutro Abs: 3 10*3/uL (ref 1.7–7.7)
Neutrophils Relative %: 60 %
Platelets: 328 10*3/uL (ref 150–400)
RBC: 3.34 MIL/uL — ABNORMAL LOW (ref 4.22–5.81)
RDW: 19.4 % — ABNORMAL HIGH (ref 11.5–15.5)
WBC: 5.1 10*3/uL (ref 4.0–10.5)
nRBC: 0.4 % — ABNORMAL HIGH (ref 0.0–0.2)

## 2019-05-13 LAB — CBC
HCT: 24 % — ABNORMAL LOW (ref 39.0–52.0)
Hemoglobin: 6.4 g/dL — CL (ref 13.0–17.0)
MCH: 19.3 pg — ABNORMAL LOW (ref 26.0–34.0)
MCHC: 26.7 g/dL — ABNORMAL LOW (ref 30.0–36.0)
MCV: 72.5 fL — ABNORMAL LOW (ref 80.0–100.0)
Platelets: 267 10*3/uL (ref 150–400)
RBC: 3.31 MIL/uL — ABNORMAL LOW (ref 4.22–5.81)
RDW: 21.2 % — ABNORMAL HIGH (ref 11.5–15.5)
WBC: 4.2 10*3/uL (ref 4.0–10.5)
nRBC: 0.5 % — ABNORMAL HIGH (ref 0.0–0.2)

## 2019-05-13 LAB — URINALYSIS, ROUTINE W REFLEX MICROSCOPIC
Bacteria, UA: NONE SEEN
Bilirubin Urine: NEGATIVE
Glucose, UA: NEGATIVE mg/dL
Ketones, ur: NEGATIVE mg/dL
Leukocytes,Ua: NEGATIVE
Nitrite: NEGATIVE
Protein, ur: 100 mg/dL — AB
RBC / HPF: >50 RBC/hpf — ABNORMAL HIGH (ref 0–5)
Specific Gravity, Urine: 1.026 (ref 1.005–1.030)
pH: 5 (ref 5.0–8.0)

## 2019-05-13 LAB — COMPREHENSIVE METABOLIC PANEL
ALT: 19 U/L (ref 0–44)
AST: 31 U/L (ref 15–41)
Albumin: 3.5 g/dL (ref 3.5–5.0)
Alkaline Phosphatase: 46 U/L (ref 38–126)
Anion gap: 8 (ref 5–15)
BUN: 21 mg/dL — ABNORMAL HIGH (ref 6–20)
CO2: 23 mmol/L (ref 22–32)
Calcium: 8.6 mg/dL — ABNORMAL LOW (ref 8.9–10.3)
Chloride: 110 mmol/L (ref 98–111)
Creatinine, Ser: 0.91 mg/dL (ref 0.61–1.24)
GFR calc Af Amer: >60 mL/min (ref >60)
GFR calc non Af Amer: >60 mL/min (ref >60)
Glucose, Bld: 104 mg/dL — ABNORMAL HIGH (ref 70–99)
Potassium: 4.2 mmol/L (ref 3.5–5.1)
Sodium: 141 mmol/L (ref 135–145)
Total Bilirubin: 0.4 mg/dL (ref 0.3–1.2)
Total Protein: 6.2 g/dL — ABNORMAL LOW (ref 6.5–8.1)

## 2019-05-13 LAB — PREPARE RBC (CROSSMATCH)

## 2019-05-13 LAB — HEMOGLOBIN AND HEMATOCRIT, BLOOD
HCT: 29.1 % — ABNORMAL LOW (ref 39.0–52.0)
Hemoglobin: 8.2 g/dL — ABNORMAL LOW (ref 13.0–17.0)

## 2019-05-13 LAB — SARS CORONAVIRUS 2 (TAT 6-24 HRS): SARS Coronavirus 2: NEGATIVE

## 2019-05-13 LAB — PROTIME-INR
INR: 1.1 (ref 0.8–1.2)
Prothrombin Time: 14.4 seconds (ref 11.4–15.2)

## 2019-05-13 MED ORDER — ACETAMINOPHEN 650 MG RE SUPP: 650.0000 mg | Freq: Four times a day (QID) | RECTAL | Status: DC | PRN

## 2019-05-13 MED ORDER — ACETAMINOPHEN 325 MG PO TABS: 650.0000 mg | ORAL_TABLET | Freq: Four times a day (QID) | ORAL | Status: DC | PRN

## 2019-05-13 MED ORDER — SODIUM CHLORIDE 0.9 % IV SOLN
10.0000 mL/h | Freq: Once | INTRAVENOUS | Status: AC
  Administered 2019-05-13: 10 mL/h via INTRAVENOUS

## 2019-05-13 MED ORDER — SODIUM CHLORIDE 0.9% IV SOLUTION: Freq: Once | INTRAVENOUS | Status: AC

## 2019-05-13 MED ORDER — PANTOPRAZOLE SODIUM 20 MG PO TBEC
20.0000 mg | DELAYED_RELEASE_TABLET | Freq: Every day | ORAL | Status: DC
  Administered 2019-05-13 – 2019-05-14 (×2): 20 mg via ORAL
  Filled 2019-05-13 (×2): qty 1

## 2019-05-13 MED ORDER — BACLOFEN 10 MG PO TABS
20.0000 mg | ORAL_TABLET | Freq: Four times a day (QID) | ORAL | Status: DC
  Administered 2019-05-13 – 2019-05-14 (×5): 20 mg via ORAL
  Filled 2019-05-13 (×6): qty 2

## 2019-05-13 NOTE — ED Notes (Signed)
MS   Breakfast ordered  

## 2019-05-13 NOTE — Progress Notes (Signed)
Paged ekg tech

## 2019-05-13 NOTE — Progress Notes (Signed)
Went to evaluate patient.  Patient was laying in bed comfortable.  No concerns.  Denies any new sources of bleeding but does report he is still having cranberry colored urine.  He has received 2 of his 3 units of blood.  Informed him that I had spoken with urology and that they would be coming to see him tonight.  Patient reports that his headache has improved but he still occasionally feels a minor headache but it resolves spontaneously.  Patient denies any shortness of breath, chest pain, nausea, vomiting.  Plan: -Patient will receive third unit of PRBCs -We will follow up on posttransfusion H&H -Urology consulted will appreciate recommendations when given -Urology will evaluate patient tonight and determine if cystoscopy needs to be done during hospitalization or if he can be followed up outpatient

## 2019-05-13 NOTE — Plan of Care (Signed)

## 2019-05-13 NOTE — Progress Notes (Signed)
Pt arrived to unit. No active tele orders. Paged Intern to verify if pt needs tele. Pt currently receiving prbcs.  VSS. Pt aaox4.

## 2019-05-13 NOTE — Consult Note (Signed)
Subjective: 1. Symptomatic anemia      Andrew Sullivan is a 49yo quadriparetic patient with a history of hematuria who I was asked to see in consultation by Dr. Caron Presume for evaluation of the hematuria.   Andrew Sullivan was seen in our office on 04/19/19 for his long history of intermittant gross hematuria.  He had a CT along with cystoscopy and retrogrades with ureteroscopy and biopsy in 2015 that was negative.  He had a CT and cystoscopy in 2018 that was negative.  He had intermittent hematuria at f/u in 8/19 and most recently on 2/9.  A CT was done on 05/04/19 and that showed some debris and plaque like thickening of the bladder base, particularly on the left.  He is scheduled for f/u in the office on 05/17/19.   He has a history of quadriparesis following a cervical spine injury but voids without difficulty and has retained sensation.  He has ED that responds to Sildenafil.   He has a history of DVT's and was previously on Xarelto but not for a few weeks.  The bleeding hasn't changed after stopping the med and he reports that it is like cranberry juice but without clots.  His Hgb on admission was 6.0 and came up to 6.4 after a unit.  His Hgb was 10.9 in 8/20 and 7.3 on 04/14/19.  He has no pain or voiding complaints.  ROS:  Review of Systems  Genitourinary: Positive for hematuria.  Neurological: Positive for weakness (in all 4 extremities).  All other systems reviewed and are negative.   Allergies  Allergen Reactions  . Penicillins Itching and Rash    Did it involve swelling of the face/tongue/throat, SOB, or low BP? No Did it involve sudden or severe rash/hives, skin peeling, or any reaction on the inside of your mouth or nose? Yes Did you need to seek medical attention at a hospital or doctor's office? Yes When did it last happen?Childhood If all above answers are "NO", may proceed with cephalosporin use.    Past Medical History:  Diagnosis Date  . Erectile dysfunction   . Gross hematuria   .  History of recurrent UTIs   . History of spinal cord injury    69 (age 64 fell from ladder) w/ cervical spine fx  s/p  c4 -- c6  fusion--  residual paraplegic bilateral legs (great below T8 uses w/c and can transfer self  . Paraparesis of both lower limbs ([place])    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 69 in 55  . Spastic tetraplegia ([place])     Past Surgical History:  Procedure Laterality Date  . CERVICAL FUSION  1988   C4 -- C6 w/ spinal cord injury  . CYSTOSCOPY WITH RETROGRADE PYELOGRAM, URETEROSCOPY AND STENT PLACEMENT Left 12/27/2013   Procedure: CYSTOSCOPY WITH RETROGRADE PYELOGRAM, URETEROSCOPY AND STENT PLACEMENT;  Surgeon: Reece Packer, MD;  Location: [facility];  Service: Urology;  Laterality: Left;  . CYSTOSCOPY/RETROGRADE/URETEROSCOPY Left 01/09/2014   Procedure: CYSTOSCOPY/RETROGRADE/URETEROSCOPY, URETERAL BIOPSY AND STENT EXCHANGE;  Surgeon: Alexis Frock, MD;  Location[facility];  Service: Urology;  Laterality: Left;  . HIP ARTHROPLASTY  08/12/2011   Procedure: ARTHROPLASTY BIPOLAR HIP;  Surgeon: Mauri Pole, MD;  Location: [facility];  Service: Orthopedics;  Laterality: Left;  Hemi-arthroplasty    Social History   Socioeconomic History  . Marital status: Single    Spouse name: Not on file  . Number of children: Not on file  . Years of education:  12  . Highest education level: 12th grade  Occupational History  . Not on file  Tobacco Use  . Smoking status: Former Smoker    Packs/day: 0.25    Years: 0.50    Pack years: 0.12    Types: Cigarettes    Quit date: 12/27/1986    Years since quitting: 32.3  . Smokeless tobacco: Never Used  Substance and Sexual Activity  . Alcohol use: No  . Drug use: No  . Sexual activity: Yes  Other Topics Concern  . Not on file  Social History Narrative   Lives alone in apartment. Ground level. No pets.1 brother 1 sister. Remains active.  Is on disability secondary to neck fracture. Stays active going to  gym and church. Uses [employer] for transportation. No smoke alarms.      Likes to eat chicken, rice, some greens, apples. Likes to drink water, milk, cranberry juice, unsweet tea.      No hobbies, likes to watch TV, goes to gym.    Social Determinants of Health   Financial Resource Strain:   . Difficulty of Paying Living Expenses: Not on file  Food Insecurity:   . Worried About Charity fundraiser in the Last Year: Not on file  . Ran Out of Food in the Last Year: Not on file  Transportation Needs:   . Lack of Transportation (Medical): Not on file  . Lack of Transportation (Non-Medical): Not on file  Physical Activity:   . Days of Exercise per Week: Not on file  . Minutes of Exercise per Session: Not on file  Stress:   . Feeling of Stress : Not on file  Social Connections:   . Frequency of Communication with Friends and Family: Not on file  . Frequency of Social Gatherings with Friends and Family: Not on file  . Attends Religious Services: Not on file  . Active Member of Clubs or Organizations: Not on file  . Attends Archivist Meetings: Not on file  . Marital Status: Not on file  Intimate Partner Violence:   . Fear of Current or Ex-Partner: Not on file  . Emotionally Abused: Not on file  . Physically Abused: Not on file  . Sexually Abused: Not on file    Family History  Problem Relation Age of Onset  . Arthritis Mother   . Hypertension Mother     Anti-infectives: Anti-infectives (From admission, onward)   None      Current Facility-Administered Medications  Medication Dose Route Frequency Provider Last Rate Last Admin  . acetaminophen (TYLENOL) tablet 650 mg  650 mg Oral Q6H PRN Stark Klein, MD       Or  . acetaminophen (TYLENOL) suppository 650 mg  650 mg Rectal Q6H PRN Stark Klein, MD      . baclofen (LIORESAL) tablet 20 mg  20 mg Oral QID Stark Klein, MD   20 mg at 05/13/19 1528  . pantoprazole (PROTONIX) EC tablet 20 mg  20 mg Oral Daily  Stark Klein, MD   20 mg at 05/13/19 1128     Objective: Vital signs in last 24 hours: Temp:  [97.7 F (36.5 C)-98.7 F (37.1 C)] 98.6 F (37 C) (03/05 1606) Pulse Rate:  [56-88] 69 (03/05 1606) Resp:  [14-20] 18 (03/05 1606) BP: (107-140)/(51-78) 125/59 (03/05 1606) SpO2:  [100 %] 100 % (03/05 1606) Weight:  [79.8 kg] 79.8 kg (03/05 0000)  Intake/Output from previous day: 03/04 0701 - 03/05 0700 In: 1315 [Blood:315; IV Piggyback:1000]  Out: -  Intake/Output this shift: Total I/O In: 82 [I.V.:50; Blood:32] Out: -    Physical Exam Vitals reviewed.  Constitutional:      Appearance: He is well-developed.  Abdominal:     General: There is no distension.     Palpations: Abdomen is soft. There is no mass.     Tenderness: There is no abdominal tenderness.  Neurological:     Mental Status: He is alert and oriented to person, place, and time.     Motor: Weakness (in all 4 extremities.) present.  Psychiatric:        Mood and Affect: Mood normal.        Behavior: Behavior normal.     Lab Results:  Results for orders placed or performed during the hospital encounter of 05/12/19 (from the past 24 hour(s))  CBC with Differential     Status: Abnormal   Collection Time: 05/12/19 11:16 PM  Result Value Ref Range   WBC 5.1 4.0 - 10.5 K/uL   RBC 3.34 (L) 4.22 - 5.81 MIL/uL   Hemoglobin 6.0 (LL) 13.0 - 17.0 g/dL   HCT 23.5 (L) 39.0 - 52.0 %   MCV 70.4 (L) 80.0 - 100.0 fL   MCH 18.0 (L) 26.0 - 34.0 pg   MCHC 25.5 (L) 30.0 - 36.0 g/dL   RDW 19.4 (H) 11.5 - 15.5 %   Platelets 328 150 - 400 K/uL   nRBC 0.4 (H) 0.0 - 0.2 %   Neutrophils Relative % 60 %   Neutro Abs 3.0 1.7 - 7.7 K/uL   Lymphocytes Relative 22 %   Lymphs Abs 1.1 0.7 - 4.0 K/uL   Monocytes Relative 14 %   Monocytes Absolute 0.7 0.1 - 1.0 K/uL   Eosinophils Relative 4 %   Eosinophils Absolute 0.2 0.0 - 0.5 K/uL   Basophils Relative 0 %   Basophils Absolute 0.0 0.0 - 0.1 K/uL   Immature Granulocytes 0 %    Abs Immature Granulocytes 0.02 0.00 - 0.07 K/uL  Comprehensive metabolic panel     Status: Abnormal   Collection Time: 05/12/19 11:16 PM  Result Value Ref Range   Sodium 141 135 - 145 mmol/L   Potassium 4.2 3.5 - 5.1 mmol/L   Chloride 110 98 - 111 mmol/L   CO2 23 22 - 32 mmol/L   Glucose, Bld 104 (H) 70 - 99 mg/dL   BUN 21 (H) 6 - 20 mg/dL   Creatinine, Ser 0.91 0.61 - 1.24 mg/dL   Calcium 8.6 (L) 8.9 - 10.3 mg/dL   Total Protein 6.2 (L) 6.5 - 8.1 g/dL   Albumin 3.5 3.5 - 5.0 g/dL   AST 31 15 - 41 U/L   ALT 19 0 - 44 U/L   Alkaline Phosphatase 46 38 - 126 U/L   Total Bilirubin 0.4 0.3 - 1.2 mg/dL   GFR calc non Af Amer >60 >60 mL/min   GFR calc Af Amer >60 >60 mL/min   Anion gap 8 5 - 15  SARS CORONAVIRUS 2 (TAT 6-24 HRS) Nasopharyngeal Nasopharyngeal Swab     Status: None   Collection Time: 05/12/19 11:17 PM   Specimen: Nasopharyngeal Swab  Result Value Ref Range   SARS Coronavirus 2 NEGATIVE NEGATIVE  Protime-INR     Status: None   Collection Time: 05/13/19 12:23 AM  Result Value Ref Range   Prothrombin Time 14.4 11.4 - 15.2 seconds   INR 1.1 0.8 - 1.2  Type and screen     Status:  None (Preliminary result)   Collection Time: 05/13/19 12:34 AM  Result Value Ref Range   ABO/RH(D) O POS    Antibody Screen NEG    Sample Expiration 05/16/2019,2359    Unit Number [id number]    Blood Component Type RED CELLS,LR    Unit division 00    Status of Unit ISSUED    Transfusion Status OK TO TRANSFUSE    Crossmatch Result Compatible    Unit Number [id number]    Blood Component Type RED CELLS,LR    Unit division 00    Status of Unit ISSUED    Transfusion Status OK TO TRANSFUSE    Crossmatch Result Compatible    Unit Number [id number]    Blood Component Type RBC LR PHER2    Unit division 00    Status of Unit ISSUED    Transfusion Status OK TO TRANSFUSE    Crossmatch Result      Compatible Performed at [facility], [place] [address], [place], [place] [address]    Prepare RBC     Status: None   Collection Time: 05/13/19 12:34 AM  Result Value Ref Range   Order Confirmation      ORDER PROCESSED BY BLOOD BANK Performed at [facility], [place] [address] [place], [place] [address]   Urinalysis, Routine w reflex microscopic     Status: Abnormal   Collection Time: 05/13/19  4:37 AM  Result Value Ref Range   Color, Urine YELLOW YELLOW   APPearance CLOUDY (A) CLEAR   Specific Gravity, Urine 1.026 1.005 - 1.030   pH 5.0 5.0 - 8.0   Glucose, UA NEGATIVE NEGATIVE mg/dL   Hgb urine dipstick LARGE (A) NEGATIVE   Bilirubin Urine NEGATIVE NEGATIVE   Ketones, ur NEGATIVE NEGATIVE mg/dL   Protein, ur 100 (A) NEGATIVE mg/dL   Nitrite NEGATIVE NEGATIVE   Leukocytes,Ua NEGATIVE NEGATIVE   RBC / HPF >50 (H) 0 - 5 RBC/hpf   WBC, UA 6-10 0 - 5 WBC/hpf   Bacteria, UA NONE SEEN NONE SEEN   Mucus PRESENT    Budding Yeast PRESENT   CBC     Status: Abnormal   Collection Time: 05/13/19  7:40 AM  Result Value Ref Range   WBC 4.2 4.0 - 10.5 K/uL   RBC 3.31 (L) 4.22 - 5.81 MIL/uL   Hemoglobin 6.4 (LL) 13.0 - 17.0 g/dL   HCT 24.0 (L) 39.0 - 52.0 %   MCV 72.5 (L) 80.0 - 100.0 fL   MCH 19.3 (L) 26.0 - 34.0 pg   MCHC 26.7 (L) 30.0 - 36.0 g/dL   RDW 21.2 (H) 11.5 - 15.5 %   Platelets 267 150 - 400 K/uL   nRBC 0.5 (H) 0.0 - 0.2 %  Prepare RBC     Status: None   Collection Time: 05/13/19  9:21 AM  Result Value Ref Range   Order Confirmation      ORDER PROCESSED BY BLOOD BANK Performed at [facility], [address], [place], [place] [address]     BMET Recent Labs    05/12/19 2316  NA 141  K 4.2  CL 110  CO2 23  GLUCOSE 104*  BUN 21*  CREATININE 0.91  CALCIUM 8.6*   PT/INR Recent Labs    05/13/19 0023  LABPROT 14.4  INR 1.1   ABG No results for input(s): PHART, HCO3 in the last 72 hours.  Invalid input(s): PCO2, PO2  Studies/Results: No results found. I have reviewed  his CT and labs and reviewed our prior office notes and  discussed the case with Dr. Caron Presume.   Assessment/Plan: Gross hematuria with anemia.   The bleeding is chronic, but intermittent and moderate without clots.  His Hgb only came up slightly with a single unit of PRBC's but his bleeding is not sufficiently severe to cause an acute drop in the Hgb and there could be a degree of dilution from IV fluid that prevented a significant rise.  Additional transfusion is ongoing.   He is scheduled to f/u in our office on 04/19/19 and will need cystoscopy to assess the bleeding at that time.         No follow-ups on file.    CC: Dr. Gifford Shave and Dr. Phebe Colla     Irine Seal 05/13/2019 [phone]

## 2019-05-13 NOTE — H&P (Addendum)
[facility] Admission History and Physical Service Pager: [phone]  Patient name: Andrew Sullivan Medical record number: [id number] Date of birth: 02-06-1971 Age: 49 y.o. Gender: male  Primary Care Provider: Nuala Alpha, DO Consultants: Urology  Code Status: Full Preferred Emergency Contact: Andrew Sullivan, mother, [phone]  Chief Complaint: HA   Assessment and Plan: Andrew Sullivan is a 49 y.o. male presenting with HA and symptomatic anemia. PMH is significant for hx of spinal cord injury, paraplegia, gross hematuria, LE muscle spasms and recurrent UTIs.  Symptomatic Anemia  Patient presents with HA since 2/24 that is primarily located around his forehead.  Patient also reports symptoms consistent with dizziness upon changing from supine to upright position.  Hemoglobin upon admission is 6.0 and was previously 7.34 weeks ago and 10.96 months ago.  Patient is noted to have history of chronic gross hematuria and is followed by urology as an outpatient.  Last visit for this problem was in February when he had an MRI.  Patient has appointment with urology on 3/9 to follow-up image results.  Patient denies melena, bloody stools, does report intermittent palpitations but not currently and denies any shortness of breath.  Heart rate is within normal limits and on exam patient has mildly delayed capillary refill with no conjunctival pallor, blood pressure remains normotensive with some elevated systolic pressures to max of 149.  In general, patient does not appear pale.  Patient continues to have large hemoglobin and greater than 50 red blood cells on urine analysis.  Does report having regular urine output that is cranberry colored.  Most likely patient's acute anemia is secondary to continue blood loss due to hematuria as patient denies symptoms of GI blood loss.  She requires admission for blood transfusion and improve hemoglobin. -Admit to MedSurg, attending  Andrew Sullivan -Transfuse 2 units packed red blood cells -Check post transfusion hemoglobin -Monitor CBC -orthostatic vital signs  -vitals per floor protocol  -up with assistance   Headache, improved   Patient's headache is not associated with visual changes nor nausea or emesis. Believe this is likely due to hypovolemia in setting of acute blood loss. Will manage pain with Tylenol. Patient tolerated a dose of 30mg  Toradol while in the ED. Could consider an additional dose while admitted if HA persists.  -Tylenol 650 mg q4 PRN   Chronic Gross hematuria  Andrew Sullivan follows with urology, Andrew Sullivan for management of his gross hematuria. Patient recently had a MRI on 3/24 and has appointment on 3/9 for results follow up. Patient reports history of gross "blood streaked" urine that has been intermittent and long standing. After starting Xarelto his urine changed to a "cranberry" color. He was referred to Urology and has been managed by Andrew Sullivan. He is not sure what all has been done for his hematuria but states he did have an MRI. Urology notes not available in [place]. U/A with reflex microscopy on admission shows large hemoglobin, 100 protein and greater than 50 red blood cells. -Consult urology, patient recommendations -Urine culture  Hx of DVT  previously on Xarelto Andrew Sullivan was found to have DVT in right femoral, popliteal and posterior tibial veins as well as right peroneal vein on ultrasound in Aug of 2020. He was last prescribed Xarelto in Nov of 2020 and currently reports no longer taking this medication.   -SCDs in setting of acute bleed and anemia   Paraplegia with muscle spasms Andrew Sullivan is wheelchair bound due to paraplegia. Upon chart  review, patient is noted to have chronic LE edema. On admission, he is noted to have trace edema on LLE>right. Patient exhibits muscle spasms in bilateral lower extremities during admission PE. Home medications include baclofen 20 mg four times daily   -continue home baclofen   GERD? Patient presented to ED with chest pain recently and was prescribed omeprazole to help with chest discomfort. Patient reports not taking this at home but will order in case the patient reports chest discomfort consistent with reflux symptoms given negative cardiac evaluation in Feb of 2021.  -continue Protonix 20 mg daily while admitted   FEN/GI: regular diet   Prophylaxis: SCDs  Disposition: admit to med surg   History of Present Illness:  Andrew Sullivan is a 49 y.o. male presenting with HA for the past 8 days that has been located primarily in his forehead.  Patient states that since Feb 24th, he has been having daily headaches that are slightly improved after eating. States that his headaches are similar to "hungry headaches". He reports that he has been eating and drinking normally despite at times having less of an appetite as usual. He says his vision has been blurry since this time as well. Pain is rated as 6/10. Also endorsing palpitations, feels weak. Patient denies any sore throat, does endorse a dry cough. Headache is located in the front of his head and does not radiate anywhere else. Denies any shortness of breath, abdominal pain, nausea, vomiting, diarrhea, or constipation. No dark stools or bloody stools. He reports no dysuria but states that the color of urine has changed from yellow with streaks of blood to now cranberry colored that he first noticed this soon after starting Xarelto. He was started on that for a DVT and took it for 6 month, records show Xarelto was last prescribed in Nov 2020.  For his hematuria, he follows with his urologist, Andrew Sullivan  ED course:  Patient received a dose of Toradol 30mg , and 1 liter bolus of NS. Patient received 1 unit of pRBCs with post transfusion hemoglobin ordered.   Review Of Systems: Per HPI with the following additions:   Review of Systems  Constitutional: Positive for malaise/fatigue. Negative  for chills and fever.  HENT: Negative for congestion, sinus pain and sore throat.   Eyes: Positive for blurred vision.  Respiratory: Positive for cough. Negative for hemoptysis, sputum production, shortness of breath and wheezing.   Cardiovascular: Positive for palpitations. Negative for chest pain.  Gastrointestinal: Negative for abdominal pain, blood in stool, constipation, diarrhea, melena, nausea and vomiting.  Genitourinary: Positive for hematuria. Negative for dysuria, frequency and urgency.  Musculoskeletal: Negative for myalgias.  Neurological: Positive for weakness and headaches. Negative for dizziness, focal weakness and loss of consciousness.    Patient Active Problem List   Diagnosis Date Noted  . Symptomatic anemia 05/13/2019  . DVT (deep venous thrombosis) ([place]) 10/19/2018  . Erectile dysfunction   . Spastic tetraplegia ([place]) 12/06/2013  . Normocytic anemia 11/05/2013  . History of recurrent UTIs 05/19/2010  . Functional quadriplegia ([place]) 05/10/2009    Past Medical History: Past Medical History:  Diagnosis Date  . Erectile dysfunction   . Gross hematuria   . History of recurrent UTIs   . History of spinal cord injury    42 (age 74 fell from ladder) w/ cervical spine fx  s/p  c4 -- c6  fusion--  residual paraplegic bilateral legs (great below T8 uses w/c and can transfer self  . Paraparesis of  both lower limbs ([place])    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 3 in 54  . Spastic tetraplegia ([place])     Past Surgical History: Past Surgical History:  Procedure Laterality Date  . CERVICAL FUSION  1988   C4 -- C6 w/ spinal cord injury  . CYSTOSCOPY WITH RETROGRADE PYELOGRAM, URETEROSCOPY AND STENT PLACEMENT Left 12/27/2013   Procedure: CYSTOSCOPY WITH RETROGRADE PYELOGRAM, URETEROSCOPY AND STENT PLACEMENT;  Surgeon: Reece Packer, MD;  Location: [facility];  Service: Urology;  Laterality: Left;  . CYSTOSCOPY/RETROGRADE/URETEROSCOPY Left  01/09/2014   Procedure: CYSTOSCOPY/RETROGRADE/URETEROSCOPY, URETERAL BIOPSY AND STENT EXCHANGE;  Surgeon: Alexis Frock, MD;  [facility];  Service: Urology;  Laterality: Left;  . HIP ARTHROPLASTY  08/12/2011   Procedure: ARTHROPLASTY BIPOLAR HIP;  Surgeon: Mauri Pole, MD;  [facility];  Service: Orthopedics;  Laterality: Left;  Hemi-arthroplasty    Social History: Social History   Tobacco Use  . Smoking status: Former Smoker    Packs/day: 0.25    Years: 0.50    Pack years: 0.12    Types: Cigarettes    Quit date: 12/27/1986    Years since quitting: 32.3  . Smokeless tobacco: Never Used  Substance Use Topics  . Alcohol use: No  . Drug use: No    Family History: Family History  Problem Relation Age of Onset  . Arthritis Mother   . Hypertension Mother     Allergies and Medications: Allergies  Allergen Reactions  . Penicillins Itching and Rash    Did it involve swelling of the face/tongue/throat, SOB, or low BP? No Did it involve sudden or severe rash/hives, skin peeling, or any reaction on the inside of your mouth or nose? Yes Did you need to seek medical attention at a hospital or doctor's office? Yes When did it last happen?Childhood If all above answers are "NO", may proceed with cephalosporin use.   No current facility-administered medications on file prior to encounter.   Current Outpatient Medications on File Prior to Encounter  Medication Sig Dispense Refill  . baclofen (LIORESAL) 20 MG tablet Take 1 tablet by mouth 4 times daily (Patient taking differently: Take 20 mg by mouth 4 (four) times daily. ) 120 tablet 0  . omeprazole (PRILOSEC) 20 MG capsule Take 1 capsule (20 mg total) by mouth daily. (Patient not taking: Reported on 05/12/2019) 14 capsule 0  . rivaroxaban (XARELTO) 20 MG TABS tablet Take 1 tablet (20 mg total) by mouth daily with supper. With a meal (Patient not taking: Reported on 05/12/2019) 90 tablet 1    Objective: BP 107/67 (BP  Location: Right Arm)   Pulse 67   Temp 98.7 F (37.1 C) (Oral)   Resp 14   Ht 6\' 2"  (1.88 m)   Wt 79.8 kg   SpO2 100%   BMI 22.60 kg/m   Exam: General: male appearing stated age in NAD, non-ill appearing, sitting up in bed  Eyes: non scleral icterus, no conjunctival injection, no conjunctival pallor  Neck: supple with normal ROM  Cardiovascular: RRR without murmur, gallops or friction rubs Respiratory: CTAB without wheezing, increased WOB, crackles or rhonchi, patient is stable on RA  Gastrointestinal: soft, non-tender with bowel sounds present throughout  MSK: non-mobile bilateral LE Derm: no lesions or ulcerations noted, skin does not appear pale  Neuro: alert and oriented x4   Labs and Imaging: CBC BMET  Recent Labs  Lab 05/12/19 2316  WBC 5.1  HGB 6.0*  HCT 23.5*  PLT  328   Recent Labs  Lab 05/12/19 2316  NA 141  K 4.2  CL 110  CO2 23  BUN 21*  CREATININE 0.91  GLUCOSE 104*  CALCIUM 8.6*     EKG: no study for this admission   Stark Klein, MD 05/13/2019, 6:15 AM PGY-1, [place] Intern pager: [phone], text pages welcome  Resident Attestation   I saw and evaluated the patient, performing the key elements of the service. I personally performed or re-performed the history, physical exam, and medical decision making activities of this service and have verified that the service and findings are accurately documented in the resident's note. I developed the management plan that is described in the resident's note, and I agree with the content, with my edits above in red.   Harolyn Rutherford, DO [facility], PGY-3

## 2019-05-14 DIAGNOSIS — D649 Anemia, unspecified: Secondary | ICD-10-CM | POA: Diagnosis not present

## 2019-05-14 LAB — CBC
HCT: 32 % — ABNORMAL LOW (ref 39.0–52.0)
Hemoglobin: 9 g/dL — ABNORMAL LOW (ref 13.0–17.0)
MCH: 21 pg — ABNORMAL LOW (ref 26.0–34.0)
MCHC: 28.1 g/dL — ABNORMAL LOW (ref 30.0–36.0)
MCV: 74.8 fL — ABNORMAL LOW (ref 80.0–100.0)
Platelets: 308 10*3/uL (ref 150–400)
RBC: 4.28 MIL/uL (ref 4.22–5.81)
RDW: 21.8 % — ABNORMAL HIGH (ref 11.5–15.5)
WBC: 5.7 10*3/uL (ref 4.0–10.5)
nRBC: 0 % (ref 0.0–0.2)

## 2019-05-14 LAB — BASIC METABOLIC PANEL
Anion gap: 6 (ref 5–15)
BUN: 13 mg/dL (ref 6–20)
CO2: 25 mmol/L (ref 22–32)
Calcium: 8.6 mg/dL — ABNORMAL LOW (ref 8.9–10.3)
Chloride: 110 mmol/L (ref 98–111)
Creatinine, Ser: 0.87 mg/dL (ref 0.61–1.24)
GFR calc Af Amer: >60 mL/min (ref >60)
GFR calc non Af Amer: >60 mL/min (ref >60)
Glucose, Bld: 90 mg/dL (ref 70–99)
Potassium: 3.9 mmol/L (ref 3.5–5.1)
Sodium: 141 mmol/L (ref 135–145)

## 2019-05-14 LAB — TYPE AND SCREEN
ABO/RH(D): O POS
Antibody Screen: NEGATIVE
Unit division: 0
Unit division: 0
Unit division: 0

## 2019-05-14 LAB — BPAM RBC
Blood Product Expiration Date: 202103282359
Blood Product Expiration Date: 202103292359
Blood Product Expiration Date: 202104032359
ISSUE DATE / TIME: 202103050204
ISSUE DATE / TIME: 202103051028
ISSUE DATE / TIME: 202103051546
Unit Type and Rh: 5100
Unit Type and Rh: 5100
Unit Type and Rh: 5100

## 2019-05-14 LAB — GLUCOSE, CAPILLARY: Glucose-Capillary: 101 mg/dL — ABNORMAL HIGH (ref 70–99)

## 2019-05-14 MED ORDER — IBUPROFEN 400 MG PO TABS
600.0000 mg | ORAL_TABLET | Freq: Once | ORAL | Status: AC
  Administered 2019-05-14: 600 mg via ORAL
  Filled 2019-05-14: qty 1

## 2019-05-14 MED ORDER — ACETAMINOPHEN 325 MG PO TABS
650.0000 mg | ORAL_TABLET | Freq: Once | ORAL | Status: AC
  Administered 2019-05-14: 650 mg via ORAL
  Filled 2019-05-14: qty 2

## 2019-05-14 NOTE — Progress Notes (Signed)
Family Medicine Teaching Service Daily Progress Note Intern Pager: [phone]  Patient name: Andrew Sullivan Medical record number: [id number] Date of birth: June 17, 1970 Age: 49 y.o. Gender: male  Primary Care Provider: Nuala Alpha, DO Consultants: Urology Code Status: Full  Pt Overview and Major Events to Date:  Andrew Sullivan is a 49 y.o. male presenting with HA and symptomatic anemia. PMH is significant for hx of spinal cord injury, paraplegia, gross hematuria, LE muscle spasms and recurrent UTIs.  Assessment and Plan:  Symptomatic Anemia secondary to gross hematuria Acute on chronic, stable. Hematuria confirmed with U/A and microscopy. Patient presented with HA and dizziness most likely secondary to his anemia intermittently occuring since 2/24 that is primarily located around his forehead that is ongoing. Dizziness has resolved. Hemoglobin upon admission is 6.0 and has received a total of 3U of pRBC with Hemoglobin increase to 8.2.  Patient has appointment with urology on 3/9 to follow-up image results and have another cystocopy to further evaluate the source of bleeding. Patient continues to deny melena, bloody stools. He had a CT Abd/Pelvis in February that showed a debris vs plaque like thickening at the left bladder base. -Hemoglobin stable, repeat H/H this am and if patient remains asymptomatic and H/H is stable with no further bleeding could discharge later today. -Consult urology, patient has follow up appointment outpatient on 3/9 and will have cystoscopy outpatient at that time. -Urine culture showed less than <10k colonies so insignificant growth; no antibiotics indicated. - Given Ibuprofen and Tylenol one dose this am to help with headache  Hx of DVT  previously on Xarelto Andrew Sullivan was found to have DVT in right femoral, popliteal and posterior tibial veins as well as right peroneal vein on ultrasound in Aug of 2020. He was last prescribed Xarelto in Nov of 2020 and  currently reports no longer taking this medication after completing 6 months. In light of his gross hematuria leading to symptomatic anemia we will not continue his Xarelto even though he continues to have risk for repeat DVTs  Paraplegia with muscle spasms Andrew Sullivan is wheelchair bound due to paraplegia. Upon chart review, patient is noted to have chronic LE edema. On admission, he is noted to have trace edema on LLE>right. Patient exhibits muscle spasms in bilateral lower extremities during admission PE. Home medications include baclofen 20 mg four times daily  -continue home baclofen   GERD Patient presented to ED with chest pain recently and was prescribed omeprazole to help with chest discomfort. Patient reports not taking this at home but will order in case the patient reports chest discomfort consistent with reflux symptoms given negative cardiac evaluation in Feb of 2021.  -continue Protonix 20 mg daily; restart omeprazole home dose on discharge.   FEN/GI: reg diet PPx: SCDs  Disposition: home  Subjective:  Patient feels well this am, he has resolution of his dizziness but still has a headache. It has not changed and feels the same as the one when he came in. I discussed with him if his hemoglobin levels are stable and he feels well enough to go home he can be discharged safely today. However, if his headache does not respond to Ibuprofen or Tylenol then he can stay. He is agreeable to the plan and has no other concerns.  Objective: Temp:  [97.7 F (36.5 C)-98.8 F (37.1 C)] 97.9 F (36.6 C) (03/06 0004) Pulse Rate:  [56-74] 60 (03/06 0004) Resp:  [14-20] 16 (03/06 0004) BP: (104-140)/(51-78) 104/71 (03/06 0004)  SpO2:  [100 %] 100 % (03/05 1606) Physical Exam: Gen: Alert and Oriented x 3, NAD CV: RRR, no murmurs, normal S1, S2 split Resp: CTAB, no wheezing, rales, or rhonchi, comfortable work of breathing Abd: non-distended, non-tender, soft Skin: warm, dry, intact, no  rashes  Laboratory: Recent Labs  Lab 05/12/19 2316 05/13/19 0740 05/13/19 2005  WBC 5.1 4.2  --   HGB 6.0* 6.4* 8.2*  HCT 23.5* 24.0* 29.1*  PLT 328 267  --    Recent Labs  Lab 05/12/19 2316  NA 141  K 4.2  CL 110  CO2 23  BUN 21*  CREATININE 0.91  CALCIUM 8.6*  PROT 6.2*  BILITOT 0.4  ALKPHOS 46  ALT 19  AST 31  GLUCOSE 104*    Imaging/Diagnostic Tests: None  Nuala Alpha, DO 05/14/2019, 12:31 AM PGY-3, [place] Intern pager: [phone], text pages welcome

## 2019-05-14 NOTE — Discharge Instructions (Signed)
Please follow up with urology on 3/9.  [facility] [place] [address]  Anemia  Anemia is a condition in which you do not have enough red blood cells or hemoglobin. Hemoglobin is a substance in red blood cells that carries oxygen. When you do not have enough red blood cells or hemoglobin (are anemic), your body cannot get enough oxygen and your organs may not work properly. As a result, you may feel very tired or have other problems. What are the causes? Common causes of anemia include:  Excessive bleeding. Anemia can be caused by excessive bleeding inside or outside the body, including bleeding from the intestine or from periods in women.  Poor nutrition.  Long-lasting (chronic) kidney, thyroid, and liver disease.  Bone marrow disorders.  Cancer and treatments for cancer.  HIV (human immunodeficiency virus) and AIDS (acquired immunodeficiency syndrome).  Treatments for HIV and AIDS.  Spleen problems.  Blood disorders.  Infections, medicines, and autoimmune disorders that destroy red blood cells. What are the signs or symptoms? Symptoms of this condition include:  Minor weakness.  Dizziness.  Headache.  Feeling heartbeats that are irregular or faster than normal (palpitations).  Shortness of breath, especially with exercise.  Paleness.  Cold sensitivity.  Indigestion.  Nausea.  Difficulty sleeping.  Difficulty concentrating. Symptoms may occur suddenly or develop slowly. If your anemia is mild, you may not have symptoms. How is this diagnosed? This condition is diagnosed based on:  Blood tests.  Your medical history.  A physical exam.  Bone marrow biopsy. Your health care provider may also check your stool (feces) for blood and may do additional testing to look for the cause of your bleeding. You may also have other tests, including:  Imaging tests, such as a CT scan or MRI.  Endoscopy.  Colonoscopy. How is this  treated? Treatment for this condition depends on the cause. If you continue to lose a lot of blood, you may need to be treated at a hospital. Treatment may include:  Taking supplements of iron, vitamin K27, or folic acid.  Taking a hormone medicine (erythropoietin) that can help to stimulate red blood cell growth.  Having a blood transfusion. This may be needed if you lose a lot of blood.  Making changes to your diet.  Having surgery to remove your spleen. Follow these instructions at home:  Take over-the-counter and prescription medicines only as told by your health care provider.  Take supplements only as told by your health care provider.  Follow any diet instructions that you were given.  Keep all follow-up visits as told by your health care provider. This is important. Contact a health care provider if:  You develop new bleeding anywhere in the body. Get help right away if:  You are very weak.  You are short of breath.  You have pain in your abdomen or chest.  You are dizzy or feel faint.  You have trouble concentrating.  You have bloody or black, tarry stools.  You vomit repeatedly or you vomit up blood. Summary  Anemia is a condition in which you do not have enough red blood cells or enough of a substance in your red blood cells that carries oxygen (hemoglobin).  Symptoms may occur suddenly or develop slowly.  If your anemia is mild, you may not have symptoms.  This condition is diagnosed with blood tests as well as a medical history and physical exam. Other tests may be needed.  Treatment for this condition depends  on the cause of the anemia. This information is not intended to replace advice given to you by your health care provider. Make sure you discuss any questions you have with your health care provider. Document Revised: 02/06/2017 Document Reviewed: 03/28/2016 Elsevier Patient Education  [place].

## 2019-05-14 NOTE — Care Management Obs Status (Signed)
[facility] NOTIFICATION   Patient Details  Name: Andrew Sullivan MRN: [id number] Date of Birth: 1970/09/14   Medicare Observation Status Notification Given:  Yes    Carles Collet, RN 05/14/2019, 8:47 AM

## 2019-05-14 NOTE — Progress Notes (Signed)
Pt given discharge summary and vitals WDL. Pt is waiting for ride to pick him up.

## 2019-05-15 NOTE — Discharge Summary (Signed)
[facility] Discharge Summary  Patient name: Andrew Sullivan Medical record number: [id number] Date of birth: January 24, 1971 Age: 49 y.o. Gender: male Date of Admission: 05/12/2019  Date of Discharge: 05/14/2019 Admitting Physician: Stark Klein, MD  Primary Care Provider: Nuala Alpha, DO Consultants: Urology  Indication for Hospitalization:   Symptomatic Anemia Hematuria  Discharge Diagnoses/Problem List:  Symptomatic Anemia Hematuria Hx of DVT Paraplegia GERD Paraplegia  Disposition: home  Discharge Condition: stable  Discharge Exam:  Gen: Alert and Oriented x 3, NAD CV: RRR, no murmurs, normal S1, S2 split Resp: CTAB, no wheezing, rales, or rhonchi, comfortable work of breathing Abd: non-distended, non-tender, soft Skin: warm, dry, intact, no rashes  Brief Hospital Course:  Andrew Sullivan is a 48y/o male with PMH of DVT, Hematuria, paraplegia due to traumatic spinal injury, and GERD. He presented to the ED due to fatigue, headache, and overall weakness. Initially, he was concerned he had been exposed to [place] after he was at [facility] getting a scan done outpatient ordered by his Urologist. On ED workup he was found to be anemic with a hemoglobin of 6.0. He tested negative for COVID. He was admitted, given 3U total of pRBCs and his hemoglobin improved to 9.0. He was seen and evaluated by Urology and it was determined that he would have an outpatient cystoscopy. His CT of his abdomen and pelvis done outpatient at [facility] did show some abnormalities in his bladder. His overall fatigue and weakness improved and his headache (which was not unlike previous headaches) resolved with Tylenol and Ibuprofen. His vital signs remained stable for the duration of his admission. He was agreeable for discharge on 3/6.  Issues for Follow Up:  1. Repeat CBC in clinic and ask about hematuria 2. Given his symptomatic anemia he has discontinued Plavix and I  agree. We should avoid any anticoagulation medications unless he has another clot.  Significant Procedures:   None  Significant Labs and Imaging:  Recent Labs  Lab 05/12/19 2316 05/12/19 2316 05/13/19 0740 05/13/19 2005 05/14/19 0735  WBC 5.1  --  4.2  --  5.7  HGB 6.0*   < > 6.4* 8.2* 9.0*  HCT 23.5*   < > 24.0* 29.1* 32.0*  PLT 328  --  267  --  308   < > = values in this interval not displayed.   Recent Labs  Lab 05/12/19 2316 05/14/19 0735  NA 141 141  K 4.2 3.9  CL 110 110  CO2 23 25  GLUCOSE 104* 90  BUN 21* 13  CREATININE 0.91 0.87  CALCIUM 8.6* 8.6*  ALKPHOS 46  --   AST 31  --   ALT 19  --   ALBUMIN 3.5  --      Results/Tests Pending at Time of Discharge:   None  Discharge Medications:  Allergies as of 05/14/2019      Reactions   Penicillins Itching, Rash   Did it involve swelling of the face/tongue/throat, SOB, or low BP? No Did it involve sudden or severe rash/hives, skin peeling, or any reaction on the inside of your mouth or nose? Yes Did you need to seek medical attention at a hospital or doctor's office? Yes When did it last happen?Childhood If all above answers are "NO", may proceed with cephalosporin use.      Medication List    TAKE these medications   baclofen 20 MG tablet Commonly known as: LIORESAL Take 1 tablet by mouth 4 times daily  What changed: See the new instructions. Notes to patient: 05/14/19       Discharge Instructions: Please refer to Patient Instructions section of EMR for full details.  Patient was counseled important signs and symptoms that should prompt return to medical care, changes in medications, dietary instructions, activity restrictions, and follow up appointments.   Follow-Up Appointments: Follow-up Information    Alexis Frock, MD Follow up on 05/17/2019.   Specialty: Urology Why: Keep appointment as scheduled.  Contact information: [place] [address] [phone]         Nuala Alpha, DO. Schedule an appointment as soon as possible for a visit.   Specialty: Family Medicine Why: Please call the [facility] and make an appointment to be seen within one week Contact information: [id number] [place] [address] [place], [place], DO 05/15/2019, 8:26 PM PGY-3, [place]

## 2019-05-17 DIAGNOSIS — R310 Gross hematuria: Secondary | ICD-10-CM | POA: Diagnosis not present

## 2019-05-18 ENCOUNTER — Telehealth: Payer: Self-pay | Admitting: Family Medicine

## 2019-05-18 NOTE — Telephone Encounter (Signed)
Pt is calling and would like to have a referral sent to Out patient physical therapy. He said he has discussed this with Dr. Garlan Fillers.   Please advise.

## 2019-05-19 ENCOUNTER — Ambulatory Visit: Payer: Medicare HMO | Admitting: Family Medicine

## 2019-05-20 NOTE — Telephone Encounter (Signed)
Pt is calling back to check the status of his request to be referred to [facility]

## 2019-05-22 ENCOUNTER — Other Ambulatory Visit: Payer: Self-pay | Admitting: Family Medicine

## 2019-05-22 DIAGNOSIS — R532 Functional quadriplegia: Secondary | ICD-10-CM

## 2019-05-22 NOTE — Progress Notes (Signed)
Ambulatory referral to outpatient physical therapy

## 2019-05-23 NOTE — Telephone Encounter (Signed)
Attempted to call patient concerning referral.  There was no answer and no ability to leave a message.  If patient calls please inform him that referral has been placed.  Ozella Almond, [place]

## 2019-06-06 ENCOUNTER — Ambulatory Visit (INDEPENDENT_AMBULATORY_CARE_PROVIDER_SITE_OTHER): Payer: Medicare HMO | Admitting: Family Medicine

## 2019-06-06 ENCOUNTER — Other Ambulatory Visit: Payer: Self-pay

## 2019-06-06 VITALS — BP 120/75 | HR 59

## 2019-06-06 DIAGNOSIS — R532 Functional quadriplegia: Secondary | ICD-10-CM

## 2019-06-06 DIAGNOSIS — D649 Anemia, unspecified: Secondary | ICD-10-CM

## 2019-06-06 NOTE — Patient Instructions (Signed)
It was great to see you today! Thank you for letting me participate in your care!  Today, we discussed your recent anemia due to hematuria. I am glad your urinary bleeding has stopped. If you blood is low today I will call you.  Be well, Harolyn Rutherford, DO PGY-3, [facility]

## 2019-06-06 NOTE — Progress Notes (Signed)
    SUBJECTIVE:   CHIEF COMPLAINT / HPI:   Hosp f/u for anemia Patient was recently hospitalized for symptomatic anemia and received blood transfusion. His symptoms of anemia which were fatigue and weakness have resolved. He had gross hematuria before and during admission with no other known source for bleeding. He denies hematochezia, frank blood in the stools, or dark stools. He continues to decline a colonoscopy and rectal exam. He was seen by urology outpatient for his hematuria and since it had resolved by the time of his appointment they did not do a cystoscopy. He was told to return to Urology if it returned.  PERTINENT  PMH / PSH: hematuria  OBJECTIVE:   BP 120/75   Pulse (!) 59   SpO2 100%   Gen: NAD Cardio: RRR, no murmurs Resp: CTAB  ASSESSMENT/PLAN:   Symptomatic anemia Resolution of bleeding and declines further workup with colonoscopy and rectal exam. I would keep offering to patient especially if he continues to be anemic. - F/u CBC today shows continued but stable anemia - F/u in 2 months, sooner if symptoms return     Nuala Alpha, [place]

## 2019-06-07 LAB — CBC
Hematocrit: 31.1 % — ABNORMAL LOW (ref 37.5–51.0)
Hemoglobin: 8.9 g/dL — ABNORMAL LOW (ref 13.0–17.7)
MCH: 20.6 pg — ABNORMAL LOW (ref 26.6–33.0)
MCHC: 28.6 g/dL — ABNORMAL LOW (ref 31.5–35.7)
MCV: 72 fL — ABNORMAL LOW (ref 79–97)
Platelets: 240 10*3/uL (ref 150–450)
RBC: 4.31 x10E6/uL (ref 4.14–5.80)
RDW: 21 % — ABNORMAL HIGH (ref 11.6–15.4)
WBC: 4.8 10*3/uL (ref 3.4–10.8)

## 2019-06-07 NOTE — Assessment & Plan Note (Signed)
Resolution of bleeding and declines further workup with colonoscopy and rectal exam. I would keep offering to patient especially if he continues to be anemic. - F/u CBC today shows continued but stable anemia - F/u in 2 months, sooner if symptoms return

## 2019-06-15 ENCOUNTER — Telehealth: Payer: Self-pay | Admitting: Physical Therapy

## 2019-06-15 NOTE — Telephone Encounter (Signed)
Hello Dr. Garlan Fillers, We recently performed a power wheelchair evaluation for Andrew Sullivan and submitted the LMN to [employer].  In order for [employer] to proceed with submitting the wheelchair quote and LMN to insurance they need further documentation from you as the referring physician.  They need the face to face visit note from 01/25/19 which documents your recommendation for a power wheelchair evaluation and they will need to you sign off on the LMN and our equipment recommendations.    The patient has signed a release of information so that we can communicate with [employer] and provide them with any paperwork they need for the insurance approval process.    If possible, would you be able to fax them your face to face note from 11/17 or if you prefer, I have in the past provided these notes to the [employer]. Obtaining a power wheelchair is a long process and in order to not delay it any further for Andrew Sullivan please let me know as soon as you can what your prefer.   Thank you for your assistance! Rico Junker, PT, DPT 06/15/19    9:03 AM

## 2019-06-17 ENCOUNTER — Other Ambulatory Visit: Payer: Self-pay | Admitting: Family Medicine

## 2019-06-17 DIAGNOSIS — G8250 Quadriplegia, unspecified: Secondary | ICD-10-CM

## 2019-06-17 DIAGNOSIS — R532 Functional quadriplegia: Secondary | ICD-10-CM

## 2019-07-04 ENCOUNTER — Other Ambulatory Visit: Payer: Self-pay

## 2019-07-04 ENCOUNTER — Ambulatory Visit: Payer: Medicare HMO | Attending: Family Medicine

## 2019-07-04 DIAGNOSIS — R293 Abnormal posture: Secondary | ICD-10-CM | POA: Diagnosis not present

## 2019-07-04 DIAGNOSIS — M62838 Other muscle spasm: Secondary | ICD-10-CM | POA: Diagnosis not present

## 2019-07-04 DIAGNOSIS — G8254 Quadriplegia, C5-C7 incomplete: Secondary | ICD-10-CM | POA: Insufficient documentation

## 2019-07-04 DIAGNOSIS — R262 Difficulty in walking, not elsewhere classified: Secondary | ICD-10-CM

## 2019-07-04 DIAGNOSIS — R296 Repeated falls: Secondary | ICD-10-CM | POA: Diagnosis not present

## 2019-07-04 NOTE — Therapy (Signed)
[place], [place], [address] Phone: [phone]   Fax:  [phone]  Physical Therapy Treatment  Patient Details  Name: Andrew Sullivan MRN: [id number] Date of Birth: Jun 05, 1970 Referring Provider (PT): Madison Hickman, MD   Encounter Date: 07/04/2019  PT End of Session - 07/04/19 1210    Visit Number  1    Number of Visits  10    Date for PT Re-Evaluation  08/05/19    Authorization Type  [employer] Medicare/Medicaid    PT Start Time  1215    PT Stop Time  1300    PT Time Calculation (min)  45 min    Activity Tolerance  Patient tolerated treatment well    Behavior During Therapy  [facility] for tasks assessed/performed       Past Medical History:  Diagnosis Date  . Erectile dysfunction   . Gross hematuria   . History of recurrent UTIs   . History of spinal cord injury    26 (age 28 fell from ladder) w/ cervical spine fx  s/p  c4 -- c6  fusion--  residual paraplegic bilateral legs (great below T8 uses w/c and can transfer self  . Paraparesis of both lower limbs ([place])    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 12 in 30  . Spastic tetraplegia ([place])     Past Surgical History:  Procedure Laterality Date  . CERVICAL FUSION  1988   C4 -- C6 w/ spinal cord injury  . CYSTOSCOPY WITH RETROGRADE PYELOGRAM, URETEROSCOPY AND STENT PLACEMENT Left 12/27/2013   Procedure: CYSTOSCOPY WITH RETROGRADE PYELOGRAM, URETEROSCOPY AND STENT PLACEMENT;  Surgeon: Reece Packer, MD;  Location: [place];  Service: Urology;  Laterality: Left;  . CYSTOSCOPY/RETROGRADE/URETEROSCOPY Left 01/09/2014   Procedure: CYSTOSCOPY/RETROGRADE/URETEROSCOPY, URETERAL BIOPSY AND STENT EXCHANGE;  Surgeon: Alexis Frock, MD;  [facility];  Service: Urology;  Laterality: Left;  . HIP ARTHROPLASTY  08/12/2011   Procedure: ARTHROPLASTY BIPOLAR HIP;  Surgeon: Mauri Pole, MD;  Location[facility];  Service: Orthopedics;   Laterality: Left;  Hemi-arthroplasty    There were no vitals filed for this visit.  Subjective Assessment - 07/04/19 1237    Subjective  He report hamstrings bothering him again.   He has not tried botox or other intervention lately.   He rports he can walk  50-100 feet. He walks at home.    He is getting new power chair.   Walking with spasm and falls occaisonally.    Pertinent History  history of spinal cord injury in 1988 - fall from ladder causing C4-C6 resulting in spastic incomplete tetraplegia, cervical fusion, recurrent UTI, falls with L hip fracture requiring hip arthroplasty    Limitations  Walking    Patient Stated Goals  To decr pain and walk better    Pain Score  3    at rest   Pain Location  --   post thihs   Pain Orientation  Right;Left;Posterior    Pain Descriptors / Indicators  Aching;Sore    Pain Type  Chronic pain    Pain Onset  More than a month ago    Pain Frequency  Constant    Aggravating Factors   nothing specific    Pain Relieving Factors  nothing specific . Gym helped         [facility] PT Assessment - 07/04/19 0001      Assessment   Medical Diagnosis  Spastic quadraplegia    Referring Provider (  PT)  Madison Hickman, MD    Onset Date/Surgical Date  01/26/19    Prior Therapy  yes      Precautions   Precaution Comments  history of spinal cord injury in 1988 - fall from ladder causing C4-C6 resulting in spastic incomplete tetraplegia, cervical fusion, recurrent UTI, falls with L hip fracture requiring hip arthroplasty      Balance Screen   Has the patient fallen in the past 6 months  Yes    How many times?  3-4   walking   Has the patient had a decrease in activity level because of a fear of falling?   Yes    Is the patient reluctant to leave their home because of a fear of falling?   No      Prior Function   Level of Independence  Independent with household mobility with device;Independent with community mobility with device;Independent with  transfers;Requires assistive device for independence      Cognition   Overall Cognitive Status  Within Functional Limits for tasks assessed      Tone   Assessment Location  Right Lower Extremity;Left Lower Extremity      Deep Tendon Reflexes   DTR Assessment Site  Achilles;Patella    Patella DTR  3+    Achilles DTR  4+;3+      ROM / Strength   AROM / PROM / Strength  PROM;Strength      PROM   Overall PROM Comments  RT /LT quads WNL    Right Hip Extension  10   prone   Right Hip Flexion  120    Right Hip External Rotation   60    Right Hip Internal Rotation   40    Right Hip ABduction  5    Right Hip ADduction  20    Left Hip Extension  10   prone   Left Hip Flexion  100    Left Hip External Rotation   45    Left Hip Internal Rotation   50    Left Hip ABduction  5    Left Hip ADduction  15      Strength   Overall Strength Comments  He is able to flex LE to to rise from supine.  He is able to genrate resistance with quads and hamstrings  Generally he is tone dominent with movmentin LE      Flexibility   Soft Tissue Assessment /Muscle Length  yes    Hamstrings  30 degrees passive bilaterall with increased extensor andadductor tone      Transfers   Transfers  Independent with all Transfers      Ambulation/Gait   Ambulation/Gait  Yes    Ambulation/Gait Assistance  6: Modified independent (Device/Increase time)   supervised today with belt but pt walks at home no assistanc   Ambulation Distance (Feet)  120 Feet    Assistive device  Rolling walker    Gait Pattern  Step-through pattern;Decreased step length - right;Decreased step length - left;Right foot flat;Left foot flat;Right flexed knee in stance;Left flexed knee in stance;Narrow base of support    Gait Comments  Overall flexed psoture with ait.       RLE Tone   RLE Tone  Severe;Hypertonic      RLE Tone   Hypertonic Details  extensor tone in supine wihth active and passive movment      LLE Tone   LLE Tone   Severe;Hypertonic  PT Long Term Goals - 07/04/19 1258      PT LONG TERM GOAL #1   Title  verbalizes ongoing progressive HEP including fitness program at fitness club.if he is a member    Time  5    Period  Weeks    Status  New      PT LONG TERM GOAL #2   Title  reports pain with gait /standing in hamstrings </= 2/10     Time  5    Period  Weeks    Status  New      PT LONG TERM GOAL #4   Title  ambulates 200' with RW safely modified independent. to demo decr pain and incr function    Time  5    Period  Weeks    Status  New      PT LONG TERM GOAL #5   Title  Gait Velocity >0.5 ft/sec     Time  5    Period  Weeks    Status  New            Plan - 07/04/19 1210    Clinical Impression Statement  Mr Friedrichsen presents with spasticity that causes pain and tension in LE with hamstring pain causing him to limit mobility and some times causes collapse of LE with walking. His walking is functional being able to wlk in his home  without assistance He does not walk in community due to time and effort and in waiting on a power WC. He has been here before at least twice and is always better after  though he had more walking distancew issues in the past.  A short bout of PT is appropriate but  a referral to  [facility] for possible botox injection to hamstrings may give him more sustained releif that PT    Personal Factors and Comorbidities  Comorbidity 2;Social Background;Transportation;Time since onset of injury/illness/exacerbation    Comorbidities  history of spinal cord injury in 1988 - fall from ladder causing C4-C6 resulting in spastic incomplete tetraplegia, cervical fusion, recurrent UTI, falls with L hip fracture requiring hip arthroplasty    Examination-Activity Limitations  Locomotion Level;Stand;Transfers;Dressing;Toileting;Bathing    Examination-Participation Restrictions  Community Activity;Meal Prep;Shop     Stability/Clinical Decision Making  Evolving/Moderate complexity    Clinical Decision Making  Moderate    Rehab Potential  Good    PT Frequency  2x / week    PT Duration  --   5 weeks   PT Treatment/Interventions  Passive range of motion;Manual techniques;Therapeutic exercise;Gait training;Moist Heat    PT Next Visit Plan  MAnual PROM bilateral LE, AROM as able, walking speed and distance    Consulted and Agree with Plan of Care  Patient       Patient will benefit from skilled therapeutic intervention in order to improve the following deficits and impairments:  Abnormal gait, Decreased balance, Decreased range of motion, Decreased strength, Difficulty walking, Increased muscle spasms, Impaired tone, Impaired UE functional use, Postural dysfunction, Pain  Visit Diagnosis: Quadriplegia, C5-C7 incomplete (HCC)  Other muscle spasm  Abnormal posture  Repeated falls  Difficulty in walking, not elsewhere classified     Problem List Patient Active Problem List   Diagnosis Date Noted  . Symptomatic anemia 05/13/2019  . DVT (deep venous thrombosis) ([place]) 10/19/2018  . Erectile dysfunction   . Spastic tetraplegia ([place]) 12/06/2013  . Normocytic anemia 11/05/2013  . History of recurrent UTIs 05/19/2010  . Functional quadriplegia ([place]) 05/10/2009  [name]  PT 07/04/2019, 1:18 PM  [facility] [address] [place], [place], [address] Phone: [phone]   Fax:  [phone]  Name: Andrew Sullivan MRN: [id number] Date of Birth: 1970/09/26

## 2019-07-11 ENCOUNTER — Other Ambulatory Visit: Payer: Self-pay

## 2019-07-11 ENCOUNTER — Ambulatory Visit: Payer: Medicare HMO | Attending: Family Medicine | Admitting: Physical Therapy

## 2019-07-11 ENCOUNTER — Encounter: Payer: Self-pay | Admitting: Physical Therapy

## 2019-07-11 DIAGNOSIS — R252 Cramp and spasm: Secondary | ICD-10-CM | POA: Insufficient documentation

## 2019-07-11 DIAGNOSIS — G8250 Quadriplegia, unspecified: Secondary | ICD-10-CM | POA: Diagnosis not present

## 2019-07-11 DIAGNOSIS — G8254 Quadriplegia, C5-C7 incomplete: Secondary | ICD-10-CM | POA: Insufficient documentation

## 2019-07-11 DIAGNOSIS — R2689 Other abnormalities of gait and mobility: Secondary | ICD-10-CM | POA: Diagnosis not present

## 2019-07-11 DIAGNOSIS — R293 Abnormal posture: Secondary | ICD-10-CM | POA: Diagnosis not present

## 2019-07-11 DIAGNOSIS — M62838 Other muscle spasm: Secondary | ICD-10-CM | POA: Diagnosis not present

## 2019-07-11 DIAGNOSIS — R262 Difficulty in walking, not elsewhere classified: Secondary | ICD-10-CM | POA: Insufficient documentation

## 2019-07-11 DIAGNOSIS — R296 Repeated falls: Secondary | ICD-10-CM | POA: Diagnosis not present

## 2019-07-11 NOTE — Therapy (Signed)
[place], [place], [address] Phone: [phone]   Fax:  [phone]  Physical Therapy Treatment  Patient Details  Name: Andrew Sullivan MRN: [id number] Date of Birth: 1970-08-06 Referring Provider (PT): Madison Hickman, MD   Encounter Date: 07/11/2019  PT End of Session - 07/11/19 1322    Visit Number  2    Number of Visits  10    Date for PT Re-Evaluation  08/05/19    Authorization Type  [employer] Medicare/Medicaid    PT Start Time  1250    PT Stop Time  1345    PT Time Calculation (min)  55 min       Past Medical History:  Diagnosis Date  . Erectile dysfunction   . Gross hematuria   . History of recurrent UTIs   . History of spinal cord injury    83 (age 43 fell from ladder) w/ cervical spine fx  s/p  c4 -- c6  fusion--  residual paraplegic bilateral legs (great below T8 uses w/c and can transfer self  . Paraparesis of both lower limbs ([place])    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 7 in 57  . Spastic tetraplegia ([place])     Past Surgical History:  Procedure Laterality Date  . CERVICAL FUSION  1988   C4 -- C6 w/ spinal cord injury  . CYSTOSCOPY WITH RETROGRADE PYELOGRAM, URETEROSCOPY AND STENT PLACEMENT Left 12/27/2013   Procedure: CYSTOSCOPY WITH RETROGRADE PYELOGRAM, URETEROSCOPY AND STENT PLACEMENT;  Surgeon: Reece Packer, MD;  Location: [place];  Service: Urology;  Laterality: Left;  . CYSTOSCOPY/RETROGRADE/URETEROSCOPY Left 01/09/2014   Procedure: CYSTOSCOPY/RETROGRADE/URETEROSCOPY, URETERAL BIOPSY AND STENT EXCHANGE;  Surgeon: Alexis Frock, MD;  [facility];  Service: Urology;  Laterality: Left;  . HIP ARTHROPLASTY  08/12/2011   Procedure: ARTHROPLASTY BIPOLAR HIP;  Surgeon: Mauri Pole, MD;  [facility];  Service: Orthopedics;  Laterality: Left;  Hemi-arthroplasty    There were no vitals filed for this visit.  Subjective Assessment - 07/11/19  1307    Subjective  Pain is a 4/10 today, not that bad. I cant get a good stretch since I am not going to the gym anymore.    Currently in Pain?  Yes    Pain Score  4     Pain Location  Leg    Pain Orientation  Posterior;Upper    Pain Descriptors / Indicators  Aching;Sore                       [facility] Adult PT Treatment/Exercise - 07/11/19 0001      Knee/Hip Exercises: Supine   Other Supine Knee/Hip Exercises  supine bilateral DF stretching with strap- pt pullong straps and PTA proviiding overpressure to extens knees.       Knee/Hip Exercises: Prone   Other Prone Exercises  Prone quad stretch - pt pulling strap over shoulder- 60 sec each - with assist from PTA to position strap      Modalities   Modalities  Moist Heat      Moist Heat Therapy   Number Minutes Moist Heat  15 Minutes    Moist Heat Location  --   posterior thigh and calves     Manual Therapy   Manual therapy comments  Passive hamstring stretching with opposite LE overpressure. Prone Passive calf stretching  PT Long Term Goals - 07/04/19 1258      PT LONG TERM GOAL #1   Title  verbalizes ongoing progressive HEP including fitness program at fitness club.if he is a member    Time  5    Period  Weeks    Status  New      PT LONG TERM GOAL #2   Title  reports pain with gait /standing in hamstrings </= 2/10     Time  5    Period  Weeks    Status  New      PT LONG TERM GOAL #4   Title  ambulates 200' with RW safely modified independent. to demo decr pain and incr function    Time  5    Period  Weeks    Status  New      PT LONG TERM GOAL #5   Title  Gait Velocity >0.5 ft/sec     Time  5    Period  Weeks    Status  New            Plan - 07/11/19 1340    Clinical Impression Statement  Session spent with Passive stretching and instructing pt how to use yoga straps to knee flexion and extension. Pt plans to purchase yoga straps at [place] today. Soft Foam roll  and small massage roller used to soften tension in hamstrings and calves while he was prone. HMP at end of session to further reduce tension.    Comorbidities  history of spinal cord injury in 1988 - fall from ladder causing C4-C6 resulting in spastic incomplete tetraplegia, cervical fusion, recurrent UTI, falls with L hip fracture requiring hip arthroplasty    PT Next Visit Plan  MAnual PROM bilateral LE, AROM as able, walking speed and distance, wants to try stair stepper?    PT Home Exercise Plan  purchase Yoga straps for self stretching       Patient will benefit from skilled therapeutic intervention in order to improve the following deficits and impairments:  Abnormal gait, Decreased balance, Decreased range of motion, Decreased strength, Difficulty walking, Increased muscle spasms, Impaired tone, Impaired UE functional use, Postural dysfunction, Pain  Visit Diagnosis: Quadriplegia, C5-C7 incomplete (HCC)  Other muscle spasm  Abnormal posture  Repeated falls  Difficulty in walking, not elsewhere classified  Cramp and spasm  Other abnormalities of gait and mobility  Chronic spastic tetraplegia [facility])     Problem List Patient Active Problem List   Diagnosis Date Noted  . Symptomatic anemia 05/13/2019  . DVT (deep venous thrombosis) ([place]) 10/19/2018  . Erectile dysfunction   . Spastic tetraplegia ([place]) 12/06/2013  . Normocytic anemia 11/05/2013  . History of recurrent UTIs 05/19/2010  . Functional quadriplegia ([facility]) 05/10/2009    Dorene Ar, PTA 07/11/2019, 1:43 PM  [place], [place], [address] Phone: [phone]   Fax:  [phone]  Name: Andrew Sullivan MRN: [id number] Date of Birth: 19-Mar-1970

## 2019-07-13 ENCOUNTER — Ambulatory Visit: Payer: Medicare HMO | Admitting: Physical Therapy

## 2019-07-15 ENCOUNTER — Other Ambulatory Visit: Payer: Self-pay

## 2019-07-15 ENCOUNTER — Encounter: Payer: Self-pay | Admitting: Physical Therapy

## 2019-07-15 ENCOUNTER — Ambulatory Visit: Payer: Medicare HMO | Admitting: Physical Therapy

## 2019-07-15 DIAGNOSIS — R293 Abnormal posture: Secondary | ICD-10-CM | POA: Diagnosis not present

## 2019-07-15 DIAGNOSIS — R262 Difficulty in walking, not elsewhere classified: Secondary | ICD-10-CM

## 2019-07-15 DIAGNOSIS — G8254 Quadriplegia, C5-C7 incomplete: Secondary | ICD-10-CM | POA: Diagnosis not present

## 2019-07-15 DIAGNOSIS — R2689 Other abnormalities of gait and mobility: Secondary | ICD-10-CM

## 2019-07-15 DIAGNOSIS — R296 Repeated falls: Secondary | ICD-10-CM | POA: Diagnosis not present

## 2019-07-15 DIAGNOSIS — R252 Cramp and spasm: Secondary | ICD-10-CM | POA: Diagnosis not present

## 2019-07-15 DIAGNOSIS — G8250 Quadriplegia, unspecified: Secondary | ICD-10-CM

## 2019-07-15 DIAGNOSIS — M62838 Other muscle spasm: Secondary | ICD-10-CM

## 2019-07-15 NOTE — Therapy (Signed)
[place], [place], [address] Phone: [phone]   Fax:  [phone]  Physical Therapy Treatment  Patient Details  Name: Andrew Sullivan MRN: [id number] Date of Birth: 02-17-1971 Referring Provider (PT): Madison Hickman, MD   Encounter Date: 07/15/2019  PT End of Session - 07/15/19 0738    Visit Number  3    Number of Visits  10    Date for PT Re-Evaluation  08/05/19    PT Start Time  0715    PT Stop Time  0808    PT Time Calculation (min)  53 min       Past Medical History:  Diagnosis Date  . Erectile dysfunction   . Gross hematuria   . History of recurrent UTIs   . History of spinal cord injury    8 (age 53 fell from ladder) w/ cervical spine fx  s/p  c4 -- c6  fusion--  residual paraplegic bilateral legs (great below T8 uses w/c and can transfer self  . Paraparesis of both lower limbs ([place])    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 54 in 41  . Spastic tetraplegia ([place])     Past Surgical History:  Procedure Laterality Date  . CERVICAL FUSION  1988   C4 -- C6 w/ spinal cord injury  . CYSTOSCOPY WITH RETROGRADE PYELOGRAM, URETEROSCOPY AND STENT PLACEMENT Left 12/27/2013   Procedure: CYSTOSCOPY WITH RETROGRADE PYELOGRAM, URETEROSCOPY AND STENT PLACEMENT;  Surgeon: Reece Packer, MD;  Location: [place];  Service: Urology;  Laterality: Left;  . CYSTOSCOPY/RETROGRADE/URETEROSCOPY Left 01/09/2014   Procedure: CYSTOSCOPY/RETROGRADE/URETEROSCOPY, URETERAL BIOPSY AND STENT EXCHANGE;  Surgeon: Alexis Frock, MD;  [facility];  Service: Urology;  Laterality: Left;  . HIP ARTHROPLASTY  08/12/2011   Procedure: ARTHROPLASTY BIPOLAR HIP;  Surgeon: Mauri Pole, MD;  [facility];  Service: Orthopedics;  Laterality: Left;  Hemi-arthroplasty    There were no vitals filed for this visit.  Subjective Assessment - 07/15/19 0737    Subjective  6/10 more tightness this  morning. I felt good for 2 days after last session. I was able to use crutches to walk.    Currently in Pain?  Yes    Pain Score  6     Pain Location  Leg    Pain Orientation  Posterior;Right;Left;Upper    Pain Descriptors / Indicators  Tightness    Pain Type  Chronic pain    Aggravating Factors   nothing specific    Pain Relieving Factors  stretching                       [facility] Adult PT Treatment/Exercise - 07/15/19 0001      Knee/Hip Exercises: Aerobic   Stepper  attempted stepper per pt request. He was too tight today, may try Nustep stepper next session      Knee/Hip Exercises: Supine   Other Supine Knee/Hip Exercises  supine bilateral DF stretching with strap- pt pullong straps and PTA proviiding overpressure to extend knees.       Knee/Hip Exercises: Prone   Other Prone Exercises  Prone quad stretch - pt pulling strap over shoulder- 60 sec each - with assist from PTA to position strap      Moist Heat Therapy   Number Minutes Moist Heat  15 Minutes    Moist Heat Location  --   posterior thigh and calves     Manual Therapy  Manual therapy comments  Passive hamstring stretching with opposite LE overpressure. Prone Passive calf stretching , bilater rolling into ER to decrease tone                  PT Long Term Goals - 07/04/19 1258      PT LONG TERM GOAL #1   Title  verbalizes ongoing progressive HEP including fitness program at fitness club.if he is a member    Time  5    Period  Weeks    Status  New      PT LONG TERM GOAL #2   Title  reports pain with gait /standing in hamstrings </= 2/10     Time  5    Period  Weeks    Status  New      PT LONG TERM GOAL #4   Title  ambulates 200' with RW safely modified independent. to demo decr pain and incr function    Time  5    Period  Weeks    Status  New      PT LONG TERM GOAL #5   Title  Gait Velocity >0.5 ft/sec     Time  5    Period  Weeks    Status  New            Plan -  07/15/19 0802    Clinical Impression Statement  Continue passive stretching and assisted stretching with yoga straps. He could not find a long enough strap for home. Continued prone foam roller and massage roller to decrease tension in posterior legs. Favorable response to last session.    PT Next Visit Plan  MAnual PROM bilateral LE, AROM as able, walking speed and distance, wants to try stair stepper? Try Nustep    PT Home Exercise Plan  purchase Yoga straps for self stretching       Patient will benefit from skilled therapeutic intervention in order to improve the following deficits and impairments:  Abnormal gait, Decreased balance, Decreased range of motion, Decreased strength, Difficulty walking, Increased muscle spasms, Impaired tone, Impaired UE functional use, Postural dysfunction, Pain  Visit Diagnosis: Quadriplegia, C5-C7 incomplete (HCC)  Other muscle spasm  Abnormal posture  Repeated falls  Difficulty in walking, not elsewhere classified  Cramp and spasm  Other abnormalities of gait and mobility  Chronic spastic tetraplegia ([place])     Problem List Patient Active Problem List   Diagnosis Date Noted  . Symptomatic anemia 05/13/2019  . DVT (deep venous thrombosis) ([place]) 10/19/2018  . Erectile dysfunction   . Spastic tetraplegia ([place]) 12/06/2013  . Normocytic anemia 11/05/2013  . History of recurrent UTIs 05/19/2010  . Functional quadriplegia ([facility]) 05/10/2009    Dorene Ar, PTA 07/15/2019, 8:06 AM  [place], [place], [address] Phone: [phone]   Fax:  [phone]  Name: Andrew Sullivan MRN: [id number] Date of Birth: 07/16/1970

## 2019-07-17 ENCOUNTER — Other Ambulatory Visit: Payer: Self-pay | Admitting: Family Medicine

## 2019-07-17 DIAGNOSIS — G8250 Quadriplegia, unspecified: Secondary | ICD-10-CM

## 2019-07-17 DIAGNOSIS — R532 Functional quadriplegia: Secondary | ICD-10-CM

## 2019-07-18 ENCOUNTER — Other Ambulatory Visit: Payer: Self-pay

## 2019-07-18 ENCOUNTER — Ambulatory Visit: Payer: Medicare HMO | Admitting: Physical Therapy

## 2019-07-18 ENCOUNTER — Encounter: Payer: Self-pay | Admitting: Physical Therapy

## 2019-07-18 DIAGNOSIS — R293 Abnormal posture: Secondary | ICD-10-CM

## 2019-07-18 DIAGNOSIS — R252 Cramp and spasm: Secondary | ICD-10-CM | POA: Diagnosis not present

## 2019-07-18 DIAGNOSIS — M62838 Other muscle spasm: Secondary | ICD-10-CM | POA: Diagnosis not present

## 2019-07-18 DIAGNOSIS — R262 Difficulty in walking, not elsewhere classified: Secondary | ICD-10-CM | POA: Diagnosis not present

## 2019-07-18 DIAGNOSIS — G8250 Quadriplegia, unspecified: Secondary | ICD-10-CM | POA: Diagnosis not present

## 2019-07-18 DIAGNOSIS — R296 Repeated falls: Secondary | ICD-10-CM | POA: Diagnosis not present

## 2019-07-18 DIAGNOSIS — G8254 Quadriplegia, C5-C7 incomplete: Secondary | ICD-10-CM

## 2019-07-18 DIAGNOSIS — R2689 Other abnormalities of gait and mobility: Secondary | ICD-10-CM

## 2019-07-18 NOTE — Therapy (Signed)
[place], [place], [address] Phone: [phone]   Fax:  [phone]  Physical Therapy Treatment  Patient Details  Name: Andrew Sullivan MRN: [id number] Date of Birth: Aug 22, 1970 Referring Provider (PT): Madison Hickman, MD   Encounter Date: 07/18/2019  PT End of Session - 07/18/19 1351    Visit Number  4    Number of Visits  10    Date for PT Re-Evaluation  08/05/19    Authorization Type  [employer] Medicare/Medicaid    PT Start Time  [id number]    PT Stop Time  1410    PT Time Calculation (min)  55 min       Past Medical History:  Diagnosis Date  . Erectile dysfunction   . Gross hematuria   . History of recurrent UTIs   . History of spinal cord injury    67 (age 61 fell from ladder) w/ cervical spine fx  s/p  c4 -- c6  fusion--  residual paraplegic bilateral legs (great below T8 uses w/c and can transfer self  . Paraparesis of both lower limbs ([place])    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 29 in 14  . Spastic tetraplegia ([place])     Past Surgical History:  Procedure Laterality Date  . CERVICAL FUSION  1988   C4 -- C6 w/ spinal cord injury  . CYSTOSCOPY WITH RETROGRADE PYELOGRAM, URETEROSCOPY AND STENT PLACEMENT Left 12/27/2013   Procedure: CYSTOSCOPY WITH RETROGRADE PYELOGRAM, URETEROSCOPY AND STENT PLACEMENT;  Surgeon: Reece Packer, MD;  Location: [facility];  Service: Urology;  Laterality: Left;  . CYSTOSCOPY/RETROGRADE/URETEROSCOPY Left 01/09/2014   Procedure: CYSTOSCOPY/RETROGRADE/URETEROSCOPY, URETERAL BIOPSY AND STENT EXCHANGE;  Surgeon: Alexis Frock, MD;  [facility];  Service: Urology;  Laterality: Left;  . HIP ARTHROPLASTY  08/12/2011   Procedure: ARTHROPLASTY BIPOLAR HIP;  Surgeon: Mauri Pole, MD;  [facility];  Service: Orthopedics;  Laterality: Left;  Hemi-arthroplasty    There were no vitals filed for this visit.  Subjective Assessment - 07/18/19  1350    Subjective  I always feel better after PT. Pain is less 2/10.    Currently in Pain?  Yes    Pain Score  2     Pain Location  Leg    Pain Orientation  Right;Left;Posterior;Upper    Pain Descriptors / Indicators  Tightness    Pain Type  Chronic pain                       [facility] Adult PT Treatment/Exercise - 07/18/19 0001      Knee/Hip Exercises: Aerobic   Nustep  6 minutes UE/LE L2 -pillow between knees       Knee/Hip Exercises: Supine   Other Supine Knee/Hip Exercises  supine bilateral DF stretching with strap- pt pullong straps and PTA proviiding overpressure to extend knees.       Moist Heat Therapy   Number Minutes Moist Heat  15 Minutes    Moist Heat Location  --   posterior thigh and calves     Manual Therapy   Manual therapy comments  Passive hamstring stretching with opposite LE overpressure. Prone Passive calf stretching , bilater rolling into ER to decrease tone                  PT Long Term Goals - 07/04/19 1258      PT LONG TERM GOAL #1   Title  verbalizes ongoing  progressive HEP including fitness program at fitness club.if he is a member    Time  5    Period  Weeks    Status  New      PT LONG TERM GOAL #2   Title  reports pain with gait /standing in hamstrings </= 2/10     Time  5    Period  Weeks    Status  New      PT LONG TERM GOAL #4   Title  ambulates 200' with RW safely modified independent. to demo decr pain and incr function    Time  5    Period  Weeks    Status  New      PT LONG TERM GOAL #5   Title  Gait Velocity >0.5 ft/sec     Time  5    Period  Weeks    Status  New            Plan - 07/18/19 1402    Clinical Impression Statement  Pt reports decreased pain in hamstrings today. He was able to use Nustep after manual treatment.    PT Next Visit Plan  MAnual PROM bilateral LE, AROM as able, walking speed and distance, Nustep, wants to use knee extension machine    PT Home Exercise Plan  purchase Yoga  straps for self stretching       Patient will benefit from skilled therapeutic intervention in order to improve the following deficits and impairments:  Abnormal gait, Decreased balance, Decreased range of motion, Decreased strength, Difficulty walking, Increased muscle spasms, Impaired tone, Impaired UE functional use, Postural dysfunction, Pain  Visit Diagnosis: Quadriplegia, C5-C7 incomplete (HCC)  Other muscle spasm  Abnormal posture  Repeated falls  Difficulty in walking, not elsewhere classified  Cramp and spasm  Other abnormalities of gait and mobility  Chronic spastic tetraplegia [facility])     Problem List Patient Active Problem List   Diagnosis Date Noted  . Symptomatic anemia 05/13/2019  . DVT (deep venous thrombosis) ([place]) 10/19/2018  . Erectile dysfunction   . Spastic tetraplegia ([place]) 12/06/2013  . Normocytic anemia 11/05/2013  . History of recurrent UTIs 05/19/2010  . Functional quadriplegia (HCC) 05/10/2009    Dorene Ar, PTA 07/18/2019, 2:03 PM  [facility] [address] [place], [place], [address] Phone: [phone]   Fax:  [phone]  Name: Andrew Sullivan MRN: [id number] Date of Birth: 1970-04-13

## 2019-07-20 ENCOUNTER — Ambulatory Visit: Payer: Medicare HMO | Admitting: Physical Therapy

## 2019-07-20 ENCOUNTER — Other Ambulatory Visit: Payer: Self-pay

## 2019-07-20 ENCOUNTER — Encounter: Payer: Self-pay | Admitting: Physical Therapy

## 2019-07-20 DIAGNOSIS — M62838 Other muscle spasm: Secondary | ICD-10-CM | POA: Diagnosis not present

## 2019-07-20 DIAGNOSIS — R262 Difficulty in walking, not elsewhere classified: Secondary | ICD-10-CM | POA: Diagnosis not present

## 2019-07-20 DIAGNOSIS — G8250 Quadriplegia, unspecified: Secondary | ICD-10-CM | POA: Diagnosis not present

## 2019-07-20 DIAGNOSIS — R296 Repeated falls: Secondary | ICD-10-CM

## 2019-07-20 DIAGNOSIS — G8254 Quadriplegia, C5-C7 incomplete: Secondary | ICD-10-CM | POA: Diagnosis not present

## 2019-07-20 DIAGNOSIS — R293 Abnormal posture: Secondary | ICD-10-CM | POA: Diagnosis not present

## 2019-07-20 DIAGNOSIS — R2689 Other abnormalities of gait and mobility: Secondary | ICD-10-CM | POA: Diagnosis not present

## 2019-07-20 DIAGNOSIS — R252 Cramp and spasm: Secondary | ICD-10-CM | POA: Diagnosis not present

## 2019-07-20 NOTE — Therapy (Signed)
[place], [place], [address] Phone: [phone]   Fax:  [phone]  Physical Therapy Treatment  Patient Details  Name: Andrew Sullivan MRN: [id number] Date of Birth: 25-Jul-1970 Referring Provider (PT): Madison Hickman, MD   Encounter Date: 07/20/2019  PT End of Session - 07/20/19 1352    Visit Number  5    Number of Visits  10    Date for PT Re-Evaluation  08/05/19    Authorization Type  [employer] Medicare/Medicaid    PT Start Time  [id number]    PT Stop Time  1415    PT Time Calculation (min)  60 min       Past Medical History:  Diagnosis Date  . Erectile dysfunction   . Gross hematuria   . History of recurrent UTIs   . History of spinal cord injury    87 (age 12 fell from ladder) w/ cervical spine fx  s/p  c4 -- c6  fusion--  residual paraplegic bilateral legs (great below T8 uses w/c and can transfer self  . Paraparesis of both lower limbs ([place])    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 5 in 75  . Spastic tetraplegia ([place])     Past Surgical History:  Procedure Laterality Date  . CERVICAL FUSION  1988   C4 -- C6 w/ spinal cord injury  . CYSTOSCOPY WITH RETROGRADE PYELOGRAM, URETEROSCOPY AND STENT PLACEMENT Left 12/27/2013   Procedure: CYSTOSCOPY WITH RETROGRADE PYELOGRAM, URETEROSCOPY AND STENT PLACEMENT;  Surgeon: Reece Packer, MD;  Location: [facility];  Service: Urology;  Laterality: Left;  . CYSTOSCOPY/RETROGRADE/URETEROSCOPY Left 01/09/2014   Procedure: CYSTOSCOPY/RETROGRADE/URETEROSCOPY, URETERAL BIOPSY AND STENT EXCHANGE;  Surgeon: Alexis Frock, MD;  [facility];  Service: Urology;  Laterality: Left;  . HIP ARTHROPLASTY  08/12/2011   Procedure: ARTHROPLASTY BIPOLAR HIP;  Surgeon: Mauri Pole, MD;  [facility];  Service: Orthopedics;  Laterality: Left;  Hemi-arthroplasty    There were no vitals filed for this visit.  Subjective Assessment - 07/20/19  1352    Subjective  No pain today, still some tightness    Currently in Pain?  No/denies                        [facility] Adult PT Treatment/Exercise - 07/20/19 0001      Knee/Hip Exercises: Aerobic   Nustep  5 minutes UE/LE L2 -pillow between knees       Knee/Hip Exercises: Machines for Strengthening   Cybex Knee Extension  Knee extension 5# bilateral 3 x 10 to tolerance       Knee/Hip Exercises: Supine   Other Supine Knee/Hip Exercises  supine bilateral DF stretching with strap- pt pullong straps and PTA proviiding overpressure to extend knees.       Moist Heat Therapy   Number Minutes Moist Heat  15 Minutes    Moist Heat Location  --   posterior thigh and calves     Manual Therapy   Manual therapy comments  Passive hamstring stretching with opposite LE overpressure. Prone Passive calf stretching , bilater rolling into ER to decrease tone                  PT Long Term Goals - 07/04/19 1258      PT LONG TERM GOAL #1   Title  verbalizes ongoing progressive HEP including fitness program at fitness club.if he is a member    Time  5    Period  Weeks    Status  New      PT LONG TERM GOAL #2   Title  reports pain with gait /standing in hamstrings </= 2/10     Time  5    Period  Weeks    Status  New      PT LONG TERM GOAL #4   Title  ambulates 200' with RW safely modified independent. to demo decr pain and incr function    Time  5    Period  Weeks    Status  New      PT LONG TERM GOAL #5   Title  Gait Velocity >0.5 ft/sec     Time  5    Period  Weeks    Status  New            Plan - 07/20/19 1402    Clinical Impression Statement  Pt reports no pain today. Continued with PROM and STW prior to gym machines. He was able to use knee extension machine. Afterward, able to stand with erect posture.    Comorbidities  history of spinal cord injury in 1988 - fall from ladder causing C4-C6 resulting in spastic incomplete tetraplegia, cervical  fusion, recurrent UTI, falls with L hip fracture requiring hip arthroplasty    PT Next Visit Plan  MAnual PROM bilateral LE, AROM as able, walking speed and distance, Nustep, wants to use knee extension machine    PT Home Exercise Plan  purchase Yoga straps for self stretching       Patient will benefit from skilled therapeutic intervention in order to improve the following deficits and impairments:  Abnormal gait, Decreased balance, Decreased range of motion, Decreased strength, Difficulty walking, Increased muscle spasms, Impaired tone, Impaired UE functional use, Postural dysfunction, Pain  Visit Diagnosis: Quadriplegia, C5-C7 incomplete (HCC)  Other muscle spasm  Abnormal posture  Repeated falls  Difficulty in walking, not elsewhere classified  Cramp and spasm     Problem List Patient Active Problem List   Diagnosis Date Noted  . Symptomatic anemia 05/13/2019  . DVT (deep venous thrombosis) ([place]) 10/19/2018  . Erectile dysfunction   . Spastic tetraplegia ([place]) 12/06/2013  . Normocytic anemia 11/05/2013  . History of recurrent UTIs 05/19/2010  . Functional quadriplegia (HCC) 05/10/2009    Dorene Ar , PTA 07/20/2019, 2:03 PM  [place], [place], [address] Phone: [phone]   Fax:  [phone]  Name: Andrew Sullivan MRN: [id number] Date of Birth: 1970-07-23

## 2019-07-25 ENCOUNTER — Other Ambulatory Visit: Payer: Self-pay

## 2019-07-25 ENCOUNTER — Ambulatory Visit: Payer: Medicare HMO | Admitting: Physical Therapy

## 2019-07-25 DIAGNOSIS — R2689 Other abnormalities of gait and mobility: Secondary | ICD-10-CM | POA: Diagnosis not present

## 2019-07-25 DIAGNOSIS — M62838 Other muscle spasm: Secondary | ICD-10-CM

## 2019-07-25 DIAGNOSIS — R293 Abnormal posture: Secondary | ICD-10-CM

## 2019-07-25 DIAGNOSIS — G8250 Quadriplegia, unspecified: Secondary | ICD-10-CM | POA: Diagnosis not present

## 2019-07-25 DIAGNOSIS — R262 Difficulty in walking, not elsewhere classified: Secondary | ICD-10-CM | POA: Diagnosis not present

## 2019-07-25 DIAGNOSIS — G8254 Quadriplegia, C5-C7 incomplete: Secondary | ICD-10-CM | POA: Diagnosis not present

## 2019-07-25 DIAGNOSIS — R252 Cramp and spasm: Secondary | ICD-10-CM | POA: Diagnosis not present

## 2019-07-25 DIAGNOSIS — R296 Repeated falls: Secondary | ICD-10-CM | POA: Diagnosis not present

## 2019-07-25 NOTE — Therapy (Signed)
[place], [place], [address] Phone: [phone]   Fax:  [phone]  Physical Therapy Treatment  Patient Details  Name: Andrew Sullivan MRN: [id number] Date of Birth: Jun 27, 1970 Referring Provider (PT): Madison Hickman, MD   Encounter Date: 07/25/2019  PT End of Session - 07/25/19 1308    Visit Number  6    Number of Visits  10    Date for PT Re-Evaluation  08/05/19    Authorization Type  [employer] Medicare/Medicaid    PT Start Time  [id number]    PT Stop Time  1405    PT Time Calculation (min)  50 min       Past Medical History:  Diagnosis Date  . Erectile dysfunction   . Gross hematuria   . History of recurrent UTIs   . History of spinal cord injury    49 (age 35 fell from ladder) w/ cervical spine fx  s/p  c4 -- c6  fusion--  residual paraplegic bilateral legs (great below T8 uses w/c and can transfer self  . Paraparesis of both lower limbs ([place])    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 49 in 67  . Spastic tetraplegia ([place])     Past Surgical History:  Procedure Laterality Date  . CERVICAL FUSION  1988   C4 -- C6 w/ spinal cord injury  . CYSTOSCOPY WITH RETROGRADE PYELOGRAM, URETEROSCOPY AND STENT PLACEMENT Left 12/27/2013   Procedure: CYSTOSCOPY WITH RETROGRADE PYELOGRAM, URETEROSCOPY AND STENT PLACEMENT;  Surgeon: Reece Packer, MD;  Location: [facility];  Service: Urology;  Laterality: Left;  . CYSTOSCOPY/RETROGRADE/URETEROSCOPY Left 01/09/2014   Procedure: CYSTOSCOPY/RETROGRADE/URETEROSCOPY, URETERAL BIOPSY AND STENT EXCHANGE;  Surgeon: Alexis Frock, MD;  [facility];  Service: Urology;  Laterality: Left;  . HIP ARTHROPLASTY  08/12/2011   Procedure: ARTHROPLASTY BIPOLAR HIP;  Surgeon: Mauri Pole, MD;  [facility];  Service: Orthopedics;  Laterality: Left;  Hemi-arthroplasty    There were no vitals filed for this visit.  Subjective Assessment - 07/25/19  1314    Subjective  3/10 today in the back of the legs. I can stand about 30-45 seconds and then my legs get tight and I have to sit down.    Pertinent History  history of spinal cord injury in 1988 - fall from ladder causing C4-C6 resulting in spastic incomplete tetraplegia, cervical fusion, recurrent UTI, falls with L hip fracture requiring hip arthroplasty    Currently in Pain?  Yes    Pain Score  3     Pain Location  Leg    Pain Orientation  Right;Left;Upper;Posterior    Pain Descriptors / Indicators  Tightness    Pain Type  Chronic pain    Aggravating Factors   standing    Pain Relieving Factors  stretching                        [facility] Adult PT Treatment/Exercise - 07/25/19 0001      Knee/Hip Exercises: Aerobic   Nustep  6 minutes UE/LE L2 -pillow between knees       Knee/Hip Exercises: Machines for Strengthening   Cybex Knee Extension  Knee extension 5# bilateral 3 x 10 to tolerance       Knee/Hip Exercises: Supine   Other Supine Knee/Hip Exercises  supine bilateral DF stretching with strap- pt pullong straps and PTA proviiding overpressure to extend knees.       Knee/Hip Exercises:  Prone   Other Prone Exercises   Prone hip flexor PROM bilateral       Moist Heat Therapy   Number Minutes Moist Heat  --   scat arrived to early for heat today     Manual Therapy   Manual therapy comments  Passive hamstring stretching with opposite LE overpressure. Prone Passive calf stretching , intermittent  bilater rolling into ER to decrease tone, passive hip flexor stretching in prone                  PT Long Term Goals - 07/04/19 1258      PT LONG TERM GOAL #1   Title  verbalizes ongoing progressive HEP including fitness program at fitness club.if he is a member    Time  5    Period  Weeks    Status  New      PT LONG TERM GOAL #2   Title  reports pain with gait /standing in hamstrings </= 2/10     Time  5    Period  Weeks    Status  New      PT LONG  TERM GOAL #4   Title  ambulates 200' with RW safely modified independent. to demo decr pain and incr function    Time  5    Period  Weeks    Status  New      PT LONG TERM GOAL #5   Title  Gait Velocity >0.5 ft/sec     Time  5    Period  Weeks    Status  New            Plan - 07/25/19 1401    Clinical Impression Statement  Pt reports he is still limited with standing tolerance. Continued with PROM and stretching of hip flexors, hamstring and calves. He Used gym machines for knee extension therex,. HMP at end of session to decrease posterior leg tension.    Personal Factors and Comorbidities  Comorbidity 2;Social Background;Transportation;Time since onset of injury/illness/exacerbation    Comorbidities  history of spinal cord injury in 1988 - fall from ladder causing C4-C6 resulting in spastic incomplete tetraplegia, cervical fusion, recurrent UTI, falls with L hip fracture requiring hip arthroplasty    PT Next Visit Plan  MAnual PROM bilateral LE, AROM as able, walking speed and distance, Nustep,knee extension    PT Home Exercise Plan  purchase Yoga straps for self stretching       Patient will benefit from skilled therapeutic intervention in order to improve the following deficits and impairments:  Abnormal gait, Decreased balance, Decreased range of motion, Decreased strength, Difficulty walking, Increased muscle spasms, Impaired tone, Impaired UE functional use, Postural dysfunction, Pain  Visit Diagnosis: Other muscle spasm  Quadriplegia, C5-C7 incomplete (HCC)  Abnormal posture     Problem List Patient Active Problem List   Diagnosis Date Noted  . Symptomatic anemia 05/13/2019  . DVT (deep venous thrombosis) ([place]) 10/19/2018  . Erectile dysfunction   . Spastic tetraplegia ([place]) 12/06/2013  . Normocytic anemia 11/05/2013  . History of recurrent UTIs 05/19/2010  . Functional quadriplegia (HCC) 05/10/2009    Dorene Ar, PTA 07/25/2019, 2:15 PM  [place], [place], [address] Phone: [phone]   Fax:  [phone]  Name: STIHL BRUSSO MRN: [id number] Date of Birth: [birth year]

## 2019-07-27 ENCOUNTER — Other Ambulatory Visit: Payer: Self-pay

## 2019-07-27 ENCOUNTER — Encounter: Payer: Self-pay | Admitting: Physical Therapy

## 2019-07-27 ENCOUNTER — Ambulatory Visit: Payer: Medicare HMO | Admitting: Physical Therapy

## 2019-07-27 DIAGNOSIS — M62838 Other muscle spasm: Secondary | ICD-10-CM

## 2019-07-27 DIAGNOSIS — R293 Abnormal posture: Secondary | ICD-10-CM | POA: Diagnosis not present

## 2019-07-27 DIAGNOSIS — R2689 Other abnormalities of gait and mobility: Secondary | ICD-10-CM | POA: Diagnosis not present

## 2019-07-27 DIAGNOSIS — G8254 Quadriplegia, C5-C7 incomplete: Secondary | ICD-10-CM

## 2019-07-27 DIAGNOSIS — R296 Repeated falls: Secondary | ICD-10-CM | POA: Diagnosis not present

## 2019-07-27 DIAGNOSIS — G8250 Quadriplegia, unspecified: Secondary | ICD-10-CM | POA: Diagnosis not present

## 2019-07-27 DIAGNOSIS — R262 Difficulty in walking, not elsewhere classified: Secondary | ICD-10-CM | POA: Diagnosis not present

## 2019-07-27 DIAGNOSIS — R252 Cramp and spasm: Secondary | ICD-10-CM | POA: Diagnosis not present

## 2019-07-27 NOTE — Therapy (Signed)
[place], [place], [address] Phone: [phone]   Fax:  [phone]  Physical Therapy Treatment  Patient Details  Name: Andrew Sullivan MRN: [id number] Date of Birth: [birth year] Referring Provider (PT): Madison Hickman, MD   Encounter Date: 07/27/2019  PT End of Session - 07/27/19 1316    Visit Number  7    Number of Visits  10    Date for PT Re-Evaluation  08/05/19    Authorization Type  [employer] Medicare/Medicaid    PT Start Time  [id number]    PT Stop Time  1420    PT Time Calculation (min)  65 min       Past Medical History:  Diagnosis Date  . Erectile dysfunction   . Gross hematuria   . History of recurrent UTIs   . History of spinal cord injury    72 (age 43 fell from ladder) w/ cervical spine fx  s/p  c4 -- c6  fusion--  residual paraplegic bilateral legs (great below T8 uses w/c and can transfer self  . Paraparesis of both lower limbs ([place])    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 54 in 72  . Spastic tetraplegia ([place])     Past Surgical History:  Procedure Laterality Date  . CERVICAL FUSION  1988   C4 -- C6 w/ spinal cord injury  . CYSTOSCOPY WITH RETROGRADE PYELOGRAM, URETEROSCOPY AND STENT PLACEMENT Left 12/27/2013   Procedure: CYSTOSCOPY WITH RETROGRADE PYELOGRAM, URETEROSCOPY AND STENT PLACEMENT;  Surgeon: Reece Packer, MD;  Location: [facility];  Service: Urology;  Laterality: Left;  . CYSTOSCOPY/RETROGRADE/URETEROSCOPY Left 01/09/2014   Procedure: CYSTOSCOPY/RETROGRADE/URETEROSCOPY, URETERAL BIOPSY AND STENT EXCHANGE;  Surgeon: Alexis Frock, MD;  [facility];  Service: Urology;  Laterality: Left;  . HIP ARTHROPLASTY  08/12/2011   Procedure: ARTHROPLASTY BIPOLAR HIP;  Surgeon: Mauri Pole, MD;  [facility];  Service: Orthopedics;  Laterality: Left;  Hemi-arthroplasty    There were no vitals filed for this visit.  Subjective Assessment - 07/27/19  1316    Subjective  No pain today. Doing good. I walk everyday.    Currently in Pain?  No/denies                        [facility] Adult PT Treatment/Exercise - 07/27/19 0001      Ambulation/Gait   Ambulation/Gait  Yes    Ambulation/Gait Assistance  6: Modified independent (Device/Increase time)    Ambulation Distance (Feet)  115 Feet      Knee/Hip Exercises: Aerobic   Nustep  6 minutes UE/LE L2 -pillow between knees       Knee/Hip Exercises: Machines for Strengthening   Cybex Knee Extension  Knee extension 5# bilateral 3 x 15x 3  to tolerance       Knee/Hip Exercises: Supine   Other Supine Knee/Hip Exercises  supine bilateral DF stretching with strap- pt pullong straps and PTA proviiding overpressure to extend knees.       Knee/Hip Exercises: Prone   Other Prone Exercises   Prone hip flexor PROM bilateral       Moist Heat Therapy   Number Minutes Moist Heat  15 Minutes    Moist Heat Location  --   posterior thigh and calves     Manual Therapy   Manual therapy comments  Passive hamstring stretching with opposite LE overpressure. Prone Passive calf stretching , intermittent  bilater rolling into ER to  decrease tone, passive hip flexor stretching in prone                  PT Long Term Goals - 07/04/19 1258      PT LONG TERM GOAL #1   Title  verbalizes ongoing progressive HEP including fitness program at fitness club.if he is a member    Time  5    Period  Weeks    Status  New      PT LONG TERM GOAL #2   Title  reports pain with gait /standing in hamstrings </= 2/10     Time  5    Period  Weeks    Status  New      PT LONG TERM GOAL #4   Title  ambulates 200' with RW safely modified independent. to demo decr pain and incr function    Time  5    Period  Weeks    Status  New      PT LONG TERM GOAL #5   Title  Gait Velocity >0.5 ft/sec     Time  5    Period  Weeks    Status  New            Plan - 07/27/19 1415    Clinical Impression  Statement  Pt reports feeling no pai today. Continued with PROM, knee extension  machines. Gait 146ft with RW and he reported increased ease with gait following session.    PT Next Visit Plan  ERO Vs DC NEXT WEEK; MAnual PROM bilateral LE, AROM as able, walking speed and distance, Nustep,knee extension    PT Home Exercise Plan  purchase Yoga straps for self stretching       Patient will benefit from skilled therapeutic intervention in order to improve the following deficits and impairments:  Abnormal gait, Decreased balance, Decreased range of motion, Decreased strength, Difficulty walking, Increased muscle spasms, Impaired tone, Impaired UE functional use, Postural dysfunction, Pain  Visit Diagnosis: Other muscle spasm  Quadriplegia, C5-C7 incomplete (HCC)  Abnormal posture     Problem List Patient Active Problem List   Diagnosis Date Noted  . Symptomatic anemia 05/13/2019  . DVT (deep venous thrombosis) ([place]) 10/19/2018  . Erectile dysfunction   . Spastic tetraplegia ([place]) 12/06/2013  . Normocytic anemia 11/05/2013  . History of recurrent UTIs 05/19/2010  . Functional quadriplegia (HCC) 05/10/2009    Dorene Ar, PTA 07/27/2019, 2:17 PM  [facility] [address] [place], [place], [address] Phone: [phone]   Fax:  [phone]  Name: JHAMIR MCGILTON MRN: [id number] Date of Birth: [birth year]

## 2019-08-01 ENCOUNTER — Other Ambulatory Visit: Payer: Self-pay

## 2019-08-01 ENCOUNTER — Ambulatory Visit: Payer: Medicare HMO

## 2019-08-01 DIAGNOSIS — G8250 Quadriplegia, unspecified: Secondary | ICD-10-CM | POA: Diagnosis not present

## 2019-08-01 DIAGNOSIS — R293 Abnormal posture: Secondary | ICD-10-CM | POA: Diagnosis not present

## 2019-08-01 DIAGNOSIS — R2689 Other abnormalities of gait and mobility: Secondary | ICD-10-CM | POA: Diagnosis not present

## 2019-08-01 DIAGNOSIS — M62838 Other muscle spasm: Secondary | ICD-10-CM

## 2019-08-01 DIAGNOSIS — G8254 Quadriplegia, C5-C7 incomplete: Secondary | ICD-10-CM

## 2019-08-01 DIAGNOSIS — R296 Repeated falls: Secondary | ICD-10-CM | POA: Diagnosis not present

## 2019-08-01 DIAGNOSIS — R252 Cramp and spasm: Secondary | ICD-10-CM

## 2019-08-01 DIAGNOSIS — R262 Difficulty in walking, not elsewhere classified: Secondary | ICD-10-CM | POA: Diagnosis not present

## 2019-08-01 NOTE — Therapy (Addendum)
[place], [place], [address] Phone: [phone]   Fax:  [phone]  Physical Therapy Treatment  Patient Details  Name: Andrew Sullivan MRN: [id number] Date of Birth: 06-16-1970 Referring Provider (PT): Madison Hickman, MD   Encounter Date: 08/01/2019  PT End of Session - 08/01/19 1450    Visit Number  8    Number of Visits  11    Date for PT Re-Evaluation  08/26/19    Authorization Type  [employer] Medicare/Medicaid    PT Start Time  0200    PT Stop Time  0300    PT Time Calculation (min)  60 min    Activity Tolerance  Patient tolerated treatment well    Behavior During Therapy  [facility] for tasks assessed/performed       Past Medical History:  Diagnosis Date  . Erectile dysfunction   . Gross hematuria   . History of recurrent UTIs   . History of spinal cord injury    11 (age 22 fell from ladder) w/ cervical spine fx  s/p  c4 -- c6  fusion--  residual paraplegic bilateral legs (great below T8 uses w/c and can transfer self  . Paraparesis of both lower limbs ([place])    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 57 in 44  . Spastic tetraplegia ([place])     Past Surgical History:  Procedure Laterality Date  . CERVICAL FUSION  1988   C4 -- C6 w/ spinal cord injury  . CYSTOSCOPY WITH RETROGRADE PYELOGRAM, URETEROSCOPY AND STENT PLACEMENT Left 12/27/2013   Procedure: CYSTOSCOPY WITH RETROGRADE PYELOGRAM, URETEROSCOPY AND STENT PLACEMENT;  Surgeon: Reece Packer, MD;  Location: [place];  Service: Urology;  Laterality: Left;  . CYSTOSCOPY/RETROGRADE/URETEROSCOPY Left 01/09/2014   Procedure: CYSTOSCOPY/RETROGRADE/URETEROSCOPY, URETERAL BIOPSY AND STENT EXCHANGE;  Surgeon: Alexis Frock, MD;  [facility];  Service: Urology;  Laterality: Left;  . HIP ARTHROPLASTY  08/12/2011   Procedure: ARTHROPLASTY BIPOLAR HIP;  Surgeon: Mauri Pole, MD;  [facility];  Service: Orthopedics;   Laterality: Left;  Hemi-arthroplasty    There were no vitals filed for this visit.  Subjective Assessment - 08/01/19 1359    Subjective  No pain today. Doing good.                        [place] Adult PT Treatment/Exercise - 08/01/19 0001      Ambulation/Gait   Ambulation/Gait Assistance  6: Modified independent (Device/Increase time)    Ambulation Distance (Feet)  250 Feet    Assistive device  Rolling walker    Gait Pattern  Step-through pattern;Decreased step length - right;Decreased step length - left;Right foot flat;Left foot flat;Right flexed knee in stance;Left flexed knee in stance;Narrow base of support    Ambulation Surface  Level;Indoor      Knee/Hip Exercises: Aerobic   Nustep  6 minutes UE/LE L8 -soft roller held by PT between knees       Knee/Hip Exercises: Machines for Strengthening   Cybex Knee Extension  Knee extension 5# bilateral 3 x 15x 3  to tolerance       Knee/Hip Exercises: Seated   Other Seated Knee/Hip Exercises  hamstring stretch RT/Lt 60 sec      Knee/Hip Exercises: Prone   Other Prone Exercises   Prone hip flexor PROM bilateral       Moist Heat Therapy   Number Minutes Moist Heat  15 Minutes  Moist Heat Location  --   thigh/ calves     Manual Therapy   Manual therapy comments  Passive hamstring stretching with opposite LE overpressure. Prone Passive calf stretching , intermittent  bilater rolling into ER to decrease tone, passive hip flexor stretching in prone                  PT Long Term Goals - 08/01/19 1449      PT LONG TERM GOAL #1   Title  verbalizes ongoing progressive HEP including fitness program at fitness club.if he is a member      PT LONG TERM GOAL #2   Title  reports pain with gait /standing in hamstrings </= 2/10     Status  Achieved      PT LONG TERM GOAL #4   Title  ambulates 200' with RW safely modified independent. to demo decr pain and incr function    Baseline  250 today plus walked to all  equipment and back to mat    Status  Achieved            Plan - 08/01/19 1451    Clinical Impression Statement  He has met almost all goals . He has maxed benefit except he  may benefit form 2 more weeks fo stretching and actiivity woul give longer term benefits  and may not return quickly.    PT Treatment/Interventions  Passive range of motion;Manual techniques;Therapeutic exercise;Gait training;Moist Heat    PT Next Visit Plan  continue 1x/week for 2 weeks stretching , gait  machines    PT Home Exercise Plan  purchase Yoga straps for self stretching    Consulted and Agree with Plan of Care  Patient       Patient will benefit from skilled therapeutic intervention in order to improve the following deficits and impairments:  Abnormal gait, Decreased balance, Decreased range of motion, Decreased strength, Difficulty walking, Increased muscle spasms, Impaired tone, Impaired UE functional use, Postural dysfunction, Pain  Visit Diagnosis: Other muscle spasm  Quadriplegia, C5-C7 incomplete (HCC)  Abnormal posture  Cramp and spasm  Chronic spastic tetraplegia (HCC)     Problem List Patient Active Problem List   Diagnosis Date Noted  . Symptomatic anemia 05/13/2019  . DVT (deep venous thrombosis) ([place]) 10/19/2018  . Erectile dysfunction   . Spastic tetraplegia ([place]) 12/06/2013  . Normocytic anemia 11/05/2013  . History of recurrent UTIs 05/19/2010  . Functional quadriplegia ([place]) 05/10/2009    Andrew Sullivan  PT 08/01/2019, 2:58 PM  [facility] [address] [place], [place], [address] Phone: [phone]   Fax:  [phone]  Name: Andrew Sullivan MRN: [id number] Date of Birth: 1970/09/16

## 2019-08-03 ENCOUNTER — Ambulatory Visit: Payer: Medicare HMO | Admitting: Physical Therapy

## 2019-08-03 ENCOUNTER — Other Ambulatory Visit: Payer: Self-pay

## 2019-08-03 ENCOUNTER — Encounter: Payer: Self-pay | Admitting: Physical Therapy

## 2019-08-03 DIAGNOSIS — R2689 Other abnormalities of gait and mobility: Secondary | ICD-10-CM | POA: Diagnosis not present

## 2019-08-03 DIAGNOSIS — R293 Abnormal posture: Secondary | ICD-10-CM | POA: Diagnosis not present

## 2019-08-03 DIAGNOSIS — G8254 Quadriplegia, C5-C7 incomplete: Secondary | ICD-10-CM | POA: Diagnosis not present

## 2019-08-03 DIAGNOSIS — G8250 Quadriplegia, unspecified: Secondary | ICD-10-CM | POA: Diagnosis not present

## 2019-08-03 DIAGNOSIS — M62838 Other muscle spasm: Secondary | ICD-10-CM

## 2019-08-03 DIAGNOSIS — R252 Cramp and spasm: Secondary | ICD-10-CM | POA: Diagnosis not present

## 2019-08-03 DIAGNOSIS — R296 Repeated falls: Secondary | ICD-10-CM | POA: Diagnosis not present

## 2019-08-03 DIAGNOSIS — R262 Difficulty in walking, not elsewhere classified: Secondary | ICD-10-CM

## 2019-08-03 NOTE — Therapy (Signed)
[place], [place], [address] Phone: [phone]   Fax:  [phone]  Physical Therapy Treatment  Patient Details  Name: Andrew Sullivan MRN: [id number] Date of Birth: 1970-05-16 Referring Provider (PT): Madison Hickman, MD   Encounter Date: 08/03/2019  PT End of Session - 08/03/19 1448    Visit Number  9    Number of Visits  11    Date for PT Re-Evaluation  08/26/19    Authorization Type  [employer] Medicare/Medicaid    PT Start Time  1448    PT Stop Time  1530    PT Time Calculation (min)  42 min    Activity Tolerance  Patient tolerated treatment well    Behavior During Therapy  [facility] for tasks assessed/performed       Past Medical History:  Diagnosis Date  . Erectile dysfunction   . Gross hematuria   . History of recurrent UTIs   . History of spinal cord injury    30 (age 64 fell from ladder) w/ cervical spine fx  s/p  c4 -- c6  fusion--  residual paraplegic bilateral legs (great below T8 uses w/c and can transfer self  . Paraparesis of both lower limbs ([place])    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 64 in 88  . Spastic tetraplegia ([place])     Past Surgical History:  Procedure Laterality Date  . CERVICAL FUSION  1988   C4 -- C6 w/ spinal cord injury  . CYSTOSCOPY WITH RETROGRADE PYELOGRAM, URETEROSCOPY AND STENT PLACEMENT Left 12/27/2013   Procedure: CYSTOSCOPY WITH RETROGRADE PYELOGRAM, URETEROSCOPY AND STENT PLACEMENT;  Surgeon: Reece Packer, MD;  Location: [place];  Service: Urology;  Laterality: Left;  . CYSTOSCOPY/RETROGRADE/URETEROSCOPY Left 01/09/2014   Procedure: CYSTOSCOPY/RETROGRADE/URETEROSCOPY, URETERAL BIOPSY AND STENT EXCHANGE;  Surgeon: Alexis Frock, MD;  [facility];  Service: Urology;  Laterality: Left;  . HIP ARTHROPLASTY  08/12/2011   Procedure: ARTHROPLASTY BIPOLAR HIP;  Surgeon: Mauri Pole, MD;  [facility];  Service: Orthopedics;   Laterality: Left;  Hemi-arthroplasty    There were no vitals filed for this visit.                     [place] Adult PT Treatment/Exercise - 08/03/19 0001      Knee/Hip Exercises: Aerobic   Nustep  6 minutes UE/LE L2 -soft roller held by PT between knees       Manual Therapy   Manual Therapy  Passive ROM    Manual therapy comments  Passive hamstring stretching with opposite LE overpressure. Prone Passive calf stretching , intermittent  bilater rolling into ER to decrease tone, passive hip flexor stretching in prone                  PT Long Term Goals - 08/01/19 1449      PT LONG TERM GOAL #1   Title  verbalizes ongoing progressive HEP including fitness program at fitness club.if he is a member      PT LONG TERM GOAL #2   Title  reports pain with gait /standing in hamstrings </= 2/10     Status  Achieved      PT LONG TERM GOAL #4   Title  ambulates 200' with RW safely modified independent. to demo decr pain and incr function    Baseline  250 today plus walked to all equipment and back to mat    Status  Achieved            Plan - 08/03/19 1625    Clinical Impression Statement  Pt did well with PROM and stretching of his hips and ankles.  had some increased tone in Lt ankle    Rehab Potential  Good    PT Frequency  2x / week    PT Treatment/Interventions  Passive range of motion;Manual techniques;Therapeutic exercise;Gait training;Moist Heat    PT Next Visit Plan  cont for 2 wks    Consulted and Agree with Plan of Care  Patient       Patient will benefit from skilled therapeutic intervention in order to improve the following deficits and impairments:  Abnormal gait, Decreased balance, Decreased range of motion, Decreased strength, Difficulty walking, Increased muscle spasms, Impaired tone, Impaired UE functional use, Postural dysfunction, Pain  Visit Diagnosis: Other muscle spasm  Quadriplegia, C5-C7 incomplete (HCC)  Abnormal  posture  Cramp and spasm  Chronic spastic tetraplegia (HCC)  Repeated falls  Difficulty in walking, not elsewhere classified  Other abnormalities of gait and mobility     Problem List Patient Active Problem List   Diagnosis Date Noted  . Symptomatic anemia 05/13/2019  . DVT (deep venous thrombosis) ([place]) 10/19/2018  . Erectile dysfunction   . Spastic tetraplegia ([place]) 12/06/2013  . Normocytic anemia 11/05/2013  . History of recurrent UTIs 05/19/2010  . Functional quadriplegia ([place]) 05/10/2009    Jeral Pinch PT  08/03/2019, 4:28 PM  [facility] [address] [place], [place], [address] Phone: [phone]   Fax:  [phone]  Name: Andrew Sullivan MRN: [id number] Date of Birth: [birth year]

## 2019-08-04 DIAGNOSIS — R609 Edema, unspecified: Secondary | ICD-10-CM | POA: Diagnosis not present

## 2019-08-04 DIAGNOSIS — R532 Functional quadriplegia: Secondary | ICD-10-CM | POA: Diagnosis not present

## 2019-08-04 DIAGNOSIS — G8250 Quadriplegia, unspecified: Secondary | ICD-10-CM | POA: Diagnosis not present

## 2019-08-04 DIAGNOSIS — M62830 Muscle spasm of back: Secondary | ICD-10-CM | POA: Diagnosis not present

## 2019-08-15 ENCOUNTER — Other Ambulatory Visit: Payer: Self-pay

## 2019-08-15 ENCOUNTER — Ambulatory Visit: Payer: Medicare HMO | Attending: Family Medicine

## 2019-08-15 DIAGNOSIS — R262 Difficulty in walking, not elsewhere classified: Secondary | ICD-10-CM | POA: Diagnosis not present

## 2019-08-15 DIAGNOSIS — R2689 Other abnormalities of gait and mobility: Secondary | ICD-10-CM | POA: Diagnosis not present

## 2019-08-15 DIAGNOSIS — R293 Abnormal posture: Secondary | ICD-10-CM | POA: Diagnosis not present

## 2019-08-15 DIAGNOSIS — R252 Cramp and spasm: Secondary | ICD-10-CM | POA: Insufficient documentation

## 2019-08-15 DIAGNOSIS — M62838 Other muscle spasm: Secondary | ICD-10-CM | POA: Insufficient documentation

## 2019-08-15 DIAGNOSIS — R296 Repeated falls: Secondary | ICD-10-CM | POA: Insufficient documentation

## 2019-08-15 DIAGNOSIS — G8250 Quadriplegia, unspecified: Secondary | ICD-10-CM

## 2019-08-15 DIAGNOSIS — G8254 Quadriplegia, C5-C7 incomplete: Secondary | ICD-10-CM | POA: Insufficient documentation

## 2019-08-15 NOTE — Therapy (Signed)
[place], [place], [address] Phone: [phone]   Fax:  [phone]  Physical Therapy Treatment  Patient Details  Name: Andrew Sullivan MRN: [id number] Date of Birth: [birth year] Referring Provider (PT): Madison Hickman, MD Progress Note Reporting Period 07/04/19 to 08/15/19  See note below for Objective Data and Assessment of Progress/Goals.       Encounter Date: 08/15/2019  PT End of Session - 08/15/19 1322    Visit Number  10    Number of Visits  13    Date for PT Re-Evaluation  09/07/19    Authorization Type  [employer] Medicare/Medicaid    PT Start Time  1200    PT Stop Time  1300    PT Time Calculation (min)  60 min    Activity Tolerance  Patient tolerated treatment well    Behavior During Therapy  [facility] for tasks assessed/performed       Past Medical History:  Diagnosis Date   Erectile dysfunction    Gross hematuria    History of recurrent UTIs    History of spinal cord injury    49 (age 41 fell from ladder) w/ cervical spine fx  s/p  c4 -- c6  fusion--  residual paraplegic bilateral legs (great below T8 uses w/c and can transfer self   Paraparesis of both lower limbs ([place])    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 49 in 50   Spastic tetraplegia ([place])     Past Surgical History:  Procedure Laterality Date   CERVICAL FUSION  1988   C4 -- C6 w/ spinal cord injury   CYSTOSCOPY WITH RETROGRADE PYELOGRAM, URETEROSCOPY AND STENT PLACEMENT Left 12/27/2013   Procedure: [place], URETEROSCOPY AND STENT PLACEMENT;  Surgeon: Reece Packer, MD;  Location: [place];  Service: Urology;  Laterality: Left;   CYSTOSCOPY/RETROGRADE/URETEROSCOPY Left 01/09/2014   Procedure: CYSTOSCOPY/RETROGRADE/URETEROSCOPY, URETERAL BIOPSY AND STENT EXCHANGE;  Surgeon: Alexis Frock, MD;  [facility];  Service: Urology;  Laterality: Left;   HIP  ARTHROPLASTY  08/12/2011   Procedure: ARTHROPLASTY BIPOLAR HIP;  Surgeon: Mauri Pole, MD;  [facility];  Service: Orthopedics;  Laterality: Left;  Hemi-arthroplasty    There were no vitals filed for this visit.  Subjective Assessment - 08/15/19 1220    Subjective  No pain today. Doing good.  Spasms better    Currently in Pain?  No/denies                        [facility] Adult PT Treatment/Exercise - 08/15/19 0001      Knee/Hip Exercises: Aerobic   Nustep  6 minutes UE/LE L2 -soft roller held by PT between knees       Knee/Hip Exercises: Machines for Strengthening   Cybex Knee Extension  Knee extension 5# bilateral 3 x 15x 3  to tolerance       Moist Heat Therapy   Number Minutes Moist Heat  15 Minutes    Moist Heat Location  --   posterior thighs     Manual Therapy   Manual therapy comments  Passive hamstring stretching with opposite LE overpressure. Prone Passive calf stretching , intermittent  bilater rolling into ER to decrease tone, passive hip flexor stretching in prone                  PT Long Term Goals - 08/15/19 1326      PT LONG  TERM GOAL #1   Title  verbalizes ongoing progressive HEP including fitness program at fitness club.if he is a member    Baseline  He reports stretching at home. Not going to gym    Status  On-going      PT LONG TERM GOAL #2   Title  reports pain with gait /standing in hamstrings </= 2/10     Status  Achieved      PT LONG TERM GOAL #4   Title  ambulates 200' with RW safely modified independent. to demo decr pain and incr function    Status  Achieved      PT LONG TERM GOAL #5   Title  Gait Velocity >0.5 ft/sec     Status  Unable to assess            Plan - 08/15/19 1323    Clinical Impression Statement  No changes but his tone appears better as able to gt SLR stretch with less resistance until we get to end range.    PT Treatment/Interventions  Passive range of motion;Manual techniques;Therapeutic  exercise;Gait training;Moist Heat    PT Next Visit Plan  continue stretcdhing and active exerdiswe on nustep and knee ext machine    PT Home Exercise Plan  purchase Yoga straps for self stretching    Consulted and Agree with Plan of Care  Patient       Patient will benefit from skilled therapeutic intervention in order to improve the following deficits and impairments:  Abnormal gait, Decreased balance, Decreased range of motion, Decreased strength, Difficulty walking, Increased muscle spasms, Impaired tone, Impaired UE functional use, Postural dysfunction, Pain  Visit Diagnosis: Other muscle spasm  Chronic spastic tetraplegia ([place])     Problem List Patient Active Problem List   Diagnosis Date Noted   Symptomatic anemia 05/13/2019   DVT (deep venous thrombosis) (HCC) 10/19/2018   Erectile dysfunction    Spastic tetraplegia ([place]) 12/06/2013   Normocytic anemia 11/05/2013   History of recurrent UTIs 05/19/2010   Functional quadriplegia ([place]) 05/10/2009    [name] PT 08/15/2019, 1:29 PM  [facility] [address] [place], [place], [address] Phone: [phone]   Fax:  [phone]  Name: Andrew Sullivan MRN: [id number] Date of Birth: 1970-06-20

## 2019-08-17 ENCOUNTER — Ambulatory Visit: Payer: Medicare HMO | Admitting: Physical Therapy

## 2019-08-18 ENCOUNTER — Other Ambulatory Visit: Payer: Self-pay

## 2019-08-18 ENCOUNTER — Encounter: Payer: Self-pay | Admitting: Physical Therapy

## 2019-08-18 ENCOUNTER — Other Ambulatory Visit: Payer: Self-pay | Admitting: Family Medicine

## 2019-08-18 ENCOUNTER — Ambulatory Visit: Payer: Medicare HMO | Admitting: Physical Therapy

## 2019-08-18 DIAGNOSIS — G8250 Quadriplegia, unspecified: Secondary | ICD-10-CM | POA: Diagnosis not present

## 2019-08-18 DIAGNOSIS — G8254 Quadriplegia, C5-C7 incomplete: Secondary | ICD-10-CM | POA: Diagnosis not present

## 2019-08-18 DIAGNOSIS — R532 Functional quadriplegia: Secondary | ICD-10-CM

## 2019-08-18 DIAGNOSIS — R262 Difficulty in walking, not elsewhere classified: Secondary | ICD-10-CM

## 2019-08-18 DIAGNOSIS — R293 Abnormal posture: Secondary | ICD-10-CM | POA: Diagnosis not present

## 2019-08-18 DIAGNOSIS — R2689 Other abnormalities of gait and mobility: Secondary | ICD-10-CM | POA: Diagnosis not present

## 2019-08-18 DIAGNOSIS — R252 Cramp and spasm: Secondary | ICD-10-CM | POA: Diagnosis not present

## 2019-08-18 DIAGNOSIS — M62838 Other muscle spasm: Secondary | ICD-10-CM

## 2019-08-18 DIAGNOSIS — R296 Repeated falls: Secondary | ICD-10-CM

## 2019-08-18 NOTE — Therapy (Signed)
[place], [place], [address] Phone: [phone]   Fax:  [phone]  Physical Therapy Treatment  Patient Details  Name: Andrew Sullivan MRN: [id number] Date of Birth: Jun 04, 1970 Referring Provider (PT): Madison Hickman, MD   Encounter Date: 08/18/2019   PT End of Session - 08/18/19 0855    Visit Number 11    Number of Visits 13    Date for PT Re-Evaluation 09/07/19    Authorization Type [employer] Medicare/Medicaid    PT Start Time 0745    PT Stop Time 0850    PT Time Calculation (min) 65 min           Past Medical History:  Diagnosis Date  . Erectile dysfunction   . Gross hematuria   . History of recurrent UTIs   . History of spinal cord injury    84 (age 2 fell from ladder) w/ cervical spine fx  s/p  c4 -- c6  fusion--  residual paraplegic bilateral legs (great below T8 uses w/c and can transfer self  . Paraparesis of both lower limbs ([place])    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 62 in 87  . Spastic tetraplegia ([place])     Past Surgical History:  Procedure Laterality Date  . CERVICAL FUSION  1988   C4 -- C6 w/ spinal cord injury  . CYSTOSCOPY WITH RETROGRADE PYELOGRAM, URETEROSCOPY AND STENT PLACEMENT Left 12/27/2013   Procedure: CYSTOSCOPY WITH RETROGRADE PYELOGRAM, URETEROSCOPY AND STENT PLACEMENT;  Surgeon: Reece Packer, MD;  Location: [place];  Service: Urology;  Laterality: Left;  . CYSTOSCOPY/RETROGRADE/URETEROSCOPY Left 01/09/2014   Procedure: CYSTOSCOPY/RETROGRADE/URETEROSCOPY, URETERAL BIOPSY AND STENT EXCHANGE;  Surgeon: Alexis Frock, MD;  [facility];  Service: Urology;  Laterality: Left;  . HIP ARTHROPLASTY  08/12/2011   Procedure: ARTHROPLASTY BIPOLAR HIP;  Surgeon: Mauri Pole, MD;  [facility];  Service: Orthopedics;  Laterality: Left;  Hemi-arthroplasty    There were no vitals filed for this visit.   Subjective Assessment -  08/18/19 0855    Subjective No pain. Feel better.    Currently in Pain? No/denies                             [facility] Adult PT Treatment/Exercise - 08/18/19 0001      Knee/Hip Exercises: Aerobic   Nustep 6 minutes UE/LE L2 -soft roller held by PT between knees       Knee/Hip Exercises: Machines for Strengthening   Cybex Knee Extension Knee extension 5# bilateral 3 x 15x 3  to tolerance       Moist Heat Therapy   Number Minutes Moist Heat 15 Minutes    Moist Heat Location --   posterior thighs     Manual Therapy   Manual therapy comments Passive hamstring stretching with opposite LE overpressure. Prone Passive calf stretching , intermittent  bilater rolling into ER to decrease tone, passive hip flexor stretching in prone                       PT Long Term Goals - 08/15/19 1326      PT LONG TERM GOAL #1   Title verbalizes ongoing progressive HEP including fitness program at fitness club.if he is a member    Baseline He reports stretching at home. Not going to gym    Status On-going      PT LONG  TERM GOAL #2   Title reports pain with gait /standing in hamstrings </= 2/10     Status Achieved      PT LONG TERM GOAL #4   Title ambulates 200' with RW safely modified independent. to demo decr pain and incr function    Status Achieved      PT LONG TERM GOAL #5   Title Gait Velocity >0.5 ft/sec     Status Unable to assess                 Plan - 08/18/19 0857    Clinical Impression Statement Pt reports improvement with PT. Less resistance with stretching. Continued with machines and PROM to increase flexibility. HMP at end of session to decrease muscle tension.    Comorbidities history of spinal cord injury in 1988 - fall from ladder causing C4-C6 resulting in spastic incomplete tetraplegia, cervical fusion, recurrent UTI, falls with L hip fracture requiring hip arthroplasty    PT Next Visit Plan continue stretcdhing and active exerdiswe on  nustep and knee ext machine    PT Home Exercise Plan purchase Yoga straps for self stretching           Patient will benefit from skilled therapeutic intervention in order to improve the following deficits and impairments:  Abnormal gait, Decreased balance, Decreased range of motion, Decreased strength, Difficulty walking, Increased muscle spasms, Impaired tone, Impaired UE functional use, Postural dysfunction, Pain  Visit Diagnosis: Other muscle spasm  Chronic spastic tetraplegia (HCC)  Quadriplegia, C5-C7 incomplete (HCC)  Abnormal posture  Cramp and spasm  Repeated falls  Difficulty in walking, not elsewhere classified  Other abnormalities of gait and mobility     Problem List Patient Active Problem List   Diagnosis Date Noted  . Symptomatic anemia 05/13/2019  . DVT (deep venous thrombosis) ([place]) 10/19/2018  . Erectile dysfunction   . Spastic tetraplegia ([place]) 12/06/2013  . Normocytic anemia 11/05/2013  . History of recurrent UTIs 05/19/2010  . Functional quadriplegia (HCC) 05/10/2009    Dorene Ar, PTA 08/18/2019, 9:02 AM  [place], [place], [address] Phone: [phone]   Fax:  [phone]  Name: Andrew Sullivan MRN: [id number] Date of Birth: 12/23/1970

## 2019-08-22 ENCOUNTER — Ambulatory Visit: Payer: Medicare HMO

## 2019-08-22 ENCOUNTER — Other Ambulatory Visit: Payer: Self-pay

## 2019-08-22 DIAGNOSIS — G8250 Quadriplegia, unspecified: Secondary | ICD-10-CM

## 2019-08-22 DIAGNOSIS — R2689 Other abnormalities of gait and mobility: Secondary | ICD-10-CM | POA: Diagnosis not present

## 2019-08-22 DIAGNOSIS — M62838 Other muscle spasm: Secondary | ICD-10-CM

## 2019-08-22 DIAGNOSIS — R296 Repeated falls: Secondary | ICD-10-CM | POA: Diagnosis not present

## 2019-08-22 DIAGNOSIS — G8254 Quadriplegia, C5-C7 incomplete: Secondary | ICD-10-CM | POA: Diagnosis not present

## 2019-08-22 DIAGNOSIS — R293 Abnormal posture: Secondary | ICD-10-CM | POA: Diagnosis not present

## 2019-08-22 DIAGNOSIS — R252 Cramp and spasm: Secondary | ICD-10-CM | POA: Diagnosis not present

## 2019-08-22 DIAGNOSIS — R262 Difficulty in walking, not elsewhere classified: Secondary | ICD-10-CM | POA: Diagnosis not present
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
## 2019-08-24 ENCOUNTER — Ambulatory Visit: Payer: Medicare HMO | Admitting: Physical Therapy

## 2019-08-24 ENCOUNTER — Other Ambulatory Visit: Payer: Self-pay

## 2019-08-24 DIAGNOSIS — R296 Repeated falls: Secondary | ICD-10-CM | POA: Diagnosis not present

## 2019-08-24 DIAGNOSIS — R293 Abnormal posture: Secondary | ICD-10-CM | POA: Diagnosis not present

## 2019-08-24 DIAGNOSIS — G8254 Quadriplegia, C5-C7 incomplete: Secondary | ICD-10-CM | POA: Diagnosis not present

## 2019-08-24 DIAGNOSIS — G8250 Quadriplegia, unspecified: Secondary | ICD-10-CM | POA: Diagnosis not present

## 2019-08-24 DIAGNOSIS — R2689 Other abnormalities of gait and mobility: Secondary | ICD-10-CM | POA: Diagnosis not present

## 2019-08-24 DIAGNOSIS — M62838 Other muscle spasm: Secondary | ICD-10-CM | POA: Diagnosis not present

## 2019-08-24 DIAGNOSIS — R262 Difficulty in walking, not elsewhere classified: Secondary | ICD-10-CM | POA: Diagnosis not present

## 2019-08-24 DIAGNOSIS — R252 Cramp and spasm: Secondary | ICD-10-CM | POA: Diagnosis not present
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
## 2019-10-13 ENCOUNTER — Other Ambulatory Visit: Payer: Self-pay | Admitting: Family Medicine

## 2019-10-13 DIAGNOSIS — G8250 Quadriplegia, unspecified: Secondary | ICD-10-CM

## 2019-10-13 DIAGNOSIS — R532 Functional quadriplegia: Secondary | ICD-10-CM

## 2019-11-07 ENCOUNTER — Ambulatory Visit (INDEPENDENT_AMBULATORY_CARE_PROVIDER_SITE_OTHER): Payer: Medicare HMO | Admitting: Family Medicine

## 2019-11-07 ENCOUNTER — Other Ambulatory Visit: Payer: Self-pay

## 2019-11-07 ENCOUNTER — Encounter: Payer: Self-pay | Admitting: Family Medicine

## 2019-11-07 VITALS — BP 118/76 | HR 65 | Ht 74.0 in | Wt 166.2 lb

## 2019-11-07 DIAGNOSIS — D649 Anemia, unspecified: Secondary | ICD-10-CM

## 2019-11-07 DIAGNOSIS — Z0000 Encounter for general adult medical examination without abnormal findings: Secondary | ICD-10-CM | POA: Diagnosis not present

## 2019-11-07 DIAGNOSIS — G8250 Quadriplegia, unspecified: Secondary | ICD-10-CM

## 2019-11-07 DIAGNOSIS — R532 Functional quadriplegia: Secondary | ICD-10-CM | POA: Diagnosis not present

## 2019-11-07 LAB — POCT URINALYSIS DIP (MANUAL ENTRY)
Bilirubin, UA: NEGATIVE
Glucose, UA: NEGATIVE mg/dL
Ketones, POC UA: NEGATIVE mg/dL
Leukocytes, UA: NEGATIVE
Nitrite, UA: NEGATIVE
Spec Grav, UA: >=1.03 — AB (ref 1.010–1.025)
Urobilinogen, UA: 0.2 E.U./dL
pH, UA: 6 (ref 5.0–8.0)

## 2019-11-07 MED ORDER — TETANUS-DIPHTH-ACELL PERTUSSIS 5-2.5-18.5 LF-MCG/0.5 IM SUSP: 0.5000 mL | Freq: Once | INTRAMUSCULAR | 0 refills | Status: AC

## 2019-11-07 MED ORDER — BACLOFEN 20 MG PO TABS: ORAL_TABLET | ORAL | 5 refills | Status: DC

## 2019-11-07 NOTE — Patient Instructions (Signed)
It was great to meet you!  Our plans for today:  - I have refilled your Baclofen - We are checking some basic labs today, I will call you with the results.  Take care and seek immediate care sooner if you develop any concerns.   Dr. [facility]
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
## 2019-11-07 NOTE — Assessment & Plan Note (Addendum)
Suspect iron deficiency anemia as the underlying etiology of his current fatigue. No findings on history to suggest depression, sleep apnea, other sleep disorder, or thyroid disease as causes for his fatigue. Most recent hemoglobin 8.9 on June 07, 2019 with MCV of 72.  Iron panel on 04/14/19 showed iron 15, ferritin 4, TIBC 451.  -Will obtain CBC, iron panel today -Obtain urinalysis for evaluation of hemoglobinuria  -Patient declines colonoscopy, continue to discuss in the future, consider GI referral pending CBC results -Will likely initiate supplemental iron therapy pending CBC results

## 2019-11-08 LAB — CBC
Hematocrit: 30.2 % — ABNORMAL LOW (ref 37.5–51.0)
Hemoglobin: 8.8 g/dL — ABNORMAL LOW (ref 13.0–17.7)
MCH: 20.7 pg — ABNORMAL LOW (ref 26.6–33.0)
MCHC: 29.1 g/dL — ABNORMAL LOW (ref 31.5–35.7)
MCV: 71 fL — ABNORMAL LOW (ref 79–97)
Platelets: 241 10*3/uL (ref 150–450)
RBC: 4.26 x10E6/uL (ref 4.14–5.80)
RDW: 16.7 % — ABNORMAL HIGH (ref 11.6–15.4)
WBC: 3.4 10*3/uL (ref 3.4–10.8)

## 2019-11-08 LAB — IRON,TIBC AND FERRITIN PANEL
Ferritin: 4 ng/mL — ABNORMAL LOW (ref 30–400)
Iron Saturation: 3 % — CL (ref 15–55)
Iron: 11 ug/dL — ABNORMAL LOW (ref 38–169)
Total Iron Binding Capacity: 378 ug/dL (ref 250–450)
UIBC: 367 ug/dL — ABNORMAL HIGH (ref 111–343)

## 2019-11-08 LAB — HEPATITIS C ANTIBODY: Hep C Virus Ab: <0.1 s/co ratio (ref 0.0–0.9)

## 2019-11-17 ENCOUNTER — Telehealth: Payer: Self-pay | Admitting: Family Medicine

## 2019-11-17 NOTE — Telephone Encounter (Signed)
Attempted to call patient x2 with lab results. Patient did not answer and voicemail is not set up.

## 2019-12-12 ENCOUNTER — Telehealth: Payer: Self-pay | Admitting: Family Medicine

## 2019-12-12 NOTE — Telephone Encounter (Signed)
I have attempted to call patient multiple times regarding abnormal lab results from office visit on 11/07/2019.  No answer and patient does not have voicemail set up so I am unable to leave message.

## 2019-12-27 ENCOUNTER — Encounter: Payer: Self-pay | Admitting: Family Medicine

## 2020-02-17 ENCOUNTER — Other Ambulatory Visit: Payer: Self-pay

## 2020-02-17 ENCOUNTER — Emergency Department (HOSPITAL_COMMUNITY)
Admission: EM | Admit: 2020-02-17 | Discharge: 2020-02-17 | Disposition: A | Discharge status: Home / Self Care | Payer: Medicare HMO | Attending: Emergency Medicine | Admitting: Emergency Medicine

## 2020-02-17 ENCOUNTER — Emergency Department (HOSPITAL_COMMUNITY): Payer: Medicare HMO

## 2020-02-17 ENCOUNTER — Encounter (HOSPITAL_COMMUNITY): Payer: Self-pay | Admitting: Emergency Medicine

## 2020-02-17 DIAGNOSIS — Z87891 Personal history of nicotine dependence: Secondary | ICD-10-CM | POA: Diagnosis not present

## 2020-02-17 DIAGNOSIS — R350 Frequency of micturition: Secondary | ICD-10-CM | POA: Insufficient documentation

## 2020-02-17 DIAGNOSIS — R079 Chest pain, unspecified: Secondary | ICD-10-CM | POA: Insufficient documentation

## 2020-02-17 DIAGNOSIS — R0789 Other chest pain: Secondary | ICD-10-CM

## 2020-02-17 DIAGNOSIS — J449 Chronic obstructive pulmonary disease, unspecified: Secondary | ICD-10-CM | POA: Diagnosis not present

## 2020-02-17 LAB — CBC
HCT: 35.1 % — ABNORMAL LOW (ref 39.0–52.0)
Hemoglobin: 9.6 g/dL — ABNORMAL LOW (ref 13.0–17.0)
MCH: 20.6 pg — ABNORMAL LOW (ref 26.0–34.0)
MCHC: 27.4 g/dL — ABNORMAL LOW (ref 30.0–36.0)
MCV: 75.5 fL — ABNORMAL LOW (ref 80.0–100.0)
Platelets: 223 10*3/uL (ref 150–400)
RBC: 4.65 MIL/uL (ref 4.22–5.81)
RDW: 17.4 % — ABNORMAL HIGH (ref 11.5–15.5)
WBC: 4 10*3/uL (ref 4.0–10.5)
nRBC: 0 % (ref 0.0–0.2)

## 2020-02-17 LAB — URINALYSIS, ROUTINE W REFLEX MICROSCOPIC
Bilirubin Urine: NEGATIVE
Glucose, UA: NEGATIVE mg/dL
Hgb urine dipstick: NEGATIVE
Ketones, ur: NEGATIVE mg/dL
Leukocytes,Ua: NEGATIVE
Nitrite: NEGATIVE
Protein, ur: NEGATIVE mg/dL
Specific Gravity, Urine: 1.024 (ref 1.005–1.030)
pH: 6 (ref 5.0–8.0)

## 2020-02-17 LAB — BASIC METABOLIC PANEL
Anion gap: 11 (ref 5–15)
BUN: 19 mg/dL (ref 6–20)
CO2: 25 mmol/L (ref 22–32)
Calcium: 9 mg/dL (ref 8.9–10.3)
Chloride: 105 mmol/L (ref 98–111)
Creatinine, Ser: 1.01 mg/dL (ref 0.61–1.24)
GFR, Estimated: >60 mL/min (ref >60)
Glucose, Bld: 116 mg/dL — ABNORMAL HIGH (ref 70–99)
Potassium: 3.9 mmol/L (ref 3.5–5.1)
Sodium: 141 mmol/L (ref 135–145)

## 2020-02-17 LAB — TROPONIN I (HIGH SENSITIVITY): Troponin I (High Sensitivity): 4 ng/L (ref <18)

## 2020-02-17 NOTE — ED Triage Notes (Signed)
Pt c/o cp for a week not getting any better, denies SOB. N/v/d. Pt states he thinks he has a bladder infection.

## 2020-02-17 NOTE — ED Provider Notes (Signed)
[facility] EMERGENCY DEPARTMENT Provider Note   CSN: [id number] Arrival date & time: 02/17/20  1220     History Chief Complaint  Patient presents with  . Chest Pain    Andrew Sullivan is a 49 y.o. male.  Andrew Sullivan is a 49 y.o. male with a history of functional quadriplegia from spinal cord injury in 1988, UTIs, DVT, anemia, who presents to the emergency department for evaluation of chest pain.  Patient states that over the past 2 weeks he has been having intermittent episodes of chest pain.  He states that this chest pain is located over the left upper chest wall every time, it describes it as a sharp chest pain that is brief and lasts only a second or 2.  This happens on and off approximately every other day.  He denies any radiation of the pain.  No associated shortness of breath, no lightheadedness or syncope.  No cough.  No abdominal pain, nausea, vomiting or diaphoresis.  He denies any noted triggers to the pain, has not had similar pain previously and denies any cardiac history.  Does have history of DVT, no history of PE, not currently on any blood thinners.  No lower extremity swelling or pain.  No meds prior to arrival.  Pain did not become more frequent today, but he just became concerned about what could be causing it so decided to come in for evaluation.  Also reports concern that he could have a UTI, has a history of them previously, urinates normally does not have to self cath, has had some frequency of urination, no dysuria, hematuria or discharge.  No flank or abdominal pain.  No fevers or chills.  No other aggravating or alleviating factors.        Past Medical History:  Diagnosis Date  . Erectile dysfunction   . Gross hematuria   . History of recurrent UTIs   . History of spinal cord injury    35 (age 32 fell from ladder) w/ cervical spine fx  s/p  c4 -- c6  fusion--  residual paraplegic bilateral legs (great below T8 uses w/c and can  transfer self  . Paraparesis of both lower limbs ([place])    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 68 in 45  . Spastic tetraplegia [facility])     Patient Active Problem List   Diagnosis Date Noted  . Symptomatic anemia 05/13/2019  . DVT (deep venous thrombosis) ([place]) 10/19/2018  . Erectile dysfunction   . Spastic tetraplegia ([place]) 12/06/2013  . Normocytic anemia 11/05/2013  . History of recurrent UTIs 05/19/2010  . Functional quadriplegia ([place]) 05/10/2009    Past Surgical History:  Procedure Laterality Date  . CERVICAL FUSION  1988   C4 -- C6 w/ spinal cord injury  . CYSTOSCOPY WITH RETROGRADE PYELOGRAM, URETEROSCOPY AND STENT PLACEMENT Left 12/27/2013   Procedure: CYSTOSCOPY WITH RETROGRADE PYELOGRAM, URETEROSCOPY AND STENT PLACEMENT;  Surgeon: Reece Packer, MD;  Location: [facility];  Service: Urology;  Laterality: Left;  . CYSTOSCOPY/RETROGRADE/URETEROSCOPY Left 01/09/2014   Procedure: CYSTOSCOPY/RETROGRADE/URETEROSCOPY, URETERAL BIOPSY AND STENT EXCHANGE;  Surgeon: Alexis Frock, MD;  Location: [facility];  Service: Urology;  Laterality: Left;  . HIP ARTHROPLASTY  08/12/2011   Procedure: ARTHROPLASTY BIPOLAR HIP;  Surgeon: Mauri Pole, MD;  Location: [facility];  Service: Orthopedics;  Laterality: Left;  Hemi-arthroplasty       Family History  Problem Relation Age of Onset  . Arthritis Mother   . Hypertension  Mother     Social History   Tobacco Use  . Smoking status: Former Smoker    Packs/day: 0.25    Years: 0.50    Pack years: 0.12    Types: Cigarettes    Quit date: 12/27/1986    Years since quitting: 33.1  . Smokeless tobacco: Never Used  Vaping Use  . Vaping Use: Never used  Substance Use Topics  . Alcohol use: No  . Drug use: No    Home Medications Prior to Admission medications   Medication Sig Start Date End Date Taking? Authorizing Provider  baclofen (LIORESAL) 20 MG tablet Take 1 tablet by mouth 4 times daily Patient taking  differently: Take 20 mg by mouth 4 (four) times daily. 11/07/19  Yes Alcus Dad, MD  tadalafil (CIALIS) 5 MG tablet Take 5 mg by mouth daily as needed for erectile dysfunction. 09/13/19   [provider]  baclofen (LIORESAL) 20 MG tablet Take 1 tablet by mouth 4 times daily Patient taking differently: Take 20 mg by mouth 4 (four) times daily.  04/13/19   Nuala Alpha, DO    Allergies    Penicillins  Review of Systems   Review of Systems  Constitutional: Negative for chills and fever.  HENT: Negative.   Respiratory: Negative for cough and shortness of breath.   Cardiovascular: Positive for chest pain. Negative for palpitations and leg swelling.  Gastrointestinal: Negative for abdominal pain, nausea and vomiting.  Genitourinary: Positive for frequency. Negative for dysuria.  Musculoskeletal: Negative for arthralgias and myalgias.  Skin: Negative for color change and rash.  Neurological: Negative for dizziness, syncope and light-headedness.  All other systems reviewed and are negative.   Physical Exam Updated Vital Signs BP 132/70   Pulse 73   Temp 98.6 F (37 C) (Oral)   Resp 20   Ht 6\' 3"  (1.905 m)   Wt 79.8 kg   SpO2 100%   BMI 22.00 kg/m   Physical Exam Vitals and nursing note reviewed.  Constitutional:      General: He is not in acute distress.    Appearance: He is well-developed and well-nourished. He is not ill-appearing or diaphoretic.     Comments: Well-appearing and in no distress, arrives in motorized wheel chair  HENT:     Head: Normocephalic and atraumatic.     Mouth/Throat:     Mouth: Oropharynx is clear and moist.  Eyes:     General:        Right eye: No discharge.        Left eye: No discharge.     Extraocular Movements: EOM normal.     Pupils: Pupils are equal, round, and reactive to light.  Cardiovascular:     Rate and Rhythm: Normal rate and regular rhythm.     Pulses: Intact distal pulses.          Radial pulses are 2+ on the right  side and 2+ on the left side.     Heart sounds: Normal heart sounds. No murmur heard. No friction rub. No gallop.   Pulmonary:     Effort: Pulmonary effort is normal. No respiratory distress.     Breath sounds: Normal breath sounds. No wheezing or rales.     Comments: Respirations equal and unlabored, patient able to speak in full sentences, lungs clear to auscultation bilaterally  Chest:     Chest wall: No tenderness.     Comments: Chest wall NTTP Abdominal:     General: Bowel sounds are  normal. There is no distension.     Palpations: Abdomen is soft. There is no mass.     Tenderness: There is no abdominal tenderness. There is no guarding.     Comments: Abdomen soft, nondistended, nontender to palpation in all quadrants without guarding or peritoneal signs   Musculoskeletal:        General: No deformity or edema.     Cervical back: Neck supple.     Right lower leg: No tenderness. No edema.     Left lower leg: No tenderness. No edema.  Skin:    General: Skin is warm and dry.     Capillary Refill: Capillary refill takes less than 2 seconds.  Neurological:     Mental Status: He is alert.     Coordination: Coordination normal.     Comments: Speech is clear, able to follow commands Patient with functional quadriplegia but able to sit himself up using his upper extremities and move his lower legs somewhat.     ED Results / Procedures / Treatments   Labs (all labs ordered are listed, but only abnormal results are displayed) Labs Reviewed  BASIC METABOLIC PANEL - Abnormal; Notable for the following components:      Result Value   Glucose, Bld 116 (*)    All other components within normal limits  CBC - Abnormal; Notable for the following components:   Hemoglobin 9.6 (*)    HCT 35.1 (*)    MCV 75.5 (*)    MCH 20.6 (*)    MCHC 27.4 (*)    RDW 17.4 (*)    All other components within normal limits  URINALYSIS, ROUTINE W REFLEX MICROSCOPIC  TROPONIN I (HIGH SENSITIVITY)     EKG EKG Interpretation  Date/Time:  Friday February 17 2020 12:19:22 EST Ventricular Rate:  76 PR Interval:  110 QRS Duration: 92 QT Interval:  364 QTC Calculation: 409 R Axis:   89 Text Interpretation: Sinus rhythm with short PR Early repolarization Otherwise normal ECG No significant change since last tracing Confirmed by Pattricia Boss [phone]) on 02/17/2020 1:03:19 PM   Radiology DG Chest 2 View  Result Date: 02/17/2020 CLINICAL DATA:  Chest pain for 2 weeks. EXAM: CHEST - 2 VIEW COMPARISON:  Two-view chest x-ray 04/14/2019 FINDINGS: Heart size is normal. Changes of COPD are noted. No edema or effusion is present. No focal airspace disease is present. IMPRESSION: 1. No acute cardiopulmonary disease. 2. Changes of COPD. Electronically Signed   By: San Morelle M.D.   On: 02/17/2020 13:12    Procedures Procedures (including critical care time)  Medications Ordered in ED Medications - No data to display  ED Course  I have reviewed the triage vital signs and the nursing notes.  Pertinent labs & imaging results that were available during my care of the patient were reviewed by me and considered in my medical decision making (see chart for details).    MDM Rules/Calculators/A&P                          Patient presents to the emergency department with chest pain. Patient nontoxic appearing, in no apparent distress, vitals without significant abnormality. Fairly benign physical exam. No prior cardiac history, is not followed by a cardiologist  DDX: ACS, pulmonary embolism, dissection, pneumothorax, pneumonia, arrhythmia, severe anemia, MSK, GERD, anxiety. Evaluation initiated with labs, EKG, and CXR. Patient on cardiac monitor.   CBC: No leukocytosis, history of chronic anemia, with hemoglobin at  baseline today at 9.6 BMP: Glucose of 116 but no other electrolyte derangements, normal renal function Troponin: Negative EKG: Sinus rhythm with some early repole, no  significant change when compared to prior CXR:  Negative, without infiltrate, effusion, pneumothorax, or fracture/dislocation.   EKG without obvious acute ischemia, initial troponin negative, given that pain has been present for 2 weeks intermittently do not feel that delta troponin is indicated, doubt ACS.  Patient is not PERC negative due to prior DVT history, but pain is intermittent and very atypical, he is not tachycardic, tachypneic or hypoxic, doubt pulmonary embolism. Pain is not a tearing sensation, symmetric pulses, no widening of mediastinum on CXR, doubt dissection. Cardiac monitor reviewed, no notable arrhythmias or tachycardia. Patient has appeared hemodynamically stable throughout ER visit and appears safe for discharge with close PCP/cardiology follow up. I discussed results, treatment plan, need for PCP follow-up, and return precautions with the patient. Provided opportunity for questions, patient confirmed understanding and is in agreement with plan.   Final Clinical Impression(s) / ED Diagnoses Final diagnoses:  Intermittent left-sided chest pain    Rx / DC Orders ED Discharge Orders    None       Jacqlyn Larsen, [place] 02/17/20 1518    Pattricia Boss, MD 02/17/20 [phone]

## 2020-02-17 NOTE — Discharge Instructions (Addendum)
You were seen in the emergency department today for chest pain. Your work-up in the emergency department has been overall reassuring. Your labs have been fairly normal and or similar to previous blood work you have had done. Your EKG and the enzyme we use to check your heart did not show an acute heart attack at this time. Your chest x-ray was normal.  ° °We would like you to follow up closely with your primary care provider and/or the cardiologist provided in your discharge instructions within 1-3 days. Return to the ER immediately should you experience any new or worsening symptoms including but not limited to return of pain, worsened pain, vomiting, shortness of breath, dizziness, lightheadedness, passing out, or any other concerns that you may have.   °

## 2020-02-17 NOTE — ED Notes (Signed)
Pt d/c home per MD order. Discharge summary reviewed with pt, pt verbalizes understanding. No s/s of acute distress noted. At discharge. Off unit in home electric wheelchair.

## 2020-03-05 DIAGNOSIS — R351 Nocturia: Secondary | ICD-10-CM | POA: Diagnosis not present

## 2020-03-05 DIAGNOSIS — N319 Neuromuscular dysfunction of bladder, unspecified: Secondary | ICD-10-CM | POA: Diagnosis not present

## 2020-03-30 DIAGNOSIS — R310 Gross hematuria: Secondary | ICD-10-CM | POA: Diagnosis not present

## 2020-03-30 DIAGNOSIS — N319 Neuromuscular dysfunction of bladder, unspecified: Secondary | ICD-10-CM | POA: Diagnosis not present

## 2020-05-13 ENCOUNTER — Other Ambulatory Visit: Payer: Self-pay | Admitting: Family Medicine

## 2020-05-13 DIAGNOSIS — G8250 Quadriplegia, unspecified: Secondary | ICD-10-CM

## 2020-05-13 DIAGNOSIS — R532 Functional quadriplegia: Secondary | ICD-10-CM

## 2020-05-14 NOTE — Telephone Encounter (Signed)
Patient calls nurse line regarding rx refill. Advised that we received and routed to provider this AM. Patient is asking amount of time that it will take for rx to be sent to pharmacy. Advised patient of 24-48 hour rx refill policy. Patient verbalizes understanding, however, is completely out of medication. Patient has also scheduled follow up appointment with PCP on 06/07/20.   Please advise.   Talbot Grumbling, RN

## 2020-05-15 ENCOUNTER — Other Ambulatory Visit: Payer: Self-pay | Admitting: Family Medicine

## 2020-05-15 DIAGNOSIS — R532 Functional quadriplegia: Secondary | ICD-10-CM

## 2020-05-15 DIAGNOSIS — G8250 Quadriplegia, unspecified: Secondary | ICD-10-CM

## 2020-06-04 DIAGNOSIS — N319 Neuromuscular dysfunction of bladder, unspecified: Secondary | ICD-10-CM | POA: Diagnosis not present

## 2020-06-04 DIAGNOSIS — R351 Nocturia: Secondary | ICD-10-CM | POA: Diagnosis not present

## 2020-06-04 DIAGNOSIS — Z125 Encounter for screening for malignant neoplasm of prostate: Secondary | ICD-10-CM | POA: Diagnosis not present

## 2020-06-04 DIAGNOSIS — R310 Gross hematuria: Secondary | ICD-10-CM | POA: Diagnosis not present

## 2020-06-06 NOTE — Progress Notes (Signed)
    SUBJECTIVE:   CHIEF COMPLAINT / HPI:   Medication Refill Patient requests refills on his Baclofen. Has been on Baclofen QID for many years due to spastic tetraplegia s/p ladder accident in 1988.   ?Bed sore Patient thinks he might have a bedsore on his right buttock. He first noticed pain 3-4 days ago. Hurts to sit directly on it, so patient has been trying to change positions frequently. Has not used any barrier creams or tried anything for relief. No prior hx of bed sores. Patient is mostly wheelchair bound but states he is able to walk short distances at home.  Iron Deficiency Anemia CBC and iron panel were checked at his last visit on 11/07/2019 due to fatigue. He was found to have significant iron deficiency anemia. He is not currently taking any iron supplementation. Has declined colonoscopy in the past. Denies melena or hematochezia. Denies any symptoms of anemia other than intermittent fatigue.   PERTINENT  PMH / PSH: spastic tetraplegia (s/p accident in 1988), iron deficiency anemia, DVT  OBJECTIVE:   BP 110/64   Pulse 90   Ht 6\' 3"  (1.905 m)   SpO2 97%   BMI 22.00 kg/m   Gen: alert, well-appearing, NAD CV: RRR, normal S1/S2 Resp: normal WOB Skin: multilobular perianal mass with granulation tissue and small amount of bleeding.  Neuro: functional quadriplegia noted, patient seated in motorized wheelchair  ASSESSMENT/PLAN:   Spastic tetraplegia (HCC) Chronic, stable. -Refills provided for Baclofen 20mg  QID  Iron deficiency anemia Etiology unknown. Must consider GI source and need to rule-out colorectal carcinoma given iron deficiency anemia and new rectal mass/pain. Patient has declined colonoscopy in the past. Most recent Hgb 9.6 (stable/improved from prior). Iron 11, Ferritin 4, and iron saturation 3% on 11/07/2019.  -Start Ferrous sulfate 324mg  PO daily (or qod as tolerated) -Referral to GI for colonoscopy  Perianal mass Multilobular perianal mass w/granulation  tissue and scant bleeding noted on exam. Patient noticed discomfort 3-4 days ago, however suspect it has been present longer than this. Differential includes Grade IV hemorrhoids, rectal prolapse, benign polyps, colorectal carcinoma, atypical pressure injury.  -Referral to GI for colonoscopy -Referral to Gen Surg for possible biopsy   Case discussed with Dr. Noralee Chars, [place]

## 2020-06-07 ENCOUNTER — Other Ambulatory Visit: Payer: Self-pay

## 2020-06-07 ENCOUNTER — Ambulatory Visit (INDEPENDENT_AMBULATORY_CARE_PROVIDER_SITE_OTHER): Payer: Medicare HMO | Admitting: Family Medicine

## 2020-06-07 ENCOUNTER — Encounter: Payer: Self-pay | Admitting: Family Medicine

## 2020-06-07 VITALS — BP 110/64 | HR 90 | Ht 75.0 in

## 2020-06-07 DIAGNOSIS — D509 Iron deficiency anemia, unspecified: Secondary | ICD-10-CM | POA: Diagnosis not present

## 2020-06-07 DIAGNOSIS — K6289 Other specified diseases of anus and rectum: Secondary | ICD-10-CM | POA: Diagnosis not present

## 2020-06-07 DIAGNOSIS — K644 Residual hemorrhoidal skin tags: Secondary | ICD-10-CM

## 2020-06-07 DIAGNOSIS — R532 Functional quadriplegia: Secondary | ICD-10-CM | POA: Diagnosis not present

## 2020-06-07 DIAGNOSIS — G8250 Quadriplegia, unspecified: Secondary | ICD-10-CM

## 2020-06-07 MED ORDER — BACLOFEN 20 MG PO TABS: 20.0000 mg | ORAL_TABLET | Freq: Four times a day (QID) | ORAL | 3 refills | Status: DC

## 2020-06-07 MED ORDER — FERROUS SULFATE 325 (65 FE) MG PO TABS
325.0000 mg | ORAL_TABLET | Freq: Every day | ORAL | 3 refills | Status: DC
Start: 2020-06-07 — End: 2021-01-04

## 2020-06-07 MED ORDER — HYDROCORTISONE (PERIANAL) 1 % EX CREA
TOPICAL_CREAM | CUTANEOUS | 0 refills | Status: DC
Start: 2020-06-07 — End: 2020-12-28

## 2020-06-07 NOTE — Assessment & Plan Note (Signed)
Chronic, stable. -Refills provided for Baclofen 20mg  QID

## 2020-06-07 NOTE — Assessment & Plan Note (Addendum)
Multilobular perianal mass w/granulation tissue and scant bleeding noted on exam. Patient noticed discomfort 3-4 days ago, however suspect it has been present longer than this. Differential includes Grade IV hemorrhoids, rectal prolapse, benign polyps, colorectal carcinoma, atypical pressure injury.  -Referral to GI for colonoscopy -Referral to Gen Surg for possible biopsy

## 2020-06-07 NOTE — Assessment & Plan Note (Addendum)
Etiology unknown. Must consider GI source and need to rule-out colorectal carcinoma given iron deficiency anemia and new rectal mass/pain. Patient has declined colonoscopy in the past. Most recent Hgb 9.6 (stable/improved from prior). Iron 11, Ferritin 4, and iron saturation 3% on 11/07/2019.  -Start Ferrous sulfate 324mg  PO daily (or qod as tolerated) -Referral to GI for colonoscopy

## 2020-06-07 NOTE — Patient Instructions (Addendum)
It was great to see you!  Our plans for today:  - I have placed referrals to general surgery and GI. They will call you to schedule appointments. It is extremely important that you follow-up with them. - Start taking Iron 325mg  daily (or every other day if you're experiencing GI side effects)  Take care and seek immediate care sooner if you develop any concerns.   Dr. [facility]

## 2020-06-14 ENCOUNTER — Encounter: Payer: Self-pay | Admitting: Physician Assistant

## 2020-06-14 ENCOUNTER — Ambulatory Visit (INDEPENDENT_AMBULATORY_CARE_PROVIDER_SITE_OTHER): Payer: Medicare HMO | Admitting: Physician Assistant

## 2020-06-14 ENCOUNTER — Other Ambulatory Visit: Payer: Self-pay

## 2020-06-14 ENCOUNTER — Other Ambulatory Visit (INDEPENDENT_AMBULATORY_CARE_PROVIDER_SITE_OTHER): Payer: Medicare HMO

## 2020-06-14 VITALS — BP 124/80 | HR 68

## 2020-06-14 DIAGNOSIS — K6289 Other specified diseases of anus and rectum: Secondary | ICD-10-CM

## 2020-06-14 DIAGNOSIS — D509 Iron deficiency anemia, unspecified: Secondary | ICD-10-CM

## 2020-06-14 DIAGNOSIS — K649 Unspecified hemorrhoids: Secondary | ICD-10-CM | POA: Diagnosis not present

## 2020-06-14 LAB — CBC WITH DIFFERENTIAL/PLATELET
Basophils Absolute: 0 10*3/uL (ref 0.0–0.1)
Basophils Relative: 1.4 % (ref 0.0–3.0)
Eosinophils Absolute: 0.2 10*3/uL (ref 0.0–0.7)
Eosinophils Relative: 4.6 % (ref 0.0–5.0)
HCT: 35.6 % — ABNORMAL LOW (ref 39.0–52.0)
Hemoglobin: 11 g/dL — ABNORMAL LOW (ref 13.0–17.0)
Lymphocytes Relative: 22.4 % (ref 12.0–46.0)
Lymphs Abs: 0.8 10*3/uL (ref 0.7–4.0)
MCHC: 30.8 g/dL (ref 30.0–36.0)
MCV: 74.1 fl — ABNORMAL LOW (ref 78.0–100.0)
Monocytes Absolute: 0.4 10*3/uL (ref 0.1–1.0)
Monocytes Relative: 12.2 % — ABNORMAL HIGH (ref 3.0–12.0)
Neutro Abs: 2.1 10*3/uL (ref 1.4–7.7)
Neutrophils Relative %: 59.4 % (ref 43.0–77.0)
Platelets: 208 10*3/uL (ref 150.0–400.0)
RBC: 4.81 Mil/uL (ref 4.22–5.81)
RDW: 19.1 % — ABNORMAL HIGH (ref 11.5–15.5)
WBC: 3.6 10*3/uL — ABNORMAL LOW (ref 4.0–10.5)

## 2020-06-14 LAB — COMPREHENSIVE METABOLIC PANEL
ALT: 11 U/L (ref 0–53)
AST: 17 U/L (ref 0–37)
Albumin: 4.1 g/dL (ref 3.5–5.2)
Alkaline Phosphatase: 49 U/L (ref 39–117)
BUN: 14 mg/dL (ref 6–23)
CO2: 29 mEq/L (ref 19–32)
Calcium: 9 mg/dL (ref 8.4–10.5)
Chloride: 106 mEq/L (ref 96–112)
Creatinine, Ser: 1.01 mg/dL (ref 0.40–1.50)
GFR: 87.12 mL/min (ref >60.00)
Glucose, Bld: 105 mg/dL — ABNORMAL HIGH (ref 70–99)
Potassium: 4 mEq/L (ref 3.5–5.1)
Sodium: 141 mEq/L (ref 135–145)
Total Bilirubin: 0.4 mg/dL (ref 0.2–1.2)
Total Protein: 6.9 g/dL (ref 6.0–8.3)

## 2020-06-14 LAB — IBC PANEL
Iron: 136 ug/dL (ref 42–165)
Saturation Ratios: 32.6 % (ref 20.0–50.0)
Transferrin: 298 mg/dL (ref 212.0–360.0)

## 2020-06-14 LAB — FERRITIN: Ferritin: 26.6 ng/mL (ref 22.0–322.0)

## 2020-06-14 NOTE — Progress Notes (Signed)
Chief Complaint: Anemia  HPI:    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      06/07/2020 patient seen by PCP and at that time they discussed iron deficiency anemia.  CBC and iron panel had been checked on 11/07/2019 due to his fatigue and he was found to have significant iron deficiency anemia.  He was not taking any iron supplementation and had declined colonoscopy in the past.  At that time he was started on ferrous sulfate 324 mg p.o. daily.  He had a rectal at that time was found to have a multilobular perianal mass with granulation tissue and scant bleeding noted on exam.  He described 3 to 4 days of discomfort prior to that.    11/07/2019 iron studies with a TIBC normal, iron low at 11, iron percent saturation low at 3% and ferritin low at 4.    02/17/2020 CBC with hemoglobin of 9.6 (8.8 on 11/07/2019), MCV low at 75.5, platelets normal.    Today, the patient presents to clinic and tells me that he was initially told he was anemic back in September of last year, but was never told to start iron or anything.  He was just recently started on iron at his last office visit at the end of March.  Explains that he has having some discomfort towards his bottom, but more higher up.  Tells me that his PCP did not tell him there was any abnormality on rectal exam.  Describes that he was feeling fatigued but that has gotten some better over the past couple of months.  Denies any changes in bowel habits, does describe chronically giving himself a laxative on Mondays in order to keep things moving.    Patient lives at home by himself, he has no home health care nurses.    Denies fever, chills, weight loss, abdominal pain or symptoms that awaken him from sleep.  Past Medical History:   Diagnosis Date  . Erectile dysfunction   . Gross hematuria   . History of recurrent UTIs   . History of spinal cord injury    40 (age 17 fell from ladder) w/ cervical spine fx  s/p  c4 -- c6  fusion--  residual paraplegic bilateral legs (great below T8 uses w/c and can transfer self  . Paraparesis of both lower limbs ([place])    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 71 in 42  . Spastic tetraplegia ([place])     Past Surgical History:  Procedure Laterality Date  . CERVICAL FUSION  1988   C4 -- C6 w/ spinal cord injury  . CYSTOSCOPY WITH RETROGRADE PYELOGRAM, URETEROSCOPY AND STENT PLACEMENT Left 12/27/2013   Procedure: CYSTOSCOPY WITH RETROGRADE PYELOGRAM, URETEROSCOPY AND STENT PLACEMENT;  Surgeon: Reece Packer, MD;  Location: [facility];  Service: Urology;  Laterality: Left;  . CYSTOSCOPY/RETROGRADE/URETEROSCOPY Left 01/09/2014   Procedure: CYSTOSCOPY/RETROGRADE/URETEROSCOPY, URETERAL BIOPSY AND STENT EXCHANGE;  Surgeon: Alexis Frock, MD;  Location: [facility];  Service: Urology;  Laterality: Left;  . HIP ARTHROPLASTY  08/12/2011   Procedure: ARTHROPLASTY BIPOLAR HIP;  Surgeon: Mauri Pole, MD;  Location: [facility];  Service: Orthopedics;  Laterality: Left;  Hemi-arthroplasty    Current Outpatient Medications  Medication Sig Dispense Refill  . baclofen (LIORESAL) 20 MG tablet  Take 1 tablet (20 mg total) by mouth 4 (four) times daily. 120 tablet 3  . ferrous sulfate 325 (65 FE) MG tablet Take 1 tablet (325 mg total) by mouth daily with breakfast. 90 tablet 3  . Hydrocortisone, Perianal, (PROCTO-PAK) 1 % CREA Apply to rectal area twice daily as needed 28 g 0  . tadalafil (CIALIS) 5 MG tablet Take 5 mg by mouth daily as needed for erectile dysfunction.     No current facility-administered medications for this visit.    Allergies as of 06/14/2020 - Review Complete 06/07/2020  Allergen Reaction Noted  . Penicillins Itching and Rash 05/10/2009    Family History   Problem Relation Age of Onset  . Arthritis Mother   . Hypertension Mother     Social History   Socioeconomic History  . Marital status: Single    Spouse name: Not on file  . Number of children: Not on file  . Years of education: 66  . Highest education level: 12th grade  Occupational History  . Not on file  Tobacco Use  . Smoking status: Former Smoker    Packs/day: 0.25    Years: 0.50    Pack years: 0.12    Types: Cigarettes    Quit date: 12/27/1986    Years since quitting: 33.4  . Smokeless tobacco: Never Used  Vaping Use  . Vaping Use: Never used  Substance and Sexual Activity  . Alcohol use: No  . Drug use: No  . Sexual activity: Yes  Other Topics Concern  . Not on file  Social History Narrative   Lives alone in apartment. Ground level. No pets.1 brother 1 sister. Remains active.  Is on disability secondary to neck fracture. Stays active going to gym and church. Uses [employer] for transportation. No smoke alarms.      Likes to eat chicken, rice, some greens, apples. Likes to drink water, milk, cranberry juice, unsweet tea.      No hobbies, likes to watch TV, goes to gym.    Social Determinants of Health   Financial Resource Strain: Not on file  Food Insecurity: Not on file  Transportation Needs: Not on file  Physical Activity: Not on file  Stress: Not on file  Social Connections: Not on file  Intimate Partner Violence: Not on file    Review of Systems:    Constitutional: No weight loss, fever or chills Skin: No rash  Cardiovascular: No chest pain Respiratory: No SOB  Gastrointestinal: See HPI and otherwise negative Genitourinary: No dysuria  Neurological: No headache, dizziness or syncope Musculoskeletal: No new muscle or joint pain Hematologic: No bleeding Psychiatric: No history of depression or anxiety   Physical Exam:  Vital signs: BP 124/80   Pulse 68  (wheelchair) Constitutional:   Pleasant AA male appears to be in NAD, Well developed, Well  nourished, alert and cooperative Head:  Normocephalic and atraumatic. Eyes:   PEERL, EOMI. No icterus. Conjunctiva pink. Ears:  Normal auditory acuity. Neck:  Supple Throat: Oral cavity and pharynx without inflammation, swelling or lesion.  Respiratory: Respirations even and unlabored. Lungs clear to auscultation bilaterally.   No wheezes, crackles, or rhonchi.  Cardiovascular: Normal S1, S2. No MRG. Regular rate and rhythm. No peripheral edema, cyanosis or pallor.  Gastrointestinal:  Soft, nondistended, nontender. No rebound or guarding. Normal bowel sounds. No appreciable masses or hepatomegaly. Rectal: External: A large amount of external hemorrhoid tissue with some question of anal warts for skin tags as well around the rectal opening,  small 1 cm circumferential sacral decubitus ulcer which is tender to palpation; internal: Tender to palpation; anoscopy: Grade 2 hemorrhoids and at least a 1 inch round lesion with ulceration, tender to palpation Msk:  Symmetrical without gross deformities. +ambulates in wheelchair Neurologic:  Alert and  oriented x4; +paraplegic Skin:   Dry and intact without significant lesions or rashes. Psychiatric:  Demonstrates good judgement and reason without abnormal affect or behaviors.  No recent labs or imaging.  Assessment: 1.  IDA: Since August of last year; consider GI source versus other 2.  Abnormal rectal exam: With question of hemorrhoid tissue versus mass 3.  Hemorrhoids: External and internal on exam  Plan: 1.  Repeat CBC, CMP and iron studies today including ferritin 2.  Discussed with patient that there was some abnormality on rectal exam.  This still could be hemorrhoid tissue, but we need to ensure that it is not cancer.  Answered patient's questions. 3.  Prescribed Hydrocortisone ointment to be applied to external hemorrhoids/hemorrhoid tissue twice daily for the next 2 weeks. 4.  Discussed sacral decubitus ulcer, this is painful to the patient.   Would recommend that he put some barrier ointment i.e. Desitin or A&D on this area and cover with a gauze pad to keep it clean and dry and protected. 5.  Patient was arranged for an EGD and colonoscopy at the hospital given his wheelchair status with Dr. Hilarie Fredrickson.  Did discuss the case with Dr. Hilarie Fredrickson at time of patient's visit.  We scheduled him for 06/26/2020.  Did provide the patient with a detailed list of risks for procedures and he agrees to proceed.  He will be COVID tested prior to time of procedures. 6.  Continue iron for now, may need an iron infusion given that ferritin was only 4 at time of last check in August. 7.  Patient to follow in clinic per recommendations from Dr. Hilarie Fredrickson after time of procedure.  Ellouise Newer, PA-C [facility] 06/14/2020, 1:15 PM  Cc: Alcus Dad, MD

## 2020-06-14 NOTE — H&P (View-Only) (Signed)
Chief Complaint: Anemia  HPI:    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      06/07/2020 patient seen by PCP and at that time they discussed iron deficiency anemia.  CBC and iron panel had been checked on 11/07/2019 due to his fatigue and he was found to have significant iron deficiency anemia.  He was not taking any iron supplementation and had declined colonoscopy in the past.  At that time he was started on ferrous sulfate 324 mg p.o. daily.  He had a rectal at that time was found to have a multilobular perianal mass with granulation tissue and scant bleeding noted on exam.  He described 3 to 4 days of discomfort prior to that.    11/07/2019 iron studies with a TIBC normal, iron low at 11, iron percent saturation low at 3% and ferritin low at 4.    02/17/2020 CBC with hemoglobin of 9.6 (8.8 on 11/07/2019), MCV low at 75.5, platelets normal.    Today, the patient presents to clinic and tells me that he was initially told he was anemic back in September of last year, but was never told to start iron or anything.  He was just recently started on iron at his last office visit at the end of March.  Explains that he has having some discomfort towards his bottom, but more higher up.  Tells me that his PCP did not tell him there was any abnormality on rectal exam.  Describes that he was feeling fatigued but that has gotten some better over the past couple of months.  Denies any changes in bowel habits, does describe chronically giving himself a laxative on Mondays in order to keep things moving.    Patient lives at home by himself, he has no home health care nurses.    Denies fever, chills, weight loss, abdominal pain or symptoms that awaken him from sleep.  Past Medical History:   Diagnosis Date  . Erectile dysfunction   . Gross hematuria   . History of recurrent UTIs   . History of spinal cord injury    43 (age 2 fell from ladder) w/ cervical spine fx  s/p  c4 -- c6  fusion--  residual paraplegic bilateral legs (great below T8 uses w/c and can transfer self  . Paraparesis of both lower limbs ([place])    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 49 in 32  . Spastic tetraplegia ([place])     Past Surgical History:  Procedure Laterality Date  . CERVICAL FUSION  1988   C4 -- C6 w/ spinal cord injury  . CYSTOSCOPY WITH RETROGRADE PYELOGRAM, URETEROSCOPY AND STENT PLACEMENT Left 12/27/2013   Procedure: CYSTOSCOPY WITH RETROGRADE PYELOGRAM, URETEROSCOPY AND STENT PLACEMENT;  Surgeon: Reece Packer, MD;  Location: [facility];  Service: Urology;  Laterality: Left;  . CYSTOSCOPY/RETROGRADE/URETEROSCOPY Left 01/09/2014   Procedure: CYSTOSCOPY/RETROGRADE/URETEROSCOPY, URETERAL BIOPSY AND STENT EXCHANGE;  Surgeon: Alexis Frock, MD;  [facility];  Service: Urology;  Laterality: Left;  . HIP ARTHROPLASTY  08/12/2011   Procedure: ARTHROPLASTY BIPOLAR HIP;  Surgeon: Mauri Pole, MD;  Location[facility];  Service: Orthopedics;  Laterality: Left;  Hemi-arthroplasty    Current Outpatient Medications  Medication Sig Dispense Refill  . baclofen (LIORESAL) 20 MG tablet  Take 1 tablet (20 mg total) by mouth 4 (four) times daily. 120 tablet 3  . ferrous sulfate 325 (65 FE) MG tablet Take 1 tablet (325 mg total) by mouth daily with breakfast. 90 tablet 3  . Hydrocortisone, Perianal, (PROCTO-PAK) 1 % CREA Apply to rectal area twice daily as needed 28 g 0  . tadalafil (CIALIS) 5 MG tablet Take 5 mg by mouth daily as needed for erectile dysfunction.     No current facility-administered medications for this visit.    Allergies as of 06/14/2020 - Review Complete 06/07/2020  Allergen Reaction Noted  . Penicillins Itching and Rash 05/10/2009    Family History   Problem Relation Age of Onset  . Arthritis Mother   . Hypertension Mother     Social History   Socioeconomic History  . Marital status: Single    Spouse name: Not on file  . Number of children: Not on file  . Years of education: 105  . Highest education level: 12th grade  Occupational History  . Not on file  Tobacco Use  . Smoking status: Former Smoker    Packs/day: 0.25    Years: 0.50    Pack years: 0.12    Types: Cigarettes    Quit date: 12/27/1986    Years since quitting: 33.4  . Smokeless tobacco: Never Used  Vaping Use  . Vaping Use: Never used  Substance and Sexual Activity  . Alcohol use: No  . Drug use: No  . Sexual activity: Yes  Other Topics Concern  . Not on file  Social History Narrative   Lives alone in apartment. Ground level. No pets.1 brother 1 sister. Remains active.  Is on disability secondary to neck fracture. Stays active going to gym and church. Uses [employer] for transportation. No smoke alarms.      Likes to eat chicken, rice, some greens, apples. Likes to drink water, milk, cranberry juice, unsweet tea.      No hobbies, likes to watch TV, goes to gym.    Social Determinants of Health   Financial Resource Strain: Not on file  Food Insecurity: Not on file  Transportation Needs: Not on file  Physical Activity: Not on file  Stress: Not on file  Social Connections: Not on file  Intimate Partner Violence: Not on file    Review of Systems:    Constitutional: No weight loss, fever or chills Skin: No rash  Cardiovascular: No chest pain Respiratory: No SOB  Gastrointestinal: See HPI and otherwise negative Genitourinary: No dysuria  Neurological: No headache, dizziness or syncope Musculoskeletal: No new muscle or joint pain Hematologic: No bleeding Psychiatric: No history of depression or anxiety   Physical Exam:  Vital signs: BP 124/80   Pulse 68  (wheelchair) Constitutional:   Pleasant AA male appears to be in NAD, Well developed, Well  nourished, alert and cooperative Head:  Normocephalic and atraumatic. Eyes:   PEERL, EOMI. No icterus. Conjunctiva pink. Ears:  Normal auditory acuity. Neck:  Supple Throat: Oral cavity and pharynx without inflammation, swelling or lesion.  Respiratory: Respirations even and unlabored. Lungs clear to auscultation bilaterally.   No wheezes, crackles, or rhonchi.  Cardiovascular: Normal S1, S2. No MRG. Regular rate and rhythm. No peripheral edema, cyanosis or pallor.  Gastrointestinal:  Soft, nondistended, nontender. No rebound or guarding. Normal bowel sounds. No appreciable masses or hepatomegaly. Rectal: External: A large amount of external hemorrhoid tissue with some question of anal warts for skin tags as well around the rectal opening,  small 1 cm circumferential sacral decubitus ulcer which is tender to palpation; internal: Tender to palpation; anoscopy: Grade 2 hemorrhoids and at least a 1 inch round lesion with ulceration, tender to palpation Msk:  Symmetrical without gross deformities. +ambulates in wheelchair Neurologic:  Alert and  oriented x4; +paraplegic Skin:   Dry and intact without significant lesions or rashes. Psychiatric:  Demonstrates good judgement and reason without abnormal affect or behaviors.  No recent labs or imaging.  Assessment: 1.  IDA: Since August of last year; consider GI source versus other 2.  Abnormal rectal exam: With question of hemorrhoid tissue versus mass 3.  Hemorrhoids: External and internal on exam  Plan: 1.  Repeat CBC, CMP and iron studies today including ferritin 2.  Discussed with patient that there was some abnormality on rectal exam.  This still could be hemorrhoid tissue, but we need to ensure that it is not cancer.  Answered patient's questions. 3.  Prescribed Hydrocortisone ointment to be applied to external hemorrhoids/hemorrhoid tissue twice daily for the next 2 weeks. 4.  Discussed sacral decubitus ulcer, this is painful to the patient.   Would recommend that he put some barrier ointment i.e. Desitin or A&D on this area and cover with a gauze pad to keep it clean and dry and protected. 5.  Patient was arranged for an EGD and colonoscopy at the hospital given his wheelchair status with Dr. Hilarie Fredrickson.  Did discuss the case with Dr. Hilarie Fredrickson at time of patient's visit.  We scheduled him for 06/26/2020.  Did provide the patient with a detailed list of risks for procedures and he agrees to proceed.  He will be COVID tested prior to time of procedures. 6.  Continue iron for now, may need an iron infusion given that ferritin was only 4 at time of last check in August. 7.  Patient to follow in clinic per recommendations from Dr. Hilarie Fredrickson after time of procedure.  Ellouise Newer, PA-C [facility] 06/14/2020, 1:15 PM  Cc: Alcus Dad, MD

## 2020-06-14 NOTE — Patient Instructions (Addendum)
START:   Apply over the counter Hydrocortisone ointment to rectum twice daily to help with hemorrhoids.   Also use AD ointment OR Desitin on ulcer. Cover with a gauze after applying cream or ointment.   If you are age 50 or older, your body mass index should be between 23-30. Your There is no height or weight on file to calculate BMI. If this is out of the aforementioned range listed, please consider follow up with your Primary Care Provider.  If you are age 64 or younger, your body mass index should be between 19-25. Your There is no height or weight on file to calculate BMI. If this is out of the aformentioned range listed, please consider follow up with your Primary Care Provider.   Your provider has requested that you go to the basement level for lab work before leaving today. Press "B" on the elevator. The lab is located at the first door on the left as you exit the elevator.  You have been scheduled for an endoscopy and colonoscopy. Please follow the written instructions given to you at your visit today. Please pick up your prep supplies at the pharmacy within the next 1-3 days. If you use inhalers (even only as needed), please bring them with you on the day of your procedure.  Thank you for choosing me and [facility].  Ellouise Newer, PA-C

## 2020-06-19 NOTE — Progress Notes (Addendum)
Attempted to obtain medical history via telephone, unable to reach at this time. No voicemail set at this time.

## 2020-06-21 ENCOUNTER — Other Ambulatory Visit (HOSPITAL_COMMUNITY)
Admission: RE | Admit: 2020-06-21 | Discharge: 2020-06-21 | Disposition: A | Discharge status: Home / Self Care | Payer: Medicare HMO | Source: Ambulatory Visit | Attending: Internal Medicine | Admitting: Internal Medicine

## 2020-06-21 DIAGNOSIS — Z20822 Contact with and (suspected) exposure to covid-19: Secondary | ICD-10-CM | POA: Diagnosis not present

## 2020-06-21 DIAGNOSIS — Z01812 Encounter for preprocedural laboratory examination: Secondary | ICD-10-CM | POA: Diagnosis not present

## 2020-06-21 LAB — SARS CORONAVIRUS 2 (TAT 6-24 HRS): SARS Coronavirus 2: NEGATIVE

## 2020-06-26 ENCOUNTER — Ambulatory Visit (HOSPITAL_COMMUNITY): Payer: Medicare HMO | Admitting: Certified Registered"

## 2020-06-26 ENCOUNTER — Other Ambulatory Visit: Payer: Self-pay

## 2020-06-26 ENCOUNTER — Encounter (HOSPITAL_COMMUNITY)
Admission: RE | Disposition: A | Discharge status: Home / Self Care | Payer: Self-pay | Source: Home / Self Care | Attending: Internal Medicine

## 2020-06-26 ENCOUNTER — Ambulatory Visit (HOSPITAL_COMMUNITY)
Admission: RE | Admit: 2020-06-26 | Discharge: 2020-06-26 | Disposition: A | Discharge status: Home / Self Care | Payer: Medicare HMO | Attending: Internal Medicine | Admitting: Internal Medicine

## 2020-06-26 ENCOUNTER — Encounter (HOSPITAL_COMMUNITY): Payer: Self-pay | Admitting: Internal Medicine

## 2020-06-26 DIAGNOSIS — D123 Benign neoplasm of transverse colon: Secondary | ICD-10-CM | POA: Diagnosis not present

## 2020-06-26 DIAGNOSIS — K644 Residual hemorrhoidal skin tags: Secondary | ICD-10-CM | POA: Diagnosis not present

## 2020-06-26 DIAGNOSIS — Z880 Allergy status to penicillin: Secondary | ICD-10-CM | POA: Insufficient documentation

## 2020-06-26 DIAGNOSIS — Z79899 Other long term (current) drug therapy: Secondary | ICD-10-CM | POA: Diagnosis not present

## 2020-06-26 DIAGNOSIS — K6289 Other specified diseases of anus and rectum: Secondary | ICD-10-CM | POA: Insufficient documentation

## 2020-06-26 DIAGNOSIS — K635 Polyp of colon: Secondary | ICD-10-CM

## 2020-06-26 DIAGNOSIS — Z96642 Presence of left artificial hip joint: Secondary | ICD-10-CM | POA: Diagnosis not present

## 2020-06-26 DIAGNOSIS — Z993 Dependence on wheelchair: Secondary | ICD-10-CM | POA: Insufficient documentation

## 2020-06-26 DIAGNOSIS — Z87891 Personal history of nicotine dependence: Secondary | ICD-10-CM | POA: Diagnosis not present

## 2020-06-26 DIAGNOSIS — K2950 Unspecified chronic gastritis without bleeding: Secondary | ICD-10-CM | POA: Diagnosis not present

## 2020-06-26 DIAGNOSIS — K5900 Constipation, unspecified: Secondary | ICD-10-CM

## 2020-06-26 DIAGNOSIS — D509 Iron deficiency anemia, unspecified: Secondary | ICD-10-CM

## 2020-06-26 DIAGNOSIS — K620 Anal polyp: Secondary | ICD-10-CM

## 2020-06-26 DIAGNOSIS — B9681 Helicobacter pylori [H. pylori] as the cause of diseases classified elsewhere: Secondary | ICD-10-CM | POA: Insufficient documentation

## 2020-06-26 DIAGNOSIS — K2900 Acute gastritis without bleeding: Secondary | ICD-10-CM | POA: Insufficient documentation

## 2020-06-26 DIAGNOSIS — K5939 Other megacolon: Secondary | ICD-10-CM

## 2020-06-26 DIAGNOSIS — K648 Other hemorrhoids: Secondary | ICD-10-CM | POA: Diagnosis not present

## 2020-06-26 DIAGNOSIS — G8250 Quadriplegia, unspecified: Secondary | ICD-10-CM | POA: Diagnosis not present

## 2020-06-26 DIAGNOSIS — R6889 Other general symptoms and signs: Secondary | ICD-10-CM

## 2020-06-26 HISTORY — PX: COLONOSCOPY WITH PROPOFOL: SHX5780

## 2020-06-26 HISTORY — PX: ESOPHAGOGASTRODUODENOSCOPY (EGD) WITH PROPOFOL: SHX5813

## 2020-06-26 HISTORY — PX: BIOPSY: SHX5522

## 2020-06-26 HISTORY — PX: POLYPECTOMY: SHX5525

## 2020-06-26 SURGERY — COLONOSCOPY WITH PROPOFOL
Anesthesia: Monitor Anesthesia Care

## 2020-06-26 MED ORDER — LACTATED RINGERS IV SOLN: INTRAVENOUS | Status: DC | PRN

## 2020-06-26 MED ORDER — DEXMEDETOMIDINE (PRECEDEX) IN NS 20 MCG/5ML (4 MCG/ML) IV SYRINGE
PREFILLED_SYRINGE | INTRAVENOUS | Status: DC | PRN
  Administered 2020-06-26 (×5): 4 ug via INTRAVENOUS

## 2020-06-26 MED ORDER — PROPOFOL 500 MG/50ML IV EMUL
INTRAVENOUS | Status: AC
  Filled 2020-06-26: qty 50

## 2020-06-26 MED ORDER — PHENYLEPHRINE 40 MCG/ML (10ML) SYRINGE FOR IV PUSH (FOR BLOOD PRESSURE SUPPORT)
PREFILLED_SYRINGE | INTRAVENOUS | Status: DC | PRN
  Administered 2020-06-26: 40 ug via INTRAVENOUS

## 2020-06-26 MED ORDER — PROPOFOL 500 MG/50ML IV EMUL
INTRAVENOUS | Status: DC | PRN
  Administered 2020-06-26: 150 ug/kg/min via INTRAVENOUS

## 2020-06-26 MED ORDER — LIDOCAINE HCL (CARDIAC) PF 100 MG/5ML IV SOSY
PREFILLED_SYRINGE | INTRAVENOUS | Status: DC | PRN
  Administered 2020-06-26: 60 mg via INTRAVENOUS

## 2020-06-26 MED ORDER — PROPOFOL 10 MG/ML IV BOLUS
INTRAVENOUS | Status: DC | PRN
  Administered 2020-06-26: 30 mg via INTRAVENOUS

## 2020-06-26 MED ORDER — PROPOFOL 10 MG/ML IV BOLUS
INTRAVENOUS | Status: AC
  Filled 2020-06-26: qty 20

## 2020-06-26 MED ORDER — PANTOPRAZOLE SODIUM 40 MG PO TBEC: 40.0000 mg | DELAYED_RELEASE_TABLET | Freq: Every day | ORAL | 1 refills | Status: DC

## 2020-06-26 MED ORDER — DEXMEDETOMIDINE (PRECEDEX) IN NS 20 MCG/5ML (4 MCG/ML) IV SYRINGE
PREFILLED_SYRINGE | INTRAVENOUS | Status: AC
  Filled 2020-06-26: qty 5

## 2020-06-26 MED ORDER — EPHEDRINE SULFATE-NACL 50-0.9 MG/10ML-% IV SOSY
PREFILLED_SYRINGE | INTRAVENOUS | Status: DC | PRN
  Administered 2020-06-26 (×3): 5 mg via INTRAVENOUS

## 2020-06-26 MED ORDER — GLYCOPYRROLATE 0.2 MG/ML IJ SOLN
INTRAMUSCULAR | Status: DC | PRN
  Administered 2020-06-26 (×2): .1 mg via INTRAVENOUS

## 2020-06-26 MED ORDER — SODIUM CHLORIDE 0.9 % IV SOLN: INTRAVENOUS | Status: DC

## 2020-06-26 SURGICAL SUPPLY — 25 items

## 2020-06-26 NOTE — Interval H&P Note (Signed)
History and Physical Interval Note: For EGD and colonoscopy in the outpatient hospital setting to eval IDA and abnormal rectal/perianal examination The nature of the procedure, as well as the risks, benefits, and alternatives were carefully and thoroughly reviewed with the patient. Ample time for discussion and questions allowed. The patient understood, was satisfied, and agreed to proceed.   06/26/2020 11:16 AM  Andrew Sullivan  has presented today for surgery, with the diagnosis of IDA, Rectal mass.  The various methods of treatment have been discussed with the patient and family. After consideration of risks, benefits and other options for treatment, the patient has consented to  Procedure(s): COLONOSCOPY WITH PROPOFOL (N/A) ESOPHAGOGASTRODUODENOSCOPY (EGD) WITH PROPOFOL (N/A) as a surgical intervention.  The patient's history has been reviewed, patient examined, no change in status, stable for surgery.  I have reviewed the patient's chart and labs.  Questions were answered to the patient's satisfaction.     Lajuan Lines Teaira Croft

## 2020-06-26 NOTE — Op Note (Signed)
[facility] Patient Name: Andrew Sullivan Procedure Date: 06/26/2020 MRN: [id number] Attending MD: Jerene Bears , MD Date of Birth: [birth year] CSN: [id number] Age: 50 Admit Type: Outpatient Procedure:                Colonoscopy Indications:              Iron deficiency anemia, Abnormal rectal exam Providers:                Lajuan Lines. Hilarie Fredrickson, MD, Glori Bickers, RN, Elspeth Cho                            Tech., Technician Referring MD:             Alcus Dad Medicines:                Monitored Anesthesia Care Complications:            No immediate complications. Estimated Blood Loss:     Estimated blood loss was minimal. Procedure:                Pre-Anesthesia Assessment:                           - Prior to the procedure, a History and Physical                            was performed, and patient medications and                            allergies were reviewed. The patient's tolerance of                            previous anesthesia was also reviewed. The risks                            and benefits of the procedure and the sedation                            options and risks were discussed with the patient.                            All questions were answered, and informed consent                            was obtained. Prior Anticoagulants: The patient has                            taken no previous anticoagulant or antiplatelet                            agents. ASA Grade Assessment: III - A patient with                            severe systemic disease. After reviewing the risks  and benefits, the patient was deemed in                            satisfactory condition to undergo the procedure.                           After obtaining informed consent, the colonoscope                            was passed under direct vision. Throughout the                            procedure, the patient's blood pressure, pulse, and                             oxygen saturations were monitored continuously. The                            CF-HQ190L ([id number]) Olympus colonoscope was                            introduced through the anus and advanced to the                            cecum, identified by appendiceal orifice and                            ileocecal valve. The colonoscopy was performed                            without difficulty. The patient tolerated the                            procedure well. The quality of the bowel                            preparation was adequate. The ileocecal valve,                            appendiceal orifice, and rectum were photographed. Scope In: 11:49:42 AM Scope Out: 12:23:30 PM Scope Withdrawal Time: 0 hours 20 minutes 43 seconds  Total Procedure Duration: 0 hours 33 minutes 48 seconds  Findings:      The digital rectal exam findings include hemorrhoids, skin tags,       polypoid anal mucosa and palpable irregularity in the very distal       rectum/proximal anal canal.      The lumen of the colon (entire examined portion) was moderately dilated.      A 6 mm polyp was found in the transverse colon. The polyp was sessile.       The polyp was removed with a cold snare. Resection and retrieval were       complete.      Internal hemorrhoids were found during retroflexion. The hemorrhoids       were medium-sized.      An area of nodular and polypoid  mucosa was found in the distal rectum       and at the dentate line/anal canal. No definitive tumor was seen, though       the mucosa is irregular. Multiple biopsies were taken with a cold       forceps for histology at the dentate line to exclude AIN and dysplasia. Impression:               - Abnormal DRE as described above.                           - Diffuse colonic dilation which is very likely                            chronic.                           - One 6 mm polyp in the transverse colon, removed                             with a cold snare. Resected and retrieved.                           - Abnormal mucosa at the dentate line/anal canal                            with nodularity, polypoid mucosa and hemorrhoids.                            Biopsied. Moderate Sedation:      N/A Recommendation:           - Patient has a contact number available for                            emergencies. The signs and symptoms of potential                            delayed complications were discussed with the                            patient. Return to normal activities tomorrow.                            Written discharge instructions were provided to the                            patient.                           - Resume previous diet.                           - Continue present medications.                           - Await pathology results.                           -  Referral to see colorectal surgery is appropriate                            pending pathology results.                           - Repeat colonoscopy is recommended. The                            colonoscopy date will be determined after pathology                            results from today's exam become available for                            review. Procedure Code(s):        --- Professional ---                           [phone], Colonoscopy, flexible; with removal of                            tumor(s), polyp(s), or other lesion(s) by snare                            technique                           45380, 42, Colonoscopy, flexible; with biopsy,                            single or multiple Diagnosis Code(s):        --- Professional ---                           K64.8, Other hemorrhoids                           K59.39, Other megacolon                           K63.5, Polyp of colon                           K62.89, Other specified diseases of anus and rectum                           D50.9, Iron deficiency anemia, unspecified CPT  copyright 2019 American Medical Association. All rights reserved. The codes documented in this report are preliminary and upon coder review may  be revised to meet current compliance requirements. Jerene Bears, MD 06/26/2020 12:40:29 PM This report has been signed electronically. Number of Addenda: 0

## 2020-06-26 NOTE — Anesthesia Postprocedure Evaluation (Signed)
Anesthesia Post Note  Patient: Andrew Sullivan  Procedure(s) Performed: COLONOSCOPY WITH PROPOFOL (N/A ) ESOPHAGOGASTRODUODENOSCOPY (EGD) WITH PROPOFOL (N/A ) BIOPSY POLYPECTOMY     Patient location during evaluation: Endoscopy Anesthesia Type: MAC Level of consciousness: awake and alert Pain management: pain level controlled Vital Signs Assessment: post-procedure vital signs reviewed and stable Respiratory status: spontaneous breathing, nonlabored ventilation and respiratory function stable Cardiovascular status: blood pressure returned to baseline and stable Postop Assessment: no apparent nausea or vomiting Anesthetic complications: no   No complications documented.  Last Vitals:  Vitals:   06/26/20 1230 06/26/20 1240  BP: (!) 123/44 (!) 135/49  Pulse: (!) 47 (!) 51  Resp: (!) 26 (!) 21  Temp: (!) 36.3 C   SpO2: 100% 100%    Last Pain:  Vitals:   06/26/20 1240  TempSrc:   PainSc: 0-No pain                 Lidia Collum

## 2020-06-26 NOTE — Transfer of Care (Signed)
Immediate Anesthesia Transfer of Care Note  Patient: Andrew Sullivan  Procedure(s) Performed: COLONOSCOPY WITH PROPOFOL (N/A ) ESOPHAGOGASTRODUODENOSCOPY (EGD) WITH PROPOFOL (N/A ) BIOPSY POLYPECTOMY  Patient Location: PACU  Anesthesia Type:General  Level of Consciousness: drowsy and patient cooperative  Airway & Oxygen Therapy: Patient Spontanous Breathing and Patient connected to face mask oxygen  Post-op Assessment: Report given to RN and Post -op Vital signs reviewed and stable  Post vital signs: Reviewed and stable  Last Vitals:  Vitals Value Taken Time  BP 123/44 06/26/20 1230  Temp    Pulse 48 06/26/20 1234  Resp 17 06/26/20 1234  SpO2 100 % 06/26/20 1234  Vitals shown include unvalidated device data.  Last Pain:  Vitals:   06/26/20 1059  TempSrc: Oral  PainSc: 0-No pain         Complications: No complications documented.

## 2020-06-26 NOTE — Anesthesia Preprocedure Evaluation (Signed)
Anesthesia Evaluation  Patient identified by MRN, date of birth, ID band Patient awake    Reviewed: Allergy & Precautions, NPO status , Patient's Chart, lab work & pertinent test results  History of Anesthesia Complications Negative for: history of anesthetic complications  Airway Mallampati: II  TM Distance: >3 FB Neck ROM: Full    Dental  (+) Teeth Intact   Pulmonary neg pulmonary ROS, former smoker,    Pulmonary exam normal        Cardiovascular + DVT  Normal cardiovascular exam     Neuro/Psych Paraplegia 2/2 spinal cord injury in 1988    GI/Hepatic negative GI ROS, Neg liver ROS,   Endo/Other  negative endocrine ROS  Renal/GU negative Renal ROS  negative genitourinary   Musculoskeletal negative musculoskeletal ROS (+)   Abdominal   Peds  Hematology  (+) anemia ,   Anesthesia Other Findings   Reproductive/Obstetrics                             Anesthesia Physical Anesthesia Plan  ASA: III  Anesthesia Plan: MAC   Post-op Pain Management:    Induction: Intravenous  PONV Risk Score and Plan: 1 and Propofol infusion, TIVA and Treatment may vary due to age or medical condition  Airway Management Planned: Natural Airway, Nasal Cannula and Simple Face Mask  Additional Equipment: None  Intra-op Plan:   Post-operative Plan:   Informed Consent: I have reviewed the patients History and Physical, chart, labs and discussed the procedure including the risks, benefits and alternatives for the proposed anesthesia with the patient or authorized representative who has indicated his/her understanding and acceptance.       Plan Discussed with:   Anesthesia Plan Comments:         Anesthesia Quick Evaluation

## 2020-06-26 NOTE — Op Note (Signed)
[facility] Patient Name: Andrew Sullivan Procedure Date: 06/26/2020 MRN: [id number] Attending MD: Jerene Bears , MD Date of Birth: 1970-03-26 CSN: [id number] Age: 50 Admit Type: Outpatient Procedure:                Upper GI endoscopy Indications:              Iron deficiency anemia Providers:                Lajuan Lines. Hilarie Fredrickson, MD, Glori Bickers, RN, Cletis [place],                            Technician Referring MD:             Alcus Dad Medicines:                Monitored Anesthesia Care Complications:            No immediate complications. Estimated Blood Loss:     Estimated blood loss was minimal. Procedure:                Pre-Anesthesia Assessment:                           - Prior to the procedure, a History and Physical                            was performed, and patient medications and                            allergies were reviewed. The patient's tolerance of                            previous anesthesia was also reviewed. The risks                            and benefits of the procedure and the sedation                            options and risks were discussed with the patient.                            All questions were answered, and informed consent                            was obtained. Prior Anticoagulants: The patient has                            taken no previous anticoagulant or antiplatelet                            agents. ASA Grade Assessment: III - A patient with                            severe systemic disease. After reviewing the risks  and benefits, the patient was deemed in                            satisfactory condition to undergo the procedure.                           After obtaining informed consent, the endoscope was                            passed under direct vision. Throughout the                            procedure, the patient's blood pressure, pulse, and                            oxygen  saturations were monitored continuously. The                            GIF-H190 ([id number]) Olympus gastroscope was                            introduced through the mouth, and advanced to the                            second part of duodenum. The upper GI endoscopy was                            accomplished without difficulty. The patient                            tolerated the procedure well. Scope In: Scope Out: Findings:      The examined esophagus was normal.      Patchy moderate inflammation characterized by adherent blood, erythema       and shallow ulcerations was found in the gastric body and in the gastric       antrum. Biopsies were taken with a cold forceps for histology and       Helicobacter pylori testing.      The examined duodenum was normal. Biopsies for histology were taken with       a cold forceps for evaluation of celiac disease. Impression:               - Normal esophagus.                           - Acute gastritis with shallow, scattered,                            ulcerations. Biopsied.                           - Normal examined duodenum. Moderate Sedation:      N/A Recommendation:           - Patient has a contact number available for  emergencies. The signs and symptoms of potential                            delayed complications were discussed with the                            patient. Return to normal activities tomorrow.                            Written discharge instructions were provided to the                            patient.                           - Resume previous diet.                           - Continue present medications.                           - Begin pantoprazole 40 mg once daily to aid in                            healing of gastritis and small ulcers.                           - Await pathology results. Follow-up H. Pylori                            status and treat if positive.                            - See the other procedure note for documentation of                            additional recommendations. Procedure Code(s):        --- Professional ---                           [phone], Esophagogastroduodenoscopy, flexible,                            transoral; with biopsy, single or multiple Diagnosis Code(s):        --- Professional ---                           K29.00, Acute gastritis without bleeding                           D50.9, Iron deficiency anemia, unspecified CPT copyright 2019 American Medical Association. All rights reserved. The codes documented in this report are preliminary and upon coder review may  be revised to meet current compliance requirements. Jerene Bears, MD 06/26/2020 12:32:21 PM This report has been signed electronically. Number of Addenda: 0

## 2020-06-26 NOTE — Discharge Instructions (Signed)
YOU HAD AN ENDOSCOPIC PROCEDURE TODAY: Refer to the procedure report and other information in the discharge instructions given to you for any specific questions about what was found during the examination. If this information does not answer your questions, please call [place] office at [phone] to clarify.  ° °YOU SHOULD EXPECT: Some feelings of bloating in the abdomen. Passage of more gas than usual. Walking can help get rid of the air that was put into your GI tract during the procedure and reduce the bloating. If you had a lower endoscopy (such as a colonoscopy or flexible sigmoidoscopy) you may notice spotting of blood in your stool or on the toilet paper. Some abdominal soreness may be present for a day or two, also. ° °DIET: Your first meal following the procedure should be a light meal and then it is ok to progress to your normal diet. A half-sandwich or bowl of soup is an example of a good first meal. Heavy or fried foods are harder to digest and may make you feel nauseous or bloated. Drink plenty of fluids but you should avoid alcoholic beverages for 24 hours. If you had a esophageal dilation, please see attached instructions for diet.   ° °ACTIVITY: Your care partner should take you home directly after the procedure. You should plan to take it easy, moving slowly for the rest of the day. You can resume normal activity the day after the procedure however YOU SHOULD NOT DRIVE, use power tools, machinery or perform tasks that involve climbing or major physical exertion for 24 hours (because of the sedation medicines used during the test).  ° °SYMPTOMS TO REPORT IMMEDIATELY: °A gastroenterologist can be reached at any hour. Please call [phone]  for any of the following symptoms:  °Following lower endoscopy (colonoscopy, flexible sigmoidoscopy) °Excessive amounts of blood in the stool  °Significant tenderness, worsening of abdominal pains  °Swelling of the abdomen that is new, acute  °Fever of 100° or  higher  °Following upper endoscopy (EGD, EUS, ERCP, esophageal dilation) °Vomiting of blood or coffee ground material  °New, significant abdominal pain  °New, significant chest pain or pain under the shoulder blades  °Painful or persistently difficult swallowing  °New shortness of breath  °Black, tarry-looking or red, bloody stools ° °FOLLOW UP:  °If any biopsies were taken you will be contacted by phone or by letter within the next 1-3 weeks. Call [phone]  if you have not heard about the biopsies in 3 weeks.  °Please also call with any specific questions about appointments or follow up tests. ° °

## 2020-06-26 NOTE — Progress Notes (Signed)
Addendum: Reviewed and agree with assessment and management plan. Tad Fancher M, MD  

## 2020-06-27 LAB — SURGICAL PATHOLOGY

## 2020-06-28 ENCOUNTER — Telehealth: Payer: Self-pay | Admitting: *Deleted

## 2020-06-28 ENCOUNTER — Encounter (HOSPITAL_COMMUNITY): Payer: Self-pay | Admitting: Internal Medicine

## 2020-06-28 DIAGNOSIS — A048 Other specified bacterial intestinal infections: Secondary | ICD-10-CM

## 2020-06-28 NOTE — Telephone Encounter (Signed)
I have attempted to reach patient but got no answer and voicemail box was not set up. I will attempt to reach him at a later time. We will sent Pylera breakdown (due to cost) and have patient take pantoprazole BID x 14 days then D/C thereafter. In addition, we will have him to get H Pylori stool testing around 09/06/20. Order have been placed in [place] for 09/06/20 as well as for Pylera breakdown medications. Surgical referral has also been placed to [facility].

## 2020-06-28 NOTE — Telephone Encounter (Signed)
-----   Message from Jerene Bears, MD sent at 06/28/2020  8:49 AM EDT ----- Carla Drape Can you please call the patient with the following: 1. He has H pylori gastritis and this explains the ulcers.  I started him on panto 40 once daily but this needs to increase to BID while on abx treatment.  PPI can stop after treatment.  Pylera (or the components) or Talicia.  I believe Tacilia if used has PPI already in it (and he would not need 2 PPIs). 2. One precancerous colon polyp -- removed.  Recall colon 7 years 3. Surgical referral to Dr. Dema Severin or Marcello Moores for symptomatic hemorrhoidal disease not amenable to banding 4. Check H pylori stool ag in about 8 weeks once abx and PPI done  Once notified by phone, no path letter needed Thanks JMP

## 2020-06-29 NOTE — Telephone Encounter (Signed)
Attempted to reach patient but no answer and no voicemail. Will attempt to reach him at a later time.

## 2020-07-02 ENCOUNTER — Encounter: Payer: Self-pay | Admitting: *Deleted

## 2020-07-02 NOTE — Telephone Encounter (Signed)
I have attempted to reach this patient by phone x 3 with no answer. Voicemail not set up. I will send him both a mychart message and letter in the mail asking him to call back so I can go over information with him.

## 2020-08-10 DIAGNOSIS — G8250 Quadriplegia, unspecified: Secondary | ICD-10-CM | POA: Diagnosis not present

## 2020-08-10 DIAGNOSIS — R532 Functional quadriplegia: Secondary | ICD-10-CM | POA: Diagnosis not present

## 2020-08-10 DIAGNOSIS — M62830 Muscle spasm of back: Secondary | ICD-10-CM | POA: Diagnosis not present

## 2020-08-10 DIAGNOSIS — R609 Edema, unspecified: Secondary | ICD-10-CM | POA: Diagnosis not present

## 2020-09-24 ENCOUNTER — Encounter: Payer: Self-pay | Admitting: Family Medicine

## 2020-09-24 ENCOUNTER — Other Ambulatory Visit: Payer: Self-pay

## 2020-09-24 ENCOUNTER — Ambulatory Visit (INDEPENDENT_AMBULATORY_CARE_PROVIDER_SITE_OTHER): Payer: Medicare HMO | Admitting: Family Medicine

## 2020-09-24 DIAGNOSIS — R532 Functional quadriplegia: Secondary | ICD-10-CM

## 2020-09-24 DIAGNOSIS — H579 Unspecified disorder of eye and adnexa: Secondary | ICD-10-CM

## 2020-09-24 DIAGNOSIS — K6289 Other specified diseases of anus and rectum: Secondary | ICD-10-CM | POA: Diagnosis not present

## 2020-09-24 DIAGNOSIS — G8250 Quadriplegia, unspecified: Secondary | ICD-10-CM

## 2020-09-24 MED ORDER — BACLOFEN 20 MG PO TABS: 20.0000 mg | ORAL_TABLET | Freq: Four times a day (QID) | ORAL | 3 refills | Status: DC

## 2020-09-24 NOTE — Progress Notes (Signed)
    SUBJECTIVE:   CHIEF COMPLAINT / HPI:   Needs Hospital Bed Patient requesting new hospital bed. He has one currently, but it's >50 years old and very worn out. Patient requires a hospital bed due to spastic paraplegia s/p ladder accident in 1988. He is predominately bed/wheelchair bound at baseline. He would like to get it from [employer], if possible. This is the company that supplied his wheelchair which he's very pleased with.  Bilateral Leg Pain/Stiffness Reports worsening pain and stiffness in bilateral hamstrings over the past month. States he's done physical therapy many many times in the past for this and it's very helpful. Would like to restart physical therapy.  Vision Problems Patient reports worsening vision over the past several months. Right eye worse than his left. Finds himself squinting to see both near and far. 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 No floaters, eye pain, or eye injury. Would like to see Dr. Katy Fitch if possible.  PERTINENT  PMH / PSH: spastic quadriplegia, iron deficiency anemia, DVT, recurrent UTIs  OBJECTIVE:   BP 124/68   Pulse 67   Ht 6\' 3"  (1.905 m)   Wt 168 lb (76.2 kg)   SpO2 97%   BMI 21.00 kg/m    Vision Screening   Right eye Left eye Both eyes  Without correction 20/100 20/40 20/30  With correction      General: NAD, pleasant, able to participate in exam Cardiac: RRR, S1 S2 present. normal heart sounds, no murmurs. Respiratory: CTAB, normal effort, No wheezes, rales or rhonchi Abdomen: Bowel sounds present, non-tender, non-distended Extremities: no edema or cyanosis. Skin: warm and dry, no rashes noted Neuro: alert, gross sensation intact throughout, 0/5 strength in L lower extremity, 2/5 strength in right lower extremity Psych: Normal affect and mood   ASSESSMENT/PLAN:   Spastic tetraplegia (HCC) -Refills provided for Baclofen today -Will place DME order for new hospital bed.  Patient requires hospital bed as he is predominately bed/wheelchair bound at baseline due to bilateral leg paraplegia.  -Referral to physical therapy  Abnormal vision screen 20/100 in right eye today, 20/40 on left. Would expect some component of presbyopia at his age, although it appears he's having difficulty with distance vision, particularly on right. Would recommend formal eye exam. -Ophtho referral placed -Patient to call Dr. Katy Fitch, as he may be able to schedule an appointment without referral  Perianal mass Patient referred to GI in April 2022 due to perianal mass, concerning for hemorrhoids vs colorectal cancer. Per chart review, he never answered the phone when GI called with results/follow-up recommendations. It appears they advised surgical removal of hemorrhoids. Colonoscopy negative for cancerous findings. -Patient will call GI office to discuss appropriate next steps     Alcus Dad, MD [place]

## 2020-09-24 NOTE — Patient Instructions (Addendum)
It was great to see you!  - Our office will complete the necessary paperwork for a new hospital bed. Let us know if you haven't heard anything in 2-3 weeks. - I have placed a referral for physical therapy. Someone should call you within the next 2 weeks to schedule. - It is very important to follow up with the GI doctor for your hemorrhoids. Please call them at [phone]. You should also call the surgeons at [phone] to schedule a consultation for removal. - You should be able to call the eye doctor for an eye exam. The phone number for Dr. Katy Fitch is [phone]. I have also placed a referral just in case.  Take care and seek immediate care sooner if you develop any concerns.  Dr. [facility]

## 2020-09-25 DIAGNOSIS — H579 Unspecified disorder of eye and adnexa: Secondary | ICD-10-CM | POA: Insufficient documentation

## 2020-09-25 NOTE — Assessment & Plan Note (Signed)
-  Refills provided for Baclofen today -Will place DME order for new hospital bed. Patient requires hospital bed as he is predominately bed/wheelchair bound at baseline due to bilateral leg paraplegia.  -Referral to physical therapy

## 2020-09-25 NOTE — Assessment & Plan Note (Signed)
20/100 in right eye today, 20/40 on left. Would expect some component of presbyopia at his age, although it appears he's having difficulty with distance vision, particularly on right. Would recommend formal eye exam. -Ophtho referral placed -Patient to call Dr. Katy Fitch, as he may be able to schedule an appointment without referral

## 2020-09-25 NOTE — Assessment & Plan Note (Signed)
Patient referred to GI in April 2022 due to perianal mass, concerning for hemorrhoids vs colorectal cancer. Per chart review, he never answered the phone when GI called with results/follow-up recommendations. It appears they advised surgical removal of hemorrhoids. Colonoscopy negative for cancerous findings. -Patient will call GI office to discuss appropriate next steps

## 2020-10-22 ENCOUNTER — Emergency Department (HOSPITAL_BASED_OUTPATIENT_CLINIC_OR_DEPARTMENT_OTHER): Payer: Medicare HMO

## 2020-10-22 ENCOUNTER — Emergency Department (HOSPITAL_COMMUNITY)
Admission: EM | Admit: 2020-10-22 | Discharge: 2020-10-22 | Disposition: A | Discharge status: Home / Self Care | Payer: Medicare HMO | Attending: Emergency Medicine | Admitting: Emergency Medicine

## 2020-10-22 ENCOUNTER — Other Ambulatory Visit: Payer: Self-pay

## 2020-10-22 DIAGNOSIS — Z7901 Long term (current) use of anticoagulants: Secondary | ICD-10-CM | POA: Diagnosis not present

## 2020-10-22 DIAGNOSIS — Z96642 Presence of left artificial hip joint: Secondary | ICD-10-CM | POA: Diagnosis not present

## 2020-10-22 DIAGNOSIS — R609 Edema, unspecified: Secondary | ICD-10-CM

## 2020-10-22 DIAGNOSIS — Z87891 Personal history of nicotine dependence: Secondary | ICD-10-CM | POA: Insufficient documentation

## 2020-10-22 DIAGNOSIS — Z85038 Personal history of other malignant neoplasm of large intestine: Secondary | ICD-10-CM | POA: Insufficient documentation

## 2020-10-22 DIAGNOSIS — R079 Chest pain, unspecified: Secondary | ICD-10-CM | POA: Diagnosis not present

## 2020-10-22 DIAGNOSIS — I82401 Acute embolism and thrombosis of unspecified deep veins of right lower extremity: Secondary | ICD-10-CM | POA: Diagnosis not present

## 2020-10-22 DIAGNOSIS — I82491 Acute embolism and thrombosis of other specified deep vein of right lower extremity: Secondary | ICD-10-CM

## 2020-10-22 DIAGNOSIS — R2241 Localized swelling, mass and lump, right lower limb: Secondary | ICD-10-CM | POA: Diagnosis present

## 2020-10-22 LAB — CBC WITH DIFFERENTIAL/PLATELET
Abs Immature Granulocytes: 0.01 10*3/uL (ref 0.00–0.07)
Basophils Absolute: 0 10*3/uL (ref 0.0–0.1)
Basophils Relative: 0 %
Eosinophils Absolute: 0.2 10*3/uL (ref 0.0–0.5)
Eosinophils Relative: 4 %
HCT: 44.3 % (ref 39.0–52.0)
Hemoglobin: 14.4 g/dL (ref 13.0–17.0)
Immature Granulocytes: 0 %
Lymphocytes Relative: 25 %
Lymphs Abs: 0.9 10*3/uL (ref 0.7–4.0)
MCH: 30.9 pg (ref 26.0–34.0)
MCHC: 32.5 g/dL (ref 30.0–36.0)
MCV: 95.1 fL (ref 80.0–100.0)
Monocytes Absolute: 0.4 10*3/uL (ref 0.1–1.0)
Monocytes Relative: 12 %
Neutro Abs: 2.1 10*3/uL (ref 1.7–7.7)
Neutrophils Relative %: 59 %
Platelets: 186 10*3/uL (ref 150–400)
RBC: 4.66 MIL/uL (ref 4.22–5.81)
RDW: 12.7 % (ref 11.5–15.5)
WBC: 3.6 10*3/uL — ABNORMAL LOW (ref 4.0–10.5)
nRBC: 0 % (ref 0.0–0.2)

## 2020-10-22 LAB — BASIC METABOLIC PANEL
Anion gap: 7 (ref 5–15)
BUN: 18 mg/dL (ref 6–20)
CO2: 28 mmol/L (ref 22–32)
Calcium: 9 mg/dL (ref 8.9–10.3)
Chloride: 106 mmol/L (ref 98–111)
Creatinine, Ser: 1.01 mg/dL (ref 0.61–1.24)
GFR, Estimated: >60 mL/min (ref >60)
Glucose, Bld: 94 mg/dL (ref 70–99)
Potassium: 4.4 mmol/L (ref 3.5–5.1)
Sodium: 141 mmol/L (ref 135–145)

## 2020-10-22 MED ORDER — APIXABAN 5 MG PO TABS: ORAL_TABLET | ORAL | 0 refills | Status: DC

## 2020-10-22 NOTE — Progress Notes (Signed)
VASCULAR LAB    Right lower extremity venous duplex has been performed.  See CV proc for preliminary results.  Gave verbal report to [employer], PA-C  Nayelly Laughman, RVT 10/22/2020, 3:38 PM

## 2020-10-22 NOTE — ED Provider Notes (Cosign Needed)
Emergency Medicine Provider Triage Evaluation Note  Andrew Sullivan , a 50 y.o. male  was evaluated in triage.  Pt complains of right leg pain/swelling.  Review of Systems  Positive: Right leg pain/swelling Negative: Chest pain, sob  Physical Exam  There were no vitals taken for this visit. Gen:   Awake, no distress   Resp:  Normal effort  MSK:   Moves extremities without difficulty  Other:  No calf ttp  Medical Decision Making  Medically screening exam initiated at 2:41 PM.  Appropriate orders placed.  Andrew Sullivan was informed that the remainder of the evaluation will be completed by another provider, this initial triage assessment does not replace that evaluation, and the importance of remaining in the ED until their evaluation is complete.     Rodney Booze, PA-C 10/22/20 1441

## 2020-10-22 NOTE — ED Provider Notes (Signed)
I provided a substantive portion of the care of this patient.  I personally performed the entirety of the medical decision making for this encounter.  EKG Interpretation  Date/Time:  Monday October 22 2020 20:47:46 EDT Ventricular Rate:  49 PR Interval:  124 QRS Duration: 100 QT Interval:  404 QTC Calculation: 364 R Axis:   94 Text Interpretation: Sinus bradycardia with occasional Premature ventricular complexes Rightward axis Early repolarization Borderline ECG No significant change since last tracing Confirmed by Lacretia Leigh ([id number]) on 10/22/2020 8:55:56 PM   Patient here with right leg discomfort and was diagnosed with DVT on Doppler here.  He developed pain in his chest.  Some shortness of breath.  Patient requires CT of chest for PE which he has refused at this time.  He understands the risk of death from PE and accepts this.  Patient will be placed on Eliquis and he was strongly encouraged to return.  he will be signing out AMA   Lacretia Leigh, MD 10/22/20 2121

## 2020-10-22 NOTE — ED Notes (Addendum)
Pt reports new onset right sided chest pain, non-radiating, 4/10. EKG being obtained, MD Zenia Resides notified. Pt reports he wishes to be discharged and return tomorrow for work up so he will be able to catch the bus home.

## 2020-10-22 NOTE — ED Notes (Addendum)
Pt reaffirms want to leave against medical advice and was education on risks including death. Pt declined signing [place] discharge form.

## 2020-10-22 NOTE — Discharge Instructions (Addendum)
You've been prescribed Eliquis starter pack for the blood clot in your leg. Fill this prescription and start taking immediately. You should follow up with your primary care provider to manage this. If you continue to have chest pain please return to the ED.

## 2020-10-22 NOTE — ED Provider Notes (Signed)
[facility] EMERGENCY DEPARTMENT Provider Note   CSN: [id number] Arrival date & time: 10/22/20  1416     History Chief Complaint  Patient presents with   Leg Swelling    Andrew Sullivan is a 50 y.o. male.  Andrew Sullivan is a 50 y/o male with PMH of fuctional quadriplegia and prior DVT who presents with right leg discomfort and swelling x 2 days. His prior DVT was 2 years ago and was prescribed Xarelto. He states his symptoms feel similarly to then. Patient initially denied chest pain or shortness of breath, but after LE ultrasound complained of left sided chest pain that feels like pressure and with associated shortness of breath. Denies fever, chills, nausea, vomiting, abdominal pain, recent illness, or changes in medications.   The history is provided by the patient.  Leg Pain Location:  Leg Time since incident:  2 days Injury: no   Leg location:  R lower leg Pain details:    Quality:  Aching   Radiates to:  Does not radiate   Severity:  Moderate   Duration:  2 days   Timing:  Constant   Progression:  Unchanged Ineffective treatments:  None tried Associated symptoms: swelling   Associated symptoms: no fever       Past Medical History:  Diagnosis Date   Anemia    Erectile dysfunction    Gross hematuria    History of recurrent UTIs    History of spinal cord injury    44 (age 28 fell from ladder) w/ cervical spine fx  s/p  c4 -- c6  fusion--  residual paraplegic bilateral legs (great below T8 uses w/c and can transfer self   Paraparesis of both lower limbs (HCC)    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 62 in 26   Spastic tetraplegia [facility])     Patient Active Problem List   Diagnosis Date Noted   Abnormal vision screen 09/25/2020   Abnormal digital rectal exam    Benign neoplasm of transverse colon    Anal polyp    Acute gastritis without hemorrhage    Perianal mass 06/07/2020   Iron deficiency anemia 06/07/2020    Symptomatic anemia 05/13/2019   DVT (deep venous thrombosis) ([place]) 10/19/2018   Erectile dysfunction    Spastic tetraplegia ([place]) 12/06/2013   History of recurrent UTIs 05/19/2010   Functional quadriplegia ([place]) 05/10/2009    Past Surgical History:  Procedure Laterality Date   BIOPSY  06/26/2020   Procedure: BIOPSY;  Surgeon: Jerene Bears, MD;  Location: [facility];  Service: Gastroenterology;;  EGD and COLON   CERVICAL FUSION  1988   C4 -- C6 w/ spinal cord injury   COLONOSCOPY WITH PROPOFOL N/A 06/26/2020   Procedure: COLONOSCOPY WITH PROPOFOL;  Surgeon: Jerene Bears, MD;  Location: [facility];  Service: Gastroenterology;  Laterality: N/A;   CYSTOSCOPY WITH RETROGRADE PYELOGRAM, URETEROSCOPY AND STENT PLACEMENT Left 12/27/2013   Procedure: CYSTOSCOPY WITH RETROGRADE PYELOGRAM, URETEROSCOPY AND STENT PLACEMENT;  Surgeon: Reece Packer, MD;  Location: [facility];  Service: Urology;  Laterality: Left;   CYSTOSCOPY/RETROGRADE/URETEROSCOPY Left 01/09/2014   Procedure: CYSTOSCOPY/RETROGRADE/URETEROSCOPY, URETERAL BIOPSY AND STENT EXCHANGE;  Surgeon: Alexis Frock, MD;  Location: [facility];  Service: Urology;  Laterality: Left;   ESOPHAGOGASTRODUODENOSCOPY (EGD) WITH PROPOFOL N/A 06/26/2020   Procedure: ESOPHAGOGASTRODUODENOSCOPY (EGD) WITH PROPOFOL;  Surgeon: Jerene Bears, MD;  Location: [facility];  Service: Gastroenterology;  Laterality: N/A;   HIP ARTHROPLASTY  08/12/2011  Procedure: ARTHROPLASTY BIPOLAR HIP;  Surgeon: Mauri Pole, MD;  Location: [facility];  Service: Orthopedics;  Laterality: Left;  Hemi-arthroplasty   POLYPECTOMY  06/26/2020   Procedure: POLYPECTOMY;  Surgeon: Jerene Bears, MD;  Location: [facility];  Service: Gastroenterology;;       Family History  Problem Relation Age of Onset   Arthritis Mother    Hypertension Mother    Colon cancer Neg Hx    Esophageal cancer Neg Hx    Pancreatic cancer Neg Hx    Stomach cancer Neg Hx    Liver  disease Neg Hx     Social History   Tobacco Use   Smoking status: Former    Packs/day: 0.25    Years: 0.50    Pack years: 0.13    Types: Cigarettes    Quit date: 12/27/1986    Years since quitting: 33.8   Smokeless tobacco: Never  Vaping Use   Vaping Use: Never used  Substance Use Topics   Alcohol use: No   Drug use: No    Home Medications Prior to Admission medications   Medication Sig Start Date End Date Taking? Authorizing Provider  apixaban (ELIQUIS) 5 MG TABS tablet Take 2 tablets ('10mg'$ ) twice daily for 7 days, then 1 tablet ('5mg'$ ) twice daily 10/22/20   Mahika Vanvoorhis T, PA-C  baclofen (LIORESAL) 20 MG tablet Take 1 tablet (20 mg total) by mouth 4 (four) times daily. 09/24/20   Alcus Dad, MD  ferrous sulfate 325 (65 FE) MG tablet Take 1 tablet (325 mg total) by mouth daily with breakfast. 06/07/20   Alcus Dad, MD  Hydrocortisone, Perianal, (PROCTO-PAK) 1 % CREA Apply to rectal area twice daily as needed 06/07/20   Alcus Dad, MD  pantoprazole (PROTONIX) 40 MG tablet Take 1 tablet (40 mg total) by mouth daily before breakfast. Patient not taking: Reported on 09/24/2020 06/26/20 06/26/21  Jerene Bears, MD  tadalafil (CIALIS) 5 MG tablet Take 5 mg by mouth daily as needed for erectile dysfunction. 09/13/19   [provider]    Allergies    Penicillins  Review of Systems   Review of Systems  Constitutional:  Negative for chills and fever.  HENT:  Negative for congestion.   Respiratory:  Positive for shortness of breath. Negative for cough.   Cardiovascular:  Positive for chest pain.  Gastrointestinal:  Negative for abdominal pain, nausea and vomiting.  Musculoskeletal:  Positive for myalgias.       Right lower leg pain and swelling  Skin:  Negative for color change.  Neurological:  Negative for light-headedness and headaches.  All other systems reviewed and are negative.  Physical Exam Updated Vital Signs BP (!) 147/82   Pulse (!) 52   Temp  98.8 F (37.1 C) (Oral)   Resp 20   SpO2 99%   Physical Exam Vitals and nursing note reviewed.  Constitutional:      Appearance: Normal appearance.  HENT:     Head: Normocephalic and atraumatic.  Eyes:     Conjunctiva/sclera: Conjunctivae normal.  Cardiovascular:     Rate and Rhythm: Normal rate and regular rhythm.  Pulmonary:     Effort: Pulmonary effort is normal. No respiratory distress.     Breath sounds: Normal breath sounds.  Abdominal:     General: There is no distension.     Palpations: Abdomen is soft.     Tenderness: There is no abdominal tenderness.  Musculoskeletal:     Right lower leg: Swelling and  tenderness present.     Comments: Right lower leg swelling and generalized tenderness.   Skin:    General: Skin is warm and dry.  Neurological:     General: No focal deficit present.     Mental Status: He is alert and oriented to person, place, and time.    ED Results / Procedures / Treatments   Labs (all labs ordered are listed, but only abnormal results are displayed) Labs Reviewed  CBC WITH DIFFERENTIAL/PLATELET - Abnormal; Notable for the following components:      Result Value   WBC 3.6 (*)    All other components within normal limits  BASIC METABOLIC PANEL    EKG EKG Interpretation  Date/Time:  Monday October 22 2020 20:47:46 EDT Ventricular Rate:  49 PR Interval:  124 QRS Duration: 100 QT Interval:  404 QTC Calculation: 364 R Axis:   94 Text Interpretation: Sinus bradycardia with occasional Premature ventricular complexes Rightward axis Early repolarization Borderline ECG No significant change since last tracing Confirmed by Lacretia Leigh ([id number]) on 10/22/2020 8:55:56 PM  Radiology VAS [place] LOWER EXTREMITY VENOUS (DVT) ([facility] 7a-7p)  Result Date: 10/22/2020  Lower Venous DVT Study Patient Name:  Andrew Sullivan  Date of Exam:   10/22/2020 Medical Rec #: [id number]           Accession #:    [id number] Date of Birth: 24-Mar-1970            Patient  Gender: M Patient Age:   41 years Exam Location:  [facility] Procedure:      VAS [place] LOWER EXTREMITY VENOUS (DVT) Referring Phys: Marijean Bravo COUTURE --------------------------------------------------------------------------------  Indications: Edema.  Risk Factors: DVT 10/24/18 RLE functional quadriplegia. Limitations: Edema, quadriplegia. Comparison Study: Prior study done 10/24/2018 Performing Technologist: Sharion Dove RVS  Examination Guidelines: A complete evaluation includes B-mode imaging, spectral Doppler, color Doppler, and power Doppler as needed of all accessible portions of each vessel. Bilateral testing is considered an integral part of a complete examination. Limited examinations for reoccurring indications may be performed as noted. The reflux portion of the exam is performed with the patient in reverse Trendelenburg.  +---------+---------------+---------+-----------+----------+-------------------+ RIGHT    CompressibilityPhasicitySpontaneityPropertiesThrombus Aging      +---------+---------------+---------+-----------+----------+-------------------+ CFV      Full           Yes      Yes                                      +---------+---------------+---------+-----------+----------+-------------------+ SFJ      Full                                                             +---------+---------------+---------+-----------+----------+-------------------+ FV Prox  Full                                                             +---------+---------------+---------+-----------+----------+-------------------+ FV Mid   Full                                                             +---------+---------------+---------+-----------+----------+-------------------+  FV DistalFull                                                             +---------+---------------+---------+-----------+----------+-------------------+ PFV      Full                                                              +---------+---------------+---------+-----------+----------+-------------------+ POP      Full           Yes      Yes                                      +---------+---------------+---------+-----------+----------+-------------------+ PTV      None                                         Acute               +---------+---------------+---------+-----------+----------+-------------------+ PERO                                                  Not well visualized +---------+---------------+---------+-----------+----------+-------------------+ Gastroc  None                                         Acute               +---------+---------------+---------+-----------+----------+-------------------+   +----+---------------+---------+-----------+----------+--------------+ LEFTCompressibilityPhasicitySpontaneityPropertiesThrombus Aging +----+---------------+---------+-----------+----------+--------------+ CFV Full           Yes      Yes                                 +----+---------------+---------+-----------+----------+--------------+     Summary: RIGHT: - Findings consistent with acute deep vein thrombosis involving the right posterior tibial veins, and right gastrocnemius veins.  LEFT: - No evidence of common femoral vein obstruction.  *See table(s) above for measurements and observations. Electronically signed by Monica Martinez MD on 10/22/2020 at 4:15:22 PM.    Final     Procedures Procedures   Medications Ordered in ED Medications - No data to display  ED Course  I have reviewed the triage vital signs and the nursing notes.  Pertinent labs & imaging results that were available during my care of the patient were reviewed by me and considered in my medical decision making (see chart for details).    MDM Rules/Calculators/A&P                           Patient is 50 y/o male who presents with 2 days of right leg discomfort and  swelling. Patient has history of  DVT and states symptoms feel similar. LE US showed acute DVT involving right posterior tibial veins and right gastrocnemius veins. After [place], patient developed chest pain and shortness of breath. Ordered CT angiography of chest to evaluate for PE. Patient stated he did not want to wait for the scan as he needed to catch the bus home. Tried to provide patient with alternate means of transportation but he did not think a cab would be able to accommodate his wheelchair. Discussed need for evaluation of PE to patient and that we would find transportation for him, and he refused. Instructed patient he needs to fill the prescription for Eliquis and begin taking immediately. Patient agrees and stated he would try to return tomorrow for further evaluation.   We discussed the nature and purpose, risks and benefits, as well as, the alternatives of treatment. Time was given to allow the opportunity to ask questions and consider their options, and after the discussion, the patient decided to refuse the offerred treatment. The patient was informed that refusal could lead to, but was not limited to, death, permanent disability, or severe pain. If present, I asked the relatives or significant others to dissuade them without success. Prior to refusing, I determined that the patient had the capacity to make their decision and understood the consequences of that decision. After refusal, I made every reasonable opportunity to treat them to the best of my ability.  The patient was notified that they may return to the emergency department at any time for further treatment.    Patient eloped before he was able to sign AMA paperwork. Printed prescription for Eliquis starter pack but patient left before receiving paperwork. I modified the order and and sent Eliquis prescription to pharmacy on-file.  Final Clinical Impression(s) / ED Diagnoses Final diagnoses:  Acute deep vein thrombosis (DVT) of  other specified vein of right lower extremity ([place])    Rx / DC Orders ED Discharge Orders          Ordered    apixaban (ELIQUIS) 5 MG TABS tablet  Status:  Discontinued        10/22/20 2121    apixaban (ELIQUIS) 5 MG TABS tablet        10/22/20 2132             Narya Beavin T, PA-C 10/22/20 2315    Lacretia Leigh, MD 10/24/20 1720

## 2020-10-22 NOTE — ED Notes (Addendum)
Pt moving self from bed to chair, notified RN he plans to leave AMA. MD Zenia Resides and PA [place] notified.

## 2020-10-22 NOTE — ED Notes (Signed)
Pt notified me his chest pain has worsen. Triage RN notified.

## 2020-10-22 NOTE — ED Triage Notes (Signed)
Pt reports R leg swelling x 2 days. Hx DVT. Not anticoagulated. Denies CP or shob.

## 2020-10-24 ENCOUNTER — Emergency Department (HOSPITAL_COMMUNITY)
Admission: EM | Admit: 2020-10-24 | Discharge: 2020-10-24 | Disposition: A | Discharge status: Home / Self Care | Payer: Medicare HMO | Attending: Emergency Medicine | Admitting: Emergency Medicine

## 2020-10-24 ENCOUNTER — Emergency Department (HOSPITAL_COMMUNITY): Payer: Medicare HMO

## 2020-10-24 ENCOUNTER — Other Ambulatory Visit: Payer: Self-pay

## 2020-10-24 DIAGNOSIS — Z5321 Procedure and treatment not carried out due to patient leaving prior to being seen by health care provider: Secondary | ICD-10-CM | POA: Diagnosis not present

## 2020-10-24 DIAGNOSIS — R079 Chest pain, unspecified: Secondary | ICD-10-CM | POA: Insufficient documentation

## 2020-10-24 DIAGNOSIS — J449 Chronic obstructive pulmonary disease, unspecified: Secondary | ICD-10-CM | POA: Diagnosis not present

## 2020-10-24 DIAGNOSIS — I82401 Acute embolism and thrombosis of unspecified deep veins of right lower extremity: Secondary | ICD-10-CM | POA: Diagnosis not present

## 2020-10-24 DIAGNOSIS — R0789 Other chest pain: Secondary | ICD-10-CM | POA: Diagnosis not present

## 2020-10-24 LAB — BASIC METABOLIC PANEL
Anion gap: 6 (ref 5–15)
BUN: 16 mg/dL (ref 6–20)
CO2: 28 mmol/L (ref 22–32)
Calcium: 9.1 mg/dL (ref 8.9–10.3)
Chloride: 105 mmol/L (ref 98–111)
Creatinine, Ser: 0.99 mg/dL (ref 0.61–1.24)
GFR, Estimated: >60 mL/min (ref >60)
Glucose, Bld: 84 mg/dL (ref 70–99)
Potassium: 4.2 mmol/L (ref 3.5–5.1)
Sodium: 139 mmol/L (ref 135–145)

## 2020-10-24 LAB — CBC WITH DIFFERENTIAL/PLATELET
Abs Immature Granulocytes: 0.01 10*3/uL (ref 0.00–0.07)
Basophils Absolute: 0 10*3/uL (ref 0.0–0.1)
Basophils Relative: 1 %
Eosinophils Absolute: 0.1 10*3/uL (ref 0.0–0.5)
Eosinophils Relative: 3 %
HCT: 44.2 % (ref 39.0–52.0)
Hemoglobin: 14.3 g/dL (ref 13.0–17.0)
Immature Granulocytes: 0 %
Lymphocytes Relative: 25 %
Lymphs Abs: 0.9 10*3/uL (ref 0.7–4.0)
MCH: 30.7 pg (ref 26.0–34.0)
MCHC: 32.4 g/dL (ref 30.0–36.0)
MCV: 94.8 fL (ref 80.0–100.0)
Monocytes Absolute: 0.4 10*3/uL (ref 0.1–1.0)
Monocytes Relative: 12 %
Neutro Abs: 2.1 10*3/uL (ref 1.7–7.7)
Neutrophils Relative %: 59 %
Platelets: 196 10*3/uL (ref 150–400)
RBC: 4.66 MIL/uL (ref 4.22–5.81)
RDW: 12.8 % (ref 11.5–15.5)
WBC: 3.6 10*3/uL — ABNORMAL LOW (ref 4.0–10.5)
nRBC: 0 % (ref 0.0–0.2)

## 2020-10-24 LAB — TROPONIN I (HIGH SENSITIVITY): Troponin I (High Sensitivity): 5 ng/L (ref <18)

## 2020-10-24 MED ORDER — APIXABAN 5 MG PO TABS
10.0000 mg | ORAL_TABLET | Freq: Once | ORAL | Status: DC
  Filled 2020-10-24: qty 2

## 2020-10-24 NOTE — ED Provider Notes (Cosign Needed)
Emergency Medicine Provider Triage Evaluation Note  Andrew Sullivan , a 50 y.o. male  was evaluated in triage.  Pt complains of left-sided chest pain for the past 2 days.  Diagnosed with DVT 2 days ago and was prescribed Eliquis however due to his understanding he was never prescribed any medications because he left AMA.  Chart review shows that he refused CT angio to rule out PE.  He admits that he has had the chest pain ever since he was here for his evaluation 2 days ago when we completed an ultrasound.  Review of Systems  Positive: Chest pain Negative: Shortness of breath  Physical Exam  There were no vitals taken for this visit. Gen:   Awake, no distress   Resp:  Normal effort  MSK:   Moves extremities without difficulty  Other:    Medical Decision Making  Medically screening exam initiated at 3:57 PM.  Appropriate orders placed.  Andrew Sullivan was informed that the remainder of the evaluation will be completed by another provider, this initial triage assessment does not replace that evaluation, and the importance of remaining in the ED until their evaluation is complete.  I have ordered CT angio to rule out PE. States he can wait until 9pm. I encouraged him to stay despite an extensive wait time as if he does have a PE that is not appropriately treated this can be life-threatening.   Delia Heady, PA-C 10/24/20 1558

## 2020-10-24 NOTE — ED Notes (Signed)
Went to room to complete assessment and admin medication and pt was not in the room. Found pt all the way at the other end of the department. When tried to explain to the pt who I was and that I needed to do an assessment and give medication pt stated that he wanted to leave that he was trying to leave and getting ready to leave. Asked pt if he would sign the AMA form and he refused to do so. Pt stated that he didn't do it before he just wanted to leave and wheelchaired out of the department.

## 2020-10-24 NOTE — ED Notes (Signed)
Pt needs repeat BMP, 1st hemolyzed per lab

## 2020-10-24 NOTE — ED Notes (Signed)
Received verbal report from Alana B RN at this time °

## 2020-10-24 NOTE — ED Triage Notes (Signed)
Pt reports left sided CP for the last 2 days, denies radiation, or SOB with the pain. Pt reports DVT in right lower leg. No distress noted at this time.

## 2020-10-24 NOTE — ED Notes (Signed)
Not in room at this time 

## 2020-10-24 NOTE — ED Provider Notes (Signed)
9:15 PM-patient was not in the room when I initially went to see him.  At that time he was in radiology getting a CTA of the chest.  He presents for evaluation of left-sided chest pain for 2 days.  He was recently diagnosed with DVT of the right leg, and left AMA, before completion of treatment, to include CTA of the chest to rule out PE.  A prescription for Eliquis was sent to his pharmacy at time of discharge.  I ordered Eliquis, while the patient was in CT.  I have now been informed that the patient left AMA and refused to take the Eliquis.  I did not see the patient, prior to his leaving.   Daleen Bo, MD 10/24/20 2116

## 2020-10-24 NOTE — ED Notes (Signed)
Pt stated he was going to our cafe

## 2020-10-25 ENCOUNTER — Emergency Department (HOSPITAL_COMMUNITY): Payer: Medicare HMO

## 2020-10-25 ENCOUNTER — Other Ambulatory Visit: Payer: Self-pay

## 2020-10-25 ENCOUNTER — Emergency Department (HOSPITAL_COMMUNITY)
Admission: EM | Admit: 2020-10-25 | Discharge: 2020-10-25 | Disposition: A | Discharge status: Home / Self Care | Payer: Medicare HMO | Attending: Emergency Medicine | Admitting: Emergency Medicine

## 2020-10-25 DIAGNOSIS — Z96642 Presence of left artificial hip joint: Secondary | ICD-10-CM | POA: Insufficient documentation

## 2020-10-25 DIAGNOSIS — Z87891 Personal history of nicotine dependence: Secondary | ICD-10-CM | POA: Diagnosis not present

## 2020-10-25 DIAGNOSIS — J439 Emphysema, unspecified: Secondary | ICD-10-CM | POA: Diagnosis not present

## 2020-10-25 DIAGNOSIS — Z85038 Personal history of other malignant neoplasm of large intestine: Secondary | ICD-10-CM | POA: Insufficient documentation

## 2020-10-25 DIAGNOSIS — R600 Localized edema: Secondary | ICD-10-CM | POA: Diagnosis not present

## 2020-10-25 DIAGNOSIS — Z7901 Long term (current) use of anticoagulants: Secondary | ICD-10-CM | POA: Diagnosis not present

## 2020-10-25 DIAGNOSIS — R079 Chest pain, unspecified: Secondary | ICD-10-CM

## 2020-10-25 DIAGNOSIS — R0789 Other chest pain: Secondary | ICD-10-CM

## 2020-10-25 DIAGNOSIS — I700 Atherosclerosis of aorta: Secondary | ICD-10-CM | POA: Diagnosis not present

## 2020-10-25 LAB — BASIC METABOLIC PANEL
Anion gap: 8 (ref 5–15)
BUN: 16 mg/dL (ref 6–20)
CO2: 26 mmol/L (ref 22–32)
Calcium: 9 mg/dL (ref 8.9–10.3)
Chloride: 107 mmol/L (ref 98–111)
Creatinine, Ser: 1 mg/dL (ref 0.61–1.24)
GFR, Estimated: >60 mL/min (ref >60)
Glucose, Bld: 133 mg/dL — ABNORMAL HIGH (ref 70–99)
Potassium: 4.1 mmol/L (ref 3.5–5.1)
Sodium: 141 mmol/L (ref 135–145)

## 2020-10-25 LAB — TROPONIN I (HIGH SENSITIVITY): Troponin I (High Sensitivity): 6 ng/L (ref <18)

## 2020-10-25 LAB — CBC WITH DIFFERENTIAL/PLATELET
Abs Immature Granulocytes: 0.03 10*3/uL (ref 0.00–0.07)
Basophils Absolute: 0 10*3/uL (ref 0.0–0.1)
Basophils Relative: 1 %
Eosinophils Absolute: 0.1 10*3/uL (ref 0.0–0.5)
Eosinophils Relative: 3 %
HCT: 44.9 % (ref 39.0–52.0)
Hemoglobin: 14.7 g/dL (ref 13.0–17.0)
Immature Granulocytes: 1 %
Lymphocytes Relative: 27 %
Lymphs Abs: 0.9 10*3/uL (ref 0.7–4.0)
MCH: 30.8 pg (ref 26.0–34.0)
MCHC: 32.7 g/dL (ref 30.0–36.0)
MCV: 93.9 fL (ref 80.0–100.0)
Monocytes Absolute: 0.4 10*3/uL (ref 0.1–1.0)
Monocytes Relative: 11 %
Neutro Abs: 2 10*3/uL (ref 1.7–7.7)
Neutrophils Relative %: 57 %
Platelets: 140 10*3/uL — ABNORMAL LOW (ref 150–400)
RBC: 4.78 MIL/uL (ref 4.22–5.81)
RDW: 12.5 % (ref 11.5–15.5)
WBC: 3.4 10*3/uL — ABNORMAL LOW (ref 4.0–10.5)
nRBC: 0 % (ref 0.0–0.2)

## 2020-10-25 MED ORDER — APIXABAN 5 MG PO TABS
ORAL_TABLET | ORAL | 0 refills | Status: DC
Start: 2020-10-25 — End: 2020-12-18

## 2020-10-25 MED ORDER — IOHEXOL 350 MG/ML SOLN
75.0000 mL | Freq: Once | INTRAVENOUS | Status: AC | PRN
  Administered 2020-10-25: 75 mL via INTRAVENOUS

## 2020-10-25 MED ORDER — APIXABAN 5 MG PO TABS
10.0000 mg | ORAL_TABLET | Freq: Once | ORAL | Status: AC
  Administered 2020-10-25: 10 mg via ORAL
  Filled 2020-10-25: qty 2

## 2020-10-25 NOTE — ED Provider Notes (Signed)
[facility] EMERGENCY DEPARTMENT Provider Note   CSN: [id number] Arrival date & time: 10/25/20  1321     History Chief Complaint  Patient presents with   Chest Pain    Andrew Sullivan is a 50 y.o. male.   Chest Pain Associated symptoms: no abdominal pain, no back pain, no cough, no fever, no palpitations, no shortness of breath and no vomiting    50 year old male with a past medical history of traumatic paraplegia many years ago presenting to the emergency department with left-sided chest pain.  Patient states that his chest pain been present constantly for the past 3 days.  It is aching in nature.  It is not pleuritic.  It is not associated with movement.  Patient does not exert himself, so does not know if it is exertion.  He denies any fever or cough.  He denies any shortness of breath, diaphoresis, nausea, vomiting.  Patient states that he was diagnosed with a right lower extremity DVT but is not been able to pick up his Eliquis yet.  Past Medical History:  Diagnosis Date   Anemia    Erectile dysfunction    Gross hematuria    History of recurrent UTIs    History of spinal cord injury    90 (age 29 fell from ladder) w/ cervical spine fx  s/p  c4 -- c6  fusion--  residual paraplegic bilateral legs (great below T8 uses w/c and can transfer self   Paraparesis of both lower limbs (HCC)    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 13 in 38   Spastic tetraplegia [facility])     Patient Active Problem List   Diagnosis Date Noted   Abnormal vision screen 09/25/2020   Abnormal digital rectal exam    Benign neoplasm of transverse colon    Anal polyp    Acute gastritis without hemorrhage    Perianal mass 06/07/2020   Iron deficiency anemia 06/07/2020   Symptomatic anemia 05/13/2019   DVT (deep venous thrombosis) ([place]) 10/19/2018   Erectile dysfunction    Spastic tetraplegia ([place]) 12/06/2013   History of recurrent UTIs 05/19/2010   Functional quadriplegia  ([place]) 05/10/2009    Past Surgical History:  Procedure Laterality Date   BIOPSY  06/26/2020   Procedure: BIOPSY;  Surgeon: Jerene Bears, MD;  Location: [facility];  Service: Gastroenterology;;  EGD and COLON   CERVICAL FUSION  1988   C4 -- C6 w/ spinal cord injury   COLONOSCOPY WITH PROPOFOL N/A 06/26/2020   Procedure: COLONOSCOPY WITH PROPOFOL;  Surgeon: Jerene Bears, MD;  Location: [facility];  Service: Gastroenterology;  Laterality: N/A;   CYSTOSCOPY WITH RETROGRADE PYELOGRAM, URETEROSCOPY AND STENT PLACEMENT Left 12/27/2013   Procedure: CYSTOSCOPY WITH RETROGRADE PYELOGRAM, URETEROSCOPY AND STENT PLACEMENT;  Surgeon: Reece Packer, MD;  Location: [place];  Service: Urology;  Laterality: Left;   CYSTOSCOPY/RETROGRADE/URETEROSCOPY Left 01/09/2014   Procedure: CYSTOSCOPY/RETROGRADE/URETEROSCOPY, URETERAL BIOPSY AND STENT EXCHANGE;  Surgeon: Alexis Frock, MD;  Location: [facility];  Service: Urology;  Laterality: Left;   ESOPHAGOGASTRODUODENOSCOPY (EGD) WITH PROPOFOL N/A 06/26/2020   Procedure: ESOPHAGOGASTRODUODENOSCOPY (EGD) WITH PROPOFOL;  Surgeon: Jerene Bears, MD;  Location: [facility];  Service: Gastroenterology;  Laterality: N/A;   HIP ARTHROPLASTY  08/12/2011   Procedure: ARTHROPLASTY BIPOLAR HIP;  Surgeon: Mauri Pole, MD;  Location: [facility];  Service: Orthopedics;  Laterality: Left;  Hemi-arthroplasty   POLYPECTOMY  06/26/2020   Procedure: POLYPECTOMY;  Surgeon: Jerene Bears, MD;  Location: [facility];  Service: Gastroenterology;;       Family History  Problem Relation Age of Onset   Arthritis Mother    Hypertension Mother    Colon cancer Neg Hx    Esophageal cancer Neg Hx    Pancreatic cancer Neg Hx    Stomach cancer Neg Hx    Liver disease Neg Hx     Social History   Tobacco Use   Smoking status: Former    Packs/day: 0.25    Years: 0.50    Pack years: 0.13    Types: Cigarettes    Quit date: 12/27/1986    Years since quitting: 33.8    Smokeless tobacco: Never  Vaping Use   Vaping Use: Never used  Substance Use Topics   Alcohol use: No   Drug use: No    Home Medications Prior to Admission medications   Medication Sig Start Date End Date Taking? Authorizing Provider  apixaban (ELIQUIS) 5 MG TABS tablet Take 2 tablets ('10mg'$ ) twice daily for 7 days, then 1 tablet ('5mg'$ ) twice daily 10/25/20  Yes Claud Kelp, MD  baclofen (LIORESAL) 20 MG tablet Take 1 tablet (20 mg total) by mouth 4 (four) times daily. 09/24/20   Alcus Dad, MD  ferrous sulfate 325 (65 FE) MG tablet Take 1 tablet (325 mg total) by mouth daily with breakfast. 06/07/20   Alcus Dad, MD  Hydrocortisone, Perianal, (PROCTO-PAK) 1 % CREA Apply to rectal area twice daily as needed 06/07/20   Alcus Dad, MD  pantoprazole (PROTONIX) 40 MG tablet Take 1 tablet (40 mg total) by mouth daily before breakfast. Patient not taking: Reported on 09/24/2020 06/26/20 06/26/21  Jerene Bears, MD  tadalafil (CIALIS) 5 MG tablet Take 5 mg by mouth daily as needed for erectile dysfunction. 09/13/19   [provider]    Allergies    Penicillins  Review of Systems   Review of Systems  Constitutional:  Negative for chills and fever.  HENT:  Negative for ear pain and sore throat.   Eyes:  Negative for pain and visual disturbance.  Respiratory:  Negative for cough and shortness of breath.   Cardiovascular:  Positive for chest pain. Negative for palpitations.  Gastrointestinal:  Negative for abdominal pain and vomiting.  Genitourinary:  Negative for dysuria and hematuria.  Musculoskeletal:  Negative for arthralgias and back pain.  Skin:  Negative for color change and rash.  Neurological:  Negative for seizures and syncope.  All other systems reviewed and are negative.  Physical Exam Updated Vital Signs BP (!) 142/77   Pulse 74   Temp 98.3 F (36.8 C) (Oral)   Resp 15   SpO2 99%   Physical Exam Vitals and nursing note reviewed.  Constitutional:       General: He is not in acute distress.    Appearance: He is well-developed and normal weight. He is not ill-appearing or toxic-appearing.  HENT:     Head: Normocephalic and atraumatic.  Eyes:     Conjunctiva/sclera: Conjunctivae normal.  Cardiovascular:     Rate and Rhythm: Normal rate and regular rhythm.     Heart sounds: No murmur heard. Pulmonary:     Effort: Pulmonary effort is normal. No respiratory distress.     Breath sounds: Normal breath sounds.  Abdominal:     Palpations: Abdomen is soft.     Tenderness: There is no abdominal tenderness.  Musculoskeletal:     Cervical back: Neck supple.     Right lower leg:  Edema present.     Left lower leg: No edema.  Skin:    General: Skin is warm and dry.  Neurological:     Mental Status: He is alert.    ED Results / Procedures / Treatments   Labs (all labs ordered are listed, but only abnormal results are displayed) Labs Reviewed  CBC WITH DIFFERENTIAL/PLATELET - Abnormal; Notable for the following components:      Result Value   WBC 3.4 (*)    Platelets 140 (*)    All other components within normal limits  BASIC METABOLIC PANEL - Abnormal; Notable for the following components:   Glucose, Bld 133 (*)    All other components within normal limits  TROPONIN I (HIGH SENSITIVITY)    EKG EKG Interpretation  Date/Time:  Thursday October 25 2020 13:45:13 EDT Ventricular Rate:  67 PR Interval:  114 QRS Duration: 86 QT Interval:  374 QTC Calculation: 395 R Axis:   92 Text Interpretation: Normal sinus rhythm Rightward axis Early repolarization Borderline ECG since last tracing no significant change Confirmed by Malvin Johns [phone]) on 10/25/2020 7:33:58 PM  Radiology DG Chest 2 View  Result Date: 10/24/2020 CLINICAL DATA:  Chest pain EXAM: CHEST - 2 VIEW COMPARISON:  02/17/2020 FINDINGS: The heart size and mediastinal contours are within normal limits. Pulmonary hyperinflation, as can be seen with COPD. Clear lungs. No  pleural effusion. The visualized skeletal structures are unremarkable. IMPRESSION: No active cardiopulmonary disease. Electronically Signed   By: Merilyn Baba M.D.   On: 10/24/2020 16:41   CT Angio Chest PE W and/or Wo Contrast  Result Date: 10/25/2020 CLINICAL DATA:  PE suspected, high prob Chest pain.  Positive DVT. EXAM: CT ANGIOGRAPHY CHEST WITH CONTRAST TECHNIQUE: Multidetector CT imaging of the chest was performed using the standard protocol during bolus administration of intravenous contrast. Multiplanar CT image reconstructions and MIPs were obtained to evaluate the vascular anatomy. CONTRAST:  30m OMNIPAQUE IOHEXOL 350 MG/ML SOLN COMPARISON:  Radiograph yesterday.  Chest CT 11/01/2019 FINDINGS: Cardiovascular: There are no filling defects within the pulmonary arteries to suggest pulmonary embolus. The thoracic aorta is normal in caliber. Mild aortic atherosclerosis. Cannot assess for dissection given phase of contrast tailored to pulmonary artery evaluation. Heart is normal in size. No pericardial effusion. Mediastinum/Nodes: Small mediastinal and hilar lymph nodes not enlarged by size criteria. No visualized thyroid nodule. No esophageal wall thickening. Lungs/Pleura: Stable appearance of subpleural scarring in the right lower lobe from prior exam. There is no acute airspace disease. Minimal apical predominant emphysema. Mild biapical pleuroparenchymal scarring. No pleural fluid. No pulmonary nodule or mass. The trachea and central bronchi are patent. Upper Abdomen: Stable cyst in the right hepatic dome. Additional tiny cyst in the subcapsular right lobe. No acute upper abdominal findings. Musculoskeletal: There are no acute or suspicious osseous abnormalities. Similar vacuum phenomenon at the sternomanubrial joint. No focal chest wall soft tissue abnormality common generalized paucity of body fat. Review of the MIP images confirms the above findings. IMPRESSION: 1. No pulmonary embolus or acute  intrathoracic abnormality. 2. Mild emphysema.  Stable right lung base scarring. Aortic Atherosclerosis (ICD10-I70.0) and Emphysema (ICD10-J43.9). Electronically Signed   By: MKeith RakeM.D.   On: 10/25/2020 15:39    Procedures Procedures   Medications Ordered in ED Medications  iohexol (OMNIPAQUE) 350 MG/ML injection 75 mL (75 mLs Intravenous Contrast Given 10/25/20 1510)  apixaban (ELIQUIS) tablet 10 mg (10 mg Oral Given 10/25/20 2023)    ED Course  I have reviewed  the triage vital signs and the nursing notes.  Pertinent labs & imaging results that were available during my care of the patient were reviewed by me and considered in my medical decision making (see chart for details).    MDM Rules/Calculators/A&P                           50 yo M with above PMHx presenting to the ED with atypical chest pain.  Vital signs reviewed, within acceptable limits.  The patient was recently diagnosed with a right-sided DVT and has not started anticoagulants yet.  Differential includes ACS, pulmonary embolism, pneumonia, pneumothorax, costochondritis, GERD.  His history and presentation is not consistent with aortic dissection.  The patient's EKG has no anatomic ST elevation or ST depression to be concerning for acute ischemia.  Initial troponin is negative.  Given that his atypical chest pain has been constant for the past 3 days, I believe that a single troponin is sufficient to further stratify him, I do not believe he is experiencing ACS.  CTA PE study obtained in triage, negative for pulmonary embolism, pneumonia, or pneumothorax.  Patient provided with Eliquis here in the emergency department.  He states that his pharmacy did not have the Eliquis prescription when he called there, so I represcribed it and we gave him a 30-day card for Eliquis.  I informed him of the importance of starting this he did not get a pulmonary embolism.  I went to reevaluate the patient after he received his Eliquis, the  patient had eloped from the emergency department.  I was not able to discuss return precautions with him.  Final Clinical Impression(s) / ED Diagnoses Final diagnoses:  Atypical chest pain    Rx / DC Orders ED Discharge Orders          Ordered    apixaban (ELIQUIS) 5 MG TABS tablet        10/25/20 1942             Claud Kelp, MD 10/26/20 0036    Malvin Johns, MD 10/26/20 [phone]

## 2020-10-25 NOTE — ED Notes (Signed)
PT left after IV removed and before discharge teaching and instructions were performed. Pt left while this RN was in another room.

## 2020-10-25 NOTE — ED Triage Notes (Signed)
Pt dx with DVT on 8/15. Seen yesterday for chest pain and was supposed to have CTA but had difficulty with IV so was not successful and pt left before they could get another IV. Has not picked up the Eliquis he was prescribed on 8/15.

## 2020-10-25 NOTE — Discharge Instructions (Addendum)
Information on my medicine - ELIQUIS (apixaban)  Why was Eliquis prescribed for you? Eliquis was prescribed to treat blood clots that may have been found in the veins of your legs (deep vein thrombosis) or in your lungs (pulmonary embolism) and to reduce the risk of them occurring again.  What do You need to know about Eliquis ? The starting dose is 10 mg (two 5 mg tablets) taken TWICE daily for the FIRST SEVEN (7) DAYS, then on (enter date)  11/01/20  the dose is reduced to ONE 5 mg tablet taken TWICE daily.  Eliquis may be taken with or without food.   Try to take the dose about the same time in the morning and in the evening. If you have difficulty swallowing the tablet whole please discuss with your pharmacist how to take the medication safely.  Take Eliquis exactly as prescribed and DO NOT stop taking Eliquis without talking to the doctor who prescribed the medication.  Stopping may increase your risk of developing a new blood clot.  Refill your prescription before you run out.  After discharge, you should have regular check-up appointments with your healthcare provider that is prescribing your Eliquis.    What do you do if you miss a dose? If a dose of ELIQUIS is not taken at the scheduled time, take it as soon as possible on the same day and twice-daily administration should be resumed. The dose should not be doubled to make up for a missed dose.  Important Safety Information A possible side effect of Eliquis is bleeding. You should call your healthcare provider right away if you experience any of the following: Bleeding from an injury or your nose that does not stop. Unusual colored urine (red or dark brown) or unusual colored stools (red or black). Unusual bruising for unknown reasons. A serious fall or if you hit your head (even if there is no bleeding).  Some medicines may interact with Eliquis and might increase your risk of bleeding or clotting while on Eliquis. To  help avoid this, consult your healthcare provider or pharmacist prior to using any new prescription or non-prescription medications, including herbals, vitamins, non-steroidal anti-inflammatory drugs (NSAIDs) and supplements.  This website has more information on Eliquis (apixaban): http://www.eliquis.com/eliquis/home

## 2020-10-25 NOTE — ED Provider Notes (Signed)
Emergency Medicine Provider Triage Evaluation Note  Andrew Sullivan , a 50 y.o. male  was evaluated in triage.  Pt complains of chest pain.  Chest pain has been constant since 8/15.  Pain is located to left-sided chest radiating pain is described as a "aching."  No associated shortness of breath, nausea, vomiting, diaphoresis, palpitations.  Per chart review patient was diagnosed with right  posterior tibial veins, and right gastrocnemius veins on 8/15.  CTA of chest was ordered to evaluate for PE, patient left prior to obtaining this on 8/15.  Patient was seen yesterday for similar complaints of chest pain.  Patient was advised AMA prior to CTA being obtained.  Patient reports that he has not been able to start Eliquis for his DVT.  Review of Systems  Positive: Chest pain, leg swelling Negative: Hemoptysis, cough, shortness of breath, nausea, vomiting, diaphoresis, numbness, weakness, rash, color change  Physical Exam  BP (!) 141/83 (BP Location: Right Arm)   Pulse 76   Temp 98.7 F (37.1 C) (Oral)   Resp 18   SpO2 98%  Gen:   Awake, no distress   Resp:  Normal effort, lungs clear to auscultation bilaterally MSK:   Moves extremities without difficulty, swelling to right lower extremity.  No tenderness, rash, or color change noted to right lower extremity. Other:    Medical Decision Making  Medically screening exam initiated at 1:44 PM.  Appropriate orders placed.  Andrew Sullivan was informed that the remainder of the evaluation will be completed by another provider, this initial triage assessment does not replace that evaluation, and the importance of remaining in the ED until their evaluation is complete.  Patient was advised to stay for CTA and initiation of Eliquis medication.  Both of these modalities were stressed to patient.  Patient reports that he was today however at 9pm due to requiring public transport to return home.   Andrew Beckwith, PA-C 10/25/20 1352     Valarie Merino, MD 10/27/20 [phone]

## 2020-10-29 ENCOUNTER — Ambulatory Visit: Payer: Self-pay | Admitting: General Surgery

## 2020-10-29 DIAGNOSIS — K642 Third degree hemorrhoids: Secondary | ICD-10-CM | POA: Diagnosis not present

## 2020-10-29 NOTE — H&P (Signed)
History of Present Illness: Andrew Sullivan is a 50 y.o. male who is seen today as an office consultation at the request of Dr. Andria Frames for evaluation of hemorrhoids. Colonoscopy performed in April shows atypical perirectal masses and polypoid tissue around the dentate line.  Patient states he has had trouble with rectal pain and bleeding for the past 6 months.  He reports regular bowel habits without constipation or straining.     Review of Systems: A complete review of systems was obtained from the patient.  I have reviewed this information and discussed as appropriate with the patient.  See HPI as well for other ROS.       Medical History: Past Medical History      Past Medical History:  Diagnosis Date   DVT (deep venous thrombosis) (CMS-HCC)      Spastic paraplegia   There is no problem list on file for this patient.     Past Surgical History  No past surgical history on file.      Allergies  No Known Allergies           Current Outpatient Medications on File Prior to Visit  Medication Sig Dispense Refill   baclofen (LIORESAL) 20 MG tablet         ELIQUIS 5 mg tablet TAKE 2 TABLETS BY MOUTH TWICE DAILY FOR 7 DAYS THEN 1 TABLET TWICE DAILY        No current facility-administered medications on file prior to visit.      Family History  History reviewed. No pertinent family history.      Social History       Tobacco Use  Smoking Status Never Smoker  Smokeless Tobacco Never Used      Social History  Social History        Socioeconomic History   Marital status: Single  Tobacco Use   Smoking status: Never Smoker   Smokeless tobacco: Never Used  Substance and Sexual Activity   Drug use: Defer   Sexual activity: Defer        Objective:         Vitals:    10/29/20 1004  BP: 120/76  Pulse: 66  Temp: 36.8 C (98.3 F)  SpO2: 99%      Exam Gen: NAD Abd: soft Rectal grade 3 hemorrhoids, decreased tone     Labs, Imaging and Diagnostic  Testing:   Procedure: Anoscopy Surgeon: Marcello Moores After the risks and benefits were explained, written consent was obtained for above procedure.  A medical assistant chaperone was present thoroughout the entire procedure.  Anesthesia: none Diagnosis: rectal bleeding Findings: Grade 3 left lateral and right anterior hemorrhoids, grade 2 right posterior hemorrhoid with active inflammation.  Patient tender on examination.     Assessment and Plan:  Diagnoses and all orders for this visit:   Prolapsed internal hemorrhoids, grade 3     Patient with significant internal inflammation and grade 2 and 3 hemorrhoids.  We discussed standard hemorrhoidectomy versus rubber band ligation versus transanal dearterialization.  We discussed that given his paraplegia and constant sitting, hemorrhoidectomy would not be a good procedure due to postoperative pain for likely the next 6 months as things slowly healed.  We discussed that rubber band ligation has a less than 50% chance of resolving his symptoms given the stage of his hemorrhoids.  I think transanal dearterialization would be a good option for him.  This would most likely result in pain for 2 to 3 weeks and then  his symptoms should settle down.  The likelihood of controlling his bleeding long-term is greater than 90%.  After this discussion the patient agreed to the procedure.  All questions were answered.

## 2020-11-01 ENCOUNTER — Telehealth: Payer: Self-pay | Admitting: Physician Assistant

## 2020-11-01 ENCOUNTER — Ambulatory Visit (INDEPENDENT_AMBULATORY_CARE_PROVIDER_SITE_OTHER): Payer: Medicare HMO | Admitting: Physician Assistant

## 2020-11-01 ENCOUNTER — Encounter: Payer: Self-pay | Admitting: Physician Assistant

## 2020-11-01 VITALS — BP 180/70 | HR 62 | Ht 75.0 in

## 2020-11-01 DIAGNOSIS — K642 Third degree hemorrhoids: Secondary | ICD-10-CM | POA: Diagnosis not present

## 2020-11-01 DIAGNOSIS — B9681 Helicobacter pylori [H. pylori] as the cause of diseases classified elsewhere: Secondary | ICD-10-CM | POA: Diagnosis not present

## 2020-11-01 DIAGNOSIS — D509 Iron deficiency anemia, unspecified: Secondary | ICD-10-CM

## 2020-11-01 DIAGNOSIS — K2970 Gastritis, unspecified, without bleeding: Secondary | ICD-10-CM

## 2020-11-01 MED ORDER — PANTOPRAZOLE SODIUM 40 MG PO TBEC: 40.0000 mg | DELAYED_RELEASE_TABLET | Freq: Two times a day (BID) | ORAL | 1 refills | Status: DC

## 2020-11-01 MED ORDER — PYLERA 140-125-125 MG PO CAPS: 3.0000 | ORAL_CAPSULE | Freq: Three times a day (TID) | ORAL | 0 refills | Status: DC
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
## 2020-11-01 NOTE — Telephone Encounter (Signed)
Pt states that Pylera is not covered by his insurance.

## 2020-11-01 NOTE — Patient Instructions (Addendum)
We have sent the following medications to your pharmacy for you to pick up at your convenience: Pylera take 3 capsules 4 times daily before meals and at bedtime for 14 days. Increase Pantoprazole 40 mg to twice daily for 14 days.   Repeat H. Pylori stool antigen in 10-12 weeks after completing Pylera (12/31/20 or after).   If you are age 50 or older, your body mass index should be between 23-30. Your Body mass index is 20.87 kg/m. If this is out of the aforementioned range listed, please consider follow up with your Primary Care Provider.  If you are age 48 or younger, your body mass index should be between 19-25. Your Body mass index is 20.87 kg/m. If this is out of the aformentioned range listed, please consider follow up with your Primary Care Provider.   __________________________________________________________  The [facility] providers would like to encourage you to use [facility] to communicate with providers for non-urgent requests or questions.  Due to long hold times on the telephone, sending your provider a message by [facility] may be a faster and more efficient way to get a response.  Please allow 48 business hours for a response.  Please remember that this is for non-urgent requests.

## 2020-11-01 NOTE — Progress Notes (Signed)
Addendum: Reviewed and agree with assessment and management plan. Kayte Borchard M, MD  

## 2020-11-02 MED ORDER — METRONIDAZOLE 250 MG PO TABS: 250.0000 mg | ORAL_TABLET | Freq: Four times a day (QID) | ORAL | 0 refills | Status: AC

## 2020-11-02 MED ORDER — DOXYCYCLINE HYCLATE 100 MG PO CAPS: 100.0000 mg | ORAL_CAPSULE | Freq: Two times a day (BID) | ORAL | 0 refills | Status: AC

## 2020-11-02 MED ORDER — BISMUTH SUBSALICYLATE 262 MG PO CHEW: 524.0000 mg | CHEWABLE_TABLET | Freq: Four times a day (QID) | ORAL | 0 refills | Status: AC

## 2020-11-02 NOTE — Telephone Encounter (Signed)
Sent in script for the Pylera breakdown to patient's pharmacy. Mychart message sent to patient informing him of what medications to take.

## 2020-11-13 ENCOUNTER — Emergency Department (HOSPITAL_COMMUNITY)
Admission: EM | Admit: 2020-11-13 | Discharge: 2020-11-13 | Disposition: A | Discharge status: Home / Self Care | Payer: Medicare HMO | Attending: Emergency Medicine | Admitting: Emergency Medicine

## 2020-11-13 ENCOUNTER — Other Ambulatory Visit: Payer: Self-pay

## 2020-11-13 DIAGNOSIS — R42 Dizziness and giddiness: Secondary | ICD-10-CM | POA: Diagnosis not present

## 2020-11-13 DIAGNOSIS — M542 Cervicalgia: Secondary | ICD-10-CM | POA: Insufficient documentation

## 2020-11-13 DIAGNOSIS — Y92410 Unspecified street and highway as the place of occurrence of the external cause: Secondary | ICD-10-CM | POA: Insufficient documentation

## 2020-11-13 DIAGNOSIS — Z5321 Procedure and treatment not carried out due to patient leaving prior to being seen by health care provider: Secondary | ICD-10-CM | POA: Diagnosis not present

## 2020-11-13 LAB — CBC WITH DIFFERENTIAL/PLATELET
Abs Immature Granulocytes: 0.01 10*3/uL (ref 0.00–0.07)
Basophils Absolute: 0 10*3/uL (ref 0.0–0.1)
Basophils Relative: 1 %
Eosinophils Absolute: 0.1 10*3/uL (ref 0.0–0.5)
Eosinophils Relative: 3 %
HCT: 41.7 % (ref 39.0–52.0)
Hemoglobin: 13.5 g/dL (ref 13.0–17.0)
Immature Granulocytes: 0 %
Lymphocytes Relative: 26 %
Lymphs Abs: 1 10*3/uL (ref 0.7–4.0)
MCH: 31.6 pg (ref 26.0–34.0)
MCHC: 32.4 g/dL (ref 30.0–36.0)
MCV: 97.7 fL (ref 80.0–100.0)
Monocytes Absolute: 0.4 10*3/uL (ref 0.1–1.0)
Monocytes Relative: 10 %
Neutro Abs: 2.3 10*3/uL (ref 1.7–7.7)
Neutrophils Relative %: 60 %
Platelets: 213 10*3/uL (ref 150–400)
RBC: 4.27 MIL/uL (ref 4.22–5.81)
RDW: 12.7 % (ref 11.5–15.5)
WBC: 3.8 10*3/uL — ABNORMAL LOW (ref 4.0–10.5)
nRBC: 0 % (ref 0.0–0.2)

## 2020-11-13 LAB — BASIC METABOLIC PANEL
Anion gap: 5 (ref 5–15)
BUN: 16 mg/dL (ref 6–20)
CO2: 26 mmol/L (ref 22–32)
Calcium: 8.7 mg/dL — ABNORMAL LOW (ref 8.9–10.3)
Chloride: 107 mmol/L (ref 98–111)
Creatinine, Ser: 1.02 mg/dL (ref 0.61–1.24)
GFR, Estimated: >60 mL/min (ref >60)
Glucose, Bld: 102 mg/dL — ABNORMAL HIGH (ref 70–99)
Potassium: 4.3 mmol/L (ref 3.5–5.1)
Sodium: 138 mmol/L (ref 135–145)
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
## 2020-11-13 NOTE — ED Notes (Signed)
Pt decided to leave while waiting for a room.  

## 2020-12-05 DIAGNOSIS — H5213 Myopia, bilateral: Secondary | ICD-10-CM | POA: Diagnosis not present

## 2020-12-05 DIAGNOSIS — H524 Presbyopia: Secondary | ICD-10-CM | POA: Diagnosis not present

## 2020-12-05 DIAGNOSIS — H52223 Regular astigmatism, bilateral: Secondary | ICD-10-CM | POA: Diagnosis not present

## 2020-12-18 ENCOUNTER — Other Ambulatory Visit: Payer: Self-pay

## 2020-12-20 MED ORDER — APIXABAN 5 MG PO TABS: 5.0000 mg | ORAL_TABLET | Freq: Two times a day (BID) | ORAL | 0 refills | Status: DC

## 2020-12-28 ENCOUNTER — Other Ambulatory Visit: Payer: Self-pay

## 2020-12-28 DIAGNOSIS — K644 Residual hemorrhoidal skin tags: Secondary | ICD-10-CM

## 2020-12-29 ENCOUNTER — Other Ambulatory Visit: Payer: Self-pay | Admitting: Family Medicine

## 2020-12-29 DIAGNOSIS — K644 Residual hemorrhoidal skin tags: Secondary | ICD-10-CM

## 2020-12-30 MED ORDER — HYDROCORTISONE (PERIANAL) 1 % EX CREA: TOPICAL_CREAM | CUTANEOUS | 0 refills | Status: DC

## 2021-01-03 DIAGNOSIS — G8250 Quadriplegia, unspecified: Secondary | ICD-10-CM | POA: Diagnosis not present

## 2021-01-03 DIAGNOSIS — R532 Functional quadriplegia: Secondary | ICD-10-CM | POA: Diagnosis not present

## 2021-01-03 DIAGNOSIS — R609 Edema, unspecified: Secondary | ICD-10-CM | POA: Diagnosis not present

## 2021-01-03 DIAGNOSIS — M62830 Muscle spasm of back: Secondary | ICD-10-CM | POA: Diagnosis not present

## 2021-01-03 NOTE — Patient Instructions (Addendum)
It was nice seeing you today!  Take iron every other day instead.  Take Miralax daily to soften stools.  Switch hemorrhoid cream to one with lidocaine.  Take ibuprofen as needed for pain.  Try to place a pillow under one butt cheek and alternate every so often to relieve pressure.  Stay well, Andrew Button, MD [place] [phone]

## 2021-01-03 NOTE — Progress Notes (Signed)
    SUBJECTIVE:   CHIEF COMPLAINT / HPI: Inflamed hemorrhoid  Patient was referred to GI in April 2022 due to perianal mass concerning for hemorrhoids versus colorectal cancer.  He was found to have a 1.6 mm polyp in the transverse colon which was removed and hemorrhoids.  He was also found to have positive H. pylori but did not start treatment until after his recent GI visit on 8/25.  He was seen by Dr. Leighton Ruff from general surgery, found to have significant internal inflammation and grade 2 and 3 hemorrhoids with plan for transanal dearterialization for his hemorrhoids.  Today, patient is asking for something to help with pain.  He states he has not scheduled the surgery but needs to do so.  He is concerned about the need to stay off his bottom for 3 to 4 weeks after surgery.  He reports pain is slightly worsening.  Rectal bleeding has remained about the same.  He is using topical hydrocortisone.  He has not tried anything for pain.  He sometimes has hard stools.  PERTINENT  PMH / PSH: Spastic tetraplegia, hemorrhoids  OBJECTIVE:   BP 100/60   Pulse 65   SpO2 98%   General: Alert, seated in electric wheelchair, NAD CV: RRR, no murmurs Pulm: CTAB, no wheezes or rales Abdomen: Soft, nontender  ASSESSMENT/PLAN:   Iron deficiency anemia Switching iron supplement to every other day to help prevent hard stools.  Perianal mass Has been evaluated by GI and general surgery, found to have grade 2 and 3 hemorrhoids.  Ultimate plan for transanal dearterialization which has not yet been scheduled.  For pain relief, will change topical hydrocortisone to formulation with lidocaine.  Also start MiraLAX to soften stools.  Decreasing iron supplementation to every other day.  He can take OTC meds as needed for pain.     Zola Button, MD [place]

## 2021-01-04 ENCOUNTER — Ambulatory Visit (INDEPENDENT_AMBULATORY_CARE_PROVIDER_SITE_OTHER): Payer: Medicare HMO | Admitting: Family Medicine

## 2021-01-04 ENCOUNTER — Other Ambulatory Visit: Payer: Self-pay

## 2021-01-04 ENCOUNTER — Other Ambulatory Visit: Payer: Self-pay | Admitting: Family Medicine

## 2021-01-04 VITALS — BP 100/60 | HR 65

## 2021-01-04 DIAGNOSIS — D509 Iron deficiency anemia, unspecified: Secondary | ICD-10-CM

## 2021-01-04 DIAGNOSIS — K6289 Other specified diseases of anus and rectum: Secondary | ICD-10-CM | POA: Diagnosis not present

## 2021-01-04 DIAGNOSIS — K644 Residual hemorrhoidal skin tags: Secondary | ICD-10-CM | POA: Diagnosis not present

## 2021-01-04 MED ORDER — FERROUS SULFATE 325 (65 FE) MG PO TABS: 325.0000 mg | ORAL_TABLET | ORAL | 3 refills | Status: DC

## 2021-01-04 MED ORDER — LIDOCAINE-HYDROCORTISONE ACE 1-1 % EX CREA: 1.0000 "application " | TOPICAL_CREAM | Freq: Two times a day (BID) | CUTANEOUS | 2 refills | Status: DC | PRN

## 2021-01-04 MED ORDER — POLYETHYLENE GLYCOL 3350 17 GM/SCOOP PO POWD: 17.0000 g | Freq: Every day | ORAL | 0 refills | Status: DC

## 2021-01-04 NOTE — Assessment & Plan Note (Signed)
Switching iron supplement to every other day to help prevent hard stools.

## 2021-01-04 NOTE — Assessment & Plan Note (Signed)
Has been evaluated by GI and general surgery, found to have grade 2 and 3 hemorrhoids.  Ultimate plan for transanal dearterialization which has not yet been scheduled.  For pain relief, will change topical hydrocortisone to formulation with lidocaine.  Also start MiraLAX to soften stools.  Decreasing iron supplementation to every other day.  He can take OTC meds as needed for pain.

## 2021-01-08 MED ORDER — LIDOCAINE (ANORECTAL) 5 % EX CREA
1.0000 | TOPICAL_CREAM | Freq: Two times a day (BID) | CUTANEOUS | 0 refills | Status: DC | PRN
Start: 2021-01-08 — End: 2021-07-15

## 2021-01-21 DIAGNOSIS — M62830 Muscle spasm of back: Secondary | ICD-10-CM | POA: Diagnosis not present

## 2021-01-21 DIAGNOSIS — G8250 Quadriplegia, unspecified: Secondary | ICD-10-CM | POA: Diagnosis not present

## 2021-01-21 DIAGNOSIS — R532 Functional quadriplegia: Secondary | ICD-10-CM | POA: Diagnosis not present

## 2021-01-21 DIAGNOSIS — R609 Edema, unspecified: Secondary | ICD-10-CM | POA: Diagnosis not present

## 2021-02-04 ENCOUNTER — Other Ambulatory Visit: Payer: Self-pay | Admitting: Family Medicine

## 2021-02-04 DIAGNOSIS — G8250 Quadriplegia, unspecified: Secondary | ICD-10-CM

## 2021-02-04 DIAGNOSIS — R532 Functional quadriplegia: Secondary | ICD-10-CM

## 2021-02-18 ENCOUNTER — Telehealth: Payer: Self-pay

## 2021-02-18 NOTE — Telephone Encounter (Signed)
Received message from [facility] asking for a Rx for a True Metrix Blood Glucose Mtr. Please send Rx if this is appropriate. Ottis Stain, CMA

## 2021-02-25 MED ORDER — TRUE METRIX GO GLUCOSE METER W/DEVICE KIT: PACK | 0 refills | Status: DC

## 2021-02-25 NOTE — Addendum Note (Signed)
Addended by: Alcus Dad on: 02/25/2021 05:05 PM   Modules accepted: Orders

## 2021-03-05 ENCOUNTER — Telehealth: Payer: Self-pay

## 2021-03-05 NOTE — Telephone Encounter (Signed)
New Rx's rcvd for incomplete testing supplies. May we fill new true metrix meter,test strips,and lancets? Test 3 times daily, 90 days supply with 3 refills.  Please resend rx if appropriate. Ottis Stain, CMA

## 2021-03-06 MED ORDER — TRUE METRIX METER DEVI: 0 refills | Status: DC

## 2021-03-06 MED ORDER — TRUE METRIX BLOOD GLUCOSE TEST VI STRP: ORAL_STRIP | 3 refills | Status: DC

## 2021-03-06 MED ORDER — TRUEPLUS LANCETS 30G MISC: 3 refills | Status: DC

## 2021-03-06 NOTE — Telephone Encounter (Signed)
Rx resent to include true metrix meter, test strips, and lancets.

## 2021-03-06 NOTE — Addendum Note (Signed)
Addended by: Alcus Dad on: 03/06/2021 09:36 AM   Modules accepted: Orders

## 2021-03-07 ENCOUNTER — Other Ambulatory Visit: Payer: Self-pay | Admitting: Family Medicine

## 2021-03-07 DIAGNOSIS — R532 Functional quadriplegia: Secondary | ICD-10-CM

## 2021-03-07 DIAGNOSIS — G8250 Quadriplegia, unspecified: Secondary | ICD-10-CM

## 2021-03-07 DIAGNOSIS — Z0100 Encounter for examination of eyes and vision without abnormal findings: Secondary | ICD-10-CM | POA: Diagnosis not present

## 2021-03-07 DIAGNOSIS — H524 Presbyopia: Secondary | ICD-10-CM | POA: Diagnosis not present

## 2021-03-27 ENCOUNTER — Other Ambulatory Visit: Payer: Self-pay

## 2021-03-27 ENCOUNTER — Encounter: Payer: Self-pay | Admitting: Family Medicine

## 2021-03-27 ENCOUNTER — Ambulatory Visit (INDEPENDENT_AMBULATORY_CARE_PROVIDER_SITE_OTHER): Payer: Medicare HMO | Admitting: Family Medicine

## 2021-03-27 VITALS — BP 137/86 | HR 68 | Ht 75.0 in | Wt 169.0 lb

## 2021-03-27 DIAGNOSIS — R532 Functional quadriplegia: Secondary | ICD-10-CM | POA: Diagnosis not present

## 2021-03-27 DIAGNOSIS — K644 Residual hemorrhoidal skin tags: Secondary | ICD-10-CM | POA: Diagnosis not present

## 2021-03-27 DIAGNOSIS — G8250 Quadriplegia, unspecified: Secondary | ICD-10-CM

## 2021-03-27 MED ORDER — HYDROCORTISONE 1 % EX CREA: TOPICAL_CREAM | CUTANEOUS | 1 refills | Status: DC

## 2021-03-27 MED ORDER — BACLOFEN 20 MG PO TABS: 20.0000 mg | ORAL_TABLET | Freq: Four times a day (QID) | ORAL | 1 refills | Status: DC

## 2021-03-27 NOTE — Patient Instructions (Signed)
Thank you for coming to see me today. It was a pleasure.    Referral sent for physical therapy on [place]  Baclofen and Hydrocortisone refill  Please follow-up with PCP as needed  If you have any questions or concerns, please do not hesitate to call the office at [phone].  Best,   Carollee Leitz, MD

## 2021-03-27 NOTE — Progress Notes (Signed)
° ° °  SUBJECTIVE:   CHIEF COMPLAINT / HPI: med refil and referral for PT  No acute concerns today  Requesting refill for chronic use of baclofen and referral for PT.    Requesting refill for external hemorrhoids. Seen by Gi.  Grade III hemorrhoids.  Continues to have pain at site.   PERTINENT  PMH / PSH:  Cervical spine # 1988  OBJECTIVE:   BP 137/86    Pulse 68    Ht 6\' 3"  (1.905 m)    Wt 169 lb (76.7 kg)    SpO2 98%    BMI 21.12 kg/m    General: Alert, no acute distress Cardio: Normal S1 and S2, RRR, no r/m/g Pulm: CTAB, normal work of breathing Abdomen: Bowel sounds normal. Abdomen soft and non-tender.  Extremities: No peripheral edema. Functional quadrapelegia    ASSESSMENT/PLAN:   Spastic tetraplegia (HCC) Refill Baclofen  Functional quadriplegia (HCC) Referal to PT  Inflamed external hemorrhoid Refill Hydrocortisone cream     Carollee Leitz, MD [place]

## 2021-03-30 ENCOUNTER — Encounter: Payer: Self-pay | Admitting: Family Medicine

## 2021-03-30 NOTE — Assessment & Plan Note (Signed)
Referal to PT

## 2021-03-30 NOTE — Assessment & Plan Note (Signed)
Refill Hydrocortisone cream

## 2021-03-30 NOTE — Assessment & Plan Note (Signed)
Refill Baclofen

## 2021-03-31 IMAGING — CR DG CHEST 2V
2 series · 3 of 3 positions shown · non-contrast
Comparison: Two-view chest x-ray 04/14/2019

CLINICAL DATA: Chest pain for 2 weeks.

EXAM:
CHEST - 2 VIEW

[chest lat]
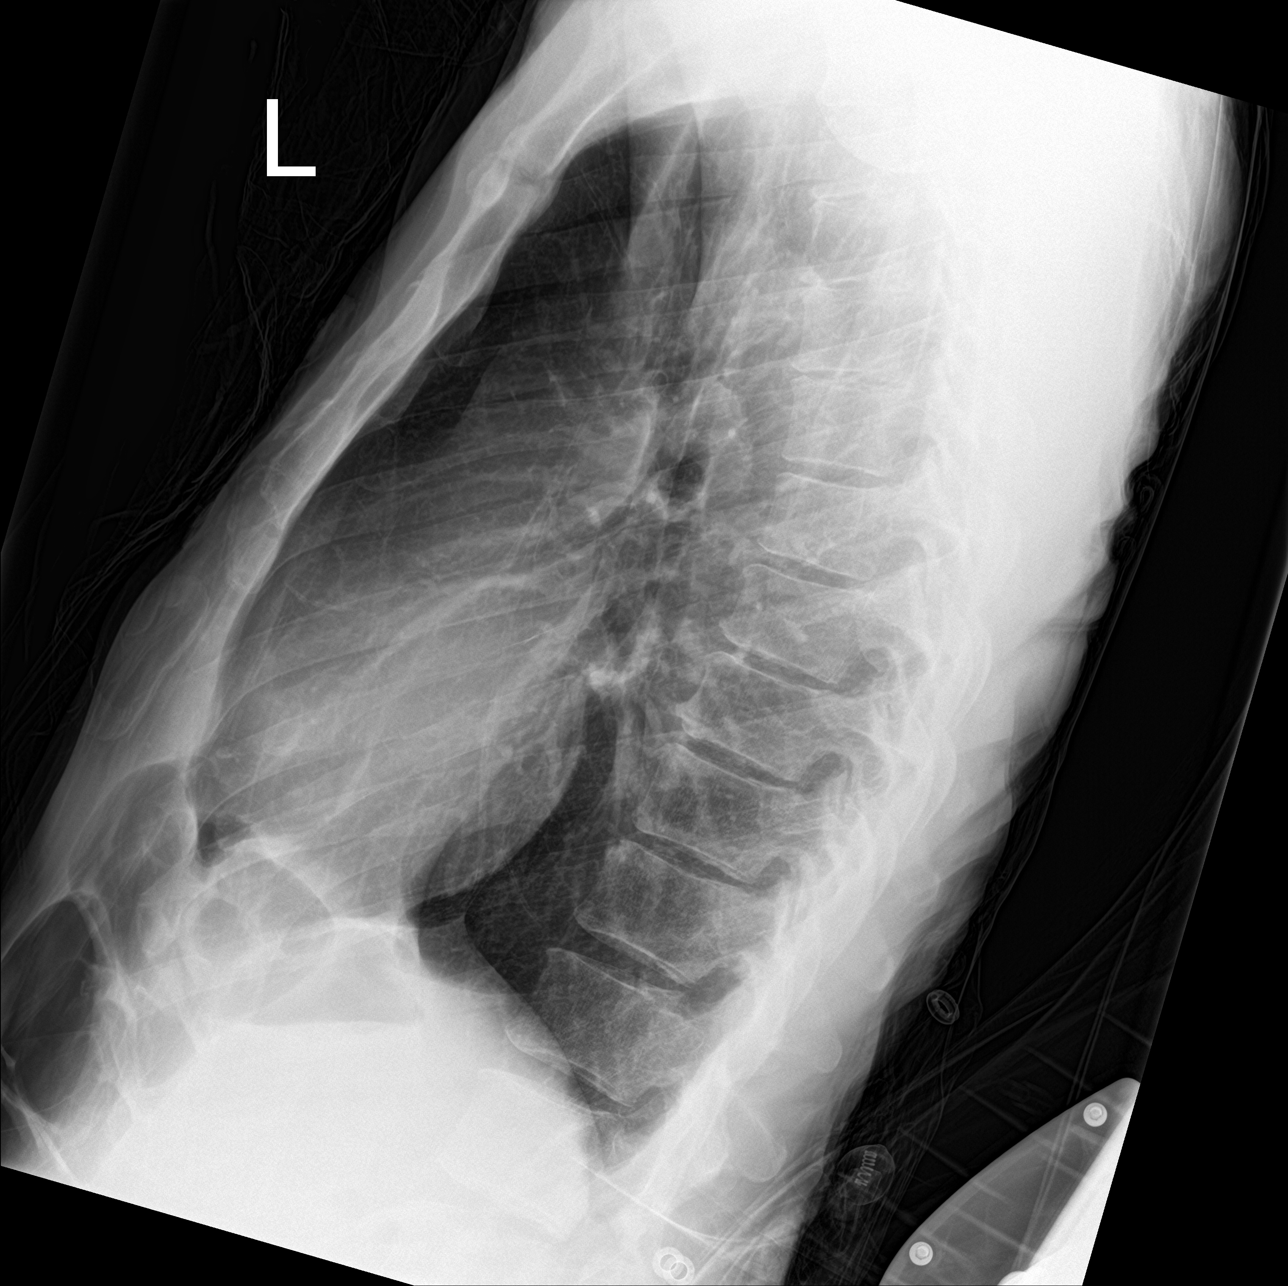

[Series 3: chest ap · 0.14mm/px · 2 of 2 slices shown]
[im 1/2]
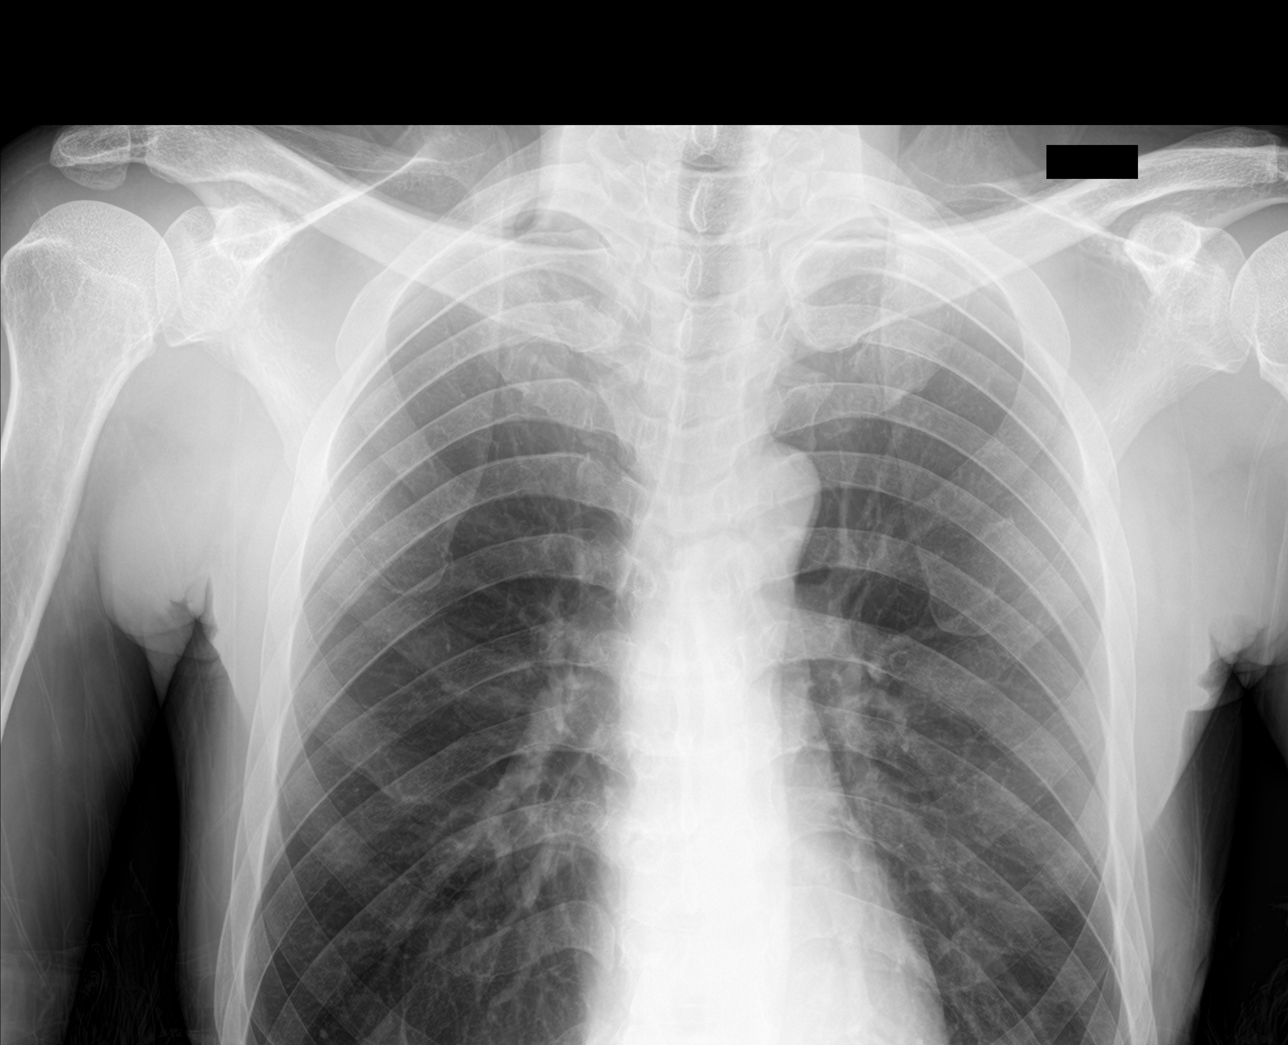
[im 2/2]
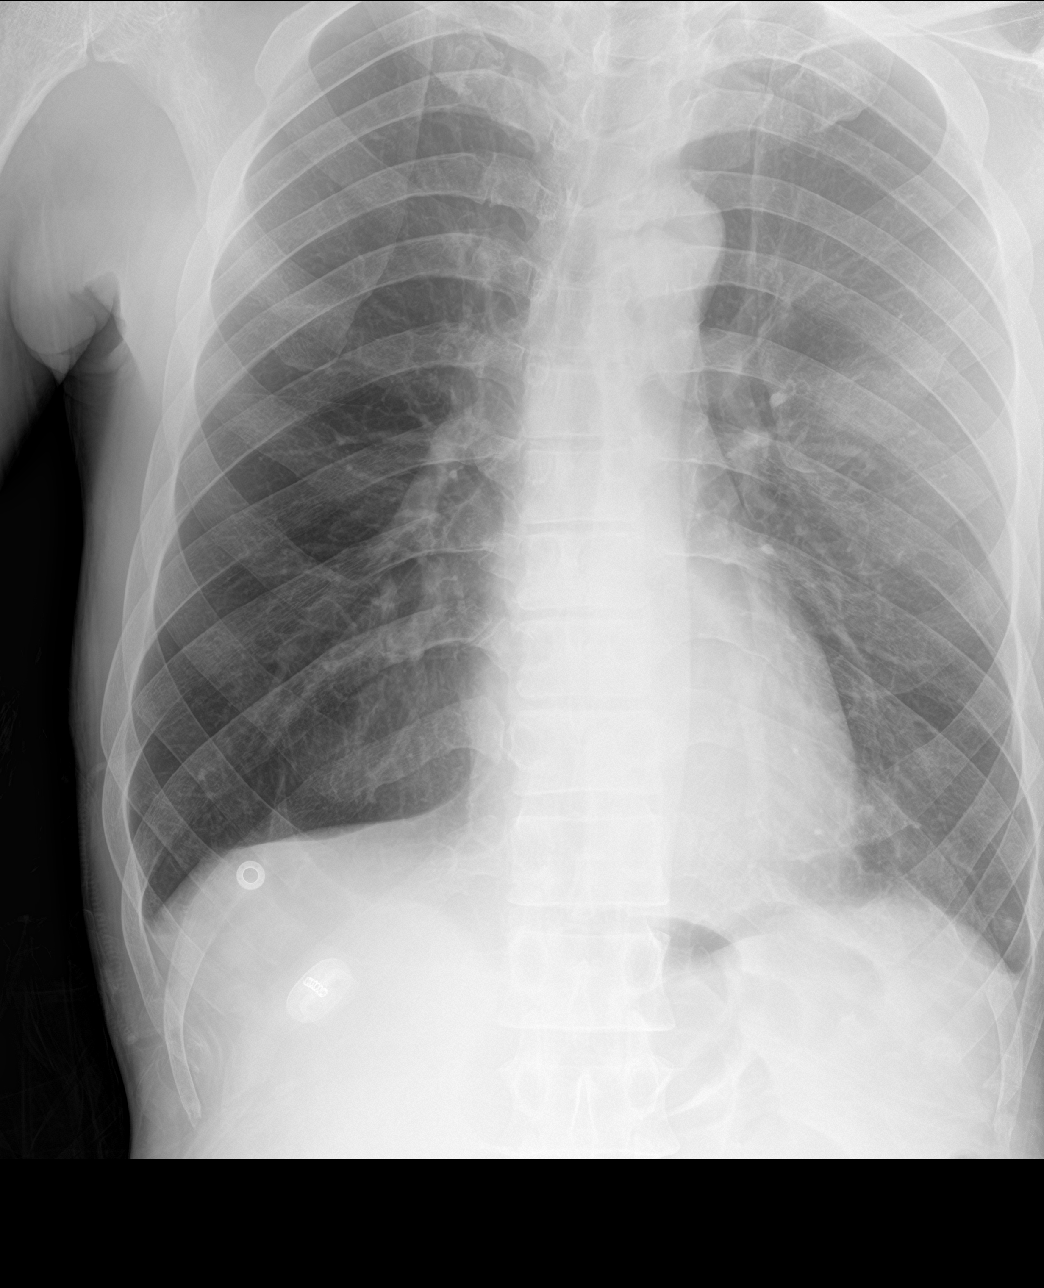

[3 of 3 positions shown; findings below may reference images not displayed]

FINDINGS: Heart size is normal. Changes of COPD are noted. No edema or
effusion is present. No focal airspace disease is present.
IMPRESSION: 1. No acute cardiopulmonary disease.
2. Changes of COPD.

## 2021-04-17 ENCOUNTER — Ambulatory Visit: Payer: Medicare HMO

## 2021-04-17 ENCOUNTER — Other Ambulatory Visit: Payer: Self-pay

## 2021-04-17 ENCOUNTER — Ambulatory Visit: Payer: Medicare HMO | Attending: Family Medicine

## 2021-04-17 DIAGNOSIS — M62838 Other muscle spasm: Secondary | ICD-10-CM | POA: Diagnosis not present

## 2021-04-17 DIAGNOSIS — G8250 Quadriplegia, unspecified: Secondary | ICD-10-CM | POA: Diagnosis not present

## 2021-04-17 DIAGNOSIS — R262 Difficulty in walking, not elsewhere classified: Secondary | ICD-10-CM | POA: Insufficient documentation

## 2021-04-17 DIAGNOSIS — R293 Abnormal posture: Secondary | ICD-10-CM | POA: Diagnosis not present

## 2021-04-17 NOTE — Therapy (Signed)
OUTPATIENT PHYSICAL THERAPY NEURO EVALUATION   Patient Name: Andrew Sullivan MRN: [id number] DOB:[birth year], 51 y.o., male Today's Date: 04/17/2021  PCP: Alcus Dad, MD REFERRING PROVIDER: Lenoria Chime, MD    Past Medical History:  Diagnosis Date   Anemia    Erectile dysfunction    Gross hematuria    History of recurrent UTIs    History of spinal cord injury    10 (age 18 fell from ladder) w/ cervical spine fx  s/p  c4 -- c6  fusion--  residual paraplegic bilateral legs (great below T8 uses w/c and can transfer self   Paraparesis of both lower limbs ([place])    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 51 in 60   Spastic tetraplegia [facility])    Past Surgical History:  Procedure Laterality Date   BIOPSY  06/26/2020   Procedure: BIOPSY;  Surgeon: Jerene Bears, MD;  [facility];  Service: Gastroenterology;;  EGD and COLON   CERVICAL FUSION  1988   C4 -- C6 w/ spinal cord injury   COLONOSCOPY WITH PROPOFOL N/A 06/26/2020   Procedure: COLONOSCOPY WITH PROPOFOL;  Surgeon: Jerene Bears, MD;  [facility];  Service: Gastroenterology;  Laterality: N/A;   CYSTOSCOPY WITH RETROGRADE PYELOGRAM, URETEROSCOPY AND STENT PLACEMENT Left 12/27/2013   Procedure: CYSTOSCOPY WITH RETROGRADE PYELOGRAM, URETEROSCOPY AND STENT PLACEMENT;  Surgeon: Reece Packer, MD;  Location: [place];  Service: Urology;  Laterality: Left;   CYSTOSCOPY/RETROGRADE/URETEROSCOPY Left 01/09/2014   Procedure: CYSTOSCOPY/RETROGRADE/URETEROSCOPY, URETERAL BIOPSY AND STENT EXCHANGE;  Surgeon: Alexis Frock, MD;  [facility];  Service: Urology;  Laterality: Left;   ESOPHAGOGASTRODUODENOSCOPY (EGD) WITH PROPOFOL N/A 06/26/2020   Procedure: ESOPHAGOGASTRODUODENOSCOPY (EGD) WITH PROPOFOL;  Surgeon: Jerene Bears, MD;  [facility];  Service: Gastroenterology;  Laterality: N/A;   HIP ARTHROPLASTY  08/12/2011   Procedure: ARTHROPLASTY BIPOLAR HIP;  Surgeon: Mauri Pole, MD;  [facility];  Service: Orthopedics;  Laterality: Left;  Hemi-arthroplasty   POLYPECTOMY  06/26/2020   Procedure: POLYPECTOMY;  Surgeon: Jerene Bears, MD;  [facility];  Service: Gastroenterology;;   Patient Active Problem List   Diagnosis Date Noted   Inflamed external hemorrhoid 01/04/2021   Abnormal vision screen 09/25/2020   Abnormal digital rectal exam    Benign neoplasm of transverse colon    Anal polyp    Acute gastritis without hemorrhage    Perianal mass 06/07/2020   Iron deficiency anemia 06/07/2020   Symptomatic anemia 05/13/2019   DVT (deep venous thrombosis) ([place]) 10/19/2018   Erectile dysfunction    Spastic tetraplegia ([place]) 12/06/2013   History of recurrent UTIs 05/19/2010   Functional quadriplegia ([place]) 05/10/2009    ONSET DATE:  Past Medical History:                      History of spinal cord injury    82 (age 82 fell from ladder) w/ cervical spine fx  s/p  c4 -- c6  fusion--  residual paraplegic bilateral legs (great below T8 uses w/c and can transfer self   Paraparesis of both lower limbs ([place])    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 46 in 66   Spastic tetraplegia ([place])     REFERRING DIAG: R53.2 (ICD-10-CM) - Functional quadriplegia ([facility]) G82.50 (ICD-10-CM) - Spastic tetraplegia ([place])   THERAPY DIAG:  Chronic spastic tetraplegia ([place])  Difficulty in walking, not elsewhere classified  Abnormal posture  SUBJECTIVE:   SUBJECTIVE:  Returns  to OPPT due to increased hamstring tightness and tome related to his Dx.                                                                                                           PERTINENT HISTORY: Prior OPPT for gait training, mobility skills and HEP  PAIN:  Are you having pain? No NPRS scale: 7-8/10 Pain location: Hamstrings Pain orientation: Bilateral  PAIN TYPE: aching and tight Pain description: intermittent  Aggravating factors: undetermined Relieving factors:  stretching  PRECAUTIONS: Fall  WEIGHT BEARING RESTRICTIONS No  FALLS: Has patient fallen in last 6 months? Yes, Number of falls: 1  LIVING ENVIRONMENT: Lives with: lives with their family Lives in: House/apartment WC bound  PLOF: Independent with basic ADLs, Independent with community mobility with device, Independent with transfers, and Requires assistive device for independence  PATIENT GOALS: To loosen my hamstring tightness to regain my transfer and ambulation ability  OBJECTIVE:   DIAGNOSTIC FINDINGS: no changes  COGNITION: Overall cognitive status: Within functional limits for tasks assessed  SENSATION: Light touch: Appears intact  EDEMA:  None noted  MUSCLE TONE: Increased BLE tone especially in hamstrings   MUSCLE LENGTH: Hamstrings: Right 60 deg; Left 60 deg  DTRs:  Not assessed  POSTURE: rounded shoulders, forward head, decreased lumbar lordosis, increased thoracic kyphosis, and posterior pelvic tilt  AROM/PROM:  A/PROM Right 04/17/2021 Left 04/17/2021  Hip flexion    Hip extension    Hip abduction    Hip adduction    Hip internal rotation    Hip external rotation    Knee flexion    Knee extension    Ankle dorsiflexion    Ankle plantarflexion    Ankle inversion    Ankle eversion     (Blank rows = not tested) MMT:  MMT Right 04/17/2021 Left 04/17/2021  Hip flexion    Hip extension    Hip abduction    Hip adduction    Hip internal rotation    Hip external rotation    Knee flexion    Knee extension    Ankle dorsiflexion    Ankle plantarflexion    Ankle inversion    Ankle eversion    Not assessed due to tone  BED MOBILITY:  Sit to supine Modified independence Supine to sit Modified independence Rolling to Right Modified independence Rolling to Left Modified independence  TRANSFERS: Assistive device utilized: None  Sit to stand: SBA Stand to sit: SBA Chair to chair: Modified independence Floor:  not tested  RAMP  power  WC   GAIT: Gait pattern: Right steppage, Left steppage, scissoring, ataxic, narrow BOS, poor foot clearance- Right, and poor foot clearance- Left Distance walked: 10 Assistive device utilized: Walker - 2 wheeled Level of assistance: Min A Comments: Steppage gait with decreased B DF as well as hip flexion and knee extension  FUNCTIONAL TESTs:  5 times sit to stand: 50s  PATIENT SURVEYS:  FOTO 58  TODAY'S TREATMENT:  evaluation   PATIENT EDUCATION: Education details: Discussed eval findings, rehab rationale and POC and  patient is in agreement  Person educated: Patient Education method: Explanation Education comprehension: verbalized understanding   HOME EXERCISE PROGRAM: TBD  ASSESSMENT:  CLINICAL IMPRESSION: Patient is a 51 y.o. male who was seen today for physical therapy evaluation and treatment for spastic quadruplegia and resulting in decrease transfer and mobility skills as well as discomfort.  Previous bouts of OPPT have provided lasting relief of symptoms and have improved his function.  Patient can ambulate room to room distances but expends a good deal of energy limiting the distance traveled.  He uses a power chair for out of home mobility needs. Objective impairments include Abnormal gait, decreased balance, decreased mobility, difficulty walking, increased muscle spasms, and impaired flexibility. These impairments are limiting patient from community activity and fitness routine . Personal factors including Past/current experiences, Time since onset of injury/illness/exacerbation, and 1 comorbidity: spastic quadruplegia.  are also affecting patient's functional outcome. Patient will benefit from skilled PT to address above impairments and improve overall function.  REHAB POTENTIAL: Good  CLINICAL DECISION MAKING: Evolving/moderate complexity  EVALUATION COMPLEXITY: Moderate   GOALS: Goals reviewed with patient? Yes  SHORT TERM GOALS:  STG Name Target Date Goal  status  1 Patient to demonstrate independence in HEP Baseline: TBD 05/01/2021 INITIAL  2 Assess functional reach test to determine sitting balance deficits Baseline: TBD 05/01/2021 INITIAL  LONG TERM GOALS:   LTG Name Target Date Goal status  1 Decrease 5x STS time to 40s Baseline: 50s 05/15/2021 INITIAL  2 Increase hamstring flexibility to 80d SLR B Baseline: 60d SLR B 05/15/2021 INITIAL  3 Assess functional reach test goal Baseline:TBD 05/15/2021 INITIAL  4 Improve WC/chair transfer to S Baseline: WC/chair transfers require SBA 05/15/2021 INITIAL  PLAN: PT FREQUENCY: 2x/week  PT DURATION: 4 weeks  PLANNED INTERVENTIONS: Therapeutic exercises, Therapeutic activity, Neuro Muscular re-education, Balance training, Gait training, Patient/Family education, Joint mobilization, and Manual therapy  PLAN FOR NEXT SESSION: establish HEP, B hamstring stretches, manual techniques to minimize tone in B hamstrings   Lanice Shirts, PT 04/17/2021, 3:53 PM  Referring diagnosis? Spastic quadruplegia Treatment diagnosis? (if different than referring diagnosis) same What was this (referring dx) caused by? []  Surgery []  Fall [x]  Ongoing issue []  Arthritis []  Other: ____________  Laterality: []  Rt []  Lt [x]  Both  Check all possible CPT codes:  *CHOOSE 10 OR LESS*    [x]  97110 (Therapeutic Exercise)  []  92507 (SLP Treatment)  [x]  97112 (Neuro Re-ed)   []  69678 (Swallowing Treatment)   [x]  97116 (Gait Training)   []  [id number] (Cognitive Training, 1st 15 minutes) [x]  97140 (Manual Therapy)   []  97130 (Cognitive Training, each add'l 15 minutes)  []  97530 (Therapeutic Activities)  []  Other, List CPT Code ____________    []  97535 (Self Care)       []  All codes above (97110 - 97535)  []  97012 (Mechanical Traction)  []  97014 (E-stim Unattended)  []  97032 (E-stim manual)  []  97033 (Ionto)  []  97035 (Ultrasound)  []  97760 (Orthotic Fit) []  [id number] (Physical Performance Training) []  [id number] (Aquatic  Therapy) []  97034 (Contrast Bath) []  [id number] (Paraffin) []  97597 (Wound Care 1st 20 sq cm) []  97598 (Wound Care each add'l 20 sq cm) []  97016 (Vasopneumatic Device) []  [id number] Comptroller) []  [id number] (Prosthetic Training)

## 2021-04-24 ENCOUNTER — Other Ambulatory Visit: Payer: Self-pay

## 2021-04-24 ENCOUNTER — Ambulatory Visit: Payer: Medicare HMO

## 2021-04-24 DIAGNOSIS — R262 Difficulty in walking, not elsewhere classified: Secondary | ICD-10-CM | POA: Diagnosis not present

## 2021-04-24 DIAGNOSIS — G8250 Quadriplegia, unspecified: Secondary | ICD-10-CM | POA: Diagnosis not present

## 2021-04-24 DIAGNOSIS — R293 Abnormal posture: Secondary | ICD-10-CM

## 2021-04-24 DIAGNOSIS — M62838 Other muscle spasm: Secondary | ICD-10-CM

## 2021-04-24 NOTE — Therapy (Signed)
OUTPATIENT PHYSICAL THERAPY TREATMENT NOTE   Patient Name: Andrew Sullivan MRN: [id number] DOB:22-Dec-1970, 51 y.o., male Today's Date: 04/24/2021  PCP: Alcus Dad, MD REFERRING PROVIDER: Alcus Dad, MD   PT End of Session - 04/24/21 1558     Visit Number 2    Number of Visits 8    Date for PT Re-Evaluation 05/22/21    Authorization Type [employer] MCR    Authorization Time Period 04/17/21-05/22/21    Progress Note Due on Visit 8    PT Start Time 1615    PT Stop Time 1700    PT Time Calculation (min) 45 min    Activity Tolerance Patient tolerated treatment well    Behavior During Therapy WFL for tasks assessed/performed             Past Medical History:  Diagnosis Date   Anemia    Erectile dysfunction    Gross hematuria    History of recurrent UTIs    History of spinal cord injury    11 (age 40 fell from ladder) w/ cervical spine fx  s/p  c4 -- c6  fusion--  residual paraplegic bilateral legs (great below T8 uses w/c and can transfer self   Paraparesis of both lower limbs ([facility])    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 58 in 82   Spastic tetraplegia [facility])    Past Surgical History:  Procedure Laterality Date   BIOPSY  06/26/2020   Procedure: BIOPSY;  Surgeon: Jerene Bears, MD;  [facility];  Service: Gastroenterology;;  EGD and COLON   CERVICAL FUSION  1988   C4 -- C6 w/ spinal cord injury   COLONOSCOPY WITH PROPOFOL N/A 06/26/2020   Procedure: COLONOSCOPY WITH PROPOFOL;  Surgeon: Jerene Bears, MD;  [facility];  Service: Gastroenterology;  Laterality: N/A;   CYSTOSCOPY WITH RETROGRADE PYELOGRAM, URETEROSCOPY AND STENT PLACEMENT Left 12/27/2013   Procedure: CYSTOSCOPY WITH RETROGRADE PYELOGRAM, URETEROSCOPY AND STENT PLACEMENT;  Surgeon: Reece Packer, MD;  Location: [place];  Service: Urology;  Laterality: Left;   CYSTOSCOPY/RETROGRADE/URETEROSCOPY Left 01/09/2014   Procedure:  CYSTOSCOPY/RETROGRADE/URETEROSCOPY, URETERAL BIOPSY AND STENT EXCHANGE;  Surgeon: Alexis Frock, MD;  [facility];  Service: Urology;  Laterality: Left;   ESOPHAGOGASTRODUODENOSCOPY (EGD) WITH PROPOFOL N/A 06/26/2020   Procedure: ESOPHAGOGASTRODUODENOSCOPY (EGD) WITH PROPOFOL;  Surgeon: Jerene Bears, MD;  [facility];  Service: Gastroenterology;  Laterality: N/A;   HIP ARTHROPLASTY  08/12/2011   Procedure: ARTHROPLASTY BIPOLAR HIP;  Surgeon: Mauri Pole, MD;  [facility];  Service: Orthopedics;  Laterality: Left;  Hemi-arthroplasty   POLYPECTOMY  06/26/2020   Procedure: POLYPECTOMY;  Surgeon: Jerene Bears, MD;  Location: [facility];  Service: Gastroenterology;;   Patient Active Problem List   Diagnosis Date Noted   Inflamed external hemorrhoid 01/04/2021   Abnormal vision screen 09/25/2020   Abnormal digital rectal exam    Benign neoplasm of transverse colon    Anal polyp    Acute gastritis without hemorrhage    Perianal mass 06/07/2020   Iron deficiency anemia 06/07/2020   Symptomatic anemia 05/13/2019   DVT (deep venous thrombosis) ([place]) 10/19/2018   Erectile dysfunction    Spastic tetraplegia ([place]) 12/06/2013   History of recurrent UTIs 05/19/2010   Functional quadriplegia ([place]) 05/10/2009    REFERRING DIAG: spastic tetraplegia  THERAPY DIAG:  Chronic spastic tetraplegia ([facility])  Difficulty in walking, not elsewhere classified  Abnormal posture  Other muscle spasm  PERTINENT HISTORY: Returns to [employer]  due to increased hamstring tightness and tome related to his Dx.            PRECAUTIONS: fall  SUBJECTIVE: No changes to report  PAIN:  Are you having pain? No  OBJECTIVE:    DIAGNOSTIC FINDINGS: no changes   COGNITION: Overall cognitive status: Within functional limits for tasks assessed   SENSATION: Light touch: Appears intact   EDEMA:  None noted   MUSCLE TONE: Increased BLE tone especially in hamstrings     MUSCLE  LENGTH: Hamstrings: Right 60 deg; Left 60 deg   DTRs:  Not assessed   POSTURE: rounded shoulders, forward head, decreased lumbar lordosis, increased thoracic kyphosis, and posterior pelvic tilt   AROM/PROM:   PROM Right 04/17/2021 Left 04/17/2021  Hip flexion  [facility]  WFL  Hip extension 10d   10d  Hip abduction      Hip adduction      Hip internal rotation      Hip external rotation      Knee flexion  135d 135d   Knee extension  0d  0d  Ankle dorsiflexion  0d  0d  Ankle plantarflexion  [facility]  WFL  Ankle inversion      Ankle eversion       (Blank rows = not tested) MMT:   MMT Right 04/17/2021 Left 04/17/2021  Hip flexion      Hip extension      Hip abduction      Hip adduction      Hip internal rotation      Hip external rotation      Knee flexion      Knee extension      Ankle dorsiflexion      Ankle plantarflexion      Ankle inversion      Ankle eversion      Not assessed due to tone   BED MOBILITY:  Sit to supine Modified independence Supine to sit Modified independence Rolling to Right Modified independence Rolling to Left Modified independence   TRANSFERS: Assistive device utilized: None  Sit to stand: SBA Stand to sit: SBA Chair to chair: Modified independence Floor:  not tested   RAMP  power WC     GAIT: Gait pattern: Right steppage, Left steppage, scissoring, ataxic, narrow BOS, poor foot clearance- Right, and poor foot clearance- Left Distance walked: 10 Assistive device utilized: Environmental consultant - 2 wheeled Level of assistance: Min A Comments: Steppage gait with decreased B DF as well as hip flexion and knee extension   FUNCTIONAL TESTs:  5 times sit to stand: 50s   PATIENT SURVEYS:  FOTO 58   TODAY'S TREATMENT:  [facility] Adult PT Treatment:                                                DATE: 04/24/21 Therapeutic Exercise: FIST test  LTR over ball  AAROM hamstring curls in prone 10x B Manual Therapy: B hamstring releases B adductor stretch       PATIENT EDUCATION: Education details: Discussed eval findings, rehab rationale and POC and patient is in agreement  Person educated: Patient Education method: Explanation Education comprehension: verbalized understanding     HOME EXERCISE PROGRAM: TBD   ASSESSMENT:   CLINICAL IMPRESSION: No changes since last session.  LE hamstring and adductor tone reduced with treatment with patient demonstrating ability to transfer  back to [facility] with less effort and more fluid technique.  FIST score 41/44, no marked deficits noted other than ability to recover from SB w/o UE support  REHAB POTENTIAL: Good   CLINICAL DECISION MAKING: Evolving/moderate complexity   EVALUATION COMPLEXITY: Moderate     GOALS: Goals reviewed with patient? Yes   SHORT TERM GOALS:   STG Name Target Date Goal status  1 Patient to demonstrate independence in HEP Baseline: TBD 05/01/2021 INITIAL  2 Assess functional reach test to determine sitting balance deficits Baseline: 41/44 05/01/2021 Goal met  LONG TERM GOALS:    LTG Name Target Date Goal status  1 Decrease 5x STS time to 40s Baseline: 50s 05/15/2021 INITIAL  2 Increase hamstring flexibility to 80d SLR B Baseline: 60d SLR B 05/15/2021 INITIAL  3 Assess functional reach test goal Baseline:TBD 05/15/2021 INITIAL  4 Improve WC/chair transfer to S Baseline: WC/chair transfers require SBA 05/15/2021 INITIAL  PLAN: PT FREQUENCY: 2x/week   PT DURATION: 4 weeks   PLANNED INTERVENTIONS: Therapeutic exercises, Therapeutic activity, Neuro Muscular re-education, Balance training, Gait training, Patient/Family education, Joint mobilization, and Manual therapy   PLAN FOR NEXT SESSION: establish HEP, B hamstring stretches, manual techniques to minimize tone in B hamstrings    Lanice Shirts, PT 04/24/2021, 4:00 PM

## 2021-04-26 ENCOUNTER — Ambulatory Visit: Payer: Medicare HMO

## 2021-04-26 ENCOUNTER — Other Ambulatory Visit: Payer: Self-pay

## 2021-04-26 DIAGNOSIS — M62838 Other muscle spasm: Secondary | ICD-10-CM | POA: Diagnosis not present

## 2021-04-26 DIAGNOSIS — R293 Abnormal posture: Secondary | ICD-10-CM | POA: Diagnosis not present

## 2021-04-26 DIAGNOSIS — G8250 Quadriplegia, unspecified: Secondary | ICD-10-CM | POA: Diagnosis not present

## 2021-04-26 DIAGNOSIS — R262 Difficulty in walking, not elsewhere classified: Secondary | ICD-10-CM | POA: Diagnosis not present

## 2021-04-26 NOTE — Therapy (Signed)
°OUTPATIENT PHYSICAL THERAPY TREATMENT NOTE ° ° °Patient Name: Andrew Sullivan °MRN: [id number] °DOB:01/22/1971, 50 y.o., male °Today's Date: 04/26/2021 ° °PCP: Wells, Ashleigh, MD °REFERRING PROVIDER: Pray, Margaret E, MD ° ° PT End of Session - 04/26/21 1354   ° ° Visit Number 3   ° Number of Visits 8   ° Date for PT Re-Evaluation 05/22/21   ° Authorization Type [employer] M[employer]   ° Authorization Time Period 04/17/21-05/22/21   ° Progress Note Due on Visit 8   ° PT Start Time 1345   ° PT Stop Time 1430   ° PT Time Calculation (min) 45 min   ° Activity Tolerance Patient tolerated treatment well   ° Behavior During Therapy WFL for tasks assessed/performed   ° °  °  ° °  ° ° °Past Medical History:  °Diagnosis Date  ° Anemia   ° Erectile dysfunction   ° Gross hematuria   ° History of recurrent UTIs   ° History of spinal cord injury   ° 1988 (age 16 fell from ladder) w/ cervical spine fx  s/p  c4 -- c6  fusion--  residual paraplegic bilateral legs (great below T8 uses w/c and can transfer self  ° Paraparesis of both lower limbs (HCC)   ° 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 16 in 1988  ° Spastic tetraplegia (HCC)   ° °Past Surgical History:  °Procedure Laterality Date  ° BIOPSY  06/26/2020  ° Procedure: BIOPSY;  Surgeon: Pyrtle, Jay M, MD;  Location: [facility];  Service: Gastroenterology;;  EGD and COLON  ° CERVICAL FUSION  1988  ° C4 -- C6 w/ spinal cord injury  ° COLONOSCOPY WITH PROPOFOL N/A 06/26/2020  ° Procedure: COLONOSCOPY WITH PROPOFOL;  Surgeon: Pyrtle, Jay M, MD;  Location: [facility];  Service: Gastroenterology;  Laterality: N/A;  ° CYSTOSCOPY WITH RETROGRADE PYELOGRAM, URETEROSCOPY AND STENT PLACEMENT Left 12/27/2013  ° Procedure: CYSTOSCOPY WITH RETROGRADE PYELOGRAM, URETEROSCOPY AND STENT PLACEMENT;  Surgeon: [name] A MacDiarmid, MD;  Location: [facility];  Service: Urology;  Laterality: Left;  ° CYSTOSCOPY/RETROGRADE/URETEROSCOPY Left 01/09/2014  ° Procedure:  CYSTOSCOPY/RETROGRADE/URETEROSCOPY, URETERAL BIOPSY AND STENT EXCHANGE;  Surgeon: Theodore Manny, MD;  Location: [facility];  Service: Urology;  Laterality: Left;  ° ESOPHAGOGASTRODUODENOSCOPY (EGD) WITH PROPOFOL N/A 06/26/2020  ° Procedure: ESOPHAGOGASTRODUODENOSCOPY (EGD) WITH PROPOFOL;  Surgeon: Pyrtle, Jay M, MD;  Location: [facility];  Service: Gastroenterology;  Laterality: N/A;  ° HIP ARTHROPLASTY  08/12/2011  ° Procedure: ARTHROPLASTY BIPOLAR HIP;  Surgeon: Matthew D Olin, MD;  Location: [facility];  Service: Orthopedics;  Laterality: Left;  Hemi-arthroplasty  ° POLYPECTOMY  06/26/2020  ° Procedure: POLYPECTOMY;  Surgeon: Pyrtle, Jay M, MD;  Location: [facility];  Service: Gastroenterology;;  ° °Patient Active Problem List  ° Diagnosis Date Noted  ° Inflamed external hemorrhoid 01/04/2021  ° Abnormal vision screen 09/25/2020  ° Abnormal digital rectal exam   ° Benign neoplasm of transverse colon   ° Anal polyp   ° Acute gastritis without hemorrhage   ° Perianal mass 06/07/2020  ° Iron deficiency anemia 06/07/2020  ° Symptomatic anemia 05/13/2019  ° DVT (deep venous thrombosis) (HCC) 10/19/2018  ° Erectile dysfunction   ° Spastic tetraplegia (HCC) 12/06/2013  ° History of recurrent UTIs 05/19/2010  ° Functional quadriplegia (HCC) 05/10/2009  ° ° °REFERRING DIAG: spastic tetraplegia ° °THERAPY DIAG:  °Chronic spastic tetraplegia (HCC) ° °Difficulty in walking, not elsewhere classified ° °Abnormal posture ° °PERTINENT HISTORY: Returns to OPPT due to increased   increased hamstring tightness and tone related to his Dx.            PRECAUTIONS: fall  SUBJECTIVE: Feels less tone and spasticity today and felt last sessions techniques were beneficial.  PAIN:  Are you having pain? No  OBJECTIVE:    DIAGNOSTIC FINDINGS: no changes   COGNITION: Overall cognitive status: Within functional limits for tasks assessed   SENSATION: Light touch: Appears intact   EDEMA:  None noted   MUSCLE TONE: Increased BLE tone especially  in hamstrings     MUSCLE LENGTH: Hamstrings: Right 60 deg; Left 60 deg   DTRs:  Not assessed   POSTURE: rounded shoulders, forward head, decreased lumbar lordosis, increased thoracic kyphosis, and posterior pelvic tilt   AROM/PROM:   PROM Right 04/17/2021 Left 04/17/2021  Hip flexion  [facility]  WFL  Hip extension 10d   10d  Hip abduction      Hip adduction      Hip internal rotation      Hip external rotation      Knee flexion  135d 135d   Knee extension  0d  0d  Ankle dorsiflexion  0d  0d  Ankle plantarflexion  [facility]  WFL  Ankle inversion      Ankle eversion       (Blank rows = not tested) MMT:   MMT Right 04/17/2021 Left 04/17/2021  Hip flexion      Hip extension      Hip abduction      Hip adduction      Hip internal rotation      Hip external rotation      Knee flexion      Knee extension      Ankle dorsiflexion      Ankle plantarflexion      Ankle inversion      Ankle eversion      Not assessed due to tone   BED MOBILITY:  Sit to supine Modified independence Supine to sit Modified independence Rolling to Right Modified independence Rolling to Left Modified independence   TRANSFERS: Assistive device utilized: None  Sit to stand: SBA Stand to sit: SBA Chair to chair: Modified independence Floor:  not tested   RAMP  power WC     GAIT: Gait pattern: Right steppage, Left steppage, scissoring, ataxic, narrow BOS, poor foot clearance- Right, and poor foot clearance- Left Distance walked: 10 Assistive device utilized: Environmental consultant - 2 wheeled Level of assistance: Min A Comments: Steppage gait with decreased B DF as well as hip flexion and knee extension   FUNCTIONAL TESTs:  5 times sit to stand: 50s   PATIENT SURVEYS:  FOTO 58   TODAY'S TREATMENT:   [facility] Adult PT Treatment:                                                DATE: 04/26/21 Therapeutic Exercise: Prone ham curls 15x B Supine hip fallouts 10x each with manual facilitation as needed Seated balance  challenges 10x BWD/SB allowing for as much range to control return independently Supine curl ups with knees held in apart to counter tone Manual Therapy: STM to B hamstrings, 8 min  [facility] Adult PT Treatment:  DATE: 04/24/21 °Therapeutic Exercise: °FIST test  °LTR over ball  °AAROM hamstring curls in prone 10x B °Manual Therapy: °B hamstring releases °B adductor stretch ° °  °  °PATIENT EDUCATION: °Education details: Discussed eval findings, rehab rationale and POC and patient is in agreement  °Person educated: Patient °Education method: Explanation °Education comprehension: verbalized understanding °  °  °HOME EXERCISE PROGRAM: °TBD °  °ASSESSMENT: °  °CLINICAL IMPRESSION: Returns to PT and reports less tone and discomfort b/t sessions, improved participation in PKBs onace allowed to push through tone and able to control concentric and eccentric directions.  Less soft tissue restrictions felt in hamstrings during STM. ° °REHAB POTENTIAL: Good °  °CLINICAL DECISION MAKING: Evolving/moderate complexity °  °EVALUATION COMPLEXITY: Moderate °  °  °GOALS: °Goals reviewed with patient? Yes °  °SHORT TERM GOALS: °  °STG Name Target Date Goal status  °1 Patient to demonstrate independence in HEP °Baseline: TBD 05/01/2021 INITIAL  °2 Assess functional reach test to determine sitting balance deficits °Baseline: 41/44 05/01/2021 Goal met  °LONG TERM GOALS:  °  °LTG Name Target Date Goal status  °1 Decrease 5x STS time to 40s °Baseline: 50s 05/15/2021 INITIAL  °2 Increase hamstring flexibility to 80d SLR B °Baseline: 60d SLR B 05/15/2021 INITIAL  °3 Assess functional reach test goal °Baseline:TBD 05/15/2021 INITIAL  °4 Improve WC/chair transfer to S °Baseline: WC/chair transfers require SBA 05/15/2021 INITIAL  °PLAN: °PT FREQUENCY: 2x/week °  °PT DURATION: 4 weeks °  °PLANNED INTERVENTIONS: Therapeutic exercises, Therapeutic activity, Neuro Muscular re-education, Balance training, Gait  training, Patient/Family education, Joint mobilization, and Manual therapy °  °PLAN FOR NEXT SESSION: establish HEP, B hamstring stretches, manual techniques to minimize tone in B hamstrings, positional release techniques and quadriped? ° ° ° °Jeffrey M Ziemba, PT °04/26/2021, 2:05 PM ° °   °

## 2021-04-29 ENCOUNTER — Ambulatory Visit: Payer: Medicare HMO

## 2021-04-29 ENCOUNTER — Other Ambulatory Visit: Payer: Self-pay

## 2021-04-29 DIAGNOSIS — R293 Abnormal posture: Secondary | ICD-10-CM

## 2021-04-29 DIAGNOSIS — M62838 Other muscle spasm: Secondary | ICD-10-CM | POA: Diagnosis not present

## 2021-04-29 DIAGNOSIS — R262 Difficulty in walking, not elsewhere classified: Secondary | ICD-10-CM | POA: Diagnosis not present

## 2021-04-29 DIAGNOSIS — G8250 Quadriplegia, unspecified: Secondary | ICD-10-CM | POA: Diagnosis not present

## 2021-04-29 NOTE — Therapy (Signed)
OUTPATIENT PHYSICAL THERAPY TREATMENT NOTE   Patient Name: Andrew Sullivan MRN: [id number] DOB:1970/10/17, 51 y.o., male Today's Date: 04/29/2021  PCP: Alcus Dad, MD REFERRING PROVIDER: Alcus Dad, MD   PT End of Session - 04/29/21 1615     Visit Number 4    Number of Visits 8    Date for PT Re-Evaluation 05/22/21    Authorization Type [employer] M[employer]    Authorization Time Period 04/17/21-05/22/21    Progress Note Due on Visit 8    PT Start Time 1615    PT Stop Time 1700    PT Time Calculation (min) 45 min    Activity Tolerance Patient tolerated treatment well    Behavior During Therapy WFL for tasks assessed/performed             Past Medical History:  Diagnosis Date   Anemia    Erectile dysfunction    Gross hematuria    History of recurrent UTIs    History of spinal cord injury    66 (age 44 fell from ladder) w/ cervical spine fx  s/p  c4 -- c6  fusion--  residual paraplegic bilateral legs (great below T8 uses w/c and can transfer self   Paraparesis of both lower limbs (HCC)    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 23 in 44   Spastic tetraplegia [facility])    Past Surgical History:  Procedure Laterality Date   BIOPSY  06/26/2020   Procedure: BIOPSY;  Surgeon: Jerene Bears, MD;  [facility];  Service: Gastroenterology;;  EGD and COLON   CERVICAL FUSION  1988   C4 -- C6 w/ spinal cord injury   COLONOSCOPY WITH PROPOFOL N/A 06/26/2020   Procedure: COLONOSCOPY WITH PROPOFOL;  Surgeon: Jerene Bears, MD;  [facility]: [facility];  Service: Gastroenterology;  Laterality: N/A;   CYSTOSCOPY WITH RETROGRADE PYELOGRAM, URETEROSCOPY AND STENT PLACEMENT Left 12/27/2013   Procedure: CYSTOSCOPY WITH RETROGRADE PYELOGRAM, URETEROSCOPY AND STENT PLACEMENT;  Surgeon: Reece Packer, MD;  Location: [place];  Service: Urology;  Laterality: Left;   CYSTOSCOPY/RETROGRADE/URETEROSCOPY Left 01/09/2014   Procedure:  CYSTOSCOPY/RETROGRADE/URETEROSCOPY, URETERAL BIOPSY AND STENT EXCHANGE;  Surgeon: Alexis Frock, MD;  Location: [facility];  Service: Urology;  Laterality: Left;   ESOPHAGOGASTRODUODENOSCOPY (EGD) WITH PROPOFOL N/A 06/26/2020   Procedure: ESOPHAGOGASTRODUODENOSCOPY (EGD) WITH PROPOFOL;  Surgeon: Jerene Bears, MD;  Location: [facility];  Service: Gastroenterology;  Laterality: N/A;   HIP ARTHROPLASTY  08/12/2011   Procedure: ARTHROPLASTY BIPOLAR HIP;  Surgeon: Mauri Pole, MD;  Location: [facility];  Service: Orthopedics;  Laterality: Left;  Hemi-arthroplasty   POLYPECTOMY  06/26/2020   Procedure: POLYPECTOMY;  Surgeon: Jerene Bears, MD;  Location: [facility];  Service: Gastroenterology;;   Patient Active Problem List   Diagnosis Date Noted   Inflamed external hemorrhoid 01/04/2021   Abnormal vision screen 09/25/2020   Abnormal digital rectal exam    Benign neoplasm of transverse colon    Anal polyp    Acute gastritis without hemorrhage    Perianal mass 06/07/2020   Iron deficiency anemia 06/07/2020   Symptomatic anemia 05/13/2019   DVT (deep venous thrombosis) ([place]) 10/19/2018   Erectile dysfunction    Spastic tetraplegia ([place]) 12/06/2013   History of recurrent UTIs 05/19/2010   Functional quadriplegia ([place]) 05/10/2009    REFERRING DIAG: spastic tetraplegia  THERAPY DIAG:  Chronic spastic tetraplegia (HCC)  Abnormal posture  Difficulty in walking, not elsewhere classified  PERTINENT HISTORY: Returns to OPPT due to increased hamstring  tightness and tone related to his Dx.            PRECAUTIONS: fall  SUBJECTIVE: Increased tone today but no mare than usual  PAIN:  Are you having pain? No  OBJECTIVE:    DIAGNOSTIC FINDINGS: no changes   COGNITION: Overall cognitive status: Within functional limits for tasks assessed   SENSATION: Light touch: Appears intact   EDEMA:  None noted   MUSCLE TONE: Increased BLE tone especially in hamstrings     MUSCLE  LENGTH: Hamstrings: Right 60 deg; Left 60 deg   DTRs:  Not assessed   POSTURE: rounded shoulders, forward head, decreased lumbar lordosis, increased thoracic kyphosis, and posterior pelvic tilt   AROM/PROM:   PROM Right 04/17/2021 Left 04/17/2021  Hip flexion  [facility]  WFL  Hip extension 10d   10d  Hip abduction      Hip adduction      Hip internal rotation      Hip external rotation      Knee flexion  135d 135d   Knee extension  0d  0d  Ankle dorsiflexion  0d  0d  Ankle plantarflexion  [facility]  WFL  Ankle inversion      Ankle eversion       (Blank rows = not tested) MMT:   MMT Right 04/17/2021 Left 04/17/2021  Hip flexion      Hip extension      Hip abduction      Hip adduction      Hip internal rotation      Hip external rotation      Knee flexion      Knee extension      Ankle dorsiflexion      Ankle plantarflexion      Ankle inversion      Ankle eversion      Not assessed due to tone   BED MOBILITY:  Sit to supine Modified independence Supine to sit Modified independence Rolling to Right Modified independence Rolling to Left Modified independence   TRANSFERS: Assistive device utilized: None  Sit to stand: SBA Stand to sit: SBA Chair to chair: Modified independence Floor:  not tested   RAMP  power WC     GAIT: Gait pattern: Right steppage, Left steppage, scissoring, ataxic, narrow BOS, poor foot clearance- Right, and poor foot clearance- Left Distance walked: 10 Assistive device utilized: Environmental consultant - 2 wheeled Level of assistance: Min A Comments: Steppage gait with decreased B DF as well as hip flexion and knee extension   FUNCTIONAL TESTs:  5 times sit to stand: 50s   PATIENT SURVEYS:  FOTO 58   TODAY'S TREATMENT:   [facility] Adult PT Treatment:                                                DATE: 04/29/21 Therapeutic Exercise: Prone ham curls 20x B Supine hip fallouts 15x each with manual facilitation as needed Supine curl ups with knees held in apart to  counter tone 20x LTR 10x with PT assist Manual Therapy: STM to B hamstrings, 8 min B seated hamstring stretch 2 min hold from [place] Adult PT Treatment:  DATE: 04/26/21 Therapeutic Exercise: Prone ham curls 15x B Supine hip fallouts 10x each with manual facilitation as needed Seated balance challenges 10x BWD/SB allowing for as much range to control return independently Supine curl ups with knees held in apart to counter tone Manual Therapy: STM to B hamstrings, 8 min  [facility] Adult PT Treatment:                                                DATE: 04/24/21 Therapeutic Exercise: FIST test  LTR over ball  AAROM hamstring curls in prone 10x B Manual Therapy: B hamstring releases B adductor stretch      PATIENT EDUCATION: Education details: Discussed eval findings, rehab rationale and POC and patient is in agreement  Person educated: Patient Education method: Explanation Education comprehension: verbalized understanding     HOME EXERCISE PROGRAM: TBD   ASSESSMENT:   CLINICAL IMPRESSION: Continued increased tone but is showing improved function with transfers and mobility.  Able to move in/out of positions with more fluidity and less spasm  REHAB POTENTIAL: Good   CLINICAL DECISION MAKING: Evolving/moderate complexity   EVALUATION COMPLEXITY: Moderate     GOALS: Goals reviewed with patient? Yes   SHORT TERM GOALS:   STG Name Target Date Goal status  1 Patient to demonstrate independence in HEP Baseline: TBD 05/01/2021 INITIAL  2 Assess functional reach test to determine sitting balance deficits Baseline: 41/44 05/01/2021 Goal met  LONG TERM GOALS:    LTG Name Target Date Goal status  1 Decrease 5x STS time to 40s Baseline: 50s 05/15/2021 INITIAL  2 Increase hamstring flexibility to 80d SLR B Baseline: 60d SLR B 05/15/2021 INITIAL  3 Assess functional reach test goal Baseline:TBD 05/15/2021 INITIAL  4 Improve  WC/chair transfer to S Baseline: WC/chair transfers require SBA 05/15/2021 INITIAL  PLAN: PT FREQUENCY: 2x/week   PT DURATION: 4 weeks   PLANNED INTERVENTIONS: Therapeutic exercises, Therapeutic activity, Neuro Muscular re-education, Balance training, Gait training, Patient/Family education, Joint mobilization, and Manual therapy   PLAN FOR NEXT SESSION: establish HEP, B hamstring stretches, manual techniques to minimize tone in B hamstrings, positional release techniques and quadriped?    Lanice Shirts, PT 04/29/2021, 4:24 PM

## 2021-05-02 ENCOUNTER — Other Ambulatory Visit: Payer: Self-pay

## 2021-05-02 ENCOUNTER — Ambulatory Visit: Payer: Medicare HMO

## 2021-05-02 DIAGNOSIS — G8250 Quadriplegia, unspecified: Secondary | ICD-10-CM

## 2021-05-02 DIAGNOSIS — R293 Abnormal posture: Secondary | ICD-10-CM | POA: Diagnosis not present

## 2021-05-02 DIAGNOSIS — M62838 Other muscle spasm: Secondary | ICD-10-CM | POA: Diagnosis not present

## 2021-05-02 DIAGNOSIS — R262 Difficulty in walking, not elsewhere classified: Secondary | ICD-10-CM | POA: Diagnosis not present

## 2021-05-02 NOTE — Therapy (Signed)
OUTPATIENT PHYSICAL THERAPY TREATMENT NOTE   Patient Name: Andrew Sullivan MRN: [id number] DOB:[birth year], 51 y.o., male Today's Date: 05/02/2021  PCP: Alcus Dad, MD REFERRING PROVIDER: Lenoria Chime, MD     Past Medical History:  Diagnosis Date   Anemia    Erectile dysfunction    Gross hematuria    History of recurrent UTIs    History of spinal cord injury    53 (age 89 fell from ladder) w/ cervical spine fx  s/p  c4 -- c6  fusion--  residual paraplegic bilateral legs (great below T8 uses w/c and can transfer self   Paraparesis of both lower limbs ([place])    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 55 in 5   Spastic tetraplegia [facility])    Past Surgical History:  Procedure Laterality Date   BIOPSY  06/26/2020   Procedure: BIOPSY;  Surgeon: Jerene Bears, MD;  [facility];  Service: Gastroenterology;;  EGD and COLON   CERVICAL FUSION  1988   C4 -- C6 w/ spinal cord injury   COLONOSCOPY WITH PROPOFOL N/A 06/26/2020   Procedure: COLONOSCOPY WITH PROPOFOL;  Surgeon: Jerene Bears, MD;  [facility];  Service: Gastroenterology;  Laterality: N/A;   CYSTOSCOPY WITH RETROGRADE PYELOGRAM, URETEROSCOPY AND STENT PLACEMENT Left 12/27/2013   Procedure: CYSTOSCOPY WITH RETROGRADE PYELOGRAM, URETEROSCOPY AND STENT PLACEMENT;  Surgeon: Reece Packer, MD;  Location: [place];  Service: Urology;  Laterality: Left;   CYSTOSCOPY/RETROGRADE/URETEROSCOPY Left 01/09/2014   Procedure: CYSTOSCOPY/RETROGRADE/URETEROSCOPY, URETERAL BIOPSY AND STENT EXCHANGE;  Surgeon: Alexis Frock, MD;  [facility];  Service: Urology;  Laterality: Left;   ESOPHAGOGASTRODUODENOSCOPY (EGD) WITH PROPOFOL N/A 06/26/2020   Procedure: ESOPHAGOGASTRODUODENOSCOPY (EGD) WITH PROPOFOL;  Surgeon: Jerene Bears, MD;  [facility];  Service: Gastroenterology;  Laterality: N/A;   HIP ARTHROPLASTY  08/12/2011   Procedure: ARTHROPLASTY BIPOLAR HIP;  Surgeon: Mauri Pole, MD;  [facility];  Service: Orthopedics;  Laterality: Left;  Hemi-arthroplasty   POLYPECTOMY  06/26/2020   Procedure: POLYPECTOMY;  Surgeon: Jerene Bears, MD;  Location: [facility];  Service: Gastroenterology;;   Patient Active Problem List   Diagnosis Date Noted   Inflamed external hemorrhoid 01/04/2021   Abnormal vision screen 09/25/2020   Abnormal digital rectal exam    Benign neoplasm of transverse colon    Anal polyp    Acute gastritis without hemorrhage    Perianal mass 06/07/2020   Iron deficiency anemia 06/07/2020   Symptomatic anemia 05/13/2019   DVT (deep venous thrombosis) ([place]) 10/19/2018   Erectile dysfunction    Spastic tetraplegia ([place]) 12/06/2013   History of recurrent UTIs 05/19/2010   Functional quadriplegia ([place]) 05/10/2009    REFERRING DIAG: spastic tetraplegia  THERAPY DIAG:  No diagnosis found.  PERTINENT HISTORY: Returns to OPPT due to increased hamstring tightness and tone related to his Dx.            PRECAUTIONS: fall  SUBJECTIVE: Reports he has been able to transition in/out of his WC with less spasm/tone in LEs, less pain reported as a result  PAIN:  Are you having pain? No  OBJECTIVE:    DIAGNOSTIC FINDINGS: no changes   COGNITION: Overall cognitive status: Within functional limits for tasks assessed   SENSATION: Light touch: Appears intact   EDEMA:  None noted   MUSCLE TONE: Increased BLE tone especially in hamstrings     MUSCLE LENGTH: Hamstrings: Right 60 deg; Left 60 deg   DTRs:  Not  assessed   POSTURE: rounded shoulders, forward head, decreased lumbar lordosis, increased thoracic kyphosis, and posterior pelvic tilt   AROM/PROM:   PROM Right 04/17/2021 Left 04/17/2021  Hip flexion  [facility]  Hip extension 10d   10d  Hip abduction      Hip adduction      Hip internal rotation      Hip external rotation      Knee flexion  135d 135d   Knee extension  0d  0d  Ankle dorsiflexion  0d  0d  Ankle  plantarflexion  [facility]  WFL  Ankle inversion      Ankle eversion       (Blank rows = not tested) MMT:   MMT Right 04/17/2021 Left 04/17/2021  Hip flexion      Hip extension      Hip abduction      Hip adduction      Hip internal rotation      Hip external rotation      Knee flexion      Knee extension      Ankle dorsiflexion      Ankle plantarflexion      Ankle inversion      Ankle eversion      Not assessed due to tone   BED MOBILITY:  Sit to supine Modified independence Supine to sit Modified independence Rolling to Right Modified independence Rolling to Left Modified independence   TRANSFERS: Assistive device utilized: None  Sit to stand: SBA Stand to sit: SBA Chair to chair: Modified independence Floor:  not tested   RAMP  power WC     GAIT: Gait pattern: Right steppage, Left steppage, scissoring, ataxic, narrow BOS, poor foot clearance- Right, and poor foot clearance- Left Distance walked: 10 Assistive device utilized: Environmental consultant - 2 wheeled Level of assistance: Min A Comments: Steppage gait with decreased B DF as well as hip flexion and knee extension   FUNCTIONAL TESTs:  5 times sit to stand: 50s   PATIENT SURVEYS:  FOTO 58   TODAY'S TREATMENT:  [facility] Adult PT Treatment:                                                DATE: 05/02/21 Therapeutic Exercise: Prone ham curls 20x B Supine hip fallouts 15x each with manual facilitation as needed Bridge with PT assist to facilitate quads 2x15 Seated FAQs 30x B Seated hip tosses, OH reach and forward reach using yellow weighted ball 10x each for sitting balance challenges Manual Therapy: STM to B hamstrings, 8 min B seated hamstring stretch 2 min hold from WC B seated gastroc stretch 2 min hold from [place] Adult PT Treatment:                                                DATE: 04/29/21 Therapeutic Exercise: Prone ham curls 20x B Supine hip fallouts 15x each with manual facilitation as needed Supine curl ups  with knees held in apart to counter tone 20x LTR 10x with PT assist Manual Therapy: STM to B hamstrings, 8 min B seated hamstring stretch 2 min hold from [place] Adult PT Treatment:  DATE: 04/26/21 Therapeutic Exercise: Prone ham curls 15x B Supine hip fallouts 10x each with manual facilitation as needed Seated balance challenges 10x BWD/SB allowing for as much range to control return independently Supine curl ups with knees held in apart to counter tone Manual Therapy: STM to B hamstrings, 8 min        PATIENT EDUCATION: Education details: Discussed eval findings, rehab rationale and POC and patient is in agreement  Person educated: Patient Education method: Explanation Education comprehension: verbalized understanding     HOME EXERCISE PROGRAM: TBD   ASSESSMENT:   CLINICAL IMPRESSION: Added additional seated stretches as well as quad activation tasks to diminish hamstring tone, incorporated heel cord stretching and added seated balance challenges.  Several LOB episodes noted with seated tasksearly on with patient able to accommodate and correct for LOB  REHAB POTENTIAL: Good   CLINICAL DECISION MAKING: Evolving/moderate complexity   EVALUATION COMPLEXITY: Moderate     GOALS: Goals reviewed with patient? Yes   SHORT TERM GOALS:   STG Name Target Date Goal status  1 Patient to demonstrate independence in HEP Baseline: TBD 05/01/2021 INITIAL  2 Assess functional reach test to determine sitting balance deficits Baseline: 41/44 05/01/2021 Goal met  LONG TERM GOALS:    LTG Name Target Date Goal status  1 Decrease 5x STS time to 40s Baseline: 50s 05/15/2021 INITIAL  2 Increase hamstring flexibility to 80d SLR B Baseline: 60d SLR B 05/15/2021 INITIAL  3 Assess functional reach test goal Baseline:TBD 05/15/2021 INITIAL  4 Improve WC/chair transfer to S Baseline: WC/chair transfers require SBA 05/15/2021 INITIAL  PLAN: PT  FREQUENCY: 2x/week   PT DURATION: 4 weeks   PLANNED INTERVENTIONS: Therapeutic exercises, Therapeutic activity, Neuro Muscular re-education, Balance training, Gait training, Patient/Family education, Joint mobilization, and Manual therapy   PLAN FOR NEXT SESSION: establish HEP, B hamstring stretches, manual techniques to minimize tone in B hamstrings, positional release techniques and quadriped, seated balance tasks challenges    Lanice Shirts, PT 05/02/2021, 2:41 PM

## 2021-05-06 ENCOUNTER — Other Ambulatory Visit: Payer: Self-pay

## 2021-05-06 ENCOUNTER — Ambulatory Visit: Payer: Medicare HMO | Admitting: Physical Therapy

## 2021-05-06 ENCOUNTER — Encounter: Payer: Self-pay | Admitting: Physical Therapy

## 2021-05-06 DIAGNOSIS — R293 Abnormal posture: Secondary | ICD-10-CM

## 2021-05-06 DIAGNOSIS — R262 Difficulty in walking, not elsewhere classified: Secondary | ICD-10-CM

## 2021-05-06 DIAGNOSIS — M62838 Other muscle spasm: Secondary | ICD-10-CM | POA: Diagnosis not present

## 2021-05-06 DIAGNOSIS — G8250 Quadriplegia, unspecified: Secondary | ICD-10-CM | POA: Diagnosis not present

## 2021-05-06 NOTE — Therapy (Addendum)
OUTPATIENT PHYSICAL THERAPY TREATMENT NOTE   Patient Name: Andrew Sullivan MRN: [id number] DOB:May 15, 1970, 51 y.o., male Today's Date: 05/06/2021  PCP: Alcus Dad, MD REFERRING PROVIDER: Alcus Dad, MD   PT End of Session - 05/06/21 1318     Visit Number 6    Number of Visits 8    Date for PT Re-Evaluation 05/22/21    Authorization Type [employer] MCR    Authorization Time Period 04/17/21-05/22/21    Progress Note Due on Visit 8    PT Start Time 1315    PT Stop Time 1355    PT Time Calculation (min) 40 min              Past Medical History:  Diagnosis Date   Anemia    Erectile dysfunction    Gross hematuria    History of recurrent UTIs    History of spinal cord injury    8 (age 63 fell from ladder) w/ cervical spine fx  s/p  c4 -- c6  fusion--  residual paraplegic bilateral legs (great below T8 uses w/c and can transfer self   Paraparesis of both lower limbs ([facility])    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 53 in 14   Spastic tetraplegia [facility])    Past Surgical History:  Procedure Laterality Date   BIOPSY  06/26/2020   Procedure: BIOPSY;  Surgeon: Jerene Bears, MD;  [facility];  Service: Gastroenterology;;  EGD and COLON   CERVICAL FUSION  1988   C4 -- C6 w/ spinal cord injury   COLONOSCOPY WITH PROPOFOL N/A 06/26/2020   Procedure: COLONOSCOPY WITH PROPOFOL;  Surgeon: Jerene Bears, MD;  [facility];  Service: Gastroenterology;  Laterality: N/A;   CYSTOSCOPY WITH RETROGRADE PYELOGRAM, URETEROSCOPY AND STENT PLACEMENT Left 12/27/2013   Procedure: CYSTOSCOPY WITH RETROGRADE PYELOGRAM, URETEROSCOPY AND STENT PLACEMENT;  Surgeon: Reece Packer, MD;  Location: [place];  Service: Urology;  Laterality: Left;   CYSTOSCOPY/RETROGRADE/URETEROSCOPY Left 01/09/2014   Procedure: CYSTOSCOPY/RETROGRADE/URETEROSCOPY, URETERAL BIOPSY AND STENT EXCHANGE;  Surgeon: Alexis Frock, MD;  [facility];  Service: Urology;   Laterality: Left;   ESOPHAGOGASTRODUODENOSCOPY (EGD) WITH PROPOFOL N/A 06/26/2020   Procedure: ESOPHAGOGASTRODUODENOSCOPY (EGD) WITH PROPOFOL;  Surgeon: Jerene Bears, MD;  [facility];  Service: Gastroenterology;  Laterality: N/A;   HIP ARTHROPLASTY  08/12/2011   Procedure: ARTHROPLASTY BIPOLAR HIP;  Surgeon: Mauri Pole, MD;  [facility];  Service: Orthopedics;  Laterality: Left;  Hemi-arthroplasty   POLYPECTOMY  06/26/2020   Procedure: POLYPECTOMY;  Surgeon: Jerene Bears, MD;  [facility];  Service: Gastroenterology;;   Patient Active Problem List   Diagnosis Date Noted   Inflamed external hemorrhoid 01/04/2021   Abnormal vision screen 09/25/2020   Abnormal digital rectal exam    Benign neoplasm of transverse colon    Anal polyp    Acute gastritis without hemorrhage    Perianal mass 06/07/2020   Iron deficiency anemia 06/07/2020   Symptomatic anemia 05/13/2019   DVT (deep venous thrombosis) ([place]) 10/19/2018   Erectile dysfunction    Spastic tetraplegia ([place]) 12/06/2013   History of recurrent UTIs 05/19/2010   Functional quadriplegia ([place]) 05/10/2009    REFERRING DIAG: spastic tetraplegia  THERAPY DIAG:  Chronic spastic tetraplegia ([facility])  Abnormal posture  Difficulty in walking, not elsewhere classified  Other muscle spasm  PERTINENT HISTORY: Returns to OPPT due to increased hamstring tightness and tone related to his Dx.  PRECAUTIONS: fall  SUBJECTIVE: Reports he has been able to transition in/out of his WC with less spasm/tone in LEs, less pain reported as a result  PAIN:  Are you having pain? No  OBJECTIVE:    DIAGNOSTIC FINDINGS: no changes   COGNITION: Overall cognitive status: Within functional limits for tasks assessed   SENSATION: Light touch: Appears intact   EDEMA:  None noted   MUSCLE TONE: Increased BLE tone especially in hamstrings     MUSCLE LENGTH: Hamstrings: Right 60 deg; Left 60 deg   DTRs:  Not  assessed   POSTURE: rounded shoulders, forward head, decreased lumbar lordosis, increased thoracic kyphosis, and posterior pelvic tilt   AROM/PROM:   PROM Right 04/17/2021 Left 04/17/2021  Hip flexion  [facility]  WFL  Hip extension 10d   10d  Hip abduction      Hip adduction      Hip internal rotation      Hip external rotation      Knee flexion  135d 135d   Knee extension  0d  0d  Ankle dorsiflexion  0d  0d  Ankle plantarflexion  [facility]  WFL  Ankle inversion      Ankle eversion       (Blank rows = not tested) MMT:   MMT Right 04/17/2021 Left 04/17/2021  Hip flexion      Hip extension      Hip abduction      Hip adduction      Hip internal rotation      Hip external rotation      Knee flexion      Knee extension      Ankle dorsiflexion      Ankle plantarflexion      Ankle inversion      Ankle eversion      Not assessed due to tone   BED MOBILITY:  Sit to supine Modified independence Supine to sit Modified independence Rolling to Right Modified independence Rolling to Left Modified independence   TRANSFERS: Assistive device utilized: None  Sit to stand: SBA Stand to sit: SBA Chair to chair: Modified independence Floor:  not tested   RAMP  power WC     GAIT: Gait pattern: Right steppage, Left steppage, scissoring, ataxic, narrow BOS, poor foot clearance- Right, and poor foot clearance- Left Distance walked: 10 Assistive device utilized: Environmental consultant - 2 wheeled Level of assistance: Min A Comments: Steppage gait with decreased B DF as well as hip flexion and knee extension   FUNCTIONAL TESTs:  5 times sit to stand: 50s   PATIENT SURVEYS:  FOTO 58   TODAY'S TREATMENT:  [facility] Adult PT Treatment:                                                DATE: 05/02/21 Therapeutic Exercise:  Supine hip fallouts 15x each with manual facilitation as needed Bridge with PT assist to facilitate quads 2x15 Seated hip tosses, OH reach and forward reach using yellow weighted ball 10x each  for sitting balance challenges Manual Therapy: STM to B hamstrings, 8 min B seated hamstring stretch 2 min hold from WC B seated gastroc stretch 2 min hold from [place] Adult PT Treatment:  DATE: 04/29/21 Therapeutic Exercise: Prone ham curls 20x B Supine hip fallouts 15x each with manual facilitation as needed Supine curl ups with knees held in apart to counter tone 20x LTR 10x with PT assist Manual Therapy: STM to B hamstrings, 8 min B seated hamstring stretch 2 min hold from [place] Adult PT Treatment:                                                DATE: 04/26/21 Therapeutic Exercise: Prone ham curls 15x B Supine hip fallouts 10x each with manual facilitation as needed Seated balance challenges 10x BWD/SB allowing for as much range to control return independently Supine curl ups with knees held in apart to counter tone Manual Therapy: STM to B hamstrings, 8 min        PATIENT EDUCATION: Education details: Discussed eval findings, rehab rationale and POC and patient is in agreement  Person educated: Patient Education method: Explanation Education comprehension: verbalized understanding     HOME EXERCISE PROGRAM: TBD   ASSESSMENT:   CLINICAL IMPRESSION: Continued with POC per last session. Long duration stretches for hamstrings and calves followed by STW to both. Pt did not judge distance to edge of mat correctly when moving prone to supine and required assist from PTA to prevent fall off edge of mat.   REHAB POTENTIAL: Good   CLINICAL DECISION MAKING: Evolving/moderate complexity   EVALUATION COMPLEXITY: Moderate     GOALS: Goals reviewed with patient? Yes   SHORT TERM GOALS:   STG Name Target Date Goal status  1 Patient to demonstrate independence in HEP Baseline: TBD 05/01/2021 INITIAL  2 Assess functional reach test to determine sitting balance deficits Baseline: 41/44 05/01/2021 Goal met  LONG TERM  GOALS:    LTG Name Target Date Goal status  1 Decrease 5x STS time to 40s Baseline: 50s 05/15/2021 INITIAL  2 Increase hamstring flexibility to 80d SLR B Baseline: 60d SLR B 05/15/2021 INITIAL  3 Assess functional reach test goal Baseline:TBD 05/15/2021 INITIAL  4 Improve WC/chair transfer to S Baseline: WC/chair transfers require SBA 05/15/2021 INITIAL  PLAN: PT FREQUENCY: 2x/week   PT DURATION: 4 weeks   PLANNED INTERVENTIONS: Therapeutic exercises, Therapeutic activity, Neuro Muscular re-education, Balance training, Gait training, Patient/Family education, Joint mobilization, and Manual therapy   PLAN FOR NEXT SESSION: establish HEP, B hamstring stretches, manual techniques to minimize tone in B hamstrings, positional release techniques and quadriped, seated balance tasks challenges    Hessie Diener, PTA 05/06/21 2:05 PM Phone: [phone] Fax: [phone]

## 2021-05-09 ENCOUNTER — Other Ambulatory Visit: Payer: Self-pay

## 2021-05-09 ENCOUNTER — Ambulatory Visit: Payer: Medicare HMO | Attending: Family Medicine

## 2021-05-09 DIAGNOSIS — R293 Abnormal posture: Secondary | ICD-10-CM | POA: Diagnosis not present

## 2021-05-09 DIAGNOSIS — G8250 Quadriplegia, unspecified: Secondary | ICD-10-CM | POA: Diagnosis not present

## 2021-05-09 NOTE — Therapy (Signed)
OUTPATIENT PHYSICAL THERAPY TREATMENT NOTE   Patient Name: Andrew Sullivan MRN: [id number] DOB:1971/01/07, 51 y.o., male Today's Date: 05/09/2021  PCP: Alcus Dad, MD REFERRING PROVIDER: Lenoria Chime, MD   PT End of Session - 05/09/21 1618     Visit Number 7    Number of Visits 8    Date for PT Re-Evaluation 05/22/21    Authorization Type [employer] MCR    Authorization Time Period 04/17/21-05/22/21    Progress Note Due on Visit 8    PT Start Time 1620    PT Stop Time 1700    PT Time Calculation (min) 40 min    Activity Tolerance Patient tolerated treatment well    Behavior During Therapy WFL for tasks assessed/performed              Past Medical History:  Diagnosis Date   Anemia    Erectile dysfunction    Gross hematuria    History of recurrent UTIs    History of spinal cord injury    99 (age 27 fell from ladder) w/ cervical spine fx  s/p  c4 -- c6  fusion--  residual paraplegic bilateral legs (great below T8 uses w/c and can transfer self   Paraparesis of both lower limbs (HCC)    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 69 in 59   Spastic tetraplegia [facility])    Past Surgical History:  Procedure Laterality Date   BIOPSY  06/26/2020   Procedure: BIOPSY;  Surgeon: Jerene Bears, MD;  [facility]: [facility];  Service: Gastroenterology;;  EGD and COLON   CERVICAL FUSION  1988   C4 -- C6 w/ spinal cord injury   COLONOSCOPY WITH PROPOFOL N/A 06/26/2020   Procedure: COLONOSCOPY WITH PROPOFOL;  Surgeon: Jerene Bears, MD;  [facility]: [facility];  Service: Gastroenterology;  Laterality: N/A;   CYSTOSCOPY WITH RETROGRADE PYELOGRAM, URETEROSCOPY AND STENT PLACEMENT Left 12/27/2013   Procedure: CYSTOSCOPY WITH RETROGRADE PYELOGRAM, URETEROSCOPY AND STENT PLACEMENT;  Surgeon: Reece Packer, MD;  Location: [place];  Service: Urology;  Laterality: Left;   CYSTOSCOPY/RETROGRADE/URETEROSCOPY Left 01/09/2014   Procedure:  CYSTOSCOPY/RETROGRADE/URETEROSCOPY, URETERAL BIOPSY AND STENT EXCHANGE;  Surgeon: Alexis Frock, MD;  [facility];  Service: Urology;  Laterality: Left;   ESOPHAGOGASTRODUODENOSCOPY (EGD) WITH PROPOFOL N/A 06/26/2020   Procedure: ESOPHAGOGASTRODUODENOSCOPY (EGD) WITH PROPOFOL;  Surgeon: Jerene Bears, MD;  [facility];  Service: Gastroenterology;  Laterality: N/A;   HIP ARTHROPLASTY  08/12/2011   Procedure: ARTHROPLASTY BIPOLAR HIP;  Surgeon: Mauri Pole, MD;  Location: [facility];  Service: Orthopedics;  Laterality: Left;  Hemi-arthroplasty   POLYPECTOMY  06/26/2020   Procedure: POLYPECTOMY;  Surgeon: Jerene Bears, MD;  Location[facility];  Service: Gastroenterology;;   Patient Active Problem List   Diagnosis Date Noted   Inflamed external hemorrhoid 01/04/2021   Abnormal vision screen 09/25/2020   Abnormal digital rectal exam    Benign neoplasm of transverse colon    Anal polyp    Acute gastritis without hemorrhage    Perianal mass 06/07/2020   Iron deficiency anemia 06/07/2020   Symptomatic anemia 05/13/2019   DVT (deep venous thrombosis) ([place]) 10/19/2018   Erectile dysfunction    Spastic tetraplegia ([place]) 12/06/2013   History of recurrent UTIs 05/19/2010   Functional quadriplegia ([place]) 05/10/2009    REFERRING DIAG: spastic tetraplegia  THERAPY DIAG:  Chronic spastic tetraplegia (HCC)  Abnormal posture  PERTINENT HISTORY: Returns to OPPT due to increased hamstring tightness and tone related to  his Dx.            PRECAUTIONS: fall  SUBJECTIVE: Reports he has been stretching his hamstrings at home as part of his HEP and continues to attend a gym  PAIN:  Are you having pain? No  OBJECTIVE:    DIAGNOSTIC FINDINGS: no changes   COGNITION: Overall cognitive status: Within functional limits for tasks assessed   SENSATION: Light touch: Appears intact   EDEMA:  None noted   MUSCLE TONE: Increased BLE tone especially in hamstrings     MUSCLE  LENGTH: Hamstrings: Right 60 deg; Left 60 deg   DTRs:  Not assessed   POSTURE: rounded shoulders, forward head, decreased lumbar lordosis, increased thoracic kyphosis, and posterior pelvic tilt   AROM/PROM:   PROM Right 04/17/2021 Left 04/17/2021  Hip flexion  [facility]  WFL  Hip extension 10d   10d  Hip abduction      Hip adduction      Hip internal rotation      Hip external rotation      Knee flexion  135d 135d   Knee extension  0d  0d  Ankle dorsiflexion  0d  0d  Ankle plantarflexion  [facility]  WFL  Ankle inversion      Ankle eversion       (Blank rows = not tested) MMT:   MMT Right 04/17/2021 Left 04/17/2021  Hip flexion      Hip extension      Hip abduction      Hip adduction      Hip internal rotation      Hip external rotation      Knee flexion      Knee extension      Ankle dorsiflexion      Ankle plantarflexion      Ankle inversion      Ankle eversion      Not assessed due to tone   BED MOBILITY:  Sit to supine Modified independence Supine to sit Modified independence Rolling to Right Modified independence Rolling to Left Modified independence   TRANSFERS: Assistive device utilized: None  Sit to stand: SBA Stand to sit: SBA Chair to chair: Modified independence Floor:  not tested   RAMP  power WC     GAIT: Gait pattern: Right steppage, Left steppage, scissoring, ataxic, narrow BOS, poor foot clearance- Right, and poor foot clearance- Left Distance walked: 10 Assistive device utilized: Environmental consultant - 2 wheeled Level of assistance: Min A Comments: Steppage gait with decreased B DF as well as hip flexion and knee extension   FUNCTIONAL TESTs:  5 times sit to stand: 50s   PATIENT SURVEYS:  FOTO 58   TODAY'S TREATMENT:  [facility] Adult PT Treatment:                                                DATE: 05/09/21 Therapeutic Exercise: Supine hip fallouts 15x each with manual facilitation as needed Bridge with PT assist to facilitate quads x15 Seated hip tosses, OH  reach and forward reach using yellow weighted ball 10x each for sitting balance challenges Supine hamstring curls 15x Seated ball roll fwd and lateral, large physioball, 10x each direction to promote trunk mobility and seated balance Manual Therapy: STM to B hamstrings, 8 min B seated hamstring stretch 2 min hold from WC B seated gastroc stretch 2 min hold  from [facility] Adult PT Treatment:                                                DATE: 05/02/21 Therapeutic Exercise:  Supine hip fallouts 15x each with manual facilitation as needed Bridge with PT assist to facilitate quads 2x15 Seated hip tosses, OH reach and forward reach using yellow weighted ball 10x each for sitting balance challenges Manual Therapy: STM to B hamstrings, 8 min B seated hamstring stretch 2 min hold from WC B seated gastroc stretch 2 min hold from [place] Adult PT Treatment:                                                DATE: 04/29/21 Therapeutic Exercise: Prone ham curls 20x B Supine hip fallouts 15x each with manual facilitation as needed Supine curl ups with knees held in apart to counter tone 20x LTR 10x with PT assist Manual Therapy: STM to B hamstrings, 8 min B seated hamstring stretch 2 min hold from [facility] Adult PT Treatment:                                                DATE: 04/26/21 Therapeutic Exercise: Prone ham curls 15x B Supine hip fallouts 10x each with manual facilitation as needed Seated balance challenges 10x BWD/SB allowing for as much range to control return independently Supine curl ups with knees held in apart to counter tone Manual Therapy: STM to B hamstrings, 8 min        PATIENT EDUCATION: Education details: Discussed eval findings, rehab rationale and POC and patient is in agreement  Person educated: Patient Education method: Explanation Education comprehension: verbalized understanding     HOME EXERCISE PROGRAM: TBD   ASSESSMENT:   CLINICAL  IMPRESSION: No lob noted today with  seated balance challenges.  Able to preform 15 PKBs on L unassisted   REHAB POTENTIAL: Good   CLINICAL DECISION MAKING: Evolving/moderate complexity   EVALUATION COMPLEXITY: Moderate     GOALS: Goals reviewed with patient? Yes   SHORT TERM GOALS:   STG Name Target Date Goal status  1 Patient to demonstrate independence in HEP Baseline: Hamstring stertching 05/01/2021 Goal met  2 Assess functional reach test to determine sitting balance deficits Baseline: 41/44 05/01/2021 Goal met  LONG TERM GOALS:    LTG Name Target Date Goal status  1 Decrease 5x STS time to 40s Baseline: 50s 05/15/2021 INITIAL  2 Increase hamstring flexibility to 80d SLR B Baseline: 60d SLR B 05/15/2021 INITIAL  3 Assess functional reach test goal Baseline: No need 05/15/2021 Deferred  4 Improve WC/chair transfer to S Baseline: WC/chair transfers require SBA 05/15/2021 Met  PLAN: PT FREQUENCY: 2x/week   PT DURATION: 4 weeks   PLANNED INTERVENTIONS: Therapeutic exercises, Therapeutic activity, Neuro Muscular re-education, Balance training, Gait training, Patient/Family education, Joint mobilization, and Manual therapy   PLAN FOR NEXT SESSION: DC to HEP   Leroy Sea PT  05/09/21 4:19 PM Phone: [phone] Fax: [phone]

## 2021-05-13 ENCOUNTER — Other Ambulatory Visit: Payer: Self-pay

## 2021-05-13 ENCOUNTER — Encounter: Payer: Self-pay | Admitting: Physical Therapy

## 2021-05-13 ENCOUNTER — Ambulatory Visit: Payer: Medicare HMO | Admitting: Physical Therapy

## 2021-05-13 DIAGNOSIS — R293 Abnormal posture: Secondary | ICD-10-CM

## 2021-05-13 DIAGNOSIS — G8250 Quadriplegia, unspecified: Secondary | ICD-10-CM

## 2021-05-13 NOTE — Therapy (Signed)
OUTPATIENT PHYSICAL THERAPY TREATMENT NOTE   Patient Name: Andrew Sullivan MRN: [id number] DOB:1971-03-05, 51 y.o., male Today's Date: 05/13/2021  PCP: Alcus Dad, MD REFERRING PROVIDER: Alcus Dad, MD   PT End of Session - 05/13/21 1254     Visit Number 8    Number of Visits 9    Date for PT Re-Evaluation 05/22/21    Authorization Type [employer] MCR    Authorization Time Period 04/17/21-05/22/21    PT Start Time 1310    PT Stop Time 1400    PT Time Calculation (min) 50 min              Past Medical History:  Diagnosis Date   Anemia    Erectile dysfunction    Gross hematuria    History of recurrent UTIs    History of spinal cord injury    29 (age 23 fell from ladder) w/ cervical spine fx  s/p  c4 -- c6  fusion--  residual paraplegic bilateral legs (great below T8 uses w/c and can transfer self   Paraparesis of both lower limbs ([place])    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 34 in 74   Spastic tetraplegia [facility])    Past Surgical History:  Procedure Laterality Date   BIOPSY  06/26/2020   Procedure: BIOPSY;  Surgeon: Jerene Bears, MD;  [facility];  Service: Gastroenterology;;  EGD and COLON   CERVICAL FUSION  1988   C4 -- C6 w/ spinal cord injury   COLONOSCOPY WITH PROPOFOL N/A 06/26/2020   Procedure: COLONOSCOPY WITH PROPOFOL;  Surgeon: Jerene Bears, MD;  [facility];  Service: Gastroenterology;  Laterality: N/A;   CYSTOSCOPY WITH RETROGRADE PYELOGRAM, URETEROSCOPY AND STENT PLACEMENT Left 12/27/2013   Procedure: CYSTOSCOPY WITH RETROGRADE PYELOGRAM, URETEROSCOPY AND STENT PLACEMENT;  Surgeon: Reece Packer, MD;  Location: [place];  Service: Urology;  Laterality: Left;   CYSTOSCOPY/RETROGRADE/URETEROSCOPY Left 01/09/2014   Procedure: CYSTOSCOPY/RETROGRADE/URETEROSCOPY, URETERAL BIOPSY AND STENT EXCHANGE;  Surgeon: Alexis Frock, MD;  [facility];  Service: Urology;  Laterality: Left;    ESOPHAGOGASTRODUODENOSCOPY (EGD) WITH PROPOFOL N/A 06/26/2020   Procedure: ESOPHAGOGASTRODUODENOSCOPY (EGD) WITH PROPOFOL;  Surgeon: Jerene Bears, MD;  [facility];  Service: Gastroenterology;  Laterality: N/A;   HIP ARTHROPLASTY  08/12/2011   Procedure: ARTHROPLASTY BIPOLAR HIP;  Surgeon: Mauri Pole, MD;  [facility];  Service: Orthopedics;  Laterality: Left;  Hemi-arthroplasty   POLYPECTOMY  06/26/2020   Procedure: POLYPECTOMY;  Surgeon: Jerene Bears, MD;  [facility];  Service: Gastroenterology;;   Patient Active Problem List   Diagnosis Date Noted   Inflamed external hemorrhoid 01/04/2021   Abnormal vision screen 09/25/2020   Abnormal digital rectal exam    Benign neoplasm of transverse colon    Anal polyp    Acute gastritis without hemorrhage    Perianal mass 06/07/2020   Iron deficiency anemia 06/07/2020   Symptomatic anemia 05/13/2019   DVT (deep venous thrombosis) ([place]) 10/19/2018   Erectile dysfunction    Spastic tetraplegia ([place]) 12/06/2013   History of recurrent UTIs 05/19/2010   Functional quadriplegia ([place]) 05/10/2009    REFERRING DIAG: spastic tetraplegia  THERAPY DIAG:  Chronic spastic tetraplegia (HCC)  Abnormal posture  PERTINENT HISTORY: Returns to OPPT due to increased hamstring tightness and tone related to his Dx.            PRECAUTIONS: fall  SUBJECTIVE: Reports he has been stretching his hamstrings at home as part of  his HEP and continues to attend a gym  PAIN:  Are you having pain? No  OBJECTIVE:    DIAGNOSTIC FINDINGS: no changes   COGNITION: Overall cognitive status: Within functional limits for tasks assessed   SENSATION: Light touch: Appears intact   EDEMA:  None noted   MUSCLE TONE: Increased BLE tone especially in hamstrings     MUSCLE LENGTH: Hamstrings: Right 60 deg; Left 60 deg   DTRs:  Not assessed   POSTURE: rounded shoulders, forward head, decreased lumbar lordosis, increased thoracic  kyphosis, and posterior pelvic tilt   AROM/PROM:   PROM Right 04/17/2021 Left 04/17/2021  Hip flexion  [facility]  WFL  Hip extension 10d   10d  Hip abduction      Hip adduction      Hip internal rotation      Hip external rotation      Knee flexion  135d 135d   Knee extension  0d  0d  Ankle dorsiflexion  0d  0d  Ankle plantarflexion  [facility]  WFL  Ankle inversion      Ankle eversion       (Blank rows = not tested) MMT:   MMT Right 04/17/2021 Left 04/17/2021  Hip flexion      Hip extension      Hip abduction      Hip adduction      Hip internal rotation      Hip external rotation      Knee flexion      Knee extension      Ankle dorsiflexion      Ankle plantarflexion      Ankle inversion      Ankle eversion      Not assessed due to tone   BED MOBILITY:  Sit to supine Modified independence Supine to sit Modified independence Rolling to Right Modified independence Rolling to Left Modified independence   TRANSFERS: Assistive device utilized: None  Sit to stand: SBA Stand to sit: SBA Chair to chair: Modified independence Floor:  not tested   RAMP  power WC     GAIT: Gait pattern: Right steppage, Left steppage, scissoring, ataxic, narrow BOS, poor foot clearance- Right, and poor foot clearance- Left Distance walked: 10 Assistive device utilized: Environmental consultant - 2 wheeled Level of assistance: Min A Comments: Steppage gait with decreased B DF as well as hip flexion and knee extension   FUNCTIONAL TESTs:  5 times sit to stand: 50s   PATIENT SURVEYS:  FOTO 58   TODAY'S TREATMENT:  [facility] Adult PT Treatment:                                                DATE: 05/13/21 Therapeutic Exercise: Supine hip fallouts 15x each with manual facilitation as needed Bridge with PT assist to facilitate quads x15 Seated hip tosses, OH reach and forward reach using yellow weighted ball 10x each for sitting balance challenges Seated LAQ with strap assist   Manual Therapy: STM to B hamstrings, 8  min B seated hamstring stretch 2 min hold from WC B seated gastroc stretch 2 min hold from [facility] Modalities:  HMP to hamstrings in prone (ankles elevated) x 10 minutes     [facility] Adult PT Treatment:  DATE: 05/09/21 Therapeutic Exercise: Supine hip fallouts 15x each with manual facilitation as needed Bridge with PT assist to facilitate quads x15 Seated hip tosses, OH reach and forward reach using yellow weighted ball 10x each for sitting balance challenges Supine hamstring curls 15x Seated ball roll fwd and lateral, large physioball, 10x each direction to promote trunk mobility and seated balance Manual Therapy: STM to B hamstrings, 8 min B seated hamstring stretch 2 min hold from WC B seated gastroc stretch 2 min hold from [facility] Adult PT Treatment:                                                DATE: 05/02/21 Therapeutic Exercise:  Supine hip fallouts 15x each with manual facilitation as needed Bridge with PT assist to facilitate quads 2x15 Seated hip tosses, OH reach and forward reach using yellow weighted ball 10x each for sitting balance challenges Manual Therapy: STM to B hamstrings, 8 min B seated hamstring stretch 2 min hold from WC B seated gastroc stretch 2 min hold from [place] Adult PT Treatment:                                                DATE: 04/29/21 Therapeutic Exercise: Prone ham curls 20x B Supine hip fallouts 15x each with manual facilitation as needed Supine curl ups with knees held in apart to counter tone 20x LTR 10x with PT assist Manual Therapy: STM to B hamstrings, 8 min B seated hamstring stretch 2 min hold from [place] Adult PT Treatment:                                                DATE: 04/26/21 Therapeutic Exercise: Prone ham curls 15x B Supine hip fallouts 10x each with manual facilitation as needed Seated balance challenges 10x BWD/SB allowing for as much range to control return  independently Supine curl ups with knees held in apart to counter tone Manual Therapy: STM to B hamstrings, 8 min        PATIENT EDUCATION: Education details: Discussed eval findings, rehab rationale and POC and patient is in agreement  Person educated: Patient Education method: Explanation Education comprehension: verbalized understanding     HOME EXERCISE PROGRAM: TBD   ASSESSMENT:   CLINICAL IMPRESSION: Continued with seated balance challenges and assisted LE ROM and stretching /PROM for completion of goals. Pt requested HMP to hamstrings at end of session. No LOB today with bed mobility or transfers.   REHAB POTENTIAL: Good   CLINICAL DECISION MAKING: Evolving/moderate complexity   EVALUATION COMPLEXITY: Moderate     GOALS: Goals reviewed with patient? Yes   SHORT TERM GOALS:   STG Name Target Date Goal status  1 Patient to demonstrate independence in HEP Baseline: Hamstring stertching 05/01/2021 Goal met  2 Assess functional reach test to determine sitting balance deficits Baseline: 41/44 05/01/2021 Goal met  LONG TERM GOALS:    LTG Name Target Date Goal status  1 Decrease 5x STS time to 40s Baseline: 50s 05/15/2021 INITIAL  2 Increase hamstring flexibility to 80d SLR B Baseline: 60d SLR B 05/15/2021 INITIAL  3 Assess functional reach test goal Baseline: No need 05/15/2021 Deferred  4 Improve WC/chair transfer to S Baseline: WC/chair transfers require SBA 05/15/2021 Met  PLAN: PT FREQUENCY: 2x/week   PT DURATION: 4 weeks   PLANNED INTERVENTIONS: Therapeutic exercises, Therapeutic activity, Neuro Muscular re-education, Balance training, Gait training, Patient/Family education, Joint mobilization, and Manual therapy   PLAN FOR NEXT SESSION: DC to [place], PTA 05/13/21 1:53 PM Phone: [phone] Fax: [phone]

## 2021-05-15 ENCOUNTER — Ambulatory Visit (INDEPENDENT_AMBULATORY_CARE_PROVIDER_SITE_OTHER): Payer: Medicare HMO

## 2021-05-15 ENCOUNTER — Other Ambulatory Visit: Payer: Self-pay

## 2021-05-15 DIAGNOSIS — Z0000 Encounter for general adult medical examination without abnormal findings: Secondary | ICD-10-CM | POA: Diagnosis not present

## 2021-05-15 NOTE — Progress Notes (Signed)
Subjective:   Andrew Sullivan is a 51 y.o. male who presents for Medicare Annual/Subsequent preventive examination.  The patient consented to a virtual visit. Patient consented to have virtual visit and was identified by name and date of birth. Method of visit: Telephone  Encounter participants: Patient: Andrew Sullivan - located at Home Nurse/Provider: Dorna Bloom - located at [facility] Others (if applicable): NA  Review of Systems: Defer to PCP.  Cardiac Risk Factors include: male gender  Objective:   Vitals: There were no vitals taken for this visit.  There is no height or weight on file to calculate BMI.  Advanced Directives 05/15/2021 04/17/2021 01/04/2021 11/13/2020 10/24/2020 06/26/2020 06/07/2020  Does Patient Have a Medical Advance Directive? No No No No No No No  Would patient like information on creating a medical advance directive? Yes (MAU/Ambulatory/Procedural Areas - Information given) No - Patient declined No - Patient declined - No - Guardian declined No - Patient declined No - Patient declined  Pre-existing out of facility DNR order (yellow form or pink MOST form) - - - - - - -   Tobacco Social History   Tobacco Use  Smoking Status Former   Packs/day: 0.25   Years: 0.50   Pack years: 0.13   Types: Cigarettes   Quit date: 12/27/1986   Years since quitting: 34.4   Passive exposure: Past  Smokeless Tobacco Never     Counseling given: No plans to restart.   Clinical Intake:  Pre-visit preparation completed: Yes  Diabetes: No  How often do you need to have someone help you when you read instructions, pamphlets, or other written materials from your doctor or pharmacy?: 2 - Rarely What is the last grade level you completed in school?: High School  Interpreter Needed?: No  Past Medical History:  Diagnosis Date   Anemia    Erectile dysfunction    Gross hematuria    History of recurrent UTIs    History of spinal cord injury    64 (age 59 fell from  ladder) w/ cervical spine fx  s/p  c4 -- c6  fusion--  residual paraplegic bilateral legs (great below T8 uses w/c and can transfer self   Paraparesis of both lower limbs ([place])    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 89 in 67   Spastic tetraplegia [facility])    Past Surgical History:  Procedure Laterality Date   BIOPSY  06/26/2020   Procedure: BIOPSY;  Surgeon: Jerene Bears, MD;  [facility];  Service: Gastroenterology;;  EGD and COLON   CERVICAL FUSION  1988   C4 -- C6 w/ spinal cord injury   COLONOSCOPY WITH PROPOFOL N/A 06/26/2020   Procedure: COLONOSCOPY WITH PROPOFOL;  Surgeon: Jerene Bears, MD;  [facility];  Service: Gastroenterology;  Laterality: N/A;   CYSTOSCOPY WITH RETROGRADE PYELOGRAM, URETEROSCOPY AND STENT PLACEMENT Left 12/27/2013   Procedure: CYSTOSCOPY WITH RETROGRADE PYELOGRAM, URETEROSCOPY AND STENT PLACEMENT;  Surgeon: Reece Packer, MD;  Location: [place];  Service: Urology;  Laterality: Left;   CYSTOSCOPY/RETROGRADE/URETEROSCOPY Left 01/09/2014   Procedure: CYSTOSCOPY/RETROGRADE/URETEROSCOPY, URETERAL BIOPSY AND STENT EXCHANGE;  Surgeon: Alexis Frock, MD;  [facility];  Service: Urology;  Laterality: Left;   ESOPHAGOGASTRODUODENOSCOPY (EGD) WITH PROPOFOL N/A 06/26/2020   Procedure: ESOPHAGOGASTRODUODENOSCOPY (EGD) WITH PROPOFOL;  Surgeon: Jerene Bears, MD;  [facility];  Service: Gastroenterology;  Laterality: N/A;   HIP ARTHROPLASTY  08/12/2011   Procedure: ARTHROPLASTY BIPOLAR HIP;  Surgeon: Pietro Cassis  Alvan Dame, MD;  [facility];  Service: Orthopedics;  Laterality: Left;  Hemi-arthroplasty   POLYPECTOMY  06/26/2020   Procedure: POLYPECTOMY;  Surgeon: Jerene Bears, MD;  [facility];  Service: Gastroenterology;;   Family History  Problem Relation Age of Onset   Arthritis Mother    Hypertension Mother    Colon cancer Neg Hx    Esophageal cancer Neg Hx    Pancreatic cancer Neg Hx    Stomach  cancer Neg Hx    Liver disease Neg Hx    Social History   Socioeconomic History   Marital status: Single    Spouse name: Not on file   Number of children: 0   Years of education: 92   Highest education level: 12th grade  Occupational History   Occupation: Disability  Tobacco Use   Smoking status: Former    Packs/day: 0.25    Years: 0.50    Pack years: 0.13    Types: Cigarettes    Quit date: 12/27/1986    Years since quitting: 34.4    Passive exposure: Past   Smokeless tobacco: Never  Vaping Use   Vaping Use: Never used  Substance and Sexual Activity   Alcohol use: No    Comment: hx of use 1980s   Drug use: No   Sexual activity: Not Currently  Other Topics Concern   Not on file  Social History Narrative   Patient lives with his mother and step father.   Is on disability secondary to neck fracture.    Stays active going to gym and church. Uses [employer] for transportation or local bus.   Enjoys going to the movies and playing cards.    Social Determinants of Health   Financial Resource Strain: Low Risk    Difficulty of Paying Living Expenses: Not hard at all  Food Insecurity: No Food Insecurity   Worried About Charity fundraiser in the Last Year: Never true   [place] in the Last Year: Never true  Transportation Needs: No Transportation Needs   Lack of Transportation (Medical): No   Lack of Transportation (Non-Medical): No  Physical Activity: Insufficiently Active   Days of Exercise per Week: 2 days   Minutes of Exercise per Session: 60 min  Stress: No Stress Concern Present   Feeling of Stress : Not at all  Social Connections: Moderately Integrated   Frequency of Communication with Friends and Family: Twice a week   Frequency of Social Gatherings with Friends and Family: Twice a week   Attends Religious Services: More than 4 times per year   Active Member of [employer] or Organizations: Yes   Attends Archivist Meetings: More than 4 times per year    Marital Status: Never married   Outpatient Encounter Medications as of 05/15/2021  Medication Sig   baclofen (LIORESAL) 20 MG tablet Take 1 tablet (20 mg total) by mouth 4 (four) times daily.   ferrous sulfate 325 (65 FE) MG tablet Take 1 tablet (325 mg total) by mouth every other day.   hydrocortisone cream 1 % APPLY  CREAM EXTERNALLY TWICE DAILY AS NEEDED   Lidocaine, Anorectal, (HEMORRHOIDAL RELIEF) 5 % CREA Apply 1 application topically 2 (two) times daily as needed.   tadalafil (CIALIS) 5 MG tablet Take 5 mg by mouth daily as needed for erectile dysfunction.   apixaban (ELIQUIS) 5 MG TABS tablet Take 1 tablet (5 mg total) by mouth 2 (two) times daily. (Patient not taking: Reported  on 05/15/2021)   Blood Glucose Monitoring Suppl (TRUE METRIX METER) DEVI Use to check blood sugar up to 3 times daily (Patient not taking: Reported on 05/15/2021)   glucose blood (TRUE METRIX BLOOD GLUCOSE TEST) test strip Use to check blood sugar up to 3 times daily (Patient not taking: Reported on 05/15/2021)   pantoprazole (PROTONIX) 40 MG tablet Take 1 tablet (40 mg total) by mouth 2 (two) times daily before a meal. (Patient not taking: Reported on 05/15/2021)   polyethylene glycol powder (GLYCOLAX/MIRALAX) 17 GM/SCOOP powder Take 17 g by mouth daily. (Patient not taking: Reported on 05/15/2021)   TRUEplus Lancets 30G MISC Use to check blood sugar up to 3 times daily (Patient not taking: Reported on 05/15/2021)   No facility-administered encounter medications on file as of 05/15/2021.   Activities of Daily Living In your present state of health, do you have any difficulty performing the following activities: 05/15/2021  Hearing? N  Vision? N  Difficulty concentrating or making decisions? N  Walking or climbing stairs? Y  Dressing or bathing? N  Doing errands, shopping? N  Preparing Food and eating ? N  Using the Toilet? N  In the past six months, have you accidently leaked urine? N  Do you have problems with loss of  bowel control? N  Managing your Medications? N  Managing your Finances? N  Housekeeping or managing your Housekeeping? N  Some recent data might be hidden   Patient Care Team: Alcus Dad, MD as PCP - General (Family Medicine) Alexis Frock, MD as Consulting Physician (Urology) Clent Jacks, MD as Referring Physician (Ophthalmology) Pyrtle, Lajuan Lines, MD as Consulting Physician (Gastroenterology)   Assessment:   This is a routine wellness examination for Jaire.  Exercise Activities and Dietary recommendations Current Exercise Habits: Home exercise routine, Time (Minutes): > 60, Frequency (Times/Week): 2, Weekly Exercise (Minutes/Week): 0, Intensity: Moderate, Exercise limited by: orthopedic condition(s)   Goals      Exercise 3-4x per week     Patient reports going to the gym 2x per week. Would like to increase to 3-4x per week.      Prevent Falls       Fall Risk Fall Risk  05/15/2021 06/07/2020 11/07/2019 02/25/2018 04/17/2017  Falls in the past year? 0 1 0 Exclusion - non ambulatory No  Number falls in past yr: 0 0 0 - -  Comment - - - - -  Injury with Fall? 0 1 - - -  Comment - - - - -  Risk Factor Category  - - - - -  Comment - - - - -  Risk for fall due to : Impaired mobility - - - -  Follow up Falls prevention discussed - Falls evaluation completed - -   Functional quadriplegic. Patient does use a wheelchair and crutches to ambulate.   Is the patient's home free of loose throw rugs in walkways, pet beds, electrical cords, etc?   yes      Grab bars in the bathroom? yes      Handrails on the stairs?   yes      Adequate lighting?   yes  Patient rating of health (0-10): 9   Depression Screen PHQ 2/9 Scores 05/15/2021 03/27/2021 01/04/2021 09/24/2020  PHQ - 2 Score 0 0 0 0  PHQ- 9 Score '1 1 2 3   '$ Cognitive Function MMSE - Mini Mental State Exam 02/25/2018  Orientation to time 5  Orientation to Place 5  Registration 3  Attention/ Calculation 5  Recall 3   Language- name 2 objects 2  Language- repeat 1  Language- follow 3 step command 3  Language- read & follow direction 1  Write a sentence 1  Copy design 1  Total score 30   6CIT Screen 05/15/2021 02/25/2018  What Year? 0 points 0 points  What month? 0 points 0 points  What time? 0 points 0 points  Count back from 20 0 points 0 points  Months in reverse 0 points 0 points  Repeat phrase 0 points 0 points  Total Score 0 0   Immunization History  Administered Date(s) Administered   Td 02/07/2001   Qualifies for Shingles Vaccine? Yes   Shingrix Completed: No, Education has been provided regarding the importance of this vaccine. Advised may receive this vaccine at local pharmacy or Health Dept. Aware to provide a copy of the vaccination record if obtained from local pharmacy or Health Dept. Verbalized acceptance and understanding.  Qualifies for Tetanus Vaccine? Yes   Shingrix Completed: No, Education has been provided regarding the importance of this vaccine. Advised may receive this vaccine at local pharmacy or Health Dept. Aware to provide a copy of the vaccination record if obtained from local pharmacy or Health Dept. Verbalized acceptance and understanding.  Screening Tests Health Maintenance  Topic Date Due   COVID-19 Vaccine (1) Never done   Zoster Vaccines- Shingrix (1 of 2) Never done   TETANUS/TDAP  02/08/2011   INFLUENZA VACCINE  06/07/2021 (Originally 10/08/2020)   COLONOSCOPY (Pts 45-38yr Insurance coverage will need to be confirmed)  06/27/2030   Hepatitis C Screening  Completed   HIV Screening  Completed   HPV VACCINES  Aged Out   Cancer Screenings: Lung: Low Dose CT Chest recommended if Age 964-80years, 30 pack-year currently smoking OR have quit w/in 15years. Patient does not qualify. Colorectal: UTD- 06/26/2020  Additional Screenings: Hepatitis C Screening: Completed  HIV Screening: Completed    Plan:  Annual exam due in July- please call in June to schedule  this. Due for shingles and tetanus vaccines- you can get these at your local pharmacy. Consider covid vaccine series as discussed.  I have personally reviewed and noted the following in the patients chart:   Medical and social history Use of alcohol, tobacco or illicit drugs  Current medications and supplements Functional ability and status Nutritional status Physical activity Advanced directives List of other physicians Hospitalizations, surgeries, and ER visits in previous 12 months Vitals Screenings to include cognitive, depression, and falls Referrals and appointments  In addition, I have reviewed and discussed with patient certain preventive protocols, quality metrics, and best practice recommendations. A written personalized care plan for preventive services as well as general preventive health recommendations were provided to patient.  This visit was conducted virtually in the setting of [place].    EDorna Bloom [place] 05/17/2021

## 2021-05-16 ENCOUNTER — Other Ambulatory Visit: Payer: Self-pay

## 2021-05-16 ENCOUNTER — Ambulatory Visit: Payer: Medicare HMO

## 2021-05-16 DIAGNOSIS — R293 Abnormal posture: Secondary | ICD-10-CM

## 2021-05-16 DIAGNOSIS — G8250 Quadriplegia, unspecified: Secondary | ICD-10-CM | POA: Diagnosis not present

## 2021-05-16 NOTE — Therapy (Addendum)
OUTPATIENT PHYSICAL THERAPY TREATMENT NOTE/DC SUMMARY   Patient Name: Andrew Sullivan MRN: [id number] DOB:05/21/1970, 51 y.o., male Today's Date: 05/16/2021  PCP: Alcus Dad, MD REFERRING PROVIDER: Lenoria Chime, MD PHYSICAL THERAPY DISCHARGE SUMMARY  Visits from Start of Care: 9  Current functional level related to goals / functional outcomes: Goals met, maximum function regained   Remaining deficits: spasms   Education / Equipment: HEP   Patient agrees to discharge. Patient goals were met. Patient is being discharged due to being pleased with the current functional level.   PT End of Session - 05/16/21 1449     Visit Number 9    Number of Visits 9    Date for PT Re-Evaluation 05/22/21    Authorization Type [employer] MCR    Authorization Time Period 04/17/21-05/22/21    Progress Note Due on Visit 9    PT Start Time 1445    PT Stop Time 1530    PT Time Calculation (min) 45 min    Activity Tolerance Patient tolerated treatment well    Behavior During Therapy WFL for tasks assessed/performed              Past Medical History:  Diagnosis Date   Anemia    Erectile dysfunction    Gross hematuria    History of recurrent UTIs    History of spinal cord injury    46 (age 23 fell from ladder) w/ cervical spine fx  s/p  c4 -- c6  fusion--  residual paraplegic bilateral legs (great below T8 uses w/c and can transfer self   Paraparesis of both lower limbs ([facility])    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 78 in 33   Spastic tetraplegia [facility])    Past Surgical History:  Procedure Laterality Date   BIOPSY  06/26/2020   Procedure: BIOPSY;  Surgeon: Jerene Bears, MD;  [facility];  Service: Gastroenterology;;  EGD and COLON   CERVICAL FUSION  1988   C4 -- C6 w/ spinal cord injury   COLONOSCOPY WITH PROPOFOL N/A 06/26/2020   Procedure: COLONOSCOPY WITH PROPOFOL;  Surgeon: Jerene Bears, MD;  [facility];  Service: Gastroenterology;  Laterality:  N/A;   CYSTOSCOPY WITH RETROGRADE PYELOGRAM, URETEROSCOPY AND STENT PLACEMENT Left 12/27/2013   Procedure: CYSTOSCOPY WITH RETROGRADE PYELOGRAM, URETEROSCOPY AND STENT PLACEMENT;  Surgeon: Reece Packer, MD;  Location: [place];  Service: Urology;  Laterality: Left;   CYSTOSCOPY/RETROGRADE/URETEROSCOPY Left 01/09/2014   Procedure: CYSTOSCOPY/RETROGRADE/URETEROSCOPY, URETERAL BIOPSY AND STENT EXCHANGE;  Surgeon: Alexis Frock, MD;  [facility];  Service: Urology;  Laterality: Left;   ESOPHAGOGASTRODUODENOSCOPY (EGD) WITH PROPOFOL N/A 06/26/2020   Procedure: ESOPHAGOGASTRODUODENOSCOPY (EGD) WITH PROPOFOL;  Surgeon: Jerene Bears, MD;  [facility];  Service: Gastroenterology;  Laterality: N/A;   HIP ARTHROPLASTY  08/12/2011   Procedure: ARTHROPLASTY BIPOLAR HIP;  Surgeon: Mauri Pole, MD;  [facility];  Service: Orthopedics;  Laterality: Left;  Hemi-arthroplasty   POLYPECTOMY  06/26/2020   Procedure: POLYPECTOMY;  Surgeon: Jerene Bears, MD;  [facility];  Service: Gastroenterology;;   Patient Active Problem List   Diagnosis Date Noted   Inflamed external hemorrhoid 01/04/2021   Abnormal vision screen 09/25/2020   Abnormal digital rectal exam    Benign neoplasm of transverse colon    Anal polyp    Acute gastritis without hemorrhage    Perianal mass 06/07/2020   Iron deficiency anemia 06/07/2020   Symptomatic anemia 05/13/2019   DVT (deep  venous thrombosis) ([place]) 10/19/2018   Erectile dysfunction    Spastic tetraplegia ([place]) 12/06/2013   History of recurrent UTIs 05/19/2010   Functional quadriplegia ([place]) 05/10/2009    REFERRING DIAG: spastic tetraplegia  THERAPY DIAG:  Chronic spastic tetraplegia (HCC)  Abnormal posture  PERTINENT HISTORY: Returns to [facility]PPT due to increased hamstring tightness and tone related to his Dx.            PRECAUTIONS: fall  SUBJECTIVE: Reports he has been stretching his hamstrings at home as part of  his HEP and continues to attend a gym  PAIN:  Are you having pain? No  OBJECTIVE:    DIAGNOSTIC FINDINGS: no changes   COGNITION: Overall cognitive status: Within functional limits for tasks assessed   SENSATION: Light touch: Appears intact   EDEMA:  None noted   MUSCLE TONE: Increased BLE tone especially in hamstrings     MUSCLE LENGTH: Hamstrings: Right 60 deg; Left 60 deg   DTRs:  Not assessed   POSTURE: rounded shoulders, forward head, decreased lumbar lordosis, increased thoracic kyphosis, and posterior pelvic tilt   AROM/PROM:   PROM Right 04/17/2021 Left 04/17/2021  Hip flexion  [facility]  WFL  Hip extension 10d   10d  Hip abduction      Hip adduction      Hip internal rotation      Hip external rotation      Knee flexion  135d 135d   Knee extension  0d  0d  Ankle dorsiflexion  0d  0d  Ankle plantarflexion  [facility]  WFL  Ankle inversion      Ankle eversion       (Blank rows = not tested) MMT:   MMT Right 04/17/2021 Left 04/17/2021  Hip flexion      Hip extension      Hip abduction      Hip adduction      Hip internal rotation      Hip external rotation      Knee flexion      Knee extension      Ankle dorsiflexion      Ankle plantarflexion      Ankle inversion      Ankle eversion      Not assessed due to tone   BED MOBILITY:  Sit to supine Modified independence Supine to sit Modified independence Rolling to Right Modified independence Rolling to Left Modified independence   TRANSFERS: Assistive device utilized: None  Sit to stand: SBA Stand to sit: SBA Chair to chair: Modified independence Floor:  not tested   RAMP  power WC     GAIT: Gait pattern: Right steppage, Left steppage, scissoring, ataxic, narrow BOS, poor foot clearance- Right, and poor foot clearance- Left Distance walked: 10 Assistive device utilized: Environmental consultant - 2 wheeled Level of assistance: Min A Comments: Steppage gait with decreased B DF as well as hip flexion and knee  extension   FUNCTIONAL TESTs:  5 times sit to stand: 50s   PATIENT SURVEYS:  FOTO 58   TODAY'S TREATMENT:  [facility] Adult PT Treatment:                                                DATE: 05/16/21 Therapeutic Exercise: Supine hip fallouts 15x each with manual facilitation as needed Bridge with PT assist to facilitate quads x15 Seated hip tosses,  OH reach and forward reach using yellow weighted ball 10x each for sitting balance challenges Prone hamstring curls 15x Manual Therapy: STM to B hamstrings, 8 min B seated hamstring stretch 2 min hold from WC B seated gastroc stretch 2 min hold from [facility] Adult PT Treatment:                                                DATE: 05/13/21 Therapeutic Exercise: Supine hip fallouts 15x each with manual facilitation as needed Bridge with PT assist to facilitate quads x15 Seated hip tosses, OH reach and forward reach using yellow weighted ball 10x each for sitting balance challenges Seated LAQ with strap assist   Manual Therapy: STM to B hamstrings, 8 min B seated hamstring stretch 2 min hold from WC B seated gastroc stretch 2 min hold from [facility] Modalities:  HMP to hamstrings in prone (ankles elevated) x 10 minutes     [facility] Adult PT Treatment:                                                DATE: 05/09/21 Therapeutic Exercise: Supine hip fallouts 15x each with manual facilitation as needed Bridge with PT assist to facilitate quads x15 Seated hip tosses, OH reach and forward reach using yellow weighted ball 10x each for sitting balance challenges Supine hamstring curls 15x Seated ball roll fwd and lateral, large physioball, 10x each direction to promote trunk mobility and seated balance Manual Therapy: STM to B hamstrings, 8 min B seated hamstring stretch 2 min hold from WC B seated gastroc stretch 2 min hold from [facility]         PATIENT EDUCATION: Education details: Discussed eval findings, rehab rationale and POC and patient is in agreement   Person educated: Patient Education method: Explanation Education comprehension: verbalized understanding     HOME EXERCISE PROGRAM: Reviewed gym activities   ASSESSMENT:   CLINICAL IMPRESSION: Rehab goals met or function maximized.  Patient more mobile as evidenced by improved STS time and transfer ability.  Patient ready to transition to self care and return as symptoms warrant.    REHAB POTENTIAL: Good   CLINICAL DECISION MAKING: Evolving/moderate complexity   EVALUATION COMPLEXITY: Moderate     GOALS: Goals reviewed with patient? Yes   SHORT TERM GOALS:   STG Name Target Date Goal status  1 Patient to demonstrate independence in HEP Baseline: Hamstring stertching 05/01/2021 Goal met  2 Assess functional reach test to determine sitting balance deficits Baseline: 41/44 05/01/2021 Goal met  LONG TERM GOALS:    LTG Name Target Date Goal status  1 Decrease 5x STS time to 40s Baseline: 50s; 05/16/21 39s 05/15/2021 Met  2 Increase hamstring flexibility to 80d SLR B Baseline: 60d SLR B; 70d B 05/15/2021 Partially met  3 Assess functional reach test goal Baseline: No need 05/15/2021 Met  4 Improve WC/chair transfer to S Baseline: WC/chair transfers require SBA 05/15/2021 Met  PLAN: PT FREQUENCY: 2x/week   PT DURATION: 4 weeks   PLANNED INTERVENTIONS: Therapeutic exercises, Therapeutic activity, Neuro Muscular re-education, Balance training, Gait training, Patient/Family education, Joint mobilization, and Manual therapy   PLAN FOR NEXT SESSION: DC to HEP   [facility] PT Phone:  [phone] Fax: [phone]

## 2021-05-17 NOTE — Patient Instructions (Addendum)
Thank you for taking time to come for your Medicare Wellness Visit. I appreciate your ongoing commitment to your health goals. Please review the following plan we discussed and let me know if I can assist you in the future.    These are the goals we discussed:   Goals      Exercise 3-4x per week     Patient reports going to the gym 2x per week. Would like to increase to 3-4x per week.      Prevent Falls       We also discussed recommended health maintenance. Please call our office and schedule a visit. As discussed, you are due for: Health Maintenance  Topic Date Due   COVID-19 Vaccine (1) Never done   Zoster Vaccines- Shingrix (1 of 2) Never done   TETANUS/TDAP  02/08/2011   INFLUENZA VACCINE  06/07/2021 (Originally 10/08/2020)   COLONOSCOPY (Pts 45-17yr Insurance coverage will need to be confirmed)  06/27/2030   Hepatitis C Screening  Completed   HIV Screening  Completed   HPV VACCINES  Aged Out   Annual exam due in July- please call in June to schedule this. Due for shingles and tetanus vaccines- you can get these at your local pharmacy. Consider covid vaccine series as discussed.  Preventive Care [id number] Old, Male Preventive care refers to lifestyle choices and visits with your health care provider that can promote health and wellness. Preventive care visits are also called wellness exams. What can I expect for my preventive care visit? Counseling During your preventive care visit, your health care provider may ask about your: Medical history, including: Past medical problems. Family medical history. Current health, including: Emotional well-being. Home life and relationship well-being. Sexual activity. Lifestyle, including: Alcohol, nicotine or tobacco, and drug use. Access to firearms. Diet, exercise, and sleep habits. Safety issues such as seatbelt and bike helmet use. Sunscreen use. Work and work eStatistician Physical exam Your health care provider will  check your: Height and weight. These may be used to calculate your BMI (body mass index). BMI is a measurement that tells if you are at a healthy weight. Waist circumference. This measures the distance around your waistline. This measurement also tells if you are at a healthy weight and may help predict your risk of certain diseases, such as type 2 diabetes and high blood pressure. Heart rate and blood pressure. Body temperature. Skin for abnormal spots. What immunizations do I need? Vaccines are usually given at various ages, according to a schedule. Your health care provider will recommend vaccines for you based on your age, medical history, and lifestyle or other factors, such as travel or where you work. What tests do I need? Screening Your health care provider may recommend screening tests for certain conditions. This may include: Lipid and cholesterol levels. Diabetes screening. This is done by checking your blood sugar (glucose) after you have not eaten for a while (fasting). Hepatitis B test. Hepatitis C test. HIV (human immunodeficiency virus) test. STI (sexually transmitted infection) testing, if you are at risk. Lung cancer screening. Prostate cancer screening. Colorectal cancer screening. Talk with your health care provider about your test results, treatment options, and if necessary, the need for more tests. Follow these instructions at home: Eating and drinking  Eat a diet that includes fresh fruits and vegetables, whole grains, lean protein, and low-fat dairy products. Take vitamin and mineral supplements as recommended by your health care provider. Do not drink alcohol if your health care provider tells  you not to drink. If you drink alcohol: Limit how much you have to 0-2 drinks a day. Know how much alcohol is in your drink. In the U.S., one drink equals one 12 oz bottle of beer (355 mL), one 5 oz glass of wine (148 mL), or one 1 oz glass of hard liquor (44  mL). Lifestyle Brush your teeth every morning and night with fluoride toothpaste. Floss one time each day. Exercise for at least 30 minutes 5 or more days each week. Do not use any products that contain nicotine or tobacco. These products include cigarettes, chewing tobacco, and vaping devices, such as e-cigarettes. If you need help quitting, ask your health care provider. Do not use drugs. If you are sexually active, practice safe sex. Use a condom or other form of protection to prevent STIs. Take aspirin only as told by your health care provider. Make sure that you understand how much to take and what form to take. Work with your health care provider to find out whether it is safe and beneficial for you to take aspirin daily. Find healthy ways to manage stress, such as: Meditation, yoga, or listening to music. Journaling. Talking to a trusted person. Spending time with friends and family. Minimize exposure to UV radiation to reduce your risk of skin cancer. Safety Always wear your seat belt while driving or riding in a vehicle. Do not drive: If you have been drinking alcohol. Do not ride with someone who has been drinking. When you are tired or distracted. While texting. If you have been using any mind-altering substances or drugs. Wear a helmet and other protective equipment during sports activities. If you have firearms in your house, make sure you follow all gun safety procedures. What's next? Go to your health care provider once a year for an annual wellness visit. Ask your health care provider how often you should have your eyes and teeth checked. Stay up to date on all vaccines. This information is not intended to replace advice given to you by your health care provider. Make sure you discuss any questions you have with your health care provider. Document Revised: 08/22/2020 Document Reviewed: 08/22/2020 Elsevier Patient Education  2022 [place] Prevention in the  Home, Adult Falls can cause injuries and can happen to people of all ages. There are many things you can do to make your home safe and to help prevent falls. Ask for help when making these changes. What actions can I take to prevent falls? General Instructions Use good lighting in all rooms. Replace any light bulbs that burn out. Turn on the lights in dark areas. Use night-lights. Keep items that you use often in easy-to-reach places. Lower the shelves around your home if needed. Set up your furniture so you have a clear path. Avoid moving your furniture around. Do not have throw rugs or other things on the floor that can make you trip. Avoid walking on wet floors. If any of your floors are uneven, fix them. Add color or contrast paint or tape to clearly mark and help you see: Grab bars or handrails. First and last steps of staircases. Where the edge of each step is. If you use a stepladder: Make sure that it is fully opened. Do not climb a closed stepladder. Make sure the sides of the stepladder are locked in place. Ask someone to hold the stepladder while you use it. Know where your pets are when moving through your home. What can I  do in the bathroom?   Keep the floor dry. Clean up any water on the floor right away. Remove soap buildup in the tub or shower. Use nonskid mats or decals on the floor of the tub or shower. Attach bath mats securely with double-sided, nonslip rug tape. If you need to sit down in the shower, use a plastic, nonslip stool. Install grab bars by the toilet and in the tub and shower. Do not use towel bars as grab bars. What can I do in the bedroom? Make sure that you have a light by your bed that is easy to reach. Do not use any sheets or blankets for your bed that hang to the floor. Have a firm chair with side arms that you can use for support when you get dressed. What can I do in the kitchen? Clean up any spills right away. If you need to reach something  above you, use a step stool with a grab bar. Keep electrical cords out of the way. Do not use floor polish or wax that makes floors slippery. What can I do with my stairs? Do not leave any items on the stairs. Make sure that you have a light switch at the top and the bottom of the stairs. Make sure that there are handrails on both sides of the stairs. Fix handrails that are broken or loose. Install nonslip stair treads on all your stairs. Avoid having throw rugs at the top or bottom of the stairs. Choose a carpet that does not hide the edge of the steps on the stairs. Check carpeting to make sure that it is firmly attached to the stairs. Fix carpet that is loose or worn. What can I do on the outside of my home? Use bright outdoor lighting. Fix the edges of walkways and driveways and fix any cracks. Remove anything that might make you trip as you walk through a door, such as a raised step or threshold. Trim any bushes or trees on paths to your home. Check to see if handrails are loose or broken and that both sides of all steps have handrails. Install guardrails along the edges of any raised decks and porches. Clear paths of anything that can make you trip, such as tools or rocks. Have leaves, snow, or ice cleared regularly. Use sand or salt on paths during winter. Clean up any spills in your garage right away. This includes grease or oil spills. What other actions can I take? Wear shoes that: Have a low heel. Do not wear high heels. Have rubber bottoms. Feel good on your feet and fit well. Are closed at the toe. Do not wear open-toe sandals. Use tools that help you move around if needed. These include: Canes. Walkers. Scooters. Crutches. Review your medicines with your doctor. Some medicines can make you feel dizzy. This can increase your chance of falling. Ask your doctor what else you can do to help prevent falls. Where to find more information Centers for Disease Control and  Prevention, [place]: http://www.wolf.info/ National Institute on Aging: http://kim-miller.com/ Contact a doctor if: You are afraid of falling at home. You feel weak, drowsy, or dizzy at home. You fall at home. Summary There are many simple things that you can do to make your home safe and to help prevent falls. Ways to make your home safe include removing things that can make you trip and installing grab bars in the bathroom. Ask for help when making these changes in your home. This information  is not intended to replace advice given to you by your health care provider. Make sure you discuss any questions you have with your health care provider. Document Revised: 09/28/2019 Document Reviewed: 09/28/2019 Elsevier Patient Education  2022 [employer].  Our clinic's number is [phone]. Please call with questions or concerns about what we discussed today.

## 2021-05-27 DIAGNOSIS — Z0000 Encounter for general adult medical examination without abnormal findings: Secondary | ICD-10-CM | POA: Diagnosis not present

## 2021-05-27 DIAGNOSIS — D649 Anemia, unspecified: Secondary | ICD-10-CM | POA: Diagnosis not present

## 2021-05-27 DIAGNOSIS — Z87828 Personal history of other (healed) physical injury and trauma: Secondary | ICD-10-CM | POA: Diagnosis not present

## 2021-05-27 DIAGNOSIS — M62838 Other muscle spasm: Secondary | ICD-10-CM | POA: Diagnosis not present

## 2021-06-04 ENCOUNTER — Other Ambulatory Visit: Payer: Self-pay | Admitting: Family Medicine

## 2021-06-04 DIAGNOSIS — G8250 Quadriplegia, unspecified: Secondary | ICD-10-CM

## 2021-06-04 DIAGNOSIS — R532 Functional quadriplegia: Secondary | ICD-10-CM

## 2021-06-12 ENCOUNTER — Ambulatory Visit (INDEPENDENT_AMBULATORY_CARE_PROVIDER_SITE_OTHER): Payer: Medicare HMO | Admitting: Family Medicine

## 2021-06-12 ENCOUNTER — Encounter: Payer: Self-pay | Admitting: Family Medicine

## 2021-06-12 VITALS — BP 106/70 | HR 81 | Ht 75.0 in

## 2021-06-12 DIAGNOSIS — D509 Iron deficiency anemia, unspecified: Secondary | ICD-10-CM

## 2021-06-12 DIAGNOSIS — G8250 Quadriplegia, unspecified: Secondary | ICD-10-CM | POA: Diagnosis not present

## 2021-06-12 DIAGNOSIS — R532 Functional quadriplegia: Secondary | ICD-10-CM

## 2021-06-12 MED ORDER — BACLOFEN 20 MG PO TABS: 20.0000 mg | ORAL_TABLET | Freq: Four times a day (QID) | ORAL | 3 refills | Status: DC

## 2021-06-12 MED ORDER — FERROUS SULFATE 325 (65 FE) MG PO TABS: 325.0000 mg | ORAL_TABLET | ORAL | 3 refills | Status: DC

## 2021-06-12 NOTE — Progress Notes (Signed)
? ? ?  SUBJECTIVE:  ? ?CHIEF COMPLAINT / HPI:  ? ?Spastic quadriplegia ?- Patient presents for refill of baclofen ?- Previously given 30 day supply and told he needed to return for evaluation ?- Patient reports he has been stable on baclofen 4 times daily since his accident in 55 ?-Tolerating well without adverse side effects ? ?Iron deficiency anemia ?- On daily iron supplementation ?- Last CBC 11/13/2020 demonstrated hemoglobin 13.5, MCV 97.7 ?- Patient UTD colonoscopy, last performed 06/26/2020, duodenum, and rectal surgical path returned benign ?- Stomach biopsy returned positive for Helicobacter pylori infection with chronic active gastritis ? ?PERTINENT  PMH / PSH: Spastic quadriplegia, iron deficiency anemia, erectile dysfunction, DVT, external hemorrhoid ? ?OBJECTIVE:  ? ?BP 106/70   Pulse 81   Ht '6\' 3"'$  (1.905 m)   SpO2 96%   BMI 21.12 kg/m?   ? ?PHQ-9:  ? ?  06/12/2021  ?  3:23 PM 05/15/2021  ?  2:26 PM 03/27/2021  ?  2:43 PM  ?Depression screen PHQ 2/9  ?Decreased Interest 0 0 0  ?Down, Depressed, Hopeless 0 0 0  ?PHQ - 2 Score 0 0 0  ?Altered sleeping 0 0 0  ?Tired, decreased energy '2 1 1  '$ ?Change in appetite 0 0 0  ?Feeling bad or failure about yourself  0 0 0  ?Trouble concentrating 0 0 0  ?Moving slowly or fidgety/restless 0 0 0  ?Suicidal thoughts 0 0 0  ?PHQ-9 Score '2 1 1  '$ ?Difficult doing work/chores Not difficult at all  Not difficult at all  ?  ?Physical Exam ?General: Awake, alert, oriented, in power wheelchair ?Cardiovascular: Regular rate and rhythm, S1 and S2 present, no murmurs auscultated ?Respiratory: Lung fields clear to auscultation bilaterally ? ?ASSESSMENT/PLAN:  ? ?Spastic tetraplegia ([place]) ?Stable. Refilled baclofen, provided 90-day supply with 3 refills for total of 1 year supply. ? ?Iron deficiency anemia ?Stable.  Last CBC September 2022, no indication for repeat at this time.  UTD on colonoscopy.  Will provide 90-day supply with 3 refills. ?  ? ? ?Ezequiel Essex, MD ?[place]  ?

## 2021-06-12 NOTE — Patient Instructions (Signed)
It was wonderful to meet you today. Thank you for allowing me to be a part of your care. Below is a short summary of what we discussed at your visit today: ? ?Medication refills ?I have sent a year supply of your iron supplementation and also your baclofen.  Please let us know if you have any issues at the pharmacy. ? ?Foot care ?I have sent the referral to podiatry as requested. Someone from their office should be calling you in 1 to 2 weeks to schedule an appointment.  If you do not hear from them, let us know. We may need to nudge along the referral.   ? ?Please bring all of your medications to every appointment! ? ?If you have any questions or concerns, please do not hesitate to contact us via phone or MyChart message.  ? ?Ezequiel Essex, MD  ? ?Below are community resources I like to tell everyone about: ? ?Cooking and Nutrition Classes ?The [place] [employer] in [place] provides many classes at low or no cost to [employer], nutrition, and agriculture.  Their website offers a huge variety of information related to topics such as gardening, nutrition, cooking, parenting, and health.  Also listed are classes and events, both online and in-person.  Check out their website here: [email or web address]  ? ?Food finder app ?Download the [employer] App or Call 211 to easily find food banks and pantries and other resources nearby.  ? ?Employment / Therapist, occupational ?[employer] of [place]: [phone] / [place] services (Client preference), Accepting New clients  ?[employer] Artist ([place]): [phone] ([place]) / [phone] ([place]) Virtual & Onsite workshops, Accepting new clients  ?[place]: [phone] / [phone] Virtual & Onsite , Accepting New clients  ?[place]: [phone]    ?DHHS Work First: [phone] ([place]) / [phone] ([place])  Virtual, Accepting clients  ?[place]:  [phone] Virtual and Onsite, Accepting new clients   ? ?Financial Assistance ?[place]:  [phone] Virtual (financial assistance) & Onsite (all other services), Accepting new families  ?[employer]: [phone] Virtual  ?Delsa Bern (furniture):  [phone]   ?[place] [employer]: [phone]   ?Low Income Energy Assistance  [phone] Virtual, accepting new applicants  ? ?Food Assistance ?DHHS- SNAP/ Food Stamps: [phone] Virtual  ?WIC: [phone] ;  [place] [phone]   ?[place]   ?[place]   ?During the summer, text "FOOD" to [phone]   ? ?General Health / Clinics (Adults) ?[place] Card (for Adults) through [employer]: [phone]   ?[place]:   [phone]   ?[place]:   [phone]   ?Health Department:  [phone] Onsite, accepting new patients  ?[facility] [place]:   [phone] Onsite, accepting new clients  ?[facility]:   [phone] x [phone] Onsite, accepting new clients  ?  ?Housing ?[place]:   [phone]  ?[place]:  [phone]  ?Affordable Housing Management:  [phone]  ?[place]:  [phone]  ?Solicitor / [facility] [place]:  [phone] / [phone]   ?[place]:  [phone]  ?Housing ?The CARES Act temporarily banned evictions and late fees  until July 25th (Saturday). Below are some resources and programs in [employer] for folks to look to for assistance with back payments of rent and  other ways of getting help to remain in their homes.  ?Additional Resources: ?[employer] ([place] enclosed) ?[place] ([place] enclosed) ?[employer] [phone] ?[employer] [phone] ?[employer] [phone] ?Nurse, learning disability and Housing Assistance ?[employer] is primarily [employer].  [place] is [place] only.  In [facility], [place] and [employer] provide assistance.  The link shows some agencies providing assistance in other counties. If you are aware of others, please share.  ?  ? ?Transportation ?Medicaid Transportation: [phone] to apply  ?[place]: [phone] (reduced-fare bus ID to [place]  ?SCAT Paratransit services: Eligible riders only, call [phone] for application  ? ?Childcare ?[place] Child Development: [phone] ([place]) / [phone] ([place]) ? - Child Care Resources/ Referrals/ Scholarships ? - Head Start/ Early Head Start (call or apply online)  ?[place] DHHS: [place] Pre-K :  [phone] / [phone]  ? ?Food Banks ?Immigrant Assistance and [place] by [employer]  ?Hours: 9:00am-5:00pm (Monday - Friday) ?[address], [place], [place], [address] ?[place]: [phone] ?Direct number for Case worker Raquel Sarna: [phone]  ?*leave a voicemail with name, specifics on the need for assistance, and call back number* ? ?[place] [employer] ?Hours: 10:00am-1:00pm, no appointment needed ?Just bring a valid photo ID ?[place], [place]; [phone] ? ?One Step Further ?The grocery assistance program operates a "drive through" system: ?[place]: Monday-Thursday 9:30am-2:30pm (please be in line by 2:15pm; closed the 2nd Wednesday of each month) ?[place]: 2nd and 3rd Friday 11am-2pm (please be in line by 1:45pm) ?If you are a walker, bus rider, or SCAT rider, we will have an expedited line. Please make sure that you remain at least 6 feet from another walker.  ?Bring any form of ID (social security card NOT required); No appointment needed ?[address] [place], [place] [address]; [phone] ? ?[employer]  ?Every fourth Saturday 9am-11am (arrive early to get in line) ?Bring photo ID ?[phone] [id number] ? ?Free Indeed [employer] Pantry ?The location is announced the week of the food drive. Check the website and Facebook for the exact address. No appointment needed.   ?Bring photo ID ?[phone]) [id number] ?[email or web address]  ? ?[place] [address]: [employer] Life Food Pantry  ?All Are Welcome, - bring a valid photo ID, ?Hours: Monday 10-2pm, Tuesday and Thursday 1-4pm by appointment only.  ?[address] [place] [address]. ?Call for appointment: [phone] (responds 24-48hrs after initial contact) ? ?Hot Meals ?[facility] ?Serving meals 7 days a week from 10:30 am until 12:30 pm, no questions asked! Currently, we are not serving meals in the dining area so we can ensure social distancing. All meals are served in to-go containers. Come to [address] if you would like to receive lunch! ?[employer]imentos ?[facility] [employer] in [place]  ?[place]: 9:00am a5:00pm (lunes a viernes) ?Direcci?n: [address] [place], [place], [address] ?Tel?fono: [phone], N?mero directo de la coordinadora Raquel Sarna: [phone])- [phone]  ?Favor de dejar un mensaje de voz con su nombre, el tipo de asistencia que [place], y su n?mero de tel?fono donde se le puede regresar la llamada ? ?[employer] ?Horas: 10:00am a 1:00pm, no se requiere cita ?Solo presente una identificaci?n v?lida con foto ?Direcci?n: [place], [place] ?Tel?fono: [phone] ? ?One Step  Further ?El programa de [place] con despensa operar? a Geographical information systems officer de ?drive through?: ?[place]: lunes-jueves 9:30am a 2:30pm (favor de estar en la fila antes de las 2:15 pm; estar? cerrado el 2do mi?rcoles de cada mes) ?Si usted llega a pie o por medio de autobus/SCAT, tendremos una fila diferente y mas r?pida. Por favor  aseg?rese de que mantenga 6 pies de distancia de [place].  ?Favor de traer cualquier tipo de identificaci?n (no se requiere tener tarjeta de seguro social) ?No se requiere cita ?[place]

## 2021-06-13 NOTE — Assessment & Plan Note (Signed)
Stable.  Last CBC September 2022, no indication for repeat at this time.  UTD on colonoscopy.  Will provide 90-day supply with 3 refills. ?

## 2021-06-13 NOTE — Assessment & Plan Note (Signed)
Stable. Refilled baclofen, provided 90-day supply with 3 refills for total of 1 year supply. ?

## 2021-06-26 ENCOUNTER — Ambulatory Visit (INDEPENDENT_AMBULATORY_CARE_PROVIDER_SITE_OTHER): Payer: Medicare HMO | Admitting: Podiatry

## 2021-06-26 DIAGNOSIS — M79674 Pain in right toe(s): Secondary | ICD-10-CM | POA: Diagnosis not present

## 2021-06-26 DIAGNOSIS — M79675 Pain in left toe(s): Secondary | ICD-10-CM | POA: Diagnosis not present

## 2021-06-26 DIAGNOSIS — B351 Tinea unguium: Secondary | ICD-10-CM

## 2021-06-26 DIAGNOSIS — B353 Tinea pedis: Secondary | ICD-10-CM | POA: Diagnosis not present

## 2021-06-26 MED ORDER — CLOTRIMAZOLE-BETAMETHASONE 1-0.05 % EX CREA: 1.0000 "application " | TOPICAL_CREAM | Freq: Two times a day (BID) | CUTANEOUS | 1 refills | Status: DC

## 2021-06-26 MED ORDER — TERBINAFINE HCL 250 MG PO TABS: 250.0000 mg | ORAL_TABLET | Freq: Every day | ORAL | 0 refills | Status: DC

## 2021-06-26 NOTE — Progress Notes (Signed)
? ?  SUBJECTIVE ?Patient PMHx quadriplegia since 1988 presents to office today complaining of elongated, thickened nails that cause pain while ambulating in shoes.  Patient is unable to trim their own nails.  Patient also states that he gets peeling skin with itching to the interdigital areas of the feet.  He would like to treat the fungus and discuss treatment options for the toenails patient is here for further evaluation and treatment. ? ?Past Medical History:  ?Diagnosis Date  ? Anemia   ? Erectile dysfunction   ? Gross hematuria   ? History of recurrent UTIs   ? History of spinal cord injury   ? 30 (age 27 fell from ladder) w/ cervical spine fx  s/p  c4 -- c6  fusion--  residual paraplegic bilateral legs (great below T8 uses w/c and can transfer self  ? Paraparesis of both lower limbs ([place])   ? 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 49 in 12  ? Spastic tetraplegia ([place])   ? ? ?OBJECTIVE ?General Patient is awake, alert, and oriented x 3 and in no acute distress. ?Derm Skin is dry and supple bilateral. Negative open lesions or macerations. Remaining integument unremarkable. Nails are tender, long, thickened and dystrophic with subungual debris, consistent with onychomycosis, 1-5 bilateral. No signs of infection noted.  There is some hyperkeratosis of skin with peeling and itching to the interdigital areas of the feet bilateral ?Vasc  DP and PT pedal pulses palpable bilaterally. Temperature gradient within normal limits.  ?Neuro Epicritic and protective threshold sensation grossly intact bilaterally.  ?Musculoskeletal Exam No symptomatic pedal deformities noted bilateral. Muscular strength within normal limits. ? ?ASSESSMENT ?1.  Pain due to onychomycosis of toenails both ?2.  Tinea pedis both ? ?PLAN OF CARE ?1. Patient evaluated today.  ?2. Instructed to maintain good pedal hygiene and foot care.  ?3. Mechanical debridement of nails 1-5 bilaterally performed using a nail nipper. Filed with dremel without  incident.  ?4.  Today we discussed different treatment options for onychomycosis and tinea pedis of the feet.  Patient gets routine blood work which has been normal.  He denies a history of liver pathology or symptoms.  We discussed both topical, oral, and laser antifungal treatment modalities.  The patient opts for oral antifungal treatment.  Efficacies and side effects were discussed in detail.   ?5.  Prescription for Lamisil 2 and 50 mg #90 daily  ?6.  Prescription for Lotrisone cream to apply to the interdigital areas daily  ?7.  OTC Tolcylen antifungal topical dispensed at checkout  ?8.  Return to clinic in 3 mos.  ? ? ?Edrick Kins, DPM ?[place] ? ?Dr. Edrick Kins, DPM  ?  ?[address]                                     ?[place], [place] [address]                ?Office [phone]  ?Fax [phone] ? ? ? ? ?

## 2021-07-15 ENCOUNTER — Ambulatory Visit (INDEPENDENT_AMBULATORY_CARE_PROVIDER_SITE_OTHER): Payer: Medicare HMO | Admitting: Family Medicine

## 2021-07-15 ENCOUNTER — Encounter: Payer: Self-pay | Admitting: Family Medicine

## 2021-07-15 VITALS — BP 128/70 | HR 86

## 2021-07-15 DIAGNOSIS — H6123 Impacted cerumen, bilateral: Secondary | ICD-10-CM

## 2021-07-15 DIAGNOSIS — G8250 Quadriplegia, unspecified: Secondary | ICD-10-CM | POA: Diagnosis not present

## 2021-07-15 DIAGNOSIS — I82401 Acute embolism and thrombosis of unspecified deep veins of right lower extremity: Secondary | ICD-10-CM

## 2021-07-15 NOTE — Patient Instructions (Addendum)
It was great to see you! ? ?I have placed an order for a hospital bed. Please allow a few weeks for insurance and the supply company to figure out details. ? ?The next time you need a refill on your baclofen, please call your pharmacy to request a refill. They will reach out to me directly. No need to be seen in the office for refills unless you have other concerns. ? ? ?Take care, ? ?Dr. Rock Nephew ?[facility]  ?

## 2021-07-15 NOTE — Assessment & Plan Note (Signed)
Stable on chronic baclofen. Wheelchair bound at baseline. DME orders placed for hospital bed, as patient requires frequent repositioning to prevent pressure ulcers ?

## 2021-07-15 NOTE — Assessment & Plan Note (Signed)
H/o R leg DVT x2. Most recently in August of 2022. Not currently on anticoagulation. Favor lifelong anticoagulation due to paraplegia, but patient insistent that he is not immobilized for any prolonged amount of time. Has not undergone hypercoaguable workup. UTD on age-appropriate cancer screening. He wishes to discuss further with hematology. Referral placed. ?

## 2021-07-15 NOTE — Progress Notes (Signed)
? ? ?  SUBJECTIVE:  ? ?CHIEF COMPLAINT / HPI:  ? ?Discuss Hospital Bed ?Patient requesting hospital bed ?Currently using air bed which gets holes in it every few months. Has had that bed for ~5 years ?Requires frequent repositioning in order to prevent bed sores due to h/o quadriplegia ?H/o spastic quadriplegia s/p ladder accident in 1988 ? ?Clogged Ears ?Both ears feel clogged ?He routinely gets them cleaned out due to wax build up ?Tries to use a rag to clean them in the shower, some wax comes out ?Does not use Q-tips ?No significant pain, no hearing loss ? ?H/o DVT x2 ?R leg DVT diagnosed August 2020. Took Xarelto x 3 months ?Again had R leg DVT in August 2022. Diagnosed in the ED and started on Eliquis ?Took Eliquis "until he ran out" (based on chart review, ~2 month supply provided) ?Patient wants to know why he gets blood clots-- does not feel he has prolonged immobilization. States he uses a glider to get around at home, goes to the gym at least twice weekly. ? ?PERTINENT  PMH / PSH: quadriplegia ? ?OBJECTIVE:  ? ?BP 128/70   Pulse 86   SpO2 98%   ?General: NAD, pleasant ?Ears: bilateral cerumen impaction ?Respiratory: No respiratory distress ?Skin: warm and dry, no rashes noted ?Psych: Normal affect and mood ? ? ?ASSESSMENT/PLAN:  ? ?DVT (deep venous thrombosis) ([place]) ?H/o R leg DVT x2. Most recently in August of 2022. Not currently on anticoagulation. Favor lifelong anticoagulation due to paraplegia, but patient insistent that he is not immobilized for any prolonged amount of time. Has not undergone hypercoaguable workup. UTD on age-appropriate cancer screening. He wishes to discuss further with hematology. Referral placed. ? ?Spastic tetraplegia ([place]) ?Stable on chronic baclofen. Wheelchair bound at baseline. DME orders placed for hospital bed, as patient requires frequent repositioning to prevent pressure ulcers ?  ?Bilateral Cerumen Impaction ?Wax removed today with warm water irrigation and curette.   ? ? ?Andrew Dad, MD ?[place]  ?

## 2021-07-16 NOTE — Progress Notes (Signed)
Community message sent to adapt.  Hayato Guaman,CMA ? ?

## 2021-07-22 ENCOUNTER — Telehealth: Payer: Self-pay | Admitting: Hematology and Oncology

## 2021-07-22 NOTE — Telephone Encounter (Signed)
Scheduled appt per 5/8 referral. Pt is aware of appt date and time. Pt is aware to arrive 15 mins prior to appt time and to bring and updated insurance card. Pt is aware of appt location.   ?

## 2021-07-25 DIAGNOSIS — Z125 Encounter for screening for malignant neoplasm of prostate: Secondary | ICD-10-CM | POA: Diagnosis not present

## 2021-08-01 DIAGNOSIS — R310 Gross hematuria: Secondary | ICD-10-CM | POA: Diagnosis not present

## 2021-08-01 DIAGNOSIS — N319 Neuromuscular dysfunction of bladder, unspecified: Secondary | ICD-10-CM | POA: Diagnosis not present

## 2021-08-07 ENCOUNTER — Other Ambulatory Visit: Payer: Self-pay

## 2021-08-07 ENCOUNTER — Inpatient Hospital Stay: Payer: Medicare HMO | Attending: Hematology and Oncology | Admitting: Hematology and Oncology

## 2021-08-07 ENCOUNTER — Inpatient Hospital Stay: Payer: Medicare HMO

## 2021-08-07 VITALS — BP 139/63 | HR 70 | Temp 98.1°F | Resp 17 | Ht 75.0 in

## 2021-08-07 DIAGNOSIS — I82501 Chronic embolism and thrombosis of unspecified deep veins of right lower extremity: Secondary | ICD-10-CM

## 2021-08-07 DIAGNOSIS — D500 Iron deficiency anemia secondary to blood loss (chronic): Secondary | ICD-10-CM

## 2021-08-07 DIAGNOSIS — Z87891 Personal history of nicotine dependence: Secondary | ICD-10-CM

## 2021-08-07 DIAGNOSIS — Z7901 Long term (current) use of anticoagulants: Secondary | ICD-10-CM | POA: Diagnosis not present

## 2021-08-07 DIAGNOSIS — I82461 Acute embolism and thrombosis of right calf muscular vein: Secondary | ICD-10-CM

## 2021-08-07 LAB — CMP (CANCER CENTER ONLY)
ALT: 20 U/L (ref 0–44)
AST: 27 U/L (ref 15–41)
Albumin: 4.1 g/dL (ref 3.5–5.0)
Alkaline Phosphatase: 48 U/L (ref 38–126)
Anion gap: 6 (ref 5–15)
BUN: 27 mg/dL — ABNORMAL HIGH (ref 6–20)
CO2: 25 mmol/L (ref 22–32)
Calcium: 9.2 mg/dL (ref 8.9–10.3)
Chloride: 110 mmol/L (ref 98–111)
Creatinine: 1.25 mg/dL — ABNORMAL HIGH (ref 0.61–1.24)
GFR, Estimated: >60 mL/min (ref >60)
Glucose, Bld: 99 mg/dL (ref 70–99)
Potassium: 4.2 mmol/L (ref 3.5–5.1)
Sodium: 141 mmol/L (ref 135–145)
Total Bilirubin: 0.9 mg/dL (ref 0.3–1.2)
Total Protein: 7.1 g/dL (ref 6.5–8.1)

## 2021-08-07 LAB — CBC WITH DIFFERENTIAL (CANCER CENTER ONLY)
Abs Immature Granulocytes: 0 10*3/uL (ref 0.00–0.07)
Basophils Absolute: 0 10*3/uL (ref 0.0–0.1)
Basophils Relative: 0 %
Eosinophils Absolute: 0.1 10*3/uL (ref 0.0–0.5)
Eosinophils Relative: 4 %
HCT: 43.5 % (ref 39.0–52.0)
Hemoglobin: 14.5 g/dL (ref 13.0–17.0)
Immature Granulocytes: 0 %
Lymphocytes Relative: 22 %
Lymphs Abs: 0.8 10*3/uL (ref 0.7–4.0)
MCH: 30.9 pg (ref 26.0–34.0)
MCHC: 33.3 g/dL (ref 30.0–36.0)
MCV: 92.6 fL (ref 80.0–100.0)
Monocytes Absolute: 0.4 10*3/uL (ref 0.1–1.0)
Monocytes Relative: 12 %
Neutro Abs: 2.4 10*3/uL (ref 1.7–7.7)
Neutrophils Relative %: 62 %
Platelet Count: 181 10*3/uL (ref 150–400)
RBC: 4.7 MIL/uL (ref 4.22–5.81)
RDW: 12.2 % (ref 11.5–15.5)
WBC Count: 3.8 10*3/uL — ABNORMAL LOW (ref 4.0–10.5)
nRBC: 0 % (ref 0.0–0.2)

## 2021-08-07 MED ORDER — RIVAROXABAN 10 MG PO TABS: 10.0000 mg | ORAL_TABLET | Freq: Every day | ORAL | 2 refills | Status: DC

## 2021-08-07 NOTE — Progress Notes (Signed)
[facility] [phone]   Fax:([phone] [place] NOTE  Patient Care Team: Alcus Dad, MD as PCP - General (Family Medicine) Alexis Frock, MD as Consulting Physician (Urology) Clent Jacks, MD as Referring Physician (Ophthalmology) Hilarie Fredrickson Lajuan Lines, MD as Consulting Physician (Gastroenterology)  Hematological/Oncological History # Right Lower Extremity DVT  10/14/2018: Lower Extremity US showed acute deep vein thrombosis involving the right femoral vein, right popliteal vein, right posterior tibial veins, and right peroneal veins. 10/22/2020: Lower Extremity US showed an acute deep vein thrombosis involving the right posterior tibial veins, and right gastrocnemius veins 08/07/2021: establish care with Dr. Lorenso Courier   CHIEF COMPLAINTS/PURPOSE OF CONSULTATION:  "Right Lower Extremity DVT  "  HISTORY OF PRESENTING ILLNESS:  [name] 51 y.o. male with medical history significant for cervical spine fracture in 1988 with paraparesis in both lower extremities who presents for evaluation of recurrent right lower extremity DVT.   On review of the previous records [name] had an initial DVT diagnosed in August 2020.  Ultrasound at the time showed acute deep vein thrombosis involving the right femoral vein, right popliteal vein, right posterior tibial veins, and right peroneal veins.  He was treated with also therapy for limited duration of time.  More recently on 10/23/2018 the patient developed DVT once again involving the right posterior tibial veins, right gastrocnemius veins.  He was started on Eliquis therapy and then discontinued after 6 months time.  Due to concern for his history of recurrent VTE's he was referred to hematology for further evaluation and management.  On exam today [name] is a 51 year old male who presents to the hematology clinic for initial evaluation of two unprovoked DVTs in 2020 and 2022, both in the R popliteal  fossa. PMH is pertinent for Iron Deficiency Anemia. He took Xarelto in 2020 for his first unprovoked DVT and was on Eliquis in 2022 for his second. He has since stopped Eliquis due to not wanting to take a blood thinner. He does not endorse any risk factors such as smoking, illness, immobility, or surgery that could have provoked a blood clot. His family history is negative for clotting disorders, strokes, or anemia that he is aware of. He suffered a neck fracture in 1988. He uses a wheelchair, but is able to get up during the day and use his walker and also goes to [employer] 2-3 times a week. He is positive for fatigue, weakness, constant lower extremity edema stemming from his neck fracture, and easy bruising. All other ROS are negative.  MEDICAL HISTORY:  Past Medical History:  Diagnosis Date   Anemia    Erectile dysfunction    Gross hematuria    History of recurrent UTIs    History of spinal cord injury    79 (age 54 fell from ladder) w/ cervical spine fx  s/p  c4 -- c6  fusion--  residual paraplegic bilateral legs (great below T8 uses w/c and can transfer self   Paraparesis of both lower limbs ([place])    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 8 in 73   Spastic tetraplegia ([place])     SURGICAL HISTORY: Past Surgical History:  Procedure Laterality Date   BIOPSY  06/26/2020   Procedure: BIOPSY;  Surgeon: Jerene Bears, MD;  Location: [facility];  Service: Gastroenterology;;  EGD and COLON   CERVICAL FUSION  1988   C4 -- C6 w/ spinal cord injury   COLONOSCOPY WITH PROPOFOL N/A 06/26/2020  Procedure: COLONOSCOPY WITH PROPOFOL;  Surgeon: Jerene Bears, MD;  Location: [facility];  Service: Gastroenterology;  Laterality: N/A;   CYSTOSCOPY WITH RETROGRADE PYELOGRAM, URETEROSCOPY AND STENT PLACEMENT Left 12/27/2013   Procedure: CYSTOSCOPY WITH RETROGRADE PYELOGRAM, URETEROSCOPY AND STENT PLACEMENT;  Surgeon: Reece Packer, MD;  Location: [facility];  Service:  Urology;  Laterality: Left;   CYSTOSCOPY/RETROGRADE/URETEROSCOPY Left 01/09/2014   Procedure: CYSTOSCOPY/RETROGRADE/URETEROSCOPY, URETERAL BIOPSY AND STENT EXCHANGE;  Surgeon: Alexis Frock, MD;  Location: [facility];  Service: Urology;  Laterality: Left;   ESOPHAGOGASTRODUODENOSCOPY (EGD) WITH PROPOFOL N/A 06/26/2020   Procedure: ESOPHAGOGASTRODUODENOSCOPY (EGD) WITH PROPOFOL;  Surgeon: Jerene Bears, MD;  Location: [facility];  Service: Gastroenterology;  Laterality: N/A;   HIP ARTHROPLASTY  08/12/2011   Procedure: ARTHROPLASTY BIPOLAR HIP;  Surgeon: Mauri Pole, MD;  Location: [facility];  Service: Orthopedics;  Laterality: Left;  Hemi-arthroplasty   POLYPECTOMY  06/26/2020   Procedure: POLYPECTOMY;  Surgeon: Jerene Bears, MD;  Location: [facility];  Service: Gastroenterology;;    SOCIAL HISTORY: Social History   Socioeconomic History   Marital status: Single    Spouse name: Not on file   Number of children: 0   Years of education: 12   Highest education level: 12th grade  Occupational History   Occupation: Disability  Tobacco Use   Smoking status: Former    Packs/day: 0.25    Years: 0.50    Pack years: 0.13    Types: Cigarettes    Quit date: 12/27/1986    Years since quitting: 34.6    Passive exposure: Past   Smokeless tobacco: Never  Vaping Use   Vaping Use: Never used  Substance and Sexual Activity   Alcohol use: No    Comment: hx of use 1980s   Drug use: No   Sexual activity: Not Currently  Other Topics Concern   Not on file  Social History Narrative   Patient lives with his mother and step father.   Is on disability secondary to neck fracture.    Stays active going to gym and church. Uses [employer] for transportation or local bus.   Enjoys going to the movies and playing cards.    Social Determinants of Health   Financial Resource Strain: Low Risk    Difficulty of Paying Living Expenses: Not hard at all  Food Insecurity: No Food Insecurity   Worried About Paediatric nurse in the Last Year: Never true   [place] in the Last Year: Never true  Transportation Needs: No Transportation Needs   Lack of Transportation (Medical): No   Lack of Transportation (Non-Medical): No  Physical Activity: Insufficiently Active   Days of Exercise per Week: 2 days   Minutes of Exercise per Session: 60 min  Stress: No Stress Concern Present   Feeling of Stress : Not at all  Social Connections: Moderately Integrated   Frequency of Communication with Friends and Family: Twice a week   Frequency of Social Gatherings with Friends and Family: Twice a week   Attends Religious Services: More than 4 times per year   Active Member of [employer] or Organizations: Yes   Attends Music therapist: More than 4 times per year   Marital Status: Never married  Human resources officer Violence: Not At Risk   Fear of Current or Ex-Partner: No   Emotionally Abused: No   Physically Abused: No   Sexually Abused: No    FAMILY HISTORY: Family History  Problem Relation  Age of Onset   Arthritis Mother    Hypertension Mother    Colon cancer Neg Hx    Esophageal cancer Neg Hx    Pancreatic cancer Neg Hx    Stomach cancer Neg Hx    Liver disease Neg Hx     ALLERGIES:  is allergic to penicillins.  MEDICATIONS:  Current Outpatient Medications  Medication Sig Dispense Refill   rivaroxaban (XARELTO) 10 MG TABS tablet Take 1 tablet (10 mg total) by mouth daily. 90 tablet 2   baclofen (LIORESAL) 20 MG tablet Take 1 tablet (20 mg total) by mouth 4 (four) times daily. 360 tablet 3   Blood Glucose Monitoring Suppl (TRUE METRIX METER) DEVI Use to check blood sugar up to 3 times daily (Patient not taking: Reported on 05/15/2021) 1 each 0   ferrous sulfate 325 (65 FE) MG tablet Take 1 tablet (325 mg total) by mouth every other day. 90 tablet 3   glucose blood (TRUE METRIX BLOOD GLUCOSE TEST) test strip Use to check blood sugar up to 3 times daily (Patient not taking: Reported on  05/15/2021) 270 each 3   tadalafil (CIALIS) 5 MG tablet Take 5 mg by mouth daily as needed for erectile dysfunction.     terbinafine (LAMISIL) 250 MG tablet Take 1 tablet (250 mg total) by mouth daily. 90 tablet 0   TRUEplus Lancets 30G MISC Use to check blood sugar up to 3 times daily (Patient not taking: Reported on 05/15/2021) 270 each 3   No current facility-administered medications for this visit.    REVIEW OF SYSTEMS:   Constitutional: ( - ) fevers, ( - )  chills , ( - ) night sweats Eyes: ( - ) blurriness of vision, ( - ) double vision, ( - ) watery eyes Ears, nose, mouth, throat, and face: ( - ) mucositis, ( - ) sore throat Respiratory: ( - ) cough, ( - ) dyspnea, ( - ) wheezes Cardiovascular: ( - ) palpitation, ( - ) chest discomfort, ( - ) lower extremity swelling Gastrointestinal:  ( - ) nausea, ( - ) heartburn, ( - ) change in bowel habits Skin: ( - ) abnormal skin rashes Lymphatics: ( - ) new lymphadenopathy, ( - ) easy bruising Neurological: ( - ) numbness, ( - ) tingling, ( - ) new weaknesses Behavioral/Psych: ( - ) mood change, ( - ) new changes  All other systems were reviewed with the patient and are negative.  PHYSICAL EXAMINATION:  Vitals:   08/07/21 1316  BP: 139/63  Pulse: 70  Resp: 17  Temp: 98.1 F (36.7 C)  SpO2: 100%   Filed Weights    GENERAL: well appearing middle-aged African-American male in a wheelchair, in NAD  SKIN: skin color, texture, turgor are normal, no rashes or significant lesions EYES: conjunctiva are pink and non-injected, sclera clear LUNGS: clear to auscultation and percussion with normal breathing effort HEART: regular rate & rhythm and no murmurs and no lower extremity edema Musculoskeletal: no cyanosis of digits and no clubbing  PSYCH: alert & oriented x 3, fluent speech NEURO: Hemiparesis of lower extremities.  LABORATORY DATA:  I have reviewed the data as listed    Latest Ref Rng & Units 08/07/2021    2:17 PM 11/13/2020     8:09 PM 10/25/2020    2:44 PM  CBC  WBC 4.0 - 10.5 K/uL 3.8   3.8   3.4    Hemoglobin 13.0 - 17.0 g/dL 14.5   13.5  14.7    Hematocrit 39.0 - 52.0 % 43.5   41.7   44.9    Platelets 150 - 400 K/uL 181   213   140         Latest Ref Rng & Units 08/07/2021    2:17 PM 11/13/2020    8:09 PM 10/25/2020    2:44 PM  CMP  Glucose 70 - 99 mg/dL 99   102   133    BUN 6 - 20 mg/dL '27   16   16    '$ Creatinine 0.61 - 1.24 mg/dL 1.25   1.02   1.00    Sodium 135 - 145 mmol/L 141   138   141    Potassium 3.5 - 5.1 mmol/L 4.2   4.3   4.1    Chloride 98 - 111 mmol/L 110   107   107    CO2 22 - 32 mmol/L '25   26   26    '$ Calcium 8.9 - 10.3 mg/dL 9.2   8.7   9.0    Total Protein 6.5 - 8.1 g/dL 7.1      Total Bilirubin 0.3 - 1.2 mg/dL 0.9      Alkaline Phos 38 - 126 U/L 48      AST 15 - 41 U/L 27      ALT 0 - 44 U/L 20         ASSESSMENT & PLAN Andrew Sullivan 51 y.o. male with medical history significant for cervical spine fracture in 1988 with paraparesis in both lower extremities who presents for evaluation of recurrent right lower extremity DVT.   After review of the labs, review of the records, and discussion with the patient the patients findings are most consistent with recurrent unprovoked right lower extremity DVTs.   A provoked venous thromboembolism (VTE) is one that has a clear inciting factor or event. Provoking factors include prolonged travel/immobility, surgery (particular abdominal or orthropedic), trauma,  and pregnancy/ estrogen containing birth control. After a detailed history and review of the records there is no clear provoking factor for this patient's VTE.  Patients with unprovoked VTEs have up to 25% recurrence after 5 years and 36% at 10 years, with 4% of these clots being fatal (BMJ?2019;366:l4363). Therefore the formal recommendation for unprovoked VTE's is lifelong anticoagulation, as the cause may not be transient or reversible. We recommend 6 months or full strength  anticoagulation with a re-evaluation after that time.  The patient's will then have a choice of maintenance dose DOAC (preferred, recommended), '81mg'$  ASA PO daily (non-preferred), or no further anticoagulation (not recommended).    Patient is hesitant to restart anticoagulation therapy and would like to be off of treatment.  We discussed the risks and benefits of anticoagulation treatment moving forward.  I noted that he is at high probability of developing recurrent VTE if he does not start anticoagulation therapy.  # Unprovoked Recurrent Right Lower Extremity DVTs --findings at this time are consistent with recurrent unprovoked VTE  --will order baseline CMP and CBC to assure labs are adequate for DOAC therapy  --rule out APS with anticardiolipin and anti beta2 glycoprotein antibodies.  Lupus anticoagulant panel would be altered by presence of blood thinner, will hold on this testing.   --recommend the patient start maintenance dose xarelto '10mg'$ .  Patient is hesitant to restart anticoagulation therapy and would like to be off of treatment.  He notes he will consider this moving forward. --patient denies any bleeding, bruising,  or dark stools on this medication. It is well tolerated. No difficulties accessing/affording the medication  --RTC in 6 months' time with strict return precautions for overt signs of bleeding.    Orders Placed This Encounter  Procedures   CBC with Differential ([facility] Only)    Standing Status:   Future    Number of Occurrences:   1    Standing Expiration Date:   08/08/2022   CMP ([facility] only)    Standing Status:   Future    Number of Occurrences:   1    Standing Expiration Date:   08/08/2022   Beta-2-glycoprotein i abs, IgG/M/A    Standing Status:   Future    Number of Occurrences:   1    Standing Expiration Date:   08/07/2022   Cardiolipin antibodies, IgG, IgM, IgA*    Standing Status:   Future    Number of Occurrences:   1    Standing Expiration Date:    08/07/2022   Lupus anticoagulant panel*    Standing Status:   Future    Number of Occurrences:   1    Standing Expiration Date:   08/07/2022    All questions were answered. The patient knows to call the clinic with any problems, questions or concerns.  A total of more than 60 minutes were spent on this encounter with face-to-face time and non-face-to-face time, including preparing to see the patient, ordering tests and/or medications, counseling the patient and coordination of care as outlined above.   Ledell Peoples, MD Department of Hematology/Oncology [place] [facility] Phone: [phone] Pager: [phone] Email: [email or web address]  08/07/2021 4:25 PM

## 2021-08-08 LAB — LUPUS ANTICOAGULANT PANEL
DRVVT: 38 s (ref 0.0–47.0)
PTT Lupus Anticoagulant: 41.1 s (ref 0.0–43.5)

## 2021-08-08 LAB — CARDIOLIPIN ANTIBODIES, IGG, IGM, IGA
Anticardiolipin IgA: <9 APL U/mL (ref 0–11)
Anticardiolipin IgG: <9 GPL U/mL (ref 0–14)
Anticardiolipin IgM: <9 MPL U/mL (ref 0–12)

## 2021-08-08 LAB — BETA-2-GLYCOPROTEIN I ABS, IGG/M/A
Beta-2 Glyco I IgG: <9 GPI IgG units (ref 0–20)
Beta-2-Glycoprotein I IgA: <9 GPI IgA units (ref 0–25)
Beta-2-Glycoprotein I IgM: <9 GPI IgM units (ref 0–32)

## 2021-08-13 ENCOUNTER — Encounter: Payer: Self-pay | Admitting: *Deleted

## 2021-09-11 ENCOUNTER — Encounter: Payer: Self-pay | Admitting: Family Medicine

## 2021-09-11 ENCOUNTER — Ambulatory Visit (INDEPENDENT_AMBULATORY_CARE_PROVIDER_SITE_OTHER): Payer: Medicare HMO | Admitting: Family Medicine

## 2021-09-11 DIAGNOSIS — G8250 Quadriplegia, unspecified: Secondary | ICD-10-CM

## 2021-09-11 NOTE — Progress Notes (Signed)
MD completed a PCS form while in clinic.  Copy made for batch scanning and original given to patient, to take back to case worker. Christen Bame, CMA

## 2021-09-11 NOTE — Patient Instructions (Signed)
It was great to see you!  I have completed the paperwork for personal care services.  I will fax the visit notes to the wheelchair company this afternoon and hopefully your appeal will be approved.  Let me know if you have any additional concerns  -Dr Rock Nephew

## 2021-09-11 NOTE — Progress Notes (Signed)
    SUBJECTIVE:   CHIEF COMPLAINT / HPI:   Spastic Paraplegia Patient is wheelchair bound at baseline s/p ladder accident in 1988 with cervical fracture. He has had worsening of various MSK issues recently related to non-functioning wheelchair. Unfortunately insurance denied his request for new wheelchair supplies. There is an appeal in process.   PERTINENT  PMH / PSH: Spastic tetraplegia, h/o DVT and PE  OBJECTIVE:   BP 130/80   Pulse 86   SpO2 98%   Gen: alert, NAD Resp: normal work of breathing Neuro: spasticity of bilateral lower extremities noted, 1/5 strength bilateral lower extremities, sensation intact to light touch throughout Psych: appropriate mood and affect  ASSESSMENT/PLAN:   Spastic tetraplegia (HCC) Patient is 100% dependent on his wheelchair for any type of mobility. Patient requires wheelchair with elevated leg rests to offload pressure from his sacral region and prevent chronic pressure wounds. Also needs to elevate legs due to history of DVT with PE (high risk of recurrence) and chronic lower extremity edema.   Will fax office visit notes to Caren Griffins at [facility] [phone]), hopefully appeal will be approved.  [place] form completed today so patient can have assistance with ADLs/iADLs due to his paraplegia.  Alcus Dad, MD [place]

## 2021-09-11 NOTE — Assessment & Plan Note (Signed)
Patient is 100% dependent on his wheelchair for any type of mobility. Patient requires wheelchair with elevated leg rests to offload pressure from his sacral region and prevent chronic pressure wounds. Also needs to elevate legs due to history of DVT with PE (high risk of recurrence) and chronic lower extremity edema.

## 2021-09-19 ENCOUNTER — Telehealth: Payer: Self-pay

## 2021-09-19 NOTE — Telephone Encounter (Signed)
Andrew Sullivan with [employer] calls nurse line requesting most recent office notes.   Andrew Sullivan reports they never received fax notes from earlier this week.   Office notes refaxed.

## 2021-12-03 ENCOUNTER — Emergency Department (HOSPITAL_COMMUNITY)
Admission: EM | Admit: 2021-12-03 | Discharge: 2021-12-03 | Discharge status: Against Medical Advice | Payer: Medicare HMO | Attending: Student | Admitting: Student

## 2021-12-03 ENCOUNTER — Emergency Department (HOSPITAL_BASED_OUTPATIENT_CLINIC_OR_DEPARTMENT_OTHER): Payer: Medicare HMO

## 2021-12-03 ENCOUNTER — Other Ambulatory Visit: Payer: Self-pay

## 2021-12-03 ENCOUNTER — Emergency Department (HOSPITAL_COMMUNITY): Payer: Medicare HMO

## 2021-12-03 ENCOUNTER — Encounter (HOSPITAL_COMMUNITY): Payer: Self-pay | Admitting: Emergency Medicine

## 2021-12-03 DIAGNOSIS — M7989 Other specified soft tissue disorders: Secondary | ICD-10-CM

## 2021-12-03 DIAGNOSIS — M25512 Pain in left shoulder: Secondary | ICD-10-CM | POA: Diagnosis not present

## 2021-12-03 DIAGNOSIS — R2241 Localized swelling, mass and lump, right lower limb: Secondary | ICD-10-CM | POA: Diagnosis not present

## 2021-12-03 DIAGNOSIS — Z5321 Procedure and treatment not carried out due to patient leaving prior to being seen by health care provider: Secondary | ICD-10-CM | POA: Diagnosis not present

## 2021-12-03 DIAGNOSIS — M25511 Pain in right shoulder: Secondary | ICD-10-CM | POA: Diagnosis not present

## 2021-12-03 NOTE — ED Notes (Signed)
Pt left AMA °

## 2021-12-03 NOTE — ED Triage Notes (Addendum)
Pt states his shoulders have been hurting him off and on since his electric wheelchair broke in December. He now is wheeling himself around manually. Denies CP/SOB. Pt adds that his right leg has started to swell and his traditionally has had blood clots in this leg. Pt is not currently on blood thinner.

## 2021-12-03 NOTE — ED Provider Triage Note (Signed)
Emergency Medicine Provider Triage Evaluation Note  Andrew Sullivan , a 51 y.o. male  was evaluated in triage.  Pt complains of bilateral shoulder pain and right leg swelling.  Patient reports that he has been having bilateral shoulder pain since his electric wheelchair broke last year.  He has been using his manual wheelchair that is exacerbating his pain.  Patient also reports swelling to his right lower extremity.  Patient reports he has a history of this but this has not gone away with elevation.  Patient reports history of DVT in the past.  Not currently on blood thinners.  Patient denies chest pain or shortness of breath.  He has taken no medications for pain prior to arrival..  Review of Systems  Positive: Bilateral shoulder pain and right leg swelling Negative: Chest pain or shortness of breath  Physical Exam  BP (!) 124/56 (BP Location: Right Arm)   Pulse (!) 56   Temp 98.1 F (36.7 C) (Oral)   Resp 18   SpO2 98%  Gen:   Awake, no distress   Resp:  Normal effort  MSK:   Moves extremities without difficulty  Other:  Patient has some mild edema of the right lower extremity.  No palpable cord.  No erythema or warmth.  Neurovascularly intact.  Patient has full range of motion of bilateral shoulders.  No obvious deformity.  Radial pulses are 2+.  Medical Decision Making  Medically screening exam initiated at 2:24 PM.  Appropriate orders placed.  Andrew Sullivan was informed that the remainder of the evaluation will be completed by another provider, this initial triage assessment does not replace that evaluation, and the importance of remaining in the ED until their evaluation is complete.  Patient with bilateral atraumatic shoulder pain.  Patient is neurovascularly intact.  Patient reports swelling to the right leg history of DVT.  Denies chest pain or shortness of breath.  X-ray imaging is pending at this time along with ultrasound imaging the right lower extremity.  Patient  declined pain medication.   Doristine Devoid, PA-C 12/03/21 1434

## 2021-12-03 NOTE — Progress Notes (Signed)
RLE venous duplex has been completed.  Preliminary findings given to Ocie Cornfield, PA-C.    Results can be found under chart review under CV PROC. 12/03/2021 4:28 PM Colin Ellers RVT, RDMS

## 2021-12-04 ENCOUNTER — Encounter: Payer: Self-pay | Admitting: Family Medicine

## 2021-12-04 ENCOUNTER — Ambulatory Visit (INDEPENDENT_AMBULATORY_CARE_PROVIDER_SITE_OTHER): Payer: Medicare HMO | Admitting: Family Medicine

## 2021-12-04 VITALS — BP 120/66 | HR 64

## 2021-12-04 DIAGNOSIS — G8250 Quadriplegia, unspecified: Secondary | ICD-10-CM

## 2021-12-04 DIAGNOSIS — I82531 Chronic embolism and thrombosis of right popliteal vein: Secondary | ICD-10-CM

## 2021-12-04 MED ORDER — BACLOFEN 20 MG PO TABS: 20.0000 mg | ORAL_TABLET | Freq: Four times a day (QID) | ORAL | 5 refills | Status: DC

## 2021-12-04 NOTE — Progress Notes (Unsigned)
    SUBJECTIVE:   CHIEF COMPLAINT / HPI:   Renew [employer] forms Patient needs forms completed for renewal of his transportation services.  Right Leg Swelling Patient states his legs swell at the end of the day.  This is a chronic problem that resolves in the morning.  However earlier this week the swelling in his right leg did not resolve when he woke up.  He was seen in the ED where ultrasound showed chronic right popliteal thrombosis.  He has no pain or redness. Denies chest pain or SOB. Has not been taking his Xarelto for several months.  Did not feel he needed it and prefers to minimize medications, especially blood thinners. Previously saw hematology to discuss his history of DVT and they recommended Xarelto '10mg'$  daily indefinitely.  PERTINENT  PMH / PSH: Spastic tetraplegia  OBJECTIVE:   BP 120/66   Pulse 64   SpO2 100%   Gen: NAD, pleasant, wheelchair bound CV: RRR, normal S1/S2, no murmur Resp: Normal effort, lungs CTAB Extremities: trace nonpitting edema bilateral ankles, no calf tenderness, swelling, redness, or increased warmth Skin: warm and dry, no rashes noted Neuro: alert, symmetric strength in bilateral upper extremities Psych: Normal affect and mood   ASSESSMENT/PLAN:   DVT (deep venous thrombosis) (HCC) Ultrasound yesterday showed chronic right popliteal thrombosis, no acute DVT. Has not been taking Xarelto for several months despite recommendations from hematology. -Discussed importance of Xarelto. Patient now agreeable given evidence of chronic DVT. He will resume Xarelto '10mg'$  daily  Spastic Tetraplegia -[employer] forms completed and faxed as requested -refill sent on baclofen   Alcus Dad, MD [place]

## 2021-12-04 NOTE — Patient Instructions (Signed)
It was great to see you!  It is very important to take your Xarelto daily.  This is a blood thinner that will help prevent the clot in your leg from traveling to your lungs.  I have completed the [employer] forms for you.  Our office will fax a copy as requested.  I sent baclofen refills to your pharmacy.  Take care, Dr. Rock Nephew

## 2021-12-05 NOTE — Assessment & Plan Note (Signed)
Ultrasound yesterday showed chronic right popliteal thrombosis, no acute DVT. Has not been taking Xarelto for several months despite recommendations from hematology. -Discussed importance of Xarelto. Patient now agreeable given evidence of chronic DVT. He will resume Xarelto '10mg'$  daily

## 2021-12-06 IMAGING — CR DG CHEST 2V
3 series · 3 of 3 positions shown · non-contrast
Comparison: 02/17/2020

CLINICAL DATA: Chest pain

EXAM:
CHEST - 2 VIEW

[chest lat]
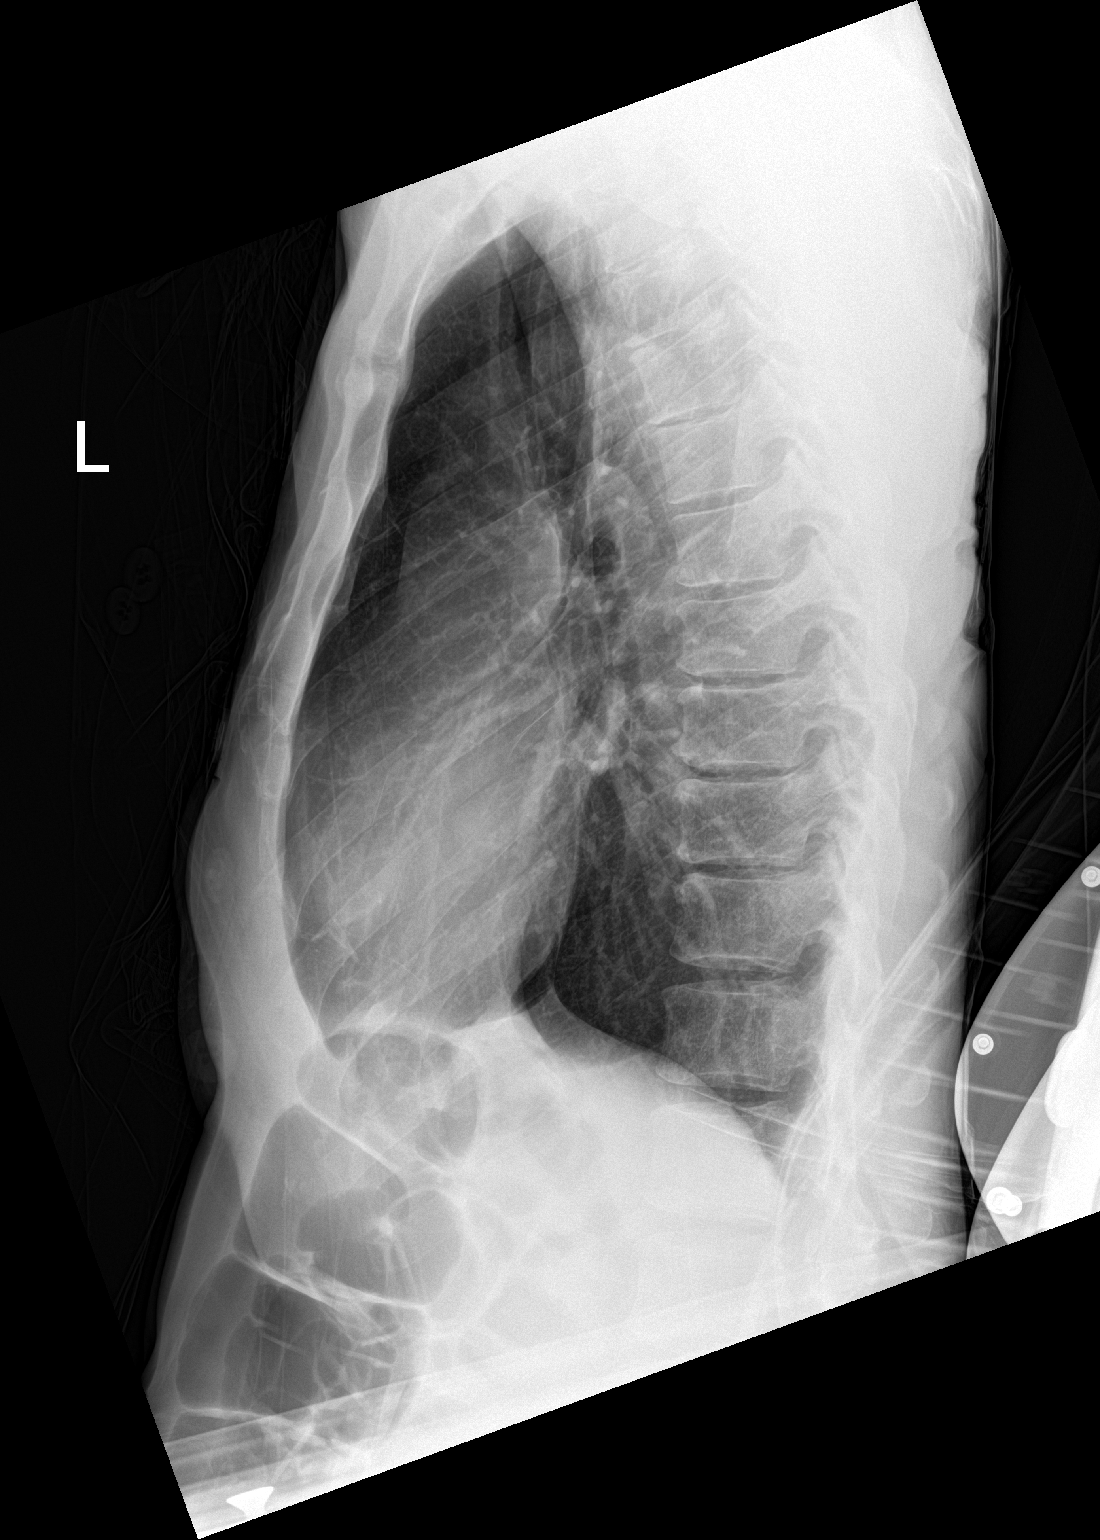

[chest ap (1 of 2)]
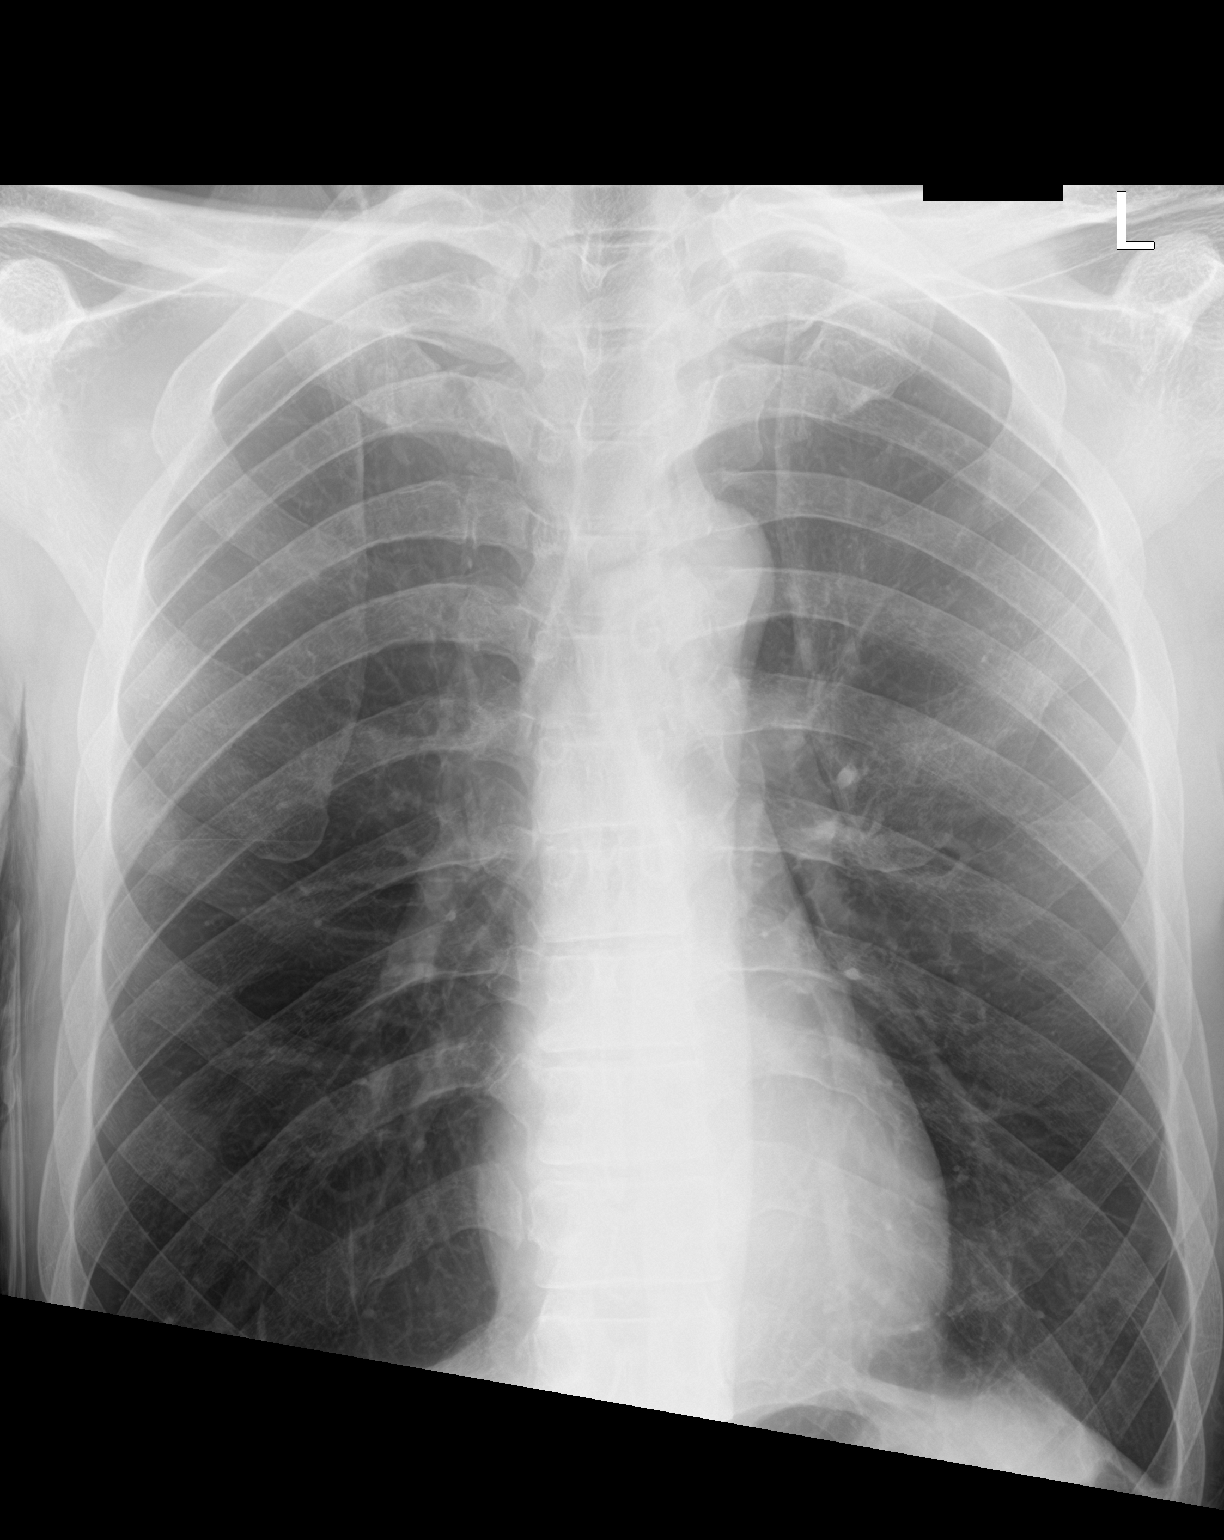

[chest ap (2 of 2)]
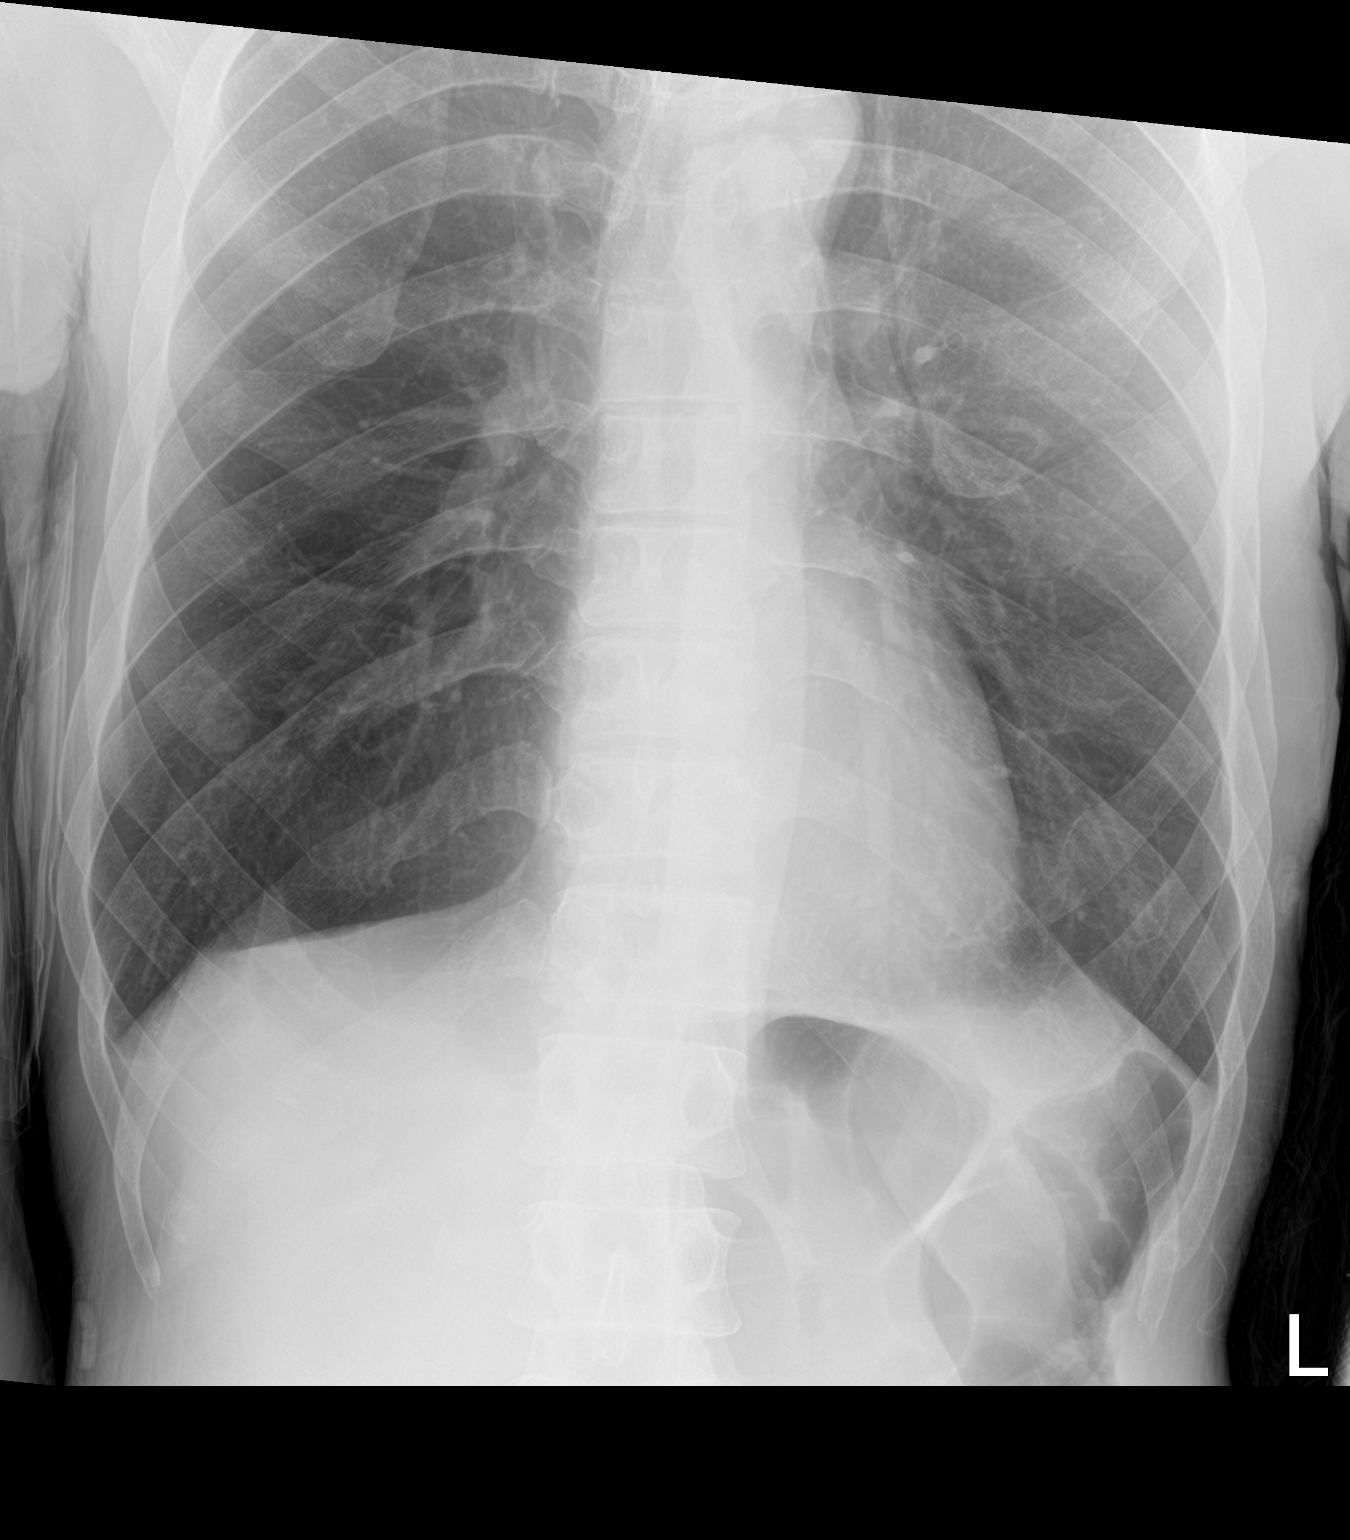

[3 of 3 positions shown; findings below may reference images not displayed]

FINDINGS: The heart size and mediastinal contours are within normal limits.
Pulmonary hyperinflation, as can be seen with COPD. Clear lungs. No
pleural effusion. The visualized skeletal structures are
unremarkable.
IMPRESSION: No active cardiopulmonary disease.

## 2022-02-06 ENCOUNTER — Inpatient Hospital Stay (HOSPITAL_BASED_OUTPATIENT_CLINIC_OR_DEPARTMENT_OTHER): Payer: Medicare HMO | Admitting: Hematology and Oncology

## 2022-02-06 ENCOUNTER — Other Ambulatory Visit: Payer: Self-pay | Admitting: Hematology and Oncology

## 2022-02-06 ENCOUNTER — Other Ambulatory Visit: Payer: Self-pay

## 2022-02-06 ENCOUNTER — Inpatient Hospital Stay: Payer: Medicare HMO | Attending: Hematology and Oncology

## 2022-02-06 VITALS — BP 149/79 | HR 63 | Temp 98.6°F | Resp 16 | Wt 167.5 lb

## 2022-02-06 DIAGNOSIS — Z87891 Personal history of nicotine dependence: Secondary | ICD-10-CM | POA: Diagnosis not present

## 2022-02-06 DIAGNOSIS — Z87440 Personal history of urinary (tract) infections: Secondary | ICD-10-CM | POA: Insufficient documentation

## 2022-02-06 DIAGNOSIS — Z86718 Personal history of other venous thrombosis and embolism: Secondary | ICD-10-CM | POA: Diagnosis not present

## 2022-02-06 DIAGNOSIS — I82461 Acute embolism and thrombosis of right calf muscular vein: Secondary | ICD-10-CM

## 2022-02-06 DIAGNOSIS — D500 Iron deficiency anemia secondary to blood loss (chronic): Secondary | ICD-10-CM

## 2022-02-06 DIAGNOSIS — Z7901 Long term (current) use of anticoagulants: Secondary | ICD-10-CM | POA: Insufficient documentation

## 2022-02-06 DIAGNOSIS — I82501 Chronic embolism and thrombosis of unspecified deep veins of right lower extremity: Secondary | ICD-10-CM | POA: Diagnosis not present

## 2022-02-06 LAB — CBC WITH DIFFERENTIAL (CANCER CENTER ONLY)
Abs Immature Granulocytes: 0.01 10*3/uL (ref 0.00–0.07)
Basophils Absolute: 0 10*3/uL (ref 0.0–0.1)
Basophils Relative: 1 %
Eosinophils Absolute: 0.1 10*3/uL (ref 0.0–0.5)
Eosinophils Relative: 3 %
HCT: 41.4 % (ref 39.0–52.0)
Hemoglobin: 14.1 g/dL (ref 13.0–17.0)
Immature Granulocytes: 0 %
Lymphocytes Relative: 22 %
Lymphs Abs: 0.9 10*3/uL (ref 0.7–4.0)
MCH: 32 pg (ref 26.0–34.0)
MCHC: 34.1 g/dL (ref 30.0–36.0)
MCV: 94.1 fL (ref 80.0–100.0)
Monocytes Absolute: 0.4 10*3/uL (ref 0.1–1.0)
Monocytes Relative: 10 %
Neutro Abs: 2.6 10*3/uL (ref 1.7–7.7)
Neutrophils Relative %: 64 %
Platelet Count: 179 10*3/uL (ref 150–400)
RBC: 4.4 MIL/uL (ref 4.22–5.81)
RDW: 11.7 % (ref 11.5–15.5)
WBC Count: 4 10*3/uL (ref 4.0–10.5)
nRBC: 0 % (ref 0.0–0.2)

## 2022-02-06 LAB — CMP (CANCER CENTER ONLY)
ALT: 17 U/L (ref 0–44)
AST: 23 U/L (ref 15–41)
Albumin: 4.2 g/dL (ref 3.5–5.0)
Alkaline Phosphatase: 50 U/L (ref 38–126)
Anion gap: 4 — ABNORMAL LOW (ref 5–15)
BUN: 20 mg/dL (ref 6–20)
CO2: 31 mmol/L (ref 22–32)
Calcium: 9.4 mg/dL (ref 8.9–10.3)
Chloride: 107 mmol/L (ref 98–111)
Creatinine: 1.03 mg/dL (ref 0.61–1.24)
GFR, Estimated: >60 mL/min (ref >60)
Glucose, Bld: 94 mg/dL (ref 70–99)
Potassium: 4.3 mmol/L (ref 3.5–5.1)
Sodium: 142 mmol/L (ref 135–145)
Total Bilirubin: 0.5 mg/dL (ref 0.3–1.2)
Total Protein: 7 g/dL (ref 6.5–8.1)

## 2022-02-06 NOTE — Progress Notes (Signed)
[facility] [phone]   [phone]  PROGRESS NOTE  Patient Care Team: Alcus Dad, MD as PCP - General (Family Medicine) Alexis Frock, MD as Consulting Physician (Urology) Clent Jacks, MD as Referring Physician (Ophthalmology) Hilarie Fredrickson Lajuan Lines, MD as Consulting Physician (Gastroenterology)  Hematological/Oncological History # Unprovoked Right Lower Extremity DVT  10/14/2018: Lower Extremity US showed acute deep vein thrombosis involving the right femoral vein, right popliteal vein, right posterior tibial veins, and right peroneal veins. 10/22/2020: Lower Extremity US showed an acute deep vein thrombosis involving the right posterior tibial veins, and right gastrocnemius veins 08/07/2021: establish care with Dr. Lorenso Courier   Interval History:  Andrew Sullivan 51 y.o. male with medical history significant for lower extremity DVT who presents for a follow up visit. The patient's last visit was on 08/07/2021 at which time he established care. In the interim since the last visit he has continued on Xarelto maintenance therapy.   On exam today Andrew Sullivan reports he is taking his Xarelto 10 mg p.o. daily without any difficulty.  He reports that he is not having any trouble with bleeding, bruising, or dark stools.  He reports the medication is not too expensive and his insurance covers it.  He notes that he is not having any signs or symptoms concerning for recurrent blood clot.  He is not having any chest pain, shortness of breath, leg swelling, or leg pain.  He does have some nausea on occasion but does not have any vomiting or diarrhea.  He reports that typically when he eats the nausea symptom goes away.  He has been eating quite well.  He has had no other major changes in his health in the interim since her last visit.  He denies any fevers, chills, sweats, vomiting, or diarrhea.  A full 10 point ROS was otherwise negative.  MEDICAL HISTORY:  Past Medical  History:  Diagnosis Date   Anemia    Erectile dysfunction    Gross hematuria    History of recurrent UTIs    History of spinal cord injury    37 (age 39 fell from ladder) w/ cervical spine fx  s/p  c4 -- c6  fusion--  residual paraplegic bilateral legs (great below T8 uses w/c and can transfer self   Paraparesis of both lower limbs ([place])    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 40 in 33   Spastic tetraplegia ([place])     SURGICAL HISTORY: Past Surgical History:  Procedure Laterality Date   BIOPSY  06/26/2020   Procedure: BIOPSY;  Surgeon: Jerene Bears, MD;  Location: [facility];  Service: Gastroenterology;;  EGD and COLON   CERVICAL FUSION  1988   C4 -- C6 w/ spinal cord injury   COLONOSCOPY WITH PROPOFOL N/A 06/26/2020   Procedure: COLONOSCOPY WITH PROPOFOL;  Surgeon: Jerene Bears, MD;  Location: [facility];  Service: Gastroenterology;  Laterality: N/A;   CYSTOSCOPY WITH RETROGRADE PYELOGRAM, URETEROSCOPY AND STENT PLACEMENT Left 12/27/2013   Procedure: CYSTOSCOPY WITH RETROGRADE PYELOGRAM, URETEROSCOPY AND STENT PLACEMENT;  Surgeon: Reece Packer, MD;  Location: [place];  Service: Urology;  Laterality: Left;   CYSTOSCOPY/RETROGRADE/URETEROSCOPY Left 01/09/2014   Procedure: CYSTOSCOPY/RETROGRADE/URETEROSCOPY, URETERAL BIOPSY AND STENT EXCHANGE;  Surgeon: Alexis Frock, MD;  Location: [facility];  Service: Urology;  Laterality: Left;   ESOPHAGOGASTRODUODENOSCOPY (EGD) WITH PROPOFOL N/A 06/26/2020   Procedure: ESOPHAGOGASTRODUODENOSCOPY (EGD) WITH PROPOFOL;  Surgeon: Jerene Bears, MD;  Location: [facility];  Service: Gastroenterology;  Laterality:  N/A;   HIP ARTHROPLASTY  08/12/2011   Procedure: ARTHROPLASTY BIPOLAR HIP;  Surgeon: Mauri Pole, MD;  Location: [facility];  Service: Orthopedics;  Laterality: Left;  Hemi-arthroplasty   POLYPECTOMY  06/26/2020   Procedure: POLYPECTOMY;  Surgeon: Jerene Bears, MD;  Location: [facility];  Service:  Gastroenterology;;    SOCIAL HISTORY: Social History   Socioeconomic History   Marital status: Single    Spouse name: Not on file   Number of children: 0   Years of education: 12   Highest education level: 12th grade  Occupational History   Occupation: Disability  Tobacco Use   Smoking status: Former    Packs/day: 0.25    Years: 0.50    Total pack years: 0.13    Types: Cigarettes    Quit date: 12/27/1986    Years since quitting: 35.1    Passive exposure: Past   Smokeless tobacco: Never  Vaping Use   Vaping Use: Never used  Substance and Sexual Activity   Alcohol use: No    Comment: hx of use 1980s   Drug use: No   Sexual activity: Not Currently  Other Topics Concern   Not on file  Social History Narrative   Patient lives with his mother and step father.   Is on disability secondary to neck fracture.    Stays active going to gym and church. Uses [employer] for transportation or local bus.   Enjoys going to the movies and playing cards.    Social Determinants of Health   Financial Resource Strain: Low Risk  (05/15/2021)   Overall Financial Resource Strain (CARDIA)    Difficulty of Paying Living Expenses: Not hard at all  Food Insecurity: No Food Insecurity (05/15/2021)   Hunger Vital Sign    Worried About Running Out of Food in the Last Year: Never true    Ran Out of Food in the Last Year: Never true  Transportation Needs: No Transportation Needs (05/15/2021)   PRAPARE - Hydrologist (Medical): No    Lack of Transportation (Non-Medical): No  Physical Activity: Insufficiently Active (05/15/2021)   Exercise Vital Sign    Days of Exercise per Week: 2 days    Minutes of Exercise per Session: 60 min  Stress: No Stress Concern Present (05/15/2021)   [place]    Feeling of Stress : Not at all  Social Connections: Moderately Integrated (05/15/2021)   Social Connection and Isolation Panel  [NHANES]    Frequency of Communication with Friends and Family: Twice a week    Frequency of Social Gatherings with Friends and Family: Twice a week    Attends Religious Services: More than 4 times per year    Active Member of [employer] or Organizations: Yes    Attends Music therapist: More than 4 times per year    Marital Status: Never married  Intimate Partner Violence: Not At Risk (05/15/2021)   Humiliation, Afraid, Rape, and Kick questionnaire    Fear of Current or Ex-Partner: No    Emotionally Abused: No    Physically Abused: No    Sexually Abused: No    FAMILY HISTORY: Family History  Problem Relation Age of Onset   Arthritis Mother    Hypertension Mother    Colon cancer Neg Hx    Esophageal cancer Neg Hx    Pancreatic cancer Neg Hx    Stomach cancer Neg Hx    Liver disease  Neg Hx     ALLERGIES:  is allergic to penicillins.  MEDICATIONS:  Current Outpatient Medications  Medication Sig Dispense Refill   baclofen (LIORESAL) 20 MG tablet Take 1 tablet (20 mg total) by mouth 4 (four) times daily. 120 tablet 5   ferrous sulfate 325 (65 FE) MG tablet Take 1 tablet (325 mg total) by mouth every other day. 90 tablet 3   rivaroxaban (XARELTO) 10 MG TABS tablet Take 1 tablet (10 mg total) by mouth daily. (Patient not taking: Reported on 09/11/2021) 90 tablet 2   tadalafil (CIALIS) 5 MG tablet Take 5 mg by mouth daily as needed for erectile dysfunction.     No current facility-administered medications for this visit.    REVIEW OF SYSTEMS:   Constitutional: ( - ) fevers, ( - )  chills , ( - ) night sweats Eyes: ( - ) blurriness of vision, ( - ) double vision, ( - ) watery eyes Ears, nose, mouth, throat, and face: ( - ) mucositis, ( - ) sore throat Respiratory: ( - ) cough, ( - ) dyspnea, ( - ) wheezes Cardiovascular: ( - ) palpitation, ( - ) chest discomfort, ( - ) lower extremity swelling Gastrointestinal:  ( - ) nausea, ( - ) heartburn, ( - ) change in bowel  habits Skin: ( - ) abnormal skin rashes Lymphatics: ( - ) new lymphadenopathy, ( - ) easy bruising Neurological: ( - ) numbness, ( - ) tingling, ( - ) new weaknesses Behavioral/Psych: ( - ) mood change, ( - ) new changes  All other systems were reviewed with the patient and are negative.  PHYSICAL EXAMINATION:  Vitals:   02/06/22 1142  BP: (!) 149/79  Pulse: 63  Resp: 16  Temp: 98.6 F (37 C)  SpO2: 99%   Filed Weights   02/06/22 1142  Weight: 167 lb 8 oz (76 kg)    GENERAL: alert, no distress and comfortable SKIN: skin color, texture, turgor are normal, no rashes or significant lesions EYES: conjunctiva are pink and non-injected, sclera clear OROPHARYNX: no exudate, no erythema; lips, buccal mucosa, and tongue normal  NECK: supple, non-tender LYMPH:  no palpable lymphadenopathy in the cervical, axillary or inguinal LUNGS: clear to auscultation and percussion with normal breathing effort HEART: regular rate & rhythm and no murmurs and no lower extremity edema ABDOMEN: soft, non-tender, non-distended, normal bowel sounds Musculoskeletal: no cyanosis of digits and no clubbing  PSYCH: alert & oriented x 3, fluent speech NEURO: no focal motor/sensory deficits  LABORATORY DATA:  I have reviewed the data as listed    Latest Ref Rng & Units 02/06/2022   10:40 AM 08/07/2021    2:17 PM 11/13/2020    8:09 PM  CBC  WBC 4.0 - 10.5 K/uL 4.0  3.8  3.8   Hemoglobin 13.0 - 17.0 g/dL 14.1  14.5  13.5   Hematocrit 39.0 - 52.0 % 41.4  43.5  41.7   Platelets 150 - 400 K/uL 179  181  213        Latest Ref Rng & Units 02/06/2022   10:40 AM 08/07/2021    2:17 PM 11/13/2020    8:09 PM  CMP  Glucose 70 - 99 mg/dL 94  99  102   BUN 6 - 20 mg/dL '20  27  16   '$ Creatinine 0.61 - 1.24 mg/dL 1.03  1.25  1.02   Sodium 135 - 145 mmol/L 142  141  138   Potassium 3.5 -  5.1 mmol/L 4.3  4.2  4.3   Chloride 98 - 111 mmol/L 107  110  107   CO2 22 - 32 mmol/L '31  25  26   '$ Calcium 8.9 - 10.3 mg/dL 9.4   9.2  8.7   Total Protein 6.5 - 8.1 g/dL 7.0  7.1    Total Bilirubin 0.3 - 1.2 mg/dL 0.5  0.9    Alkaline Phos 38 - 126 U/L 50  48    AST 15 - 41 U/L 23  27    ALT 0 - 44 U/L 17  20      RADIOGRAPHIC STUDIES: No results found.  ASSESSMENT & PLAN Andrew Sullivan 51 y.o. male with medical history significant for lower extremity DVT who presents for a follow up visit.  After review of the labs, review of the records, and discussion with the patient the patients findings are most consistent with recurrent unprovoked right lower extremity DVTs.    A provoked venous thromboembolism (VTE) is one that has a clear inciting factor or event. Provoking factors include prolonged travel/immobility, surgery (particular abdominal or orthropedic), trauma,  and pregnancy/ estrogen containing birth control. After a detailed history and review of the records there is no clear provoking factor for this patient's VTE.  Patients with unprovoked VTEs have up to 25% recurrence after 5 years and 36% at 10 years, with 4% of these clots being fatal (BMJ?2019;366:l4363). Therefore the formal recommendation for unprovoked VTE's is lifelong anticoagulation, as the cause may not be transient or reversible. We recommend 6 months or full strength anticoagulation with a re-evaluation after that time.  The patient's will then have a choice of maintenance dose DOAC (preferred, recommended), '81mg'$  ASA PO daily (non-preferred), or no further anticoagulation (not recommended).     Patient is hesitant to restart anticoagulation therapy and would like to be off of treatment.  We discussed the risks and benefits of anticoagulation treatment moving forward.  I noted that he is at high probability of developing recurrent VTE if he does not start anticoagulation therapy.   # Unprovoked Recurrent Right Lower Extremity DVTs --findings at this time are consistent with recurrent unprovoked VTE  --will order baseline CMP and CBC to assure  labs are adequate for DOAC therapy   --continue maintenance dose xarelto '10mg'$  --patient denies any bleeding, bruising, or dark stools on this medication. It is well tolerated. No difficulties accessing/affording the medication  --Labs today show creatinine 1.03, normal LFTs, white blood cell count 4.0, hemoglobin 14.1, and platelets 179 --RTC in 6 months' time with strict return precautions for overt signs of bleeding.      No orders of the defined types were placed in this encounter.   All questions were answered. The patient knows to call the clinic with any problems, questions or concerns.  A total of more than 30 minutes were spent on this encounter with face-to-face time and non-face-to-face time, including preparing to see the patient, ordering tests and/or medications, counseling the patient and coordination of care as outlined above.   Ledell Peoples, MD Department of Hematology/Oncology [facility] Phone: [phone] Pager: [phone] Email: [email or web address]  02/11/2022 5:54 PM

## 2022-02-07 ENCOUNTER — Telehealth: Payer: Self-pay | Admitting: Physician Assistant

## 2022-02-07 NOTE — Telephone Encounter (Signed)
Per 11/30 los called and spoke to pt about appointment  

## 2022-03-27 DIAGNOSIS — M62830 Muscle spasm of back: Secondary | ICD-10-CM | POA: Diagnosis not present

## 2022-03-27 DIAGNOSIS — G8250 Quadriplegia, unspecified: Secondary | ICD-10-CM | POA: Diagnosis not present

## 2022-03-27 DIAGNOSIS — R532 Functional quadriplegia: Secondary | ICD-10-CM | POA: Diagnosis not present

## 2022-03-27 DIAGNOSIS — R609 Edema, unspecified: Secondary | ICD-10-CM | POA: Diagnosis not present

## 2022-05-19 DIAGNOSIS — M62830 Muscle spasm of back: Secondary | ICD-10-CM | POA: Diagnosis not present

## 2022-05-19 DIAGNOSIS — G8250 Quadriplegia, unspecified: Secondary | ICD-10-CM | POA: Diagnosis not present

## 2022-05-19 DIAGNOSIS — R609 Edema, unspecified: Secondary | ICD-10-CM | POA: Diagnosis not present

## 2022-05-27 ENCOUNTER — Telehealth: Payer: Self-pay | Admitting: Family Medicine

## 2022-05-27 NOTE — Telephone Encounter (Signed)
Contacted Andrew Sullivan to schedule their annual wellness visit. Appointment made for 06/02/2022.  Thank you,  [name] Direct dial  [phone]

## 2022-06-01 NOTE — Progress Notes (Unsigned)
I connected with  Andrew Sullivan on 06/02/2022 by a audio enabled telemedicine application and verified that I am speaking with the correct person using two identifiers.  Patient Location: Home  Provider Location: Home Office  I discussed the limitations of evaluation and management by telemedicine. The patient expressed understanding and agreed to proceed.   Subjective:   Andrew Sullivan is a 52 y.o. male who presents for Medicare Annual/Subsequent preventive examination.  Review of Systems    Per HPI unless specifically indicated below.  Cardiac Risk Factors include: advanced age (>49men, >67 women);male gender          Objective:       02/06/2022   11:42 AM 12/04/2021    9:43 AM 12/03/2021    7:15 PM  Vitals with BMI  Weight 167 lbs 8 oz    Systolic [id number]  Diastolic 79 66 67  Pulse 63 64 58    There were no vitals filed for this visit. There is no height or weight on file to calculate BMI.     12/04/2021    9:44 AM 12/03/2021    1:54 PM 09/11/2021   11:27 AM 07/15/2021    1:49 PM 05/15/2021    2:28 PM 04/17/2021    5:57 PM 01/04/2021   10:20 AM  Advanced Directives  Does Patient Have a Medical Advance Directive? No No No No No No No  Would patient like information on creating a medical advance directive? No - Patient declined No - Patient declined No - Patient declined No - Patient declined Yes (MAU/Ambulatory/Procedural Areas - Information given) No - Patient declined No - Patient declined    Current Medications (verified) Outpatient Encounter Medications as of 06/02/2022  Medication Sig   baclofen (LIORESAL) 20 MG tablet Take 1 tablet (20 mg total) by mouth 4 (four) times daily.   ferrous sulfate 325 (65 FE) MG tablet Take 1 tablet (325 mg total) by mouth every other day.   rivaroxaban (XARELTO) 10 MG TABS tablet Take 1 tablet (10 mg total) by mouth daily. (Patient not taking: Reported on 09/11/2021)   tadalafil (CIALIS) 5 MG tablet Take 5 mg by mouth  daily as needed for erectile dysfunction.   No facility-administered encounter medications on file as of 06/02/2022.    Allergies (verified) Penicillins   History: Past Medical History:  Diagnosis Date   Anemia    Erectile dysfunction    Gross hematuria    History of recurrent UTIs    History of spinal cord injury    74 (age 32 fell from ladder) w/ cervical spine fx  s/p  c4 -- c6  fusion--  residual paraplegic bilateral legs (great below T8 uses w/c and can transfer self   Paraparesis of both lower limbs (HCC)    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 42 in 79   Spastic tetraplegia [facility])    Past Surgical History:  Procedure Laterality Date   BIOPSY  06/26/2020   Procedure: BIOPSY;  Surgeon: Jerene Bears, MD;  Location: [facility];  Service: Gastroenterology;;  EGD and COLON   CERVICAL FUSION  1988   C4 -- C6 w/ spinal cord injury   COLONOSCOPY WITH PROPOFOL N/A 06/26/2020   Procedure: COLONOSCOPY WITH PROPOFOL;  Surgeon: Jerene Bears, MD;  Location: [facility];  Service: Gastroenterology;  Laterality: N/A;   CYSTOSCOPY WITH RETROGRADE PYELOGRAM, URETEROSCOPY AND STENT PLACEMENT Left 12/27/2013   Procedure: CYSTOSCOPY WITH RETROGRADE PYELOGRAM, URETEROSCOPY AND STENT PLACEMENT;  Surgeon: Reece Packer, MD;  Location: [facility];  Service: Urology;  Laterality: Left;   CYSTOSCOPY/RETROGRADE/URETEROSCOPY Left 01/09/2014   Procedure: CYSTOSCOPY/RETROGRADE/URETEROSCOPY, URETERAL BIOPSY AND STENT EXCHANGE;  Surgeon: Alexis Frock, MD;  Location: [facility];  Service: Urology;  Laterality: Left;   ESOPHAGOGASTRODUODENOSCOPY (EGD) WITH PROPOFOL N/A 06/26/2020   Procedure: ESOPHAGOGASTRODUODENOSCOPY (EGD) WITH PROPOFOL;  Surgeon: Jerene Bears, MD;  Location: [facility];  Service: Gastroenterology;  Laterality: N/A;   HIP ARTHROPLASTY  08/12/2011   Procedure: ARTHROPLASTY BIPOLAR HIP;  Surgeon: Mauri Pole, MD;  Location: [facility];  Service: Orthopedics;   Laterality: Left;  Hemi-arthroplasty   POLYPECTOMY  06/26/2020   Procedure: POLYPECTOMY;  Surgeon: Jerene Bears, MD;  Location: [facility];  Service: Gastroenterology;;   Family History  Problem Relation Age of Onset   Arthritis Mother    Hypertension Mother    Colon cancer Neg Hx    Esophageal cancer Neg Hx    Pancreatic cancer Neg Hx    Stomach cancer Neg Hx    Liver disease Neg Hx    Social History   Socioeconomic History   Marital status: Single    Spouse name: Not on file   Number of children: 0   Years of education: 23   Highest education level: 12th grade  Occupational History   Occupation: Disability  Tobacco Use   Smoking status: Former    Packs/day: 0.25    Years: 0.50    Additional pack years: 0.00    Total pack years: 0.13    Types: Cigarettes    Quit date: 12/27/1986    Years since quitting: 35.4    Passive exposure: Past   Smokeless tobacco: Never  Vaping Use   Vaping Use: Never used  Substance and Sexual Activity   Alcohol use: No    Comment: hx of use 1980s   Drug use: No   Sexual activity: Not Currently  Other Topics Concern   Not on file  Social History Narrative   Patient lives with his mother and step father.   Is on disability secondary to neck fracture.    Stays active going to gym and church. Uses [employer] for transportation or local bus.   Enjoys going to the movies and playing cards.    Social Determinants of Health   Financial Resource Strain: Low Risk  (05/15/2021)   Overall Financial Resource Strain (CARDIA)    Difficulty of Paying Living Expenses: Not hard at all  Food Insecurity: No Food Insecurity (05/15/2021)   Hunger Vital Sign    Worried About Running Out of Food in the Last Year: Never true    Ran Out of Food in the Last Year: Never true  Transportation Needs: No Transportation Needs (05/15/2021)   PRAPARE - Hydrologist (Medical): No    Lack of Transportation (Non-Medical): No  Physical Activity:  Insufficiently Active (05/15/2021)   Exercise Vital Sign    Days of Exercise per Week: 2 days    Minutes of Exercise per Session: 60 min  Stress: No Stress Concern Present (05/15/2021)   Summers    Feeling of Stress : Not at all  Social Connections: Moderately Integrated (05/15/2021)   Social Connection and Isolation Panel [NHANES]    Frequency of Communication with Friends and Family: Twice a week    Frequency of Social Gatherings with Friends and Family: Twice a week    Attends Religious Services:  More than 4 times per year    Active Member of Clubs or Organizations: Yes    Attends Archivist Meetings: More than 4 times per year    Marital Status: Never married    Tobacco Counseling Counseling given: Not Answered   Clinical Intake:                 Diabetic?No         Activities of Daily Living     No data to display          Patient Care Team: Alcus Dad, MD as PCP - General (Family Medicine) Alexis Frock, MD as Consulting Physician (Urology) Clent Jacks, MD as Referring Physician (Ophthalmology) Pyrtle, Lajuan Lines, MD as Consulting Physician (Gastroenterology)  Indicate any recent Medical Services you may have received from other than [facility] providers in the past year (date may be approximate).     Assessment:   This is a routine wellness examination for Andrew Sullivan.   Hearing/Vision screen Denies any hearing issues. Denies any change to her vision. Wear glasses. Annual Eye Exam. Dietary issues and exercise activities discussed:     Goals Addressed   None    Depression Screen    12/04/2021    9:44 AM 09/11/2021   11:27 AM 07/15/2021    1:49 PM 06/12/2021    3:23 PM 05/15/2021    2:26 PM 03/27/2021    2:43 PM 01/04/2021   10:20 AM  PHQ 2/9 Scores  PHQ - 2 Score 0 0 0 0 0 0 0  PHQ- 9 Score 2 1 1 2 1 1 2     Fall Risk    05/15/2021    2:28 PM 06/07/2020   11:07 AM  11/07/2019    3:51 PM 02/25/2018   11:14 AM 04/17/2017    1:53 PM  Fall Risk   Falls in the past year? 0 1 0 Exclusion - non ambulatory No  Number falls in past yr: 0 0 0    Injury with Fall? 0 1     Risk for fall due to : Impaired mobility      Follow up Falls prevention discussed  Falls evaluation completed      FALL RISK PREVENTION PERTAINING TO THE HOME:  Any stairs in or around the home? {YES/NO:21197} If so, are there any without handrails? {YES/NO:21197} Home free of loose throw rugs in walkways, pet beds, electrical cords, etc? {YES/NO:21197} Adequate lighting in your home to reduce risk of falls? {YES/NO:21197}  ASSISTIVE DEVICES UTILIZED TO PREVENT FALLS:  Life alert? {YES/NO:21197} Use of a cane, walker or w/c? {YES/NO:21197} Grab bars in the bathroom? {YES/NO:21197} Shower chair or bench in shower? {YES/NO:21197} Elevated toilet seat or a handicapped toilet? {YES/NO:21197}  TIMED UP AND GO:  Was the test performed? Unable to perform, virtual appointment   Cognitive Function:    02/25/2018   11:17 AM  MMSE - Mini Mental State Exam  Orientation to time 5  Orientation to Place 5  Registration 3  Attention/ Calculation 5  Recall 3  Language- name 2 objects 2  Language- repeat 1  Language- follow 3 step command 3  Language- read & follow direction 1  Write a sentence 1  Copy design 1  Total score 30        05/15/2021    2:29 PM 02/25/2018   11:17 AM  6CIT Screen  What Year? 0 points 0 points  What month? 0 points 0 points  What time? 0 points  0 points  Count back from 20 0 points 0 points  Months in reverse 0 points 0 points  Repeat phrase 0 points 0 points  Total Score 0 points 0 points    Immunizations Immunization History  Administered Date(s) Administered   Td 02/07/2001    TDAP status: Due, Education has been provided regarding the importance of this vaccine. Advised may receive this vaccine at local pharmacy or Health Dept. Aware to provide  a copy of the vaccination record if obtained from local pharmacy or Health Dept. Verbalized acceptance and understanding.  Flu Vaccine status: Due, Education has been provided regarding the importance of this vaccine. Advised may receive this vaccine at local pharmacy or Health Dept. Aware to provide a copy of the vaccination record if obtained from local pharmacy or Health Dept. Verbalized acceptance and understanding.  Pneumococcal vaccine status: Up to date  Covid-19 vaccine status: Information provided on how to obtain vaccines.   Qualifies for Shingles Vaccine? Yes   Zostavax completed No   Shingrix Completed?: No.    Education has been provided regarding the importance of this vaccine. Patient has been advised to call insurance company to determine out of pocket expense if they have not yet received this vaccine. Advised may also receive vaccine at local pharmacy or Health Dept. Verbalized acceptance and understanding.  Screening Tests Health Maintenance  Topic Date Due   COVID-19 Vaccine (1) Never done   DTaP/Tdap/Td (2 - Tdap) 02/08/2011   Zoster Vaccines- Shingrix (1 of 2) Never done   Medicare Annual Wellness (AWV)  05/16/2022   INFLUENZA VACCINE  06/08/2022 (Originally 10/08/2021)   COLONOSCOPY (Pts 45-52yrs Insurance coverage will need to be confirmed)  06/27/2030   Hepatitis C Screening  Completed   HIV Screening  Completed   HPV VACCINES  Aged Out    Health Maintenance  Health Maintenance Due  Topic Date Due   COVID-19 Vaccine (1) Never done   DTaP/Tdap/Td (2 - Tdap) 02/08/2011   Zoster Vaccines- Shingrix (1 of 2) Never done   Medicare Annual Wellness (AWV)  05/16/2022    Colorectal cancer screening: Type of screening: Colonoscopy. Completed 06/26/2020. Repeat every 10 years  Lung Cancer Screening: (Low Dose CT Chest recommended if Age 66-80 years, 30 pack-year currently smoking OR have quit w/in 15years.) does not qualify.   Lung Cancer Screening Referral: not  applicable   Additional Screening:  Hepatitis C Screening: does qualify; Completed 11/07/2019  Vision Screening: Recommended annual ophthalmology exams for early detection of glaucoma and other disorders of the eye. Is the patient up to date with their annual eye exam?  Yes  Who is the provider or what is the name of the office in which the patient attends annual eye exams? *** If pt is not established with a provider, would they like to be referred to a provider to establish care? No .   Dental Screening: Recommended annual dental exams for proper oral hygiene  Community Resource Referral / Chronic Care Management: CRR required this visit?  No   CCM required this visit?  No      Plan:     I have personally reviewed and noted the following in the patient's chart:   Medical and social history Use of alcohol, tobacco or illicit drugs  Current medications and supplements including opioid prescriptions. Patient is not currently taking opioid prescriptions. Functional ability and status Nutritional status Physical activity Advanced directives List of other physicians Hospitalizations, surgeries, and ER visits in previous 12 months  Vitals Screenings to include cognitive, depression, and falls Referrals and appointments  In addition, I have reviewed and discussed with patient certain preventive protocols, quality metrics, and best practice recommendations. A written personalized care plan for preventive services as well as general preventive health recommendations were provided to patient.     Andrew Sullivan , Thank you for taking time to come for your Medicare Wellness Visit. I appreciate your ongoing commitment to your health goals. Please review the following plan we discussed and let me know if I can assist you in the future.   These are the goals we discussed:  Goals      Exercise 3-4x per week     Patient reports going to the gym 2x per week. Would like to increase to  3-4x per week.      Prevent Falls        This is a list of the screening recommended for you and due dates:  Health Maintenance  Topic Date Due   COVID-19 Vaccine (1) Never done   DTaP/Tdap/Td vaccine (2 - Tdap) 02/08/2011   Zoster (Shingles) Vaccine (1 of 2) Never done   Flu Shot  06/08/2022*   Medicare Annual Wellness Visit  06/02/2023   Colon Cancer Screening  06/27/2030   Hepatitis C Screening: USPSTF Recommendation to screen - Ages 18-79 yo.  Completed   HIV Screening  Completed   HPV Vaccine  Aged Out  *Topic was postponed. The date shown is not the original due date.    Wilson Singer, [place]   06/02/2022  Nurse Notes: Approximately 30 minute Non-Face -To-Face Medicare Wellness Visit

## 2022-06-01 NOTE — Patient Instructions (Signed)
Health Maintenance, Male Adopting a healthy lifestyle and getting preventive care are important in promoting health and wellness. Ask your health care provider about: The right schedule for you to have regular tests and exams. Things you can do on your own to prevent diseases and keep yourself healthy. What should I know about diet, weight, and exercise? Eat a healthy diet  Eat a diet that includes plenty of vegetables, fruits, low-fat dairy products, and lean protein. Do not eat a lot of foods that are high in solid fats, added sugars, or sodium. Maintain a healthy weight Body mass index (BMI) is a measurement that can be used to identify possible weight problems. It estimates body fat based on height and weight. Your health care provider can help determine your BMI and help you achieve or maintain a healthy weight. Get regular exercise Get regular exercise. This is one of the most important things you can do for your health. Most adults should: Exercise for at least 150 minutes each week. The exercise should increase your heart rate and make you sweat (moderate-intensity exercise). Do strengthening exercises at least twice a week. This is in addition to the moderate-intensity exercise. Spend less time sitting. Even light physical activity can be beneficial. Watch cholesterol and blood lipids Have your blood tested for lipids and cholesterol at 52 years of age, then have this test every 5 years. You may need to have your cholesterol levels checked more often if: Your lipid or cholesterol levels are high. You are older than 52 years of age. You are at high risk for heart disease. What should I know about cancer screening? Many types of cancers can be detected early and may often be prevented. Depending on your health history and family history, you may need to have cancer screening at various ages. This may include screening for: Colorectal cancer. Prostate cancer. Skin cancer. Lung  cancer. What should I know about heart disease, diabetes, and high blood pressure? Blood pressure and heart disease High blood pressure causes heart disease and increases the risk of stroke. This is more likely to develop in people who have high blood pressure readings or are overweight. Talk with your health care provider about your target blood pressure readings. Have your blood pressure checked: Every 3-5 years if you are 18-39 years of age. Every year if you are 40 years old or older. If you are between the ages of 65 and 75 and are a current or former smoker, ask your health care provider if you should have a one-time screening for abdominal aortic aneurysm (AAA). Diabetes Have regular diabetes screenings. This checks your fasting blood sugar level. Have the screening done: Once every three years after age 45 if you are at a normal weight and have a low risk for diabetes. More often and at a younger age if you are overweight or have a high risk for diabetes. What should I know about preventing infection? Hepatitis B If you have a higher risk for hepatitis B, you should be screened for this virus. Talk with your health care provider to find out if you are at risk for hepatitis B infection. Hepatitis C Blood testing is recommended for: Everyone born from 1945 through 1965. Anyone with known risk factors for hepatitis C. Sexually transmitted infections (STIs) You should be screened each year for STIs, including gonorrhea and chlamydia, if: You are sexually active and are younger than 52 years of age. You are older than 52 years of age and your   health care provider tells you that you are at risk for this type of infection. Your sexual activity has changed since you were last screened, and you are at increased risk for chlamydia or gonorrhea. Ask your health care provider if you are at risk. Ask your health care provider about whether you are at high risk for HIV. Your health care provider  may recommend a prescription medicine to help prevent HIV infection. If you choose to take medicine to prevent HIV, you should first get tested for HIV. You should then be tested every 3 months for as long as you are taking the medicine. Follow these instructions at home: Alcohol use Do not drink alcohol if your health care provider tells you not to drink. If you drink alcohol: Limit how much you have to 0-2 drinks a day. Know how much alcohol is in your drink. In the [place] one drink equals one 12 oz bottle of beer (355 mL), one 5 oz glass of wine (148 mL), or one 1 oz glass of hard liquor (44 mL). Lifestyle Do not use any products that contain nicotine or tobacco. These products include cigarettes, chewing tobacco, and vaping devices, such as e-cigarettes. If you need help quitting, ask your health care provider. Do not use street drugs. Do not share needles. Ask your health care provider for help if you need support or information about quitting drugs. General instructions Schedule regular health, dental, and eye exams. Stay current with your vaccines. Tell your health care provider if: You often feel depressed. You have ever been abused or do not feel safe at home. Summary Adopting a healthy lifestyle and getting preventive care are important in promoting health and wellness. Follow your health care provider's instructions about healthy diet, exercising, and getting tested or screened for diseases. Follow your health care provider's instructions on monitoring your cholesterol and blood pressure. This information is not intended to replace advice given to you by your health care provider. Make sure you discuss any questions you have with your health care provider. Document Revised: 07/16/2020 Document Reviewed: 07/16/2020 Elsevier Patient Education  2023 Elsevier Inc.  

## 2022-06-02 ENCOUNTER — Ambulatory Visit (INDEPENDENT_AMBULATORY_CARE_PROVIDER_SITE_OTHER): Payer: Medicare HMO

## 2022-06-02 DIAGNOSIS — Z0000 Encounter for general adult medical examination without abnormal findings: Secondary | ICD-10-CM | POA: Diagnosis not present

## 2022-06-03 ENCOUNTER — Other Ambulatory Visit: Payer: Self-pay | Admitting: Family Medicine

## 2022-06-03 DIAGNOSIS — G8250 Quadriplegia, unspecified: Secondary | ICD-10-CM

## 2022-06-09 ENCOUNTER — Encounter: Payer: Self-pay | Admitting: Family Medicine

## 2022-06-09 ENCOUNTER — Ambulatory Visit (INDEPENDENT_AMBULATORY_CARE_PROVIDER_SITE_OTHER): Payer: Medicare HMO | Admitting: Family Medicine

## 2022-06-09 VITALS — BP 102/60 | HR 60

## 2022-06-09 DIAGNOSIS — Z13220 Encounter for screening for lipoid disorders: Secondary | ICD-10-CM

## 2022-06-09 DIAGNOSIS — Z0000 Encounter for general adult medical examination without abnormal findings: Secondary | ICD-10-CM

## 2022-06-09 DIAGNOSIS — Z125 Encounter for screening for malignant neoplasm of prostate: Secondary | ICD-10-CM | POA: Diagnosis not present

## 2022-06-09 DIAGNOSIS — G8250 Quadriplegia, unspecified: Secondary | ICD-10-CM

## 2022-06-09 MED ORDER — BACLOFEN 20 MG PO TABS: 20.0000 mg | ORAL_TABLET | Freq: Four times a day (QID) | ORAL | 2 refills | Status: DC

## 2022-06-09 NOTE — Assessment & Plan Note (Signed)
Baclofen refills provided. Patient unable to safely and adequately perform mobility related ADLs including toileting, dressing, feeding in the customary locations within his home and therefore requires ongoing use of power wheelchair. Patient does not have sufficient upper extremity range of motion or strength to propel manual wheelchair.

## 2022-06-09 NOTE — Progress Notes (Signed)
    SUBJECTIVE:   Chief complaint/HPI: annual examination  Andrew Sullivan is a 52 y.o. who presents today for an annual exam.   Current concerns: none  History tabs reviewed and updated.   Review of systems form reviewed and notable for: no advanced directives.   OBJECTIVE:   BP 102/60   Pulse 60   SpO2 96%   Gen: NAD, pleasant, sitting in wheelchair HEENT: [place]/AT, PERRLA, nares patent bilaterally, TM normal bilaterally, oropharynx unremarkable Neck: supple, no cervical or supraclavicular lymphadenopathy CV: RRR, normal S1/S2, no murmur Resp: Normal effort, lungs CTAB GI: abdomen soft, non-tender, non-distended Extremities: no edema or cyanosis Skin: warm and dry, no rashes noted Neuro: alert, normal speech, 0/5 strength bilateral lower extremities, mildly decreased ROM and strength of bilateral hands, sensation intact to light touch throughout Psych: Normal affect and mood   ASSESSMENT/PLAN:   Spastic tetraplegia (HCC) Baclofen refills provided. Patient unable to safely and adequately perform mobility related ADLs including toileting, dressing, feeding in the customary locations within his home and therefore requires ongoing use of power wheelchair. Patient does not have sufficient upper extremity range of motion or strength to propel manual wheelchair.     Annual Examination  See AVS for age appropriate recommendations.  PHQ score 2, reviewed and discussed.  Blood pressure value is at goal, discussed.   Considered the following screening exams based upon USPSTF recommendations: Diabetes screening: not indicated Screening for elevated cholesterol: ordered HIV testing: negative in 2018, repeat not indicated Hepatitis C: negative in 2021, repeat not indicated Hepatitis B:  not at high risk   Syphilis if at high risk:  not at high risk Reviewed risk factors for latent tuberculosis and not indicated Colorectal cancer screening: up to date on screening for CRC. Lung  cancer screening:  not indicated   PSA discussed and after engaging in discussion of possible risks, benefits and complications of screening patient elected to proceed. Advanced directives discussion: blue advanced directives packet given today. Patient states his Mom, Andrew Sullivan, would be HCPOA and he wants to be full code.  Follow up in 1 year or sooner if indicated.    Alcus Dad, MD [employer]

## 2022-06-09 NOTE — Patient Instructions (Addendum)
It was great to see you!  We are checking your cholesterol and PSA today. I will send you a MyChart message with the results.  Please return the blue Advanced Directives packet at your earliest convenience.   Get your Tdap vaccine at any pharmacy.  Follow up in 1 year or sooner if you have any concerns. -Dr Rock Nephew  Things to do to keep yourself healthy  - Exercise at least 30 minutes a day, 4-5 days a week.  - Eat a diet with lots of fruits and vegetables (5 servings per day). - Avoid sugar sweetened beverages and processed foods whenever possible. - Seatbelts can save your life. Wear them always.  - Safe sex - use a condom to prevent STIs. - Alcohol -  If you drink, do it moderately, less than 2 drinks per day. - Sleep - aim for 7-8 hours nightly. - Limit screen time to no more than 2 hours per day.  - [place]. Choose someone to make decisions for you if you are not able.  - Depression is common in our stressful world. If you're feeling down or losing interest in things you normally enjoy, please come in for a visit.  - Violence - If anyone is threatening or hurting you, please call immediately.

## 2022-06-10 LAB — LIPID PANEL
Chol/HDL Ratio: 2.7 ratio (ref 0.0–5.0)
Cholesterol, Total: 194 mg/dL (ref 100–199)
HDL: 72 mg/dL (ref >39)
LDL Chol Calc (NIH): 95 mg/dL (ref 0–99)
Triglycerides: 158 mg/dL — ABNORMAL HIGH (ref 0–149)
VLDL Cholesterol Cal: 27 mg/dL (ref 5–40)

## 2022-06-10 LAB — PSA: Prostate Specific Ag, Serum: 1.2 ng/mL (ref 0.0–4.0)

## 2022-06-16 DIAGNOSIS — R609 Edema, unspecified: Secondary | ICD-10-CM | POA: Diagnosis not present

## 2022-06-16 DIAGNOSIS — R532 Functional quadriplegia: Secondary | ICD-10-CM | POA: Diagnosis not present

## 2022-06-16 DIAGNOSIS — G8250 Quadriplegia, unspecified: Secondary | ICD-10-CM | POA: Diagnosis not present

## 2022-06-16 DIAGNOSIS — M62830 Muscle spasm of back: Secondary | ICD-10-CM | POA: Diagnosis not present

## 2022-06-19 DIAGNOSIS — G8250 Quadriplegia, unspecified: Secondary | ICD-10-CM | POA: Diagnosis not present

## 2022-06-28 ENCOUNTER — Other Ambulatory Visit: Payer: Self-pay | Admitting: Podiatry

## 2022-07-29 DIAGNOSIS — Z125 Encounter for screening for malignant neoplasm of prostate: Secondary | ICD-10-CM | POA: Diagnosis not present

## 2022-08-05 DIAGNOSIS — R310 Gross hematuria: Secondary | ICD-10-CM | POA: Diagnosis not present

## 2022-08-05 DIAGNOSIS — Z125 Encounter for screening for malignant neoplasm of prostate: Secondary | ICD-10-CM | POA: Diagnosis not present

## 2022-08-05 DIAGNOSIS — N319 Neuromuscular dysfunction of bladder, unspecified: Secondary | ICD-10-CM | POA: Diagnosis not present

## 2022-08-12 ENCOUNTER — Other Ambulatory Visit: Payer: Self-pay | Admitting: Physician Assistant

## 2022-08-12 DIAGNOSIS — I82461 Acute embolism and thrombosis of right calf muscular vein: Secondary | ICD-10-CM

## 2022-08-13 ENCOUNTER — Ambulatory Visit: Payer: Medicare HMO | Admitting: Physician Assistant

## 2022-08-13 ENCOUNTER — Other Ambulatory Visit: Payer: Medicare HMO

## 2022-08-21 ENCOUNTER — Other Ambulatory Visit: Payer: Self-pay | Admitting: Family Medicine

## 2022-08-21 DIAGNOSIS — D509 Iron deficiency anemia, unspecified: Secondary | ICD-10-CM

## 2022-09-04 ENCOUNTER — Other Ambulatory Visit: Payer: Self-pay | Admitting: Hematology and Oncology

## 2022-09-09 ENCOUNTER — Telehealth: Payer: Self-pay | Admitting: *Deleted

## 2022-09-09 NOTE — Telephone Encounter (Signed)
Pt called for a refill of Xarelto. Was last seen in November 2023, was a no show for June appt. Attempted to call patient back to remind him he needs to be seen, No answer. Message to scheduler to get missed appt rescheduled.

## 2022-09-15 ENCOUNTER — Telehealth: Payer: Self-pay | Admitting: Physician Assistant

## 2022-09-15 NOTE — Telephone Encounter (Signed)
Patient is aware of upcoming appointment times/dates.  

## 2022-10-21 ENCOUNTER — Other Ambulatory Visit: Payer: Self-pay | Admitting: Physician Assistant

## 2022-10-21 DIAGNOSIS — I82461 Acute embolism and thrombosis of right calf muscular vein: Secondary | ICD-10-CM

## 2022-10-21 DIAGNOSIS — D500 Iron deficiency anemia secondary to blood loss (chronic): Secondary | ICD-10-CM

## 2022-10-22 ENCOUNTER — Inpatient Hospital Stay (HOSPITAL_BASED_OUTPATIENT_CLINIC_OR_DEPARTMENT_OTHER): Payer: Medicare HMO | Admitting: Physician Assistant

## 2022-10-22 ENCOUNTER — Inpatient Hospital Stay: Payer: Medicare HMO | Attending: Physician Assistant

## 2022-10-22 ENCOUNTER — Other Ambulatory Visit: Payer: Self-pay

## 2022-10-22 VITALS — BP 107/80 | HR 70 | Temp 98.2°F

## 2022-10-22 DIAGNOSIS — M7989 Other specified soft tissue disorders: Secondary | ICD-10-CM | POA: Diagnosis not present

## 2022-10-22 DIAGNOSIS — I82461 Acute embolism and thrombosis of right calf muscular vein: Secondary | ICD-10-CM

## 2022-10-22 DIAGNOSIS — Z862 Personal history of diseases of the blood and blood-forming organs and certain disorders involving the immune mechanism: Secondary | ICD-10-CM | POA: Diagnosis not present

## 2022-10-22 DIAGNOSIS — Z7901 Long term (current) use of anticoagulants: Secondary | ICD-10-CM | POA: Diagnosis not present

## 2022-10-22 DIAGNOSIS — Z86718 Personal history of other venous thrombosis and embolism: Secondary | ICD-10-CM

## 2022-10-22 DIAGNOSIS — Z87891 Personal history of nicotine dependence: Secondary | ICD-10-CM | POA: Insufficient documentation

## 2022-10-22 DIAGNOSIS — I82501 Chronic embolism and thrombosis of unspecified deep veins of right lower extremity: Secondary | ICD-10-CM | POA: Diagnosis not present

## 2022-10-22 DIAGNOSIS — D500 Iron deficiency anemia secondary to blood loss (chronic): Secondary | ICD-10-CM

## 2022-10-22 LAB — CBC WITH DIFFERENTIAL (CANCER CENTER ONLY)
Abs Immature Granulocytes: 0 10*3/uL (ref 0.00–0.07)
Basophils Absolute: 0 10*3/uL (ref 0.0–0.1)
Basophils Relative: 1 %
Eosinophils Absolute: 0.1 10*3/uL (ref 0.0–0.5)
Eosinophils Relative: 3 %
HCT: 42.1 % (ref 39.0–52.0)
Hemoglobin: 14 g/dL (ref 13.0–17.0)
Immature Granulocytes: 0 %
Lymphocytes Relative: 21 %
Lymphs Abs: 0.8 10*3/uL (ref 0.7–4.0)
MCH: 30.9 pg (ref 26.0–34.0)
MCHC: 33.3 g/dL (ref 30.0–36.0)
MCV: 92.9 fL (ref 80.0–100.0)
Monocytes Absolute: 0.4 10*3/uL (ref 0.1–1.0)
Monocytes Relative: 9 %
Neutro Abs: 2.5 10*3/uL (ref 1.7–7.7)
Neutrophils Relative %: 66 %
Platelet Count: 183 10*3/uL (ref 150–400)
RBC: 4.53 MIL/uL (ref 4.22–5.81)
RDW: 11.7 % (ref 11.5–15.5)
WBC Count: 3.8 10*3/uL — ABNORMAL LOW (ref 4.0–10.5)
nRBC: 0 % (ref 0.0–0.2)

## 2022-10-22 LAB — CMP (CANCER CENTER ONLY)
ALT: 17 U/L (ref 0–44)
AST: 24 U/L (ref 15–41)
Albumin: 3.9 g/dL (ref 3.5–5.0)
Alkaline Phosphatase: 48 U/L (ref 38–126)
Anion gap: 7 (ref 5–15)
BUN: 22 mg/dL — ABNORMAL HIGH (ref 6–20)
CO2: 27 mmol/L (ref 22–32)
Calcium: 8.6 mg/dL — ABNORMAL LOW (ref 8.9–10.3)
Chloride: 109 mmol/L (ref 98–111)
Creatinine: 1.08 mg/dL (ref 0.61–1.24)
GFR, Estimated: >60 mL/min (ref >60)
Glucose, Bld: 86 mg/dL (ref 70–99)
Potassium: 4.3 mmol/L (ref 3.5–5.1)
Sodium: 143 mmol/L (ref 135–145)
Total Bilirubin: 0.5 mg/dL (ref 0.3–1.2)
Total Protein: 6.6 g/dL (ref 6.5–8.1)

## 2022-10-22 LAB — IRON AND IRON BINDING CAPACITY (CC-WL,HP ONLY)
Iron: 65 ug/dL (ref 45–182)
Saturation Ratios: 21 % (ref 17.9–39.5)
TIBC: 318 ug/dL (ref 250–450)
UIBC: 253 ug/dL (ref 117–376)

## 2022-10-22 LAB — FERRITIN: Ferritin: 31 ng/mL (ref 24–336)

## 2022-10-22 NOTE — Progress Notes (Signed)
[facility] [phone]   Fax:([phone]  PROGRESS NOTE  Patient Care Team: Vonna Drafts, MD as PCP - General (Family Medicine) Berneice Heinrich Delbert Phenix., MD as Consulting Physician (Urology) Ernesto Rutherford, MD as Referring Physician (Ophthalmology) Rhea Belton Carie Caddy, MD as Consulting Physician (Gastroenterology)  Hematological/Oncological History # Unprovoked Right Lower Extremity DVT  10/14/2018: Lower Extremity US showed acute deep vein thrombosis involving the right femoral vein, right popliteal vein, right posterior tibial veins, and right peroneal veins. 10/22/2020: Lower Extremity US showed an acute deep vein thrombosis involving the right posterior tibial veins, and right gastrocnemius veins 08/07/2021: establish care with Dr. Leonides Schanz   Interval History:  Andrew Sullivan 52 y.o. male with medical history significant for lower extremity DVT who presents for a follow up visit. The patient's last visit was on 02/06/2022 at which time he established care. In the interim since the last visit he has continued on Xarelto maintenance therapy.   On exam today Andrew Sullivan reports he is tolerating Xarelto therapy without any side effects. He denies any bruising or bleeding episodes. He denies any cost burden with the medication. He does have lower extremity swelling, right greater than left. He reports the swelling improves with elevation and he wears compression stockings. He is otherwise feeling well without any changes to his appetite or energy levels. He denies fevers, chills, sweats, shortness of breath, chest pain or cough.   A full 10 point ROS was otherwise negative.  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 89 in 53   Spastic tetraplegia (HCC)     SURGICAL HISTORY: Past Surgical History:  Procedure Laterality Date   BIOPSY  06/26/2020   Procedure: BIOPSY;  Surgeon: Beverley Fiedler, MD;  Location: [facility];  Service: Gastroenterology;;  EGD and COLON   CERVICAL FUSION  1988   C4 -- C6 w/ spinal cord injury   COLONOSCOPY WITH PROPOFOL N/A 06/26/2020   Procedure: COLONOSCOPY WITH PROPOFOL;  Surgeon: Beverley Fiedler, MD;  Location: [facility];  Service: Gastroenterology;  Laterality: N/A;   CYSTOSCOPY WITH RETROGRADE PYELOGRAM, URETEROSCOPY AND STENT PLACEMENT Left 12/27/2013   Procedure: CYSTOSCOPY WITH RETROGRADE PYELOGRAM, URETEROSCOPY AND STENT PLACEMENT;  Surgeon: Martina Sinner, MD;  Location: [facility];  Service: Urology;  Laterality: Left;   CYSTOSCOPY/RETROGRADE/URETEROSCOPY Left 01/09/2014   Procedure: CYSTOSCOPY/RETROGRADE/URETEROSCOPY, URETERAL BIOPSY AND STENT EXCHANGE;  Surgeon: Sebastian Ache, MD;  Location: [facility];  Service: Urology;  Laterality: Left;   ESOPHAGOGASTRODUODENOSCOPY (EGD) WITH PROPOFOL N/A 06/26/2020   Procedure: ESOPHAGOGASTRODUODENOSCOPY (EGD) WITH PROPOFOL;  Surgeon: Beverley Fiedler, MD;  Location: [facility];  Service: Gastroenterology;  Laterality: N/A;   HIP ARTHROPLASTY  08/12/2011   Procedure: ARTHROPLASTY BIPOLAR HIP;  Surgeon: Shelda Pal, MD;  Location: [facility];  Service: Orthopedics;  Laterality: Left;  Hemi-arthroplasty   POLYPECTOMY  06/26/2020   Procedure: POLYPECTOMY;  Surgeon: Beverley Fiedler, MD;  Location: [facility];  Service: Gastroenterology;;  SOCIAL HISTORY: Social History   Socioeconomic History   Marital status: Single    Spouse name: Not on file   Number of children: 0   Years of education: 12   Highest education level: 12th grade  Occupational  History   Occupation: Disability  Tobacco Use   Smoking status: Former    Current packs/day: 0.00    Average packs/day: 0.3 packs/day for 0.5 years (0.1 ttl pk-yrs)    Types: Cigarettes    Start date: 06/28/1986    Quit date: 12/27/1986    Years since quitting: 35.8    Passive exposure: Past   Smokeless tobacco: Never  Vaping Use   Vaping status: Never Used  Substance and Sexual Activity   Alcohol use: No    Comment: hx of use 1980s   Drug use: No   Sexual activity: Not Currently  Other Topics Concern   Not on file  Social History Narrative   Patient lives with his mother and step father.   Is on disability secondary to neck fracture.    Stays active going to gym and church. Uses [employer] for transportation or local bus.   Enjoys going to the movies and playing cards.    Social Determinants of Health   Financial Resource Strain: Low Risk  (06/02/2022)   Overall Financial Resource Strain (CARDIA)    Difficulty of Paying Living Expenses: Not hard at all  Food Insecurity: No Food Insecurity (06/02/2022)   Hunger Vital Sign    Worried About Running Out of Food in the Last Year: Never true    Ran Out of Food in the Last Year: Never true  Transportation Needs: No Transportation Needs (06/02/2022)   PRAPARE - Administrator, Civil Service (Medical): No    Lack of Transportation (Non-Medical): No  Physical Activity: Sufficiently Active (06/02/2022)   Exercise Vital Sign    Days of Exercise per Week: 3 days    Minutes of Exercise per Session: 90 min  Stress: No Stress Concern Present (06/02/2022)   [employer] of Occupational Health - Occupational Stress Questionnaire    Feeling of Stress : Not at all  Social Connections: Moderately Integrated (06/02/2022)   Social Connection and Isolation Panel [NHANES]    Frequency of Communication with Friends and Family: More than three times a week    Frequency of Social Gatherings with Friends and Family: Twice a week    Attends  Religious Services: More than 4 times per year    Active Member of [employer] or Organizations: Yes    Attends Engineer, structural: More than 4 times per year    Marital Status: Never married  Intimate Partner Violence: Not At Risk (06/02/2022)   Humiliation, Afraid, Rape, and Kick questionnaire    Fear of Current or Ex-Partner: No    Emotionally Abused: No    Physically Abused: No    Sexually Abused: No    FAMILY HISTORY: Family History  Problem Relation Age of Onset   Arthritis Mother    Hypertension Mother    Colon cancer Neg Hx    Esophageal cancer Neg Hx    Pancreatic cancer Neg Hx    Stomach cancer Neg Hx    Liver disease Neg Hx     ALLERGIES:  is allergic to penicillins.  MEDICATIONS:  Current Outpatient Medications  Medication Sig Dispense Refill   baclofen (LIORESAL) 20 MG tablet Take 1 tablet (20 mg total) by mouth 4 (four) times daily. 120 tablet 2  Ferrous Sulfate (IRON) 325 (65 Fe) MG TABS TAKE 1 TABLET BY MOUTH EVERY OTHER DAY 90 tablet 0   tadalafil (CIALIS) 5 MG tablet Take 5 mg by mouth daily as needed for erectile dysfunction.     XARELTO 10 MG TABS tablet Take 1 tablet by mouth once daily 90 tablet 0   No current facility-administered medications for this visit.    REVIEW OF SYSTEMS:   Constitutional: ( - ) fevers, ( - )  chills , ( - ) night sweats Eyes: ( - ) blurriness of vision, ( - ) double vision, ( - ) watery eyes Ears, nose, mouth, throat, and face: ( - ) mucositis, ( - ) sore throat Respiratory: ( - ) cough, ( - ) dyspnea, ( - ) wheezes Cardiovascular: ( - ) palpitation, ( - ) chest discomfort, ( + ) lower extremity swelling Gastrointestinal:  ( - ) nausea, ( - ) heartburn, ( - ) change in bowel habits Skin: ( - ) abnormal skin rashes Lymphatics: ( - ) new lymphadenopathy, ( - ) easy bruising Neurological: ( - ) numbness, ( - ) tingling, ( - ) new weaknesses Behavioral/Psych: ( - ) mood change, ( - ) new changes  All other systems were  reviewed with the patient and are negative.  PHYSICAL EXAMINATION:  Vitals:   10/22/22 0852  BP: 107/80  Pulse: 70  Temp: 98.2 F (36.8 C)  SpO2: 100%   Filed Weights    GENERAL: alert, no distress and comfortable SKIN: skin color, texture, turgor are normal, no rashes or significant lesions EYES: conjunctiva are pink and non-injected, sclera clear LUNGS: clear to auscultation and percussion with normal breathing effort HEART: regular rate & rhythm and no murmurs. B/l lower extremity edema Musculoskeletal: no cyanosis of digits and no clubbing  PSYCH: alert & oriented x 3, fluent speech NEURO: no focal motor/sensory deficits  LABORATORY DATA:  I have reviewed the data as listed    Latest Ref Rng & Units 10/22/2022    8:18 AM 02/06/2022   10:40 AM 08/07/2021    2:17 PM  CBC  WBC 4.0 - 10.5 K/uL 3.8  4.0  3.8   Hemoglobin 13.0 - 17.0 g/dL 91.4  78.2  95.6   Hematocrit 39.0 - 52.0 % 42.1  41.4  43.5   Platelets 150 - 400 K/uL 183  179  181        Latest Ref Rng & Units 02/06/2022   10:40 AM 08/07/2021    2:17 PM 11/13/2020    8:09 PM  CMP  Glucose 70 - 99 mg/dL 94  99  213   BUN 6 - 20 mg/dL 20  27  16    Creatinine 0.61 - 1.24 mg/dL 0.86  5.78  4.69   Sodium 135 - 145 mmol/L 142  141  138   Potassium 3.5 - 5.1 mmol/L 4.3  4.2  4.3   Chloride 98 - 111 mmol/L 107  110  107   CO2 22 - 32 mmol/L 31  25  26    Calcium 8.9 - 10.3 mg/dL 9.4  9.2  8.7   Total Protein 6.5 - 8.1 g/dL 7.0  7.1    Total Bilirubin 0.3 - 1.2 mg/dL 0.5  0.9    Alkaline Phos 38 - 126 U/L 50  48    AST 15 - 41 U/L 23  27    ALT 0 - 44 U/L 17  20      RADIOGRAPHIC STUDIES: No  results found.  ASSESSMENT & PLAN Andrew Sullivan is a 52 y.o. male with medical history significant for lower extremity DVT who presents for a follow up visit.After review of the labs, review of the records, and discussion with the patient the patients findings are most consistent with recurrent unprovoked right lower  extremity DVTs.   # Unprovoked Recurrent Right Lower Extremity DVTs --findings at this time are consistent with recurrent unprovoked VTE  --will order baseline CMP and CBC to assure labs are adequate for DOAC therapy   --continue maintenance dose xarelto 10mg  --patient denies any bleeding, bruising, or dark stools on this medication. It is well tolerated. No difficulties accessing/affording the medication  --Labs today show white blood cell count 3.8, hemoglobin 14.0, and platelets 183. Creatinine and LFTs normal. --RTC in 6 months' time with strict return precautions for overt signs of bleeding.      No orders of the defined types were placed in this encounter.   All questions were answered. The patient knows to call the clinic with any problems, questions or concerns.  I have spent a total of 25 minutes minutes of face-to-face and non-face-to-face time, preparing to see the patient, performing a medically appropriate examination, counseling and educating the patient,documenting clinical information in the electronic health record, and care coordination.   Georga Kaufmann PA-C Dept of Hematology and Oncology [facility] Phone: [phone]   10/22/2022 8:55 AM

## 2022-12-11 DIAGNOSIS — H04123 Dry eye syndrome of bilateral lacrimal glands: Secondary | ICD-10-CM | POA: Diagnosis not present

## 2022-12-11 DIAGNOSIS — H2513 Age-related nuclear cataract, bilateral: Secondary | ICD-10-CM | POA: Diagnosis not present

## 2022-12-13 ENCOUNTER — Other Ambulatory Visit: Payer: Self-pay | Admitting: Hematology and Oncology

## 2023-02-23 ENCOUNTER — Other Ambulatory Visit: Payer: Self-pay

## 2023-02-23 ENCOUNTER — Emergency Department (HOSPITAL_COMMUNITY)
Admission: EM | Admit: 2023-02-23 | Discharge: 2023-02-23 | Discharge status: Against Medical Advice | Payer: Medicare HMO | Attending: Emergency Medicine | Admitting: Emergency Medicine

## 2023-02-23 ENCOUNTER — Encounter (HOSPITAL_COMMUNITY): Payer: Self-pay

## 2023-02-23 ENCOUNTER — Emergency Department (HOSPITAL_COMMUNITY): Payer: Medicare HMO

## 2023-02-23 DIAGNOSIS — M7989 Other specified soft tissue disorders: Secondary | ICD-10-CM | POA: Diagnosis not present

## 2023-02-23 DIAGNOSIS — L02413 Cutaneous abscess of right upper limb: Secondary | ICD-10-CM | POA: Insufficient documentation

## 2023-02-23 DIAGNOSIS — Z5321 Procedure and treatment not carried out due to patient leaving prior to being seen by health care provider: Secondary | ICD-10-CM | POA: Diagnosis not present

## 2023-02-23 DIAGNOSIS — M25521 Pain in right elbow: Secondary | ICD-10-CM | POA: Diagnosis not present

## 2023-02-23 LAB — SEDIMENTATION RATE: Sed Rate: 14 mm/h (ref 0–16)

## 2023-02-23 LAB — CBC
HCT: 44.3 % (ref 39.0–52.0)
Hemoglobin: 14.4 g/dL (ref 13.0–17.0)
MCH: 31.2 pg (ref 26.0–34.0)
MCHC: 32.5 g/dL (ref 30.0–36.0)
MCV: 96.1 fL (ref 80.0–100.0)
Platelets: 210 10*3/uL (ref 150–400)
RBC: 4.61 MIL/uL (ref 4.22–5.81)
RDW: 11.6 % (ref 11.5–15.5)
WBC: 3.8 10*3/uL — ABNORMAL LOW (ref 4.0–10.5)
nRBC: 0 % (ref 0.0–0.2)

## 2023-02-23 LAB — BASIC METABOLIC PANEL
Anion gap: 7 (ref 5–15)
BUN: 17 mg/dL (ref 6–20)
CO2: 27 mmol/L (ref 22–32)
Calcium: 9.1 mg/dL (ref 8.9–10.3)
Chloride: 107 mmol/L (ref 98–111)
Creatinine, Ser: 1.03 mg/dL (ref 0.61–1.24)
GFR, Estimated: >60 mL/min (ref >60)
Glucose, Bld: 99 mg/dL (ref 70–99)
Potassium: 4 mmol/L (ref 3.5–5.1)
Sodium: 141 mmol/L (ref 135–145)

## 2023-02-23 LAB — C-REACTIVE PROTEIN: CRP: 1.1 mg/dL — ABNORMAL HIGH (ref <1.0)

## 2023-02-23 NOTE — ED Notes (Signed)
Called pt multiple times for vitals. No response

## 2023-02-23 NOTE — ED Provider Triage Note (Signed)
Emergency Medicine Provider Triage Evaluation Note  Andrew Sullivan , a 52 y.o. male  was evaluated in triage.  Pt complains of abscess on right elbow. Noticed it November 16th. Patient opened it up on its own and it has been draining. Denies fever and chills. Denies trauma.   Review of Systems  Positive: See above Negative: See above  Physical Exam  BP 133/70 (BP Location: Left Arm)   Pulse 68   Temp 98.3 F (36.8 C)   Resp 16   SpO2 99%  Gen:   Awake, no distress   Resp:  Normal effort  MSK:   Moves extremities without difficulty  Other:    Medical Decision Making  Medically screening exam initiated at 2:43 PM.  Appropriate orders placed.  Andrew Sullivan was informed that the remainder of the evaluation will be completed by another provider, this initial triage assessment does not replace that evaluation, and the importance of remaining in the ED until their evaluation is complete.  Work up started   Andrew Eagle, PA-C 02/23/23 1444

## 2023-02-23 NOTE — ED Triage Notes (Signed)
Pt c.o abscess to right elbow since nov. Pt states it opened up on its own and started to drain and it is not getting any better. Denies fever or pain

## 2023-02-24 ENCOUNTER — Other Ambulatory Visit: Payer: Self-pay

## 2023-02-24 ENCOUNTER — Emergency Department (HOSPITAL_COMMUNITY)
Admission: EM | Admit: 2023-02-24 | Discharge: 2023-02-24 | Discharge status: Against Medical Advice | Payer: Medicare HMO | Attending: Emergency Medicine | Admitting: Emergency Medicine

## 2023-02-24 ENCOUNTER — Encounter (HOSPITAL_COMMUNITY): Payer: Self-pay

## 2023-02-24 DIAGNOSIS — Z5321 Procedure and treatment not carried out due to patient leaving prior to being seen by health care provider: Secondary | ICD-10-CM | POA: Diagnosis not present

## 2023-02-24 DIAGNOSIS — M25529 Pain in unspecified elbow: Secondary | ICD-10-CM | POA: Insufficient documentation

## 2023-02-24 NOTE — ED Triage Notes (Addendum)
Patient reports he was here yesterday and left before his results.  Denies pain to that elbow today. Patient is wheelchair bound.  Patient CRP 1.1 yesterday.

## 2023-02-26 ENCOUNTER — Other Ambulatory Visit: Payer: Self-pay

## 2023-02-26 DIAGNOSIS — D509 Iron deficiency anemia, unspecified: Secondary | ICD-10-CM

## 2023-02-26 DIAGNOSIS — G8250 Quadriplegia, unspecified: Secondary | ICD-10-CM

## 2023-02-26 MED ORDER — IRON 325 (65 FE) MG PO TABS: 1.0000 | ORAL_TABLET | ORAL | 0 refills | Status: DC

## 2023-02-26 MED ORDER — BACLOFEN 20 MG PO TABS: 20.0000 mg | ORAL_TABLET | Freq: Four times a day (QID) | ORAL | 2 refills | Status: DC

## 2023-02-27 ENCOUNTER — Ambulatory Visit (INDEPENDENT_AMBULATORY_CARE_PROVIDER_SITE_OTHER): Payer: Medicare HMO | Admitting: Student

## 2023-02-27 VITALS — BP 125/69 | HR 63

## 2023-02-27 DIAGNOSIS — M7021 Olecranon bursitis, right elbow: Secondary | ICD-10-CM | POA: Diagnosis not present

## 2023-02-27 MED ORDER — NAPROXEN 500 MG PO TABS: 500.0000 mg | ORAL_TABLET | Freq: Four times a day (QID) | ORAL | 2 refills | Status: AC | PRN

## 2023-02-27 NOTE — Patient Instructions (Addendum)
Pleasure to meet you.  Suspect the swelling on your right elbow is most likely due to Olecranon bursitis.  Today we applied Ace bandage to provide compression over the elbow area and referral to an orthopedic doctor has been placed.  Make sure to continue applying this Ace bandage over the swellings on your elbow daily.  Please make sure you use soft support on your hand rest for your wheelchair.  Placed referral for the company to send you confusion for your armrest.

## 2023-02-27 NOTE — Progress Notes (Signed)
    SUBJECTIVE:   CHIEF COMPLAINT / HPI:   52 year old male history of quadriplegia who is wheelchair-bound Presenting today due to concerns of right elbow swelling He started noticing swelling about a month ago Swelling has progressively gotten worse In the last few weeks swelling started draining He has denied having any fever, chills. Lesion is tender with palpation but generally not painful.  PERTINENT  PMH / PSH: Reviewed   OBJECTIVE:   BP 125/69   Pulse 63   SpO2 100%    Physical Exam General: Alert, wheelchair bound, NAD Cardiovascular: RRR, No Murmurs, Normal S2/S2 Respiratory: CTAB, No wheezing or Rales Abdomen: No distension or tenderness Extremities: about 8 cm mobile flatulent nodule over the right elbow with central     ASSESSMENT/PLAN:   Olecranon bursitis of right elbow Differential include Olecranon bursitis vs abscess.  More suspicious of olecranon bursitis over abscess given the absence of infectious signs such as fever, purulent discharge or surrounding erythema. Patient who is wheelchair bound requires frequent use of his above to pivot when changing position which increases his risk of inflammation leading to bursitis.  And would benefit from padding of his armrest for his wheelchair to reduce reoccurrence of this. -Wrapped lesion  with Ace bandage -Rx as needed naproxen for pain -Placed referral to orthopedic surgery -DME order placed for armrest support for wheelchair     Jerre Simon, MD [facility]

## 2023-02-27 NOTE — Assessment & Plan Note (Signed)
Differential include Olecranon bursitis vs abscess.  More suspicious of olecranon bursitis over abscess given the absence of infectious signs such as fever, purulent discharge or surrounding erythema. Patient who is wheelchair bound requires frequent use of his above to pivot when changing position which increases his risk of inflammation leading to bursitis.  And would benefit from padding of his armrest for his wheelchair to reduce reoccurrence of this. -Wrapped lesion  with Ace bandage -Rx as needed naproxen for pain -Placed referral to orthopedic surgery -DME order placed for armrest support for wheelchair

## 2023-03-16 ENCOUNTER — Telehealth: Payer: Self-pay

## 2023-03-16 ENCOUNTER — Other Ambulatory Visit: Payer: Self-pay | Admitting: Hematology and Oncology

## 2023-03-16 NOTE — Telephone Encounter (Signed)
 Community message sent to [employer] for arm rest cushion for patient's wheelchair.   Will await response.   Veronda Prude, RN

## 2023-03-25 ENCOUNTER — Encounter: Payer: Self-pay | Admitting: Surgical

## 2023-03-25 ENCOUNTER — Ambulatory Visit (INDEPENDENT_AMBULATORY_CARE_PROVIDER_SITE_OTHER): Payer: Medicare HMO | Admitting: Surgical

## 2023-03-25 DIAGNOSIS — M25521 Pain in right elbow: Secondary | ICD-10-CM | POA: Diagnosis not present

## 2023-03-25 DIAGNOSIS — M7021 Olecranon bursitis, right elbow: Secondary | ICD-10-CM | POA: Diagnosis not present

## 2023-03-25 MED ORDER — LIDOCAINE HCL 1 % IJ SOLN
5.0000 mL | INTRAMUSCULAR | Status: AC | PRN
  Administered 2023-03-25: 5 mL

## 2023-03-25 NOTE — Progress Notes (Signed)
Office Visit Note   Patient: Andrew Sullivan           Date of Birth: 1970-09-13           MRN: [id number] Visit Date: 03/25/2023 Requested by: McDiarmid, Leighton Roach, MD [address] [place],  [place] [address] PCP: Vonna Drafts, MD  Subjective: Chief Complaint  Patient presents with   Right Elbow - Pain    HPI: Andrew Sullivan is a 53 y.o. male who presents to the office reporting right elbow discomfort and swelling.  Patient reports that he first noticed swelling to the olecranon region on January 24, 2023.  This began as a small bump and now has become larger.  It is uncomfortable when he puts pressure on his elbow.  He is paraplegic with some weakness of his upper extremities as well.  Ambulates with a wheelchair.  Has to use his elbow in order to push off of his chair arm to lift himself up at times.  He denies any drainage.  He does note that there is a small scab on the tip of his olecranon.  No fevers or chills.  Really does not cause him any significant pain except at night.  Never wakes him up from sleep.  He is right-hand dominant.  Not taking any pain medicine for this problem.  He saw his primary care provider who provided him with Ace bandage and recommended compression but this has not resolved his issue..                ROS: All systems reviewed are negative as they relate to the chief complaint within the history of present illness.  Patient denies fevers or chills.  Assessment & Plan: Visit Diagnoses:  1. Olecranon bursitis of right elbow   2. Pain in right elbow     Plan: Patient is a 53 year old male who presents for evaluation of right elbow swelling.  Has olecranon bursitis that does not appear septic based on his lack of fevers, chills, significant pain, warmth on exam.  No sinus tract noted.  Seems that this is a likely chronic condition for him based on the constant resting of the elbow on his chair arm and the fact that he has to use his arm in order to  push himself up off of the chair arm to lift his body in his wheelchair.  We discussed options availed the patient.  He would like to try aspiration.  22 cc was aspirated from the olecranon bursa with care taken to avoid the ulnar nerve on the medial aspect of the elbow.  1 cc of Toradol was injected following aspiration.  Fluid will be sent off for fluid analysis; did not appear purulent.  Patient tolerated procedure well.  We will call him with results and discuss next steps.  Follow-Up Instructions: No follow-ups on file.   Orders:  Orders Placed This Encounter  Procedures   Anaerobic and Aerobic Culture   Synovial fluid, crystal   Cell Count + Diff, w/o Cryst, Synvl Fld.   No orders of the defined types were placed in this encounter.     Procedures: Medium Joint Inj: R olecranon bursa on 03/25/2023 5:03 PM Details: 22 G 1.5 in needle, lateral approach Medications: 5 mL lidocaine 1 % Aspirate: 22 mL yellow; sent for lab analysis Outcome: tolerated well, no immediate complications  22 cc of nonpurulent inflammatory fluid aspirated from the olecranon bursa.  1 cc Toradol injected. Procedure, treatment alternatives,  risks and benefits explained, specific risks discussed. Consent was given by the patient. Immediately prior to procedure a time out was called to verify the correct patient, procedure, equipment, support staff and site/side marked as required. Patient was prepped and draped in the usual sterile fashion.       Clinical Data: No additional findings.  Objective: Vital Signs: There were no vitals taken for this visit.  Physical Exam:  Constitutional: Patient appears well-developed HEENT:  Head: Normocephalic Eyes:EOM are normal Neck: Normal range of motion Cardiovascular: Normal rate Pulmonary/chest: Effort normal Neurologic: Patient is alert Skin: Skin is warm Psychiatric: Patient has normal mood and affect  Ortho Exam: Ortho exam demonstrates right elbow with  swelling in the olecranon bursa.  This has no significant warmth or tenderness.  He has painless passive motion of the elbow as well as active motion of the elbow.  Intact bicep and tricep strength.  Does have some reduced motor function of his hands but intact wrist extension and flexion.  On the tip of the olecranon there is a small area of eschar that was unroofed in order to ensure there is no sinus communicating with possibly infected bursa.  There is no sinus tract and no expressible drainage despite intense pressure placed on the bursa in clinic today.  Specialty Comments:  No specialty comments available.  Imaging: No results found.   PMFS History: Patient Active Problem List   Diagnosis Date Noted   Olecranon bursitis of right elbow 02/27/2023   Inflamed external hemorrhoid 01/04/2021   Abnormal vision screen 09/25/2020   Benign neoplasm of transverse colon    Anal polyp    Iron deficiency anemia 06/07/2020   DVT (deep venous thrombosis) (HCC) 10/19/2018   Erectile dysfunction    Spastic tetraplegia (HCC) 12/06/2013   History of recurrent UTIs 05/19/2010   Functional quadriplegia (HCC) 05/10/2009   Past Medical History:  Diagnosis Date   Anemia    DVT (deep venous thrombosis) (HCC)    Erectile dysfunction    Gross hematuria    History of recurrent UTIs    History of spinal cord injury    52 (age 87 fell from ladder) w/ cervical spine fx  s/p  c4 -- c6  fusion--  residual paraplegic bilateral legs (great below T8 uses w/c and can transfer self   Paraparesis of both lower limbs (HCC)    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 67 in 13   Spastic tetraplegia (HCC)     Family History  Problem Relation Age of Onset   Arthritis Mother    Hypertension Mother    Colon cancer Neg Hx    Esophageal cancer Neg Hx    Pancreatic cancer Neg Hx    Stomach cancer Neg Hx    Liver disease Neg Hx     Past Surgical History:  Procedure Laterality Date   BIOPSY  06/26/2020    Procedure: BIOPSY;  Surgeon: Beverley Fiedler, MD;  Location: [facility];  Service: Gastroenterology;;  EGD and COLON   CERVICAL FUSION  1988   C4 -- C6 w/ spinal cord injury   COLONOSCOPY WITH PROPOFOL N/A 06/26/2020   Procedure: COLONOSCOPY WITH PROPOFOL;  Surgeon: Beverley Fiedler, MD;  Location: [facility];  Service: Gastroenterology;  Laterality: N/A;   CYSTOSCOPY WITH RETROGRADE PYELOGRAM, URETEROSCOPY AND STENT PLACEMENT Left 12/27/2013   Procedure: CYSTOSCOPY WITH RETROGRADE PYELOGRAM, URETEROSCOPY AND STENT PLACEMENT;  Surgeon: Martina Sinner, MD;  Location: [facility] [place];  Service: Urology;  Laterality: Left;   CYSTOSCOPY/RETROGRADE/URETEROSCOPY Left 01/09/2014   Procedure: CYSTOSCOPY/RETROGRADE/URETEROSCOPY, URETERAL BIOPSY AND STENT EXCHANGE;  Surgeon: Sebastian Ache, MD;  Location: [facility];  Service: Urology;  Laterality: Left;   ESOPHAGOGASTRODUODENOSCOPY (EGD) WITH PROPOFOL N/A 06/26/2020   Procedure: ESOPHAGOGASTRODUODENOSCOPY (EGD) WITH PROPOFOL;  Surgeon: Beverley Fiedler, MD;  Location: [facility];  Service: Gastroenterology;  Laterality: N/A;   HIP ARTHROPLASTY  08/12/2011   Procedure: ARTHROPLASTY BIPOLAR HIP;  Surgeon: Shelda Pal, MD;  Location: [facility];  Service: Orthopedics;  Laterality: Left;  Hemi-arthroplasty   POLYPECTOMY  06/26/2020   Procedure: POLYPECTOMY;  Surgeon: Beverley Fiedler, MD;  Location: [facility];  Service: Gastroenterology;;   Social History   Occupational History   Occupation: Disability  Tobacco Use   Smoking status: Former    Current packs/day: 0.00    Average packs/day: 0.3 packs/day for 0.5 years (0.1 ttl pk-yrs)    Types: Cigarettes    Start date: 06/28/1986    Quit date: 12/27/1986    Years since quitting: 36.2    Passive exposure: Past   Smokeless tobacco: Never  Vaping Use   Vaping status: Never Used  Substance and Sexual Activity   Alcohol use: No    Comment: hx of use 1980s   Drug use: No   Sexual activity: Not  Currently

## 2023-03-31 LAB — ANAEROBIC AND AEROBIC CULTURE
AER RESULT:: NO GROWTH
MICRO NUMBER:: 15959000
MICRO NUMBER:: 15959001
SPECIMEN QUALITY:: ADEQUATE
SPECIMEN QUALITY:: ADEQUATE

## 2023-03-31 LAB — CELL COUNT + DIFF, W/O CRYST-SYNVL FLD
Basophils, %: 0 %
Eosinophils-Synovial: 0 % (ref 0–2)
Lymphocytes-Synovial Fld: 70 % (ref 0–74)
Monocyte/Macrophage: 21 % (ref 0–69)
Neutrophil, Synovial: 4 % (ref 0–24)
Synoviocytes, %: 5 % (ref 0–15)
WBC, Synovial: 298 {cells}/uL — ABNORMAL HIGH (ref <150)

## 2023-03-31 LAB — SYNOVIAL FLUID, CRYSTAL

## 2023-04-01 NOTE — Progress Notes (Signed)
MADE APPT

## 2023-04-01 NOTE — Progress Notes (Signed)
Andrew Sullivan, can you call Merlyn Albert at some point and just get him an appointment for aspiration and cortisone injection this time instead of Toradol?  I discussed fluid results with him

## 2023-04-03 ENCOUNTER — Telehealth: Payer: Self-pay | Admitting: Family

## 2023-04-03 ENCOUNTER — Ambulatory Visit (INDEPENDENT_AMBULATORY_CARE_PROVIDER_SITE_OTHER): Payer: Medicare HMO | Admitting: Surgical

## 2023-04-03 ENCOUNTER — Encounter: Payer: Self-pay | Admitting: Surgical

## 2023-04-03 DIAGNOSIS — M7021 Olecranon bursitis, right elbow: Secondary | ICD-10-CM | POA: Diagnosis not present

## 2023-04-03 MED ORDER — LIDOCAINE HCL 1 % IJ SOLN
3.0000 mL | INTRAMUSCULAR | Status: AC | PRN
  Administered 2023-04-03: 3 mL

## 2023-04-03 MED ORDER — BUPIVACAINE HCL 0.25 % IJ SOLN
1.0000 mL | INTRAMUSCULAR | Status: AC | PRN
  Administered 2023-04-03: 1 mL via INTRA_ARTICULAR

## 2023-04-03 MED ORDER — METHYLPREDNISOLONE ACETATE 40 MG/ML IJ SUSP
40.0000 mg | INTRAMUSCULAR | Status: AC | PRN
  Administered 2023-04-03: 40 mg via INTRA_ARTICULAR

## 2023-04-03 NOTE — Telephone Encounter (Signed)
Patient saw Franky Macho today for right elbow. He has an ulcer on this elbow and a felt donut was placed for protection. Per Franky Macho would like to have patient f/u with Dr. Lajoyce Corners or Denny Peon in 2-3 weeks. Will call to make an appt.

## 2023-04-03 NOTE — Progress Notes (Signed)
   Procedure Note  Patient: Andrew Sullivan             Date of Birth: 05-15-1970           MRN: [id number]             Visit Date: 04/03/2023  Procedures: Visit Diagnoses:  1. Olecranon bursitis of right elbow     Medium Joint Inj: R olecranon bursa on 04/03/2023 1:15 PM Indications: diagnostic evaluation and pain Details: 25 G 1.5 in needle, lateral approach Medications: 3 mL lidocaine 1 %; 1 mL bupivacaine 0.25 %; 40 mg methylPREDNISolone acetate 40 MG/ML Outcome: tolerated well, no immediate complications  Patient returns.  Had right olecranon bursa aspiration at last office visit with fluid analysis demonstrating no infection.  He does have what appears to be a chronic bursitis with pressure injury to the skin overlying the olecranon likely due to limited cushion on the armchair of his wheelchair.  Does note that fluid returned and with lack of infection, we can try cortisone injection instead of the Toradol injection he had previously.  This was administered after aspiration of about 10 cc of sanguinous fluid.  Tolerated procedure well without complication.  Care was taken to avoid any needle tip near the ulnar nerve on the medial aspect of the elbow.  We applied a doughnut to the area circumferentially around the wound and will have patient follow-up with Dr. Lajoyce Corners or Barnie Del for wound evaluation in about 2 to 3 weeks. Procedure, treatment alternatives, risks and benefits explained, specific risks discussed. Consent was given by the patient. Immediately prior to procedure a time out was called to verify the correct patient, procedure, equipment, support staff and site/side marked as required. Patient was prepped and draped in the usual sterile fashion.

## 2023-04-06 NOTE — Telephone Encounter (Signed)
SW pt, he is scheduled 04/17/23 w/ Denny Peon.

## 2023-04-17 ENCOUNTER — Ambulatory Visit: Payer: Medicare HMO | Admitting: Family

## 2023-04-22 ENCOUNTER — Encounter: Payer: Self-pay | Admitting: Family

## 2023-04-22 ENCOUNTER — Ambulatory Visit (INDEPENDENT_AMBULATORY_CARE_PROVIDER_SITE_OTHER): Payer: Medicare HMO | Admitting: Family

## 2023-04-22 DIAGNOSIS — M7021 Olecranon bursitis, right elbow: Secondary | ICD-10-CM

## 2023-04-22 NOTE — Progress Notes (Signed)
Office Visit Note   Patient: Andrew Sullivan           Date of Birth: 1971/03/04           MRN: [id number] Visit Date: 04/22/2023              Requested by: Vonna Drafts, MD [address] [place],  [place] [address] PCP: Vonna Drafts, MD  Chief Complaint  Patient presents with   Right Elbow - Wound Check      HPI: The patient is a 53 year old gentleman seen in follow-up for olecranon bursitis on the right.  Has been using felt pressure relieving doughnuts Ace wrapped in place to the elbow he is status post aspiration with cortisone injections of January 24.  He has done quite well since that time he had does have pain with direct pressure but overall feels well has not had any drainage  He is in a motorized chair he reports he is currently working with his primary care on getting set up with a motorized chair At this point his current chair is 53 years old.  He needs an armrest for his current care for the right upper extremity  Assessment & Plan: Visit Diagnoses: No diagnosis found.  Plan: New felt pressure relieving donuts provided today.  Encouraged him to follow-up on getting a new armrest for the right upper extremity.  He will follow-up as needed.  Follow-Up Instructions: No follow-ups on file.   Left Elbow Exam   Other  Erythema: absent Scars: present Pulse: present      Patient is alert, oriented, no adenopathy, well-dressed, normal affect, normal respiratory effort. On examination of the right elbow there is some hyperkeratotic tissue over the olecranon with out fissure no skin breakdown no drainage today no area of fluctuance no edema. Imaging: No results found. No images are attached to the encounter.  Labs: Lab Results  Component Value Date   ESRSEDRATE 14 02/23/2023   CRP 1.1 (H) 02/23/2023   REPTSTATUS 05/13/2019 FINAL 05/13/2019   CULT (A) 05/13/2019    <10,000 COLONIES/mL INSIGNIFICANT GROWTH Performed at [facility], [address], [place], [place] [address]    Leanor Kail PNEUMONIAE 08/15/2011     Lab Results  Component Value Date   ALBUMIN 3.9 10/22/2022   ALBUMIN 4.2 02/06/2022   ALBUMIN 4.1 08/07/2021    No results found for: "MG" No results found for: "VD25OH"  No results found for: "PREALBUMIN"    Latest Ref Rng & Units 02/23/2023    3:34 PM 10/22/2022    8:18 AM 02/06/2022   10:40 AM  CBC EXTENDED  WBC 4.0 - 10.5 K/uL 3.8  3.8  4.0   RBC 4.22 - 5.81 MIL/uL 4.61  4.53  4.40   Hemoglobin 13.0 - 17.0 g/dL 82.9  56.2  13.0   HCT 39.0 - 52.0 % 44.3  42.1  41.4   Platelets 150 - 400 K/uL 210  183  179   NEUT# 1.7 - 7.7 K/uL  2.5  2.6   Lymph# 0.7 - 4.0 K/uL  0.8  0.9      There is no height or weight on file to calculate BMI.  Orders:  No orders of the defined types were placed in this encounter.  No orders of the defined types were placed in this encounter.    Procedures: No procedures performed  Clinical Data: No additional findings.  ROS:  All other systems negative, except as noted in  the HPI. Review of Systems  Objective: Vital Signs: There were no vitals taken for this visit.  Specialty Comments:  No specialty comments available.  PMFS History: Patient Active Problem List   Diagnosis Date Noted   Olecranon bursitis of right elbow 02/27/2023   Inflamed external hemorrhoid 01/04/2021   Abnormal vision screen 09/25/2020   Benign neoplasm of transverse colon    Anal polyp    Iron deficiency anemia 06/07/2020   DVT (deep venous thrombosis) (HCC) 10/19/2018   Erectile dysfunction    Spastic tetraplegia (HCC) 12/06/2013   History of recurrent UTIs 05/19/2010   Functional quadriplegia (HCC) 05/10/2009   Past Medical History:  Diagnosis Date   Anemia    DVT (deep venous thrombosis) (HCC)    Erectile dysfunction    Gross hematuria    History of recurrent UTIs    History of spinal cord injury    73 (age 81 fell from ladder) w/ cervical spine fx  s/p  c4 --  c6  fusion--  residual paraplegic bilateral legs (great below T8 uses w/c and can transfer self   Paraparesis of both lower limbs (HCC)    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 52 in 38   Spastic tetraplegia (HCC)     Family History  Problem Relation Age of Onset   Arthritis Mother    Hypertension Mother    Colon cancer Neg Hx    Esophageal cancer Neg Hx    Pancreatic cancer Neg Hx    Stomach cancer Neg Hx    Liver disease Neg Hx     Past Surgical History:  Procedure Laterality Date   BIOPSY  06/26/2020   Procedure: BIOPSY;  Surgeon: Beverley Fiedler, MD;  Location: [facility];  Service: Gastroenterology;;  EGD and COLON   CERVICAL FUSION  1988   C4 -- C6 w/ spinal cord injury   COLONOSCOPY WITH PROPOFOL N/A 06/26/2020   Procedure: COLONOSCOPY WITH PROPOFOL;  Surgeon: Beverley Fiedler, MD;  Location: [facility];  Service: Gastroenterology;  Laterality: N/A;   CYSTOSCOPY WITH RETROGRADE PYELOGRAM, URETEROSCOPY AND STENT PLACEMENT Left 12/27/2013   Procedure: CYSTOSCOPY WITH RETROGRADE PYELOGRAM, URETEROSCOPY AND STENT PLACEMENT;  Surgeon: Martina Sinner, MD;  Location: [facility];  Service: Urology;  Laterality: Left;   CYSTOSCOPY/RETROGRADE/URETEROSCOPY Left 01/09/2014   Procedure: CYSTOSCOPY/RETROGRADE/URETEROSCOPY, URETERAL BIOPSY AND STENT EXCHANGE;  Surgeon: Sebastian Ache, MD;  Location: [facility];  Service: Urology;  Laterality: Left;   ESOPHAGOGASTRODUODENOSCOPY (EGD) WITH PROPOFOL N/A 06/26/2020   Procedure: ESOPHAGOGASTRODUODENOSCOPY (EGD) WITH PROPOFOL;  Surgeon: Beverley Fiedler, MD;  Location: [facility];  Service: Gastroenterology;  Laterality: N/A;   HIP ARTHROPLASTY  08/12/2011   Procedure: ARTHROPLASTY BIPOLAR HIP;  Surgeon: Shelda Pal, MD;  Location: [facility];  Service: Orthopedics;  Laterality: Left;  Hemi-arthroplasty   POLYPECTOMY  06/26/2020   Procedure: POLYPECTOMY;  Surgeon: Beverley Fiedler, MD;  Location: [facility];  Service: Gastroenterology;;    Social History   Occupational History   Occupation: Disability  Tobacco Use   Smoking status: Former    Current packs/day: 0.00    Average packs/day: 0.3 packs/day for 0.5 years (0.1 ttl pk-yrs)    Types: Cigarettes    Start date: 06/28/1986    Quit date: 12/27/1986    Years since quitting: 36.3    Passive exposure: Past   Smokeless tobacco: Never  Vaping Use   Vaping status: Never Used  Substance and Sexual Activity   Alcohol use: No  Comment: hx of use 1980s   Drug use: No   Sexual activity: Not Currently

## 2023-04-23 ENCOUNTER — Inpatient Hospital Stay: Payer: Medicare HMO | Admitting: Hematology and Oncology

## 2023-04-23 ENCOUNTER — Inpatient Hospital Stay: Payer: Medicare HMO | Attending: Hematology and Oncology

## 2023-04-23 NOTE — Progress Notes (Signed)
No show

## 2023-06-10 ENCOUNTER — Ambulatory Visit: Admitting: Surgical

## 2023-06-14 ENCOUNTER — Other Ambulatory Visit: Payer: Self-pay | Admitting: Family Medicine

## 2023-06-14 ENCOUNTER — Other Ambulatory Visit: Payer: Self-pay | Admitting: Hematology and Oncology

## 2023-06-14 DIAGNOSIS — G8250 Quadriplegia, unspecified: Secondary | ICD-10-CM

## 2023-06-17 ENCOUNTER — Ambulatory Visit (INDEPENDENT_AMBULATORY_CARE_PROVIDER_SITE_OTHER): Admitting: Surgical

## 2023-06-17 DIAGNOSIS — M7021 Olecranon bursitis, right elbow: Secondary | ICD-10-CM

## 2023-06-18 ENCOUNTER — Encounter: Payer: Self-pay | Admitting: Surgical

## 2023-06-18 MED ORDER — LIDOCAINE HCL 1 % IJ SOLN
3.0000 mL | INTRAMUSCULAR | Status: AC | PRN
  Administered 2023-06-17: 3 mL

## 2023-06-18 MED ORDER — BUPIVACAINE HCL 0.25 % IJ SOLN
1.0000 mL | INTRAMUSCULAR | Status: AC | PRN
  Administered 2023-06-17: 1 mL via INTRA_ARTICULAR

## 2023-06-18 MED ORDER — METHYLPREDNISOLONE ACETATE 40 MG/ML IJ SUSP
20.0000 mg | INTRAMUSCULAR | Status: AC | PRN
  Administered 2023-06-17: 20 mg via INTRA_ARTICULAR

## 2023-06-18 NOTE — Progress Notes (Signed)
 Follow-up Office Visit Note   Patient: Andrew Sullivan           Date of Birth: [birth year]           MRN: [id number] Visit Date: 06/17/2023 Requested by: Vonna Drafts, MD [address] [place],  [place] [address] PCP: Vonna Drafts, MD  Subjective: Chief Complaint  Patient presents with   Right Elbow - Pain    HPI: Andrew Sullivan is a 53 y.o. male who returns to the office for follow-up visit.    Plan at last visit was: Aspiration/cortisone injection of the olecranon bursa of the right arm  Since then, patient notes he had good relief of swelling and pain but this has now returned in the last 1 to 2 weeks.  He denies any fevers or chills.  Still ambulating with the same wheelchair which does not really have much cushioning on the right armrest where he controls the wheelchair.  Working on getting a new wheelchair with more cushioning and padding.              ROS: All systems reviewed are negative as they relate to the chief complaint within the history of present illness.  Patient denies fevers or chills.  Assessment & Plan: Visit Diagnoses:  1. Olecranon bursitis of right elbow     Plan: Andrew Sullivan is a 53 y.o. male who returns to the office for follow-up visit.  Plan from last visit was noted above in HPI.  They now return with right elbow olecranon bursitis.  We have previously sent off fluid from this bursa for fluid analysis to make sure it is not infectious and this was found to not be septic bursitis.  Cortisone injection was administered about 3 months ago with good relief of his swelling and pain.  He would like to repeat this today.  I think we can do this 1 more time but if the swelling returns, we would need to consider either having him live with it with conservative measures such as compression or consider surgical intervention with open bursectomy in the operating room.  He understands and agrees with this plan.  19 cc of serosanguineous fluid was aspirated  from the right olecranon bursa.  Cortisone injection administered and patient tolerated procedure well without complication.  Care was taken to avoid any needle placement near the ulnar nerve.  No evidence of purulence in the aspirate.  Patient will make an effort to obtain paperwork that I can fill out for helping him get a new wheelchair and he will also try and get a compression sleeve with a padded elbow.    Follow-Up Instructions: No follow-ups on file.   Orders:  No orders of the defined types were placed in this encounter.  No orders of the defined types were placed in this encounter.     Procedures: Medium Joint Inj: R olecranon bursa on 06/17/2023 3:24 PM Indications: pain and joint swelling Details: 22 G 1.5 in needle, lateral approach Medications: 3 mL lidocaine 1 %; 1 mL bupivacaine 0.25 %; 20 mg methylPREDNISolone acetate 40 MG/ML Aspirate: 19 mL blood-tinged and serous Outcome: tolerated well, no immediate complications Procedure, treatment alternatives, risks and benefits explained, specific risks discussed. Consent was given by the patient. Immediately prior to procedure a time out was called to verify the correct patient, procedure, equipment, support staff and site/side marked as required. Patient was prepped and draped in the usual sterile fashion.  Clinical Data: No additional findings.  Objective: Vital Signs: There were no vitals taken for this visit.  Physical Exam:  Constitutional: Patient appears well-developed HEENT:  Head: Normocephalic Eyes:EOM are normal Neck: Normal range of motion Cardiovascular: Normal rate Pulmonary/chest: Effort normal Neurologic: Patient is alert Skin: Skin is warm Psychiatric: Patient has normal mood and affect  Ortho Exam: Ortho exam demonstrates right elbow with full active and passive range of motion.  There is swelling noted over the olecranon bursa with no significant warmth.  There is no draining sinus.  There is  some increased callus formation of the skin overlying the olecranon compared with last exam.  Negative Tinel sign without reproduction of radiating pain or numbness/tingling.  Really minimal tenderness and no significant warmth.  Specialty Comments:  No specialty comments available.  Imaging: No results found.   PMFS History: Patient Active Problem List   Diagnosis Date Noted   Olecranon bursitis of right elbow 02/27/2023   Inflamed external hemorrhoid 01/04/2021   Abnormal vision screen 09/25/2020   Benign neoplasm of transverse colon    Anal polyp    Iron deficiency anemia 06/07/2020   DVT (deep venous thrombosis) (HCC) 10/19/2018   Erectile dysfunction    Spastic tetraplegia (HCC) 12/06/2013   History of recurrent UTIs 05/19/2010   Functional quadriplegia (HCC) 05/10/2009   Past Medical History:  Diagnosis Date   Anemia    DVT (deep venous thrombosis) (HCC)    Erectile dysfunction    Gross hematuria    History of recurrent UTIs    History of spinal cord injury    45 (age 1 fell from ladder) w/ cervical spine fx  s/p  c4 -- c6  fusion--  residual paraplegic bilateral legs (great below T8 uses w/c and can transfer self   Paraparesis of both lower limbs (HCC)    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 57 in 57   Spastic tetraplegia (HCC)     Family History  Problem Relation Age of Onset   Arthritis Mother    Hypertension Mother    Colon cancer Neg Hx    Esophageal cancer Neg Hx    Pancreatic cancer Neg Hx    Stomach cancer Neg Hx    Liver disease Neg Hx     Past Surgical History:  Procedure Laterality Date   BIOPSY  06/26/2020   Procedure: BIOPSY;  Surgeon: Beverley Fiedler, MD;  Location: [facility];  Service: Gastroenterology;;  EGD and COLON   CERVICAL FUSION  1988   C4 -- C6 w/ spinal cord injury   COLONOSCOPY WITH PROPOFOL N/A 06/26/2020   Procedure: COLONOSCOPY WITH PROPOFOL;  Surgeon: Beverley Fiedler, MD;  Location: [facility];  Service:  Gastroenterology;  Laterality: N/A;   CYSTOSCOPY WITH RETROGRADE PYELOGRAM, URETEROSCOPY AND STENT PLACEMENT Left 12/27/2013   Procedure: CYSTOSCOPY WITH RETROGRADE PYELOGRAM, URETEROSCOPY AND STENT PLACEMENT;  Surgeon: Martina Sinner, MD;  Location: [facility];  Service: Urology;  Laterality: Left;   CYSTOSCOPY/RETROGRADE/URETEROSCOPY Left 01/09/2014   Procedure: CYSTOSCOPY/RETROGRADE/URETEROSCOPY, URETERAL BIOPSY AND STENT EXCHANGE;  Surgeon: Sebastian Ache, MD;  Location: [facility];  Service: Urology;  Laterality: Left;   ESOPHAGOGASTRODUODENOSCOPY (EGD) WITH PROPOFOL N/A 06/26/2020   Procedure: ESOPHAGOGASTRODUODENOSCOPY (EGD) WITH PROPOFOL;  Surgeon: Beverley Fiedler, MD;  Location: [facility];  Service: Gastroenterology;  Laterality: N/A;   HIP ARTHROPLASTY  08/12/2011   Procedure: ARTHROPLASTY BIPOLAR HIP;  Surgeon: Shelda Pal, MD;  Location: [facility];  Service: Orthopedics;  Laterality: Left;  Hemi-arthroplasty  POLYPECTOMY  06/26/2020   Procedure: POLYPECTOMY;  Surgeon: Beverley Fiedler, MD;  Location: [facility];  Service: Gastroenterology;;   Social History   Occupational History   Occupation: Disability  Tobacco Use   Smoking status: Former    Current packs/day: 0.00    Average packs/day: 0.3 packs/day for 0.5 years (0.1 ttl pk-yrs)    Types: Cigarettes    Start date: 06/28/1986    Quit date: 12/27/1986    Years since quitting: 36.4    Passive exposure: Past   Smokeless tobacco: Never  Vaping Use   Vaping status: Never Used  Substance and Sexual Activity   Alcohol use: No    Comment: hx of use 1980s   Drug use: No   Sexual activity: Not Currently

## 2023-07-08 DIAGNOSIS — R532 Functional quadriplegia: Secondary | ICD-10-CM | POA: Diagnosis not present

## 2023-07-08 DIAGNOSIS — M62830 Muscle spasm of back: Secondary | ICD-10-CM | POA: Diagnosis not present

## 2023-07-08 DIAGNOSIS — R609 Edema, unspecified: Secondary | ICD-10-CM | POA: Diagnosis not present

## 2023-07-08 DIAGNOSIS — G8250 Quadriplegia, unspecified: Secondary | ICD-10-CM | POA: Diagnosis not present

## 2023-07-09 ENCOUNTER — Ambulatory Visit: Admitting: Family Medicine

## 2023-07-17 ENCOUNTER — Ambulatory Visit (INDEPENDENT_AMBULATORY_CARE_PROVIDER_SITE_OTHER): Admitting: Student

## 2023-07-17 ENCOUNTER — Ambulatory Visit: Admitting: Family Medicine

## 2023-07-17 DIAGNOSIS — G8250 Quadriplegia, unspecified: Secondary | ICD-10-CM

## 2023-07-17 MED ORDER — BACLOFEN 20 MG PO TABS
20.0000 mg | ORAL_TABLET | Freq: Four times a day (QID) | ORAL | 3 refills | Status: DC
Start: 2023-07-17 — End: 2023-11-24

## 2023-07-17 NOTE — Progress Notes (Signed)
  SUBJECTIVE:   CHIEF COMPLAINT / HPI:   Andrew Sullivan is a 53 year old male who presents for medication refills for baclofen .  He requires refills for baclofen , taken four times daily since 1998 for chronic muscle spasms. The pharmacy dispenses only a one-month supply, necessitating frequent clinic visits to secure a three to four-month supply.  He does not have other concerns today  PERTINENT  PMH / PSH: DVT on Xarelto , ED, spastic tetraplegia  OBJECTIVE:  BP 110/72   Pulse (!) 59   SpO2 99%  General: Well-appearing, NAD, in motorized wheelchair Psych: Normal mood and affect  ASSESSMENT/PLAN:   Assessment & Plan Spastic tetraplegia (HCC) On baclofen  chronically, unable to safely adequately perform mobility related ADLs therefore requires ongoing use of power wheelchair.  Refill baclofen  for 1 month supply x 3 refills.  He should not need to come in for this or separate clinic visits given chronic need of medication. Return if symptoms worsen or fail to improve. Veronia Goon, DO 07/17/2023, 11:10 AM PGY-3, [facility]

## 2023-07-17 NOTE — Patient Instructions (Addendum)
 It was great to see you today! Thank you for choosing [facility] for your primary care.  Today we addressed: I have refilled your baclofen , provided 3 refills for each month.  I have documented this is a chronic medication.  I suspect your PCP was on vacation and that is why only a single month was submitted as a refill.  If you haven't already, sign up for My Chart to have easy access to your labs results, and communication with your primary care physician.  Return if symptoms worsen or fail to improve. Please arrive 15 minutes before your appointment to ensure smooth check in process.  We appreciate your efforts in making this happen.  Thank you for allowing me to participate in your care, Veronia Goon, DO 07/17/2023, 11:10 AM PGY-3, [facility]

## 2023-07-17 NOTE — Assessment & Plan Note (Signed)
 On baclofen  chronically, unable to safely adequately perform mobility related ADLs therefore requires ongoing use of power wheelchair.  Refill baclofen  for 1 month supply x 3 refills.  He should not need to come in for this or separate clinic visits given chronic need of medication.

## 2023-07-27 ENCOUNTER — Ambulatory Visit

## 2023-07-27 VITALS — Wt 176.0 lb

## 2023-07-27 DIAGNOSIS — Z0000 Encounter for general adult medical examination without abnormal findings: Secondary | ICD-10-CM | POA: Diagnosis not present

## 2023-07-27 NOTE — Patient Instructions (Addendum)
 Andrew Sullivan , Thank you for taking time out of your busy schedule to complete your Annual Wellness Visit with me. I enjoyed our conversation and look forward to speaking with you again next year. I, as well as your care team,  appreciate your ongoing commitment to your health goals. Please review the following plan we discussed and let me know if I can assist you in the future. Your Game plan/ To Do List    Referrals: If you haven't heard from the office you've been referred to, please reach out to them at the phone provided.   Follow up Visits: Next Medicare AWV with our clinical staff: 08/04/2024 at 2:50p PHONE VISIT with Nurse   Have you seen your provider in the last 6 months (3 months if uncontrolled diabetes)? Yes   Clinician Recommendations:  Aim for 30 minutes of exercise or brisk walking, 6-8 glasses of water , and 5 servings of fruits and vegetables each day.       This is a list of the screening recommended for you and due dates:  Health Maintenance  Topic Date Due   Zoster (Shingles) Vaccine (1 of 2) Never done   COVID-19 Vaccine (1 - 2024-25 season) 08/02/2023*   DTaP/Tdap/Td vaccine (2 - Tdap) 07/16/2024*   Flu Shot  10/09/2023   Medicare Annual Wellness Visit  07/26/2024   Colon Cancer Screening  06/27/2027   Hepatitis C Screening  Completed   HIV Screening  Completed   HPV Vaccine  Aged Out   Meningitis B Vaccine  Aged Out  *Topic was postponed. The date shown is not the original due date.    Advanced directives: (Declined) Advance directive discussed with you today. Even though you declined this today, please call our office should you change your mind, and we can give you the proper paperwork for you to fill out. Advance Care Planning is important because it:  [x]  Makes sure you receive the medical care that is consistent with your values, goals, and preferences  [x]  It provides guidance to your family and loved ones and reduces their decisional burden about whether  or not they are making the right decisions based on your wishes.  Follow the link provided in your after visit summary or read over the paperwork we have mailed to you to help you started getting your Advance Directives in place. If you need assistance in completing these, please reach out to us  so that we can help you!  See attachments for Preventive Care and Fall Prevention Tips.

## 2023-07-27 NOTE — Progress Notes (Signed)
 Because this visit was a virtual/telehealth visit,  certain criteria was not obtained, such a blood pressure, CBG if applicable, and timed get up and go. Any medications not marked as "taking" were not mentioned during the medication reconciliation part of the visit. Any vitals not documented were not able to be obtained due to this being a telehealth visit or patient was unable to self-report a recent blood pressure reading due to a lack of equipment at home via telehealth. Vitals that have been documented are verbally provided by the patient.   Subjective:   Andrew Sullivan is a 53 y.o. who presents for a Medicare Wellness preventive visit.  As a reminder, Annual Wellness Visits don't include a physical exam, and some assessments may be limited, especially if this visit is performed virtually. We may recommend an in-person follow-up visit with your provider if needed.  Visit Complete: Virtual I connected with  Andrew Sullivan on 07/27/23 by a audio enabled telemedicine application and verified that I am speaking with the correct person using two identifiers.  Patient Location: Home  Provider Location: Office/Clinic  I discussed the limitations of evaluation and management by telemedicine. The patient expressed understanding and agreed to proceed.  Vital Signs: Because this visit was a virtual/telehealth visit, some criteria may be missing or patient reported. Any vitals not documented were not able to be obtained and vitals that have been documented are patient reported.  VideoDeclined- This patient declined Librarian, academic. Therefore the visit was completed with audio only.  Persons Participating in Visit: Patient.  AWV Questionnaire: No: Patient Medicare AWV questionnaire was not completed prior to this visit.  Cardiac Risk Factors include: advanced age (>31men, >36 women);family history of premature cardiovascular disease;hypertension;male  gender     Objective:     Today's Vitals   07/27/23 1438  Weight: 176 lb (79.8 kg)  PainSc: 0-No pain   Body mass index is 22 kg/m.     07/17/2023   10:47 AM 02/24/2023    2:34 PM 06/09/2022    3:21 PM 06/02/2022    3:39 PM 12/04/2021    9:44 AM 12/03/2021    1:54 PM 09/11/2021   11:27 AM  Advanced Directives  Does Patient Have a Medical Advance Directive? No No No No No No No  Would patient like information on creating a medical advance directive? No - Patient declined No - Patient declined No - Patient declined No - Patient declined No - Patient declined No - Patient declined No - Patient declined    Current Medications (verified) Outpatient Encounter Medications as of 07/27/2023  Medication Sig   baclofen  (LIORESAL ) 20 MG tablet Take 1 tablet (20 mg total) by mouth 4 (four) times daily.   Ferrous Sulfate  (IRON ) 325 (65 Fe) MG TABS Take 1 tablet (325 mg total) by mouth every other day.   naproxen  (NAPROSYN ) 500 MG tablet Take 1 tablet (500 mg total) by mouth every 6 (six) hours as needed.   tadalafil (CIALIS) 5 MG tablet Take 5 mg by mouth daily as needed for erectile dysfunction.   XARELTO  10 MG TABS tablet Take 1 tablet by mouth once daily   No facility-administered encounter medications on file as of 07/27/2023.    Allergies (verified) Penicillins   History: Past Medical History:  Diagnosis Date   Anemia    DVT (deep venous thrombosis) (HCC)    Erectile dysfunction    Gross hematuria    History of recurrent UTIs  History of spinal cord injury    89 (age 66 fell from ladder) w/ cervical spine fx  s/p  c4 -- c6  fusion--  residual paraplegic bilateral legs (great below T8 uses w/c and can transfer self   Paraparesis of both lower limbs (HCC)    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 89 in 61   Spastic tetraplegia [facility])    Past Surgical History:  Procedure Laterality Date   BIOPSY  06/26/2020   Procedure: BIOPSY;  Surgeon: Nannette Babe, MD;  Location: [facility]  ENDOSCOPY;  Service: Gastroenterology;;  EGD and COLON   CERVICAL FUSION  1988   C4 -- C6 w/ spinal cord injury   COLONOSCOPY WITH PROPOFOL  N/A 06/26/2020   Procedure: COLONOSCOPY WITH PROPOFOL ;  Surgeon: Nannette Babe, MD;  Location: [facility];  Service: Gastroenterology;  Laterality: N/A;   CYSTOSCOPY WITH RETROGRADE PYELOGRAM, URETEROSCOPY AND STENT PLACEMENT Left 12/27/2013   Procedure: CYSTOSCOPY WITH RETROGRADE PYELOGRAM, URETEROSCOPY AND STENT PLACEMENT;  Surgeon: Devorah Fonder, MD;  Location: [facility];  Service: Urology;  Laterality: Left;   CYSTOSCOPY/RETROGRADE/URETEROSCOPY Left 01/09/2014   Procedure: CYSTOSCOPY/RETROGRADE/URETEROSCOPY, URETERAL BIOPSY AND STENT EXCHANGE;  Surgeon: Osborn Blaze, MD;  Location: [facility]S;  Service: Urology;  Laterality: Left;   ESOPHAGOGASTRODUODENOSCOPY (EGD) WITH PROPOFOL  N/A 06/26/2020   Procedure: ESOPHAGOGASTRODUODENOSCOPY (EGD) WITH PROPOFOL ;  Surgeon: Nannette Babe, MD;  Location: [facility];  Service: Gastroenterology;  Laterality: N/A;   HIP ARTHROPLASTY  08/12/2011   Procedure: ARTHROPLASTY BIPOLAR HIP;  Surgeon: Bevin Bucks, MD;  Location: [facility];  Service: Orthopedics;  Laterality: Left;  Hemi-arthroplasty   POLYPECTOMY  06/26/2020   Procedure: POLYPECTOMY;  Surgeon: Nannette Babe, MD;  Location: [facility];  Service: Gastroenterology;;   Family History  Problem Relation Age of Onset   Arthritis Mother    Hypertension Mother    Colon cancer Neg Hx    Esophageal cancer Neg Hx    Pancreatic cancer Neg Hx    Stomach cancer Neg Hx    Liver disease Neg Hx    Social History   Socioeconomic History   Marital status: Single    Spouse name: Not on file   Number of children: 0   Years of education: 27   Highest education level: 12th grade  Occupational History   Occupation: Disability  Tobacco Use   Smoking status: Former    Current packs/day: 0.00    Average packs/day: 0.3 packs/day for 0.5 years (0.1 ttl  pk-yrs)    Types: Cigarettes    Start date: 06/28/1986    Quit date: 12/27/1986    Years since quitting: 36.6    Passive exposure: Past   Smokeless tobacco: Never  Vaping Use   Vaping status: Never Used  Substance and Sexual Activity   Alcohol use: No    Comment: hx of use 1980s   Drug use: No   Sexual activity: Not Currently  Other Topics Concern   Not on file  Social History Narrative   Patient lives with his mother and step father.   Is on disability secondary to neck fracture.    Stays active going to gym and church. Uses [employer] for transportation or local bus.   Enjoys going to the movies and playing cards.    Social Drivers of Corporate investment banker Strain: Low Risk  (07/27/2023)   Overall Financial Resource Strain (CARDIA)    Difficulty of Paying Living Expenses: Not hard at all  Food Insecurity: No  Food Insecurity (07/27/2023)   Hunger Vital Sign    Worried About Running Out of Food in the Last Year: Never true    Ran Out of Food in the Last Year: Never true  Transportation Needs: No Transportation Needs (07/27/2023)   PRAPARE - Administrator, Civil Service (Medical): No    Lack of Transportation (Non-Medical): No  Physical Activity: Sufficiently Active (07/27/2023)   Exercise Vital Sign    Days of Exercise per Week: 3 days    Minutes of Exercise per Session: 90 min  Stress: No Stress Concern Present (07/27/2023)   [employer] of Occupational Health - Occupational Stress Questionnaire    Feeling of Stress : Not at all  Social Connections: Moderately Integrated (07/27/2023)   Social Connection and Isolation Panel [NHANES]    Frequency of Communication with Friends and Family: More than three times a week    Frequency of Social Gatherings with Friends and Family: Twice a week    Attends Religious Services: More than 4 times per year    Active Member of [employer] or Organizations: Yes    Attends Engineer, structural: More than 4 times per year     Marital Status: Never married    Tobacco Counseling Counseling given: Not Answered    Clinical Intake:  Pre-visit preparation completed: Yes  Pain : No/denies pain Pain Score: 0-No pain     BMI - recorded: 22 Nutritional Status: BMI of 19-24  Normal Nutritional Risks: None Diabetes: No  No results found for: "HGBA1C"   How often do you need to have someone help you when you read instructions, pamphlets, or other written materials from your doctor or pharmacy?: 1 - Never  Interpreter Needed?: No  Information entered by :: Dhara Schepp N. Damien Batty, LPN.   Activities of Daily Living     07/27/2023    2:42 PM  In your present state of health, do you have any difficulty performing the following activities:  Hearing? 0  Vision? 0  Difficulty concentrating or making decisions? 0  Walking or climbing stairs? 1  Dressing or bathing? 0  Doing errands, shopping? 0  Preparing Food and eating ? N  Using the Toilet? N  In the past six months, have you accidently leaked urine? N  Do you have problems with loss of bowel control? N  Managing your Medications? N  Managing your Finances? N  Housekeeping or managing your Housekeeping? N    Patient Care Team: Edison Gore, MD as PCP - General (Family Medicine) Secundino Dach Harvey Linen., MD as Consulting Physician (Urology) Maris Sickle, MD as Referring Physician (Ophthalmology) Pyrtle, Amber Bail, MD as Consulting Physician (Gastroenterology)  Indicate any recent Medical Services you may have received from other than [facility] providers in the past year (date may be approximate).     Assessment:    This is a routine wellness examination for Andrew Sullivan.  Hearing/Vision screen Hearing Screening - Comments:: Denies hearing difficulties.  Vision Screening - Comments:: Wears rx glasses - up to date with routine eye exams with [facility]    Goals Addressed             This Visit's Progress    Client understands the importance  of follow-up with providers by attending scheduled visits         Depression Screen     07/27/2023    2:39 PM 06/09/2022    3:21 PM 06/02/2022    3:33 PM 12/04/2021  9:44 AM 09/11/2021   11:27 AM 07/15/2021    1:49 PM 06/12/2021    3:23 PM  PHQ 2/9 Scores  PHQ - 2 Score 0 1 0 0 0 0 0  PHQ- 9 Score 0 2  2 1 1 2     Fall Risk     07/27/2023    2:42 PM 06/02/2022    3:33 PM 05/15/2021    2:28 PM 06/07/2020   11:07 AM 11/07/2019    3:51 PM  Fall Risk   Falls in the past year? 1 1 0 1 0  Number falls in past yr: 1 1 0 0 0  Injury with Fall? 0 0 0 1   Risk for fall due to : History of fall(s);Impaired balance/gait Impaired balance/gait;Impaired mobility Impaired mobility    Follow up Falls prevention discussed;Falls evaluation completed Falls evaluation completed Falls prevention discussed  Falls evaluation completed    MEDICARE RISK AT HOME:  Medicare Risk at Home Any stairs in or around the home?: No If so, are there any without handrails?: No Home free of loose throw rugs in walkways, pet beds, electrical cords, etc?: Yes Adequate lighting in your home to reduce risk of falls?: Yes Life alert?: No Use of a cane, walker or w/c?: Yes Grab bars in the bathroom?: Yes Shower chair or bench in shower?: Yes Elevated toilet seat or a handicapped toilet?: No  TIMED UP AND GO:  Was the test performed?  No  Cognitive Function: 6CIT completed    07/27/2023    2:43 PM 02/25/2018   11:17 AM  MMSE - Mini Mental State Exam  Not completed: Unable to complete   Orientation to time  5  Orientation to Place  5  Registration  3  Attention/ Calculation  5  Recall  3  Language- name 2 objects  2  Language- repeat  1  Language- follow 3 step command  3  Language- read & follow direction  1  Write a sentence  1  Copy design  1  Total score  30        07/27/2023    2:43 PM 05/15/2021    2:29 PM 02/25/2018   11:17 AM  6CIT Screen  What Year? 0 points 0 points 0 points  What month? 0  points 0 points 0 points  What time? 0 points 0 points 0 points  Count back from 20 0 points 0 points 0 points  Months in reverse 0 points 0 points 0 points  Repeat phrase 0 points 0 points 0 points  Total Score 0 points 0 points 0 points    Immunizations Immunization History  Administered Date(s) Administered   Td 02/07/2001    Screening Tests Health Maintenance  Topic Date Due   Zoster Vaccines- Shingrix (1 of 2) Never done   COVID-19 Vaccine (1 - 2024-25 season) 08/02/2023 (Originally 11/09/2022)   DTaP/Tdap/Td (2 - Tdap) 07/16/2024 (Originally 02/08/2011)   INFLUENZA VACCINE  10/09/2023   Medicare Annual Wellness (AWV)  07/26/2024   Colonoscopy  06/27/2027   Hepatitis C Screening  Completed   HIV Screening  Completed   HPV VACCINES  Aged Out   Meningococcal B Vaccine  Aged Out    Health Maintenance  Health Maintenance Due  Topic Date Due   Zoster Vaccines- Shingrix (1 of 2) Never done   Health Maintenance Items Addressed: Yes Patient is aware of current care gaps.  Patient refuse vaccines.  Additional Screening:  Vision Screening: Recommended annual ophthalmology  exams for early detection of glaucoma and other disorders of the eye.  Dental Screening: Recommended annual dental exams for proper oral hygiene  Community Resource Referral / Chronic Care Management: CRR required this visit?  No   CCM required this visit?  No   Plan:    I have personally reviewed and noted the following in the patient's chart:   Medical and social history Use of alcohol, tobacco or illicit drugs  Current medications and supplements including opioid prescriptions. Patient is not currently taking opioid prescriptions. Functional ability and status Nutritional status Physical activity Advanced directives List of other physicians Hospitalizations, surgeries, and ER visits in previous 12 months Vitals Screenings to include cognitive, depression, and falls Referrals and  appointments  In addition, I have reviewed and discussed with patient certain preventive protocols, quality metrics, and best practice recommendations. A written personalized care plan for preventive services as well as general preventive health recommendations were provided to patient.   Margette Sheldon, LPN   1/61/0960   After Visit Summary: (MyChart) Due to this being a telephonic visit, the after visit summary with patients personalized plan was offered to patient via MyChart   Notes: Patient is aware of current care gaps.  Patient refuse vaccines.

## 2023-08-06 ENCOUNTER — Telehealth: Payer: Self-pay

## 2023-08-06 NOTE — Telephone Encounter (Signed)
 [name] with [employer] calls nurse line in regards to wheel chair repair orders.   He reports these were faxed last week.   He reports the order is under [name].   This is in [name] box for review.

## 2023-08-10 NOTE — Telephone Encounter (Signed)
 Spoke with [employer].   Advised the wrong provider is listed on the form. He reports the name on the form must match signature.   Advised this is a residency and an attending will have to sign regardless.   He reports he will refax with PCP name and Otho Blitz.  We can shred the order form with [facility] listed as provider. (I have done this already.)

## 2023-08-13 NOTE — Telephone Encounter (Signed)
 Received another call from [employer] in regards to wheelchair repairs.  Please let me know if you have not received the document.   [name]

## 2023-08-17 ENCOUNTER — Other Ambulatory Visit: Payer: Self-pay | Admitting: Family Medicine

## 2023-08-17 DIAGNOSIS — D509 Iron deficiency anemia, unspecified: Secondary | ICD-10-CM

## 2023-09-15 ENCOUNTER — Other Ambulatory Visit: Payer: Self-pay | Admitting: Hematology and Oncology

## 2023-09-16 NOTE — Telephone Encounter (Signed)
 Refilled per Dr. Lafonda order. Dr. Federico requested patient be scheduled for follow up visit in 4 weeks. Schedule message sent.

## 2023-10-05 DIAGNOSIS — G8250 Quadriplegia, unspecified: Secondary | ICD-10-CM | POA: Diagnosis not present

## 2023-10-05 DIAGNOSIS — R532 Functional quadriplegia: Secondary | ICD-10-CM | POA: Diagnosis not present

## 2023-10-05 DIAGNOSIS — R609 Edema, unspecified: Secondary | ICD-10-CM | POA: Diagnosis not present

## 2023-10-05 DIAGNOSIS — M62830 Muscle spasm of back: Secondary | ICD-10-CM | POA: Diagnosis not present

## 2023-10-15 DIAGNOSIS — R310 Gross hematuria: Secondary | ICD-10-CM | POA: Diagnosis not present

## 2023-10-15 DIAGNOSIS — N319 Neuromuscular dysfunction of bladder, unspecified: Secondary | ICD-10-CM | POA: Diagnosis not present

## 2023-10-15 DIAGNOSIS — Z125 Encounter for screening for malignant neoplasm of prostate: Secondary | ICD-10-CM | POA: Diagnosis not present

## 2023-10-16 ENCOUNTER — Other Ambulatory Visit: Payer: Self-pay | Admitting: Hematology and Oncology

## 2023-10-16 ENCOUNTER — Inpatient Hospital Stay: Attending: Hematology and Oncology

## 2023-10-16 ENCOUNTER — Inpatient Hospital Stay: Admitting: Hematology and Oncology

## 2023-10-16 DIAGNOSIS — Z86718 Personal history of other venous thrombosis and embolism: Secondary | ICD-10-CM

## 2023-10-16 NOTE — Progress Notes (Deleted)
 [facility] Telephone:([phone]) [phone]   [phone]  PROGRESS NOTE  Patient Care Team: Romelle Booty, MD as PCP - General (Family Medicine) Alvaro Ricardo KATHEE Raddle., MD as Consulting Physician (Urology) Octavia Charleston, MD as Referring Physician (Ophthalmology) Albertus Gordy HERO, MD as Consulting Physician (Gastroenterology)  Hematological/Oncological History # Unprovoked Right Lower Extremity DVT  10/14/2018: Lower Extremity US  showed acute deep vein thrombosis involving the right femoral vein, right popliteal vein, right posterior tibial veins, and right peroneal veins. 10/22/2020: Lower Extremity US  showed an acute deep vein thrombosis involving the right posterior tibial veins, and right gastrocnemius veins 08/07/2021: establish care with Dr. Federico   Interval History:  Andrew Sullivan 53 y.o. male with medical history significant for lower extremity DVT who presents for a follow up visit. The patient's last visit was on 02/06/2022 at which time he established care. In the interim since the last visit he has continued on Xarelto  maintenance therapy.   On exam today Andrew Sullivan reports he is tolerating Xarelto  therapy without any side effects. He denies any bruising or bleeding episodes. He denies any cost burden with the medication. He does have lower extremity swelling, right greater than left. He reports the swelling improves with elevation and he wears compression stockings. He is otherwise feeling well without any changes to his appetite or energy levels. He denies fevers, chills, sweats, shortness of breath, chest pain or cough.   A full 10 point ROS was otherwise negative.  MEDICAL HISTORY:  Past Medical History:  Diagnosis Date   Anemia    DVT (deep venous thrombosis) (HCC)    Erectile dysfunction    Gross hematuria    History of recurrent UTIs    History of spinal cord injury    7 (age 3 fell from ladder) w/ cervical spine fx  s/p  c4 -- c6  fusion--   residual paraplegic bilateral legs (great below T8 uses w/c and can transfer self   Paraparesis of both lower limbs (HCC)    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 48 in 44   Spastic tetraplegia (HCC)     SURGICAL HISTORY: Past Surgical History:  Procedure Laterality Date   BIOPSY  06/26/2020   Procedure: BIOPSY;  Surgeon: Albertus Gordy HERO, MD;  Location: [facility];  Service: Gastroenterology;;  EGD and COLON   CERVICAL FUSION  1988   C4 -- C6 w/ spinal cord injury   COLONOSCOPY WITH PROPOFOL  N/A 06/26/2020   Procedure: COLONOSCOPY WITH PROPOFOL ;  Surgeon: Albertus Gordy HERO, MD;  Location: [facility];  Service: Gastroenterology;  Laterality: N/A;   CYSTOSCOPY WITH RETROGRADE PYELOGRAM, URETEROSCOPY AND STENT PLACEMENT Left 12/27/2013   Procedure: CYSTOSCOPY WITH RETROGRADE PYELOGRAM, URETEROSCOPY AND STENT PLACEMENT;  Surgeon: Glendia DELENA Elizabeth, MD;  Location: [facility] [place];  Service: Urology;  Laterality: Left;   CYSTOSCOPY/RETROGRADE/URETEROSCOPY Left 01/09/2014   Procedure: CYSTOSCOPY/RETROGRADE/URETEROSCOPY, URETERAL BIOPSY AND STENT EXCHANGE;  Surgeon: Ricardo Alvaro, MD;  Location: [facility];  Service: Urology;  Laterality: Left;   ESOPHAGOGASTRODUODENOSCOPY (EGD) WITH PROPOFOL  N/A 06/26/2020   Procedure: ESOPHAGOGASTRODUODENOSCOPY (EGD) WITH PROPOFOL ;  Surgeon: Albertus Gordy HERO, MD;  Location: [facility];  Service: Gastroenterology;  Laterality: N/A;   HIP ARTHROPLASTY  08/12/2011   Procedure: ARTHROPLASTY BIPOLAR HIP;  Surgeon: Donnice BIRCH Car, MD;  Location: [facility];  Service: Orthopedics;  Laterality: Left;  Hemi-arthroplasty   POLYPECTOMY  06/26/2020   Procedure: POLYPECTOMY;  Surgeon: Albertus Gordy HERO, MD;  Location: [facility];  Service: Gastroenterology;;  SOCIAL HISTORY: Social History   Socioeconomic History   Marital status: Single    Spouse name: Not on file   Number of children: 0   Years of education: 12   Highest education level: 12th grade  Occupational  History   Occupation: Disability  Tobacco Use   Smoking status: Former    Current packs/day: 0.00    Average packs/day: 0.3 packs/day for 0.5 years (0.1 ttl pk-yrs)    Types: Cigarettes    Start date: 06/28/1986    Quit date: 12/27/1986    Years since quitting: 36.8    Passive exposure: Past   Smokeless tobacco: Never  Vaping Use   Vaping status: Never Used  Substance and Sexual Activity   Alcohol use: No    Comment: hx of use 1980s   Drug use: No   Sexual activity: Not Currently  Other Topics Concern   Not on file  Social History Narrative   Patient lives with his mother and step father.   Is on disability secondary to neck fracture.    Stays active going to gym and church. Uses [employer] for transportation or local bus.   Enjoys going to the movies and playing cards.    Social Drivers of Corporate investment banker Strain: Low Risk  (07/27/2023)   Overall Financial Resource Strain (CARDIA)    Difficulty of Paying Living Expenses: Not hard at all  Food Insecurity: No Food Insecurity (07/27/2023)   Hunger Vital Sign    Worried About Running Out of Food in the Last Year: Never true    Ran Out of Food in the Last Year: Never true  Transportation Needs: No Transportation Needs (07/27/2023)   PRAPARE - Administrator, Civil Service (Medical): No    Lack of Transportation (Non-Medical): No  Physical Activity: Sufficiently Active (07/27/2023)   Exercise Vital Sign    Days of Exercise per Week: 3 days    Minutes of Exercise per Session: 90 min  Stress: No Stress Concern Present (07/27/2023)   [employer] of Occupational Health - Occupational Stress Questionnaire    Feeling of Stress : Not at all  Social Connections: Moderately Integrated (07/27/2023)   Social Connection and Isolation Panel    Frequency of Communication with Friends and Family: More than three times a week    Frequency of Social Gatherings with Friends and Family: Twice a week    Attends Religious  Services: More than 4 times per year    Active Member of [employer] or Organizations: Yes    Attends Engineer, structural: More than 4 times per year    Marital Status: Never married  Intimate Partner Violence: Not At Risk (07/27/2023)   Humiliation, Afraid, Rape, and Kick questionnaire    Fear of Current or Ex-Partner: No    Emotionally Abused: No    Physically Abused: No    Sexually Abused: No    FAMILY HISTORY: Family History  Problem Relation Age of Onset   Arthritis Mother    Hypertension Mother    Colon cancer Neg Hx    Esophageal cancer Neg Hx    Pancreatic cancer Neg Hx    Stomach cancer Neg Hx    Liver disease Neg Hx     ALLERGIES:  is allergic to penicillins.  MEDICATIONS:  Current Outpatient Medications  Medication Sig Dispense Refill   baclofen  (LIORESAL ) 20 MG tablet Take 1 tablet (20 mg total) by mouth 4 (four) times daily. 120 tablet 3  Ferrous Sulfate  (IRON ) 325 (65 Fe) MG TABS TAKE 1 TABLET BY MOUTH EVERY OTHER DAY 90 tablet 0   naproxen  (NAPROSYN ) 500 MG tablet Take 1 tablet (500 mg total) by mouth every 6 (six) hours as needed. 60 tablet 2   tadalafil (CIALIS) 5 MG tablet Take 5 mg by mouth daily as needed for erectile dysfunction.     XARELTO  10 MG TABS tablet Take 1 tablet by mouth once daily 90 tablet 0   No current facility-administered medications for this visit.    REVIEW OF SYSTEMS:   Constitutional: ( - ) fevers, ( - )  chills , ( - ) night sweats Eyes: ( - ) blurriness of vision, ( - ) double vision, ( - ) watery eyes Ears, nose, mouth, throat, and face: ( - ) mucositis, ( - ) sore throat Respiratory: ( - ) cough, ( - ) dyspnea, ( - ) wheezes Cardiovascular: ( - ) palpitation, ( - ) chest discomfort, ( + ) lower extremity swelling Gastrointestinal:  ( - ) nausea, ( - ) heartburn, ( - ) change in bowel habits Skin: ( - ) abnormal skin rashes Lymphatics: ( - ) new lymphadenopathy, ( - ) easy bruising Neurological: ( - ) numbness, ( - )  tingling, ( - ) new weaknesses Behavioral/Psych: ( - ) mood change, ( - ) new changes  All other systems were reviewed with the patient and are negative.  PHYSICAL EXAMINATION:  There were no vitals filed for this visit.  There were no vitals filed for this visit.   GENERAL: alert, no distress and comfortable SKIN: skin color, texture, turgor are normal, no rashes or significant lesions EYES: conjunctiva are pink and non-injected, sclera clear LUNGS: clear to auscultation and percussion with normal breathing effort HEART: regular rate & rhythm and no murmurs. B/l lower extremity edema Musculoskeletal: no cyanosis of digits and no clubbing  PSYCH: alert & oriented x 3, fluent speech NEURO: no focal motor/sensory deficits  LABORATORY DATA:  I have reviewed the data as listed    Latest Ref Rng & Units 02/23/2023    3:34 PM 10/22/2022    8:18 AM 02/06/2022   10:40 AM  CBC  WBC 4.0 - 10.5 K/uL 3.8  3.8  4.0   Hemoglobin 13.0 - 17.0 g/dL 85.5  85.9  85.8   Hematocrit 39.0 - 52.0 % 44.3  42.1  41.4   Platelets 150 - 400 K/uL 210  183  179        Latest Ref Rng & Units 02/23/2023    3:34 PM 10/22/2022    8:18 AM 02/06/2022   10:40 AM  CMP  Glucose 70 - 99 mg/dL 99  86  94   BUN 6 - 20 mg/dL 17  22  20    Creatinine 0.61 - 1.24 mg/dL 8.96  8.91  8.96   Sodium 135 - 145 mmol/L 141  143  142   Potassium 3.5 - 5.1 mmol/L 4.0  4.3  4.3   Chloride 98 - 111 mmol/L 107  109  107   CO2 22 - 32 mmol/L 27  27  31    Calcium 8.9 - 10.3 mg/dL 9.1  8.6  9.4   Total Protein 6.5 - 8.1 g/dL  6.6  7.0   Total Bilirubin 0.3 - 1.2 mg/dL  0.5  0.5   Alkaline Phos 38 - 126 U/L  48  50   AST 15 - 41 U/L  24  23  ALT 0 - 44 U/L  17  17     RADIOGRAPHIC STUDIES: No results found.  ASSESSMENT & PLAN Andrew Sullivan is a 53 y.o. male with medical history significant for lower extremity DVT who presents for a follow up visit.After review of the labs, review of the records, and discussion with  the patient the patients findings are most consistent with recurrent unprovoked right lower extremity DVTs.   # Unprovoked Recurrent Right Lower Extremity DVTs --findings at this time are consistent with recurrent unprovoked VTE  --will order baseline CMP and CBC to assure labs are adequate for DOAC therapy   --continue maintenance dose xarelto  10mg  --patient denies any bleeding, bruising, or dark stools on this medication. It is well tolerated. No difficulties accessing/affording the medication  --Labs today show white blood cell count 3.8, hemoglobin 14.0, and platelets 183. Creatinine and LFTs normal. --RTC in 6 months' time with strict return precautions for overt signs of bleeding.      No orders of the defined types were placed in this encounter.   All questions were answered. The patient knows to call the clinic with any problems, questions or concerns.  I have spent a total of 25 minutes minutes of face-to-face and non-face-to-face time, preparing to see the patient, performing a medically appropriate examination, counseling and educating the patient,documenting clinical information in the electronic health record, and care coordination.   Johnston Police PA-C Dept of Hematology and Oncology [facility] Phone: [phone]   10/16/2023 8:01 AM

## 2023-10-22 ENCOUNTER — Other Ambulatory Visit (HOSPITAL_COMMUNITY): Payer: Self-pay

## 2023-10-22 DIAGNOSIS — R310 Gross hematuria: Secondary | ICD-10-CM

## 2023-10-29 ENCOUNTER — Ambulatory Visit (HOSPITAL_COMMUNITY)
Admission: RE | Admit: 2023-10-29 | Discharge: 2023-10-29 | Disposition: A | Discharge status: Home / Self Care | Source: Ambulatory Visit

## 2023-10-29 DIAGNOSIS — R310 Gross hematuria: Secondary | ICD-10-CM | POA: Insufficient documentation

## 2023-10-29 DIAGNOSIS — K820 Obstruction of gallbladder: Secondary | ICD-10-CM | POA: Diagnosis not present

## 2023-10-29 MED ORDER — SODIUM CHLORIDE (PF) 0.9 % IJ SOLN
INTRAMUSCULAR | Status: AC
  Filled 2023-10-29: qty 250

## 2023-10-29 MED ORDER — IOHEXOL 300 MG/ML  SOLN
100.0000 mL | Freq: Once | INTRAMUSCULAR | Status: AC | PRN
  Administered 2023-10-29: 100 mL via INTRAVENOUS

## 2023-10-30 DIAGNOSIS — R609 Edema, unspecified: Secondary | ICD-10-CM | POA: Diagnosis not present

## 2023-10-30 DIAGNOSIS — M62830 Muscle spasm of back: Secondary | ICD-10-CM | POA: Diagnosis not present

## 2023-10-30 DIAGNOSIS — R532 Functional quadriplegia: Secondary | ICD-10-CM | POA: Diagnosis not present

## 2023-10-30 DIAGNOSIS — G8250 Quadriplegia, unspecified: Secondary | ICD-10-CM | POA: Diagnosis not present

## 2023-11-12 DIAGNOSIS — R609 Edema, unspecified: Secondary | ICD-10-CM | POA: Diagnosis not present

## 2023-11-12 DIAGNOSIS — G8250 Quadriplegia, unspecified: Secondary | ICD-10-CM | POA: Diagnosis not present

## 2023-11-24 ENCOUNTER — Other Ambulatory Visit: Payer: Self-pay

## 2023-11-24 DIAGNOSIS — N319 Neuromuscular dysfunction of bladder, unspecified: Secondary | ICD-10-CM | POA: Diagnosis not present

## 2023-11-24 DIAGNOSIS — R310 Gross hematuria: Secondary | ICD-10-CM | POA: Diagnosis not present

## 2023-11-24 DIAGNOSIS — G8250 Quadriplegia, unspecified: Secondary | ICD-10-CM

## 2023-11-24 MED ORDER — BACLOFEN 20 MG PO TABS: 20.0000 mg | ORAL_TABLET | Freq: Four times a day (QID) | ORAL | 3 refills | Status: DC

## 2023-12-14 ENCOUNTER — Other Ambulatory Visit: Payer: Self-pay | Admitting: Hematology and Oncology

## 2023-12-15 ENCOUNTER — Telehealth: Payer: Self-pay | Admitting: Physician Assistant

## 2023-12-15 NOTE — Telephone Encounter (Signed)
 Patient appointments scheduled and reminders were mailed out. Voicemail was unable to be left.

## 2023-12-31 DIAGNOSIS — R532 Functional quadriplegia: Secondary | ICD-10-CM | POA: Diagnosis not present

## 2023-12-31 DIAGNOSIS — G8250 Quadriplegia, unspecified: Secondary | ICD-10-CM | POA: Diagnosis not present

## 2023-12-31 DIAGNOSIS — M62830 Muscle spasm of back: Secondary | ICD-10-CM | POA: Diagnosis not present

## 2023-12-31 DIAGNOSIS — R609 Edema, unspecified: Secondary | ICD-10-CM | POA: Diagnosis not present

## 2024-01-06 ENCOUNTER — Inpatient Hospital Stay: Attending: Hematology and Oncology

## 2024-01-06 ENCOUNTER — Inpatient Hospital Stay: Admitting: Physician Assistant

## 2024-01-11 ENCOUNTER — Encounter: Payer: Self-pay | Admitting: Radiology

## 2024-01-29 DIAGNOSIS — R609 Edema, unspecified: Secondary | ICD-10-CM | POA: Diagnosis not present

## 2024-01-29 DIAGNOSIS — M62830 Muscle spasm of back: Secondary | ICD-10-CM | POA: Diagnosis not present

## 2024-01-29 DIAGNOSIS — R532 Functional quadriplegia: Secondary | ICD-10-CM | POA: Diagnosis not present

## 2024-01-29 DIAGNOSIS — G8250 Quadriplegia, unspecified: Secondary | ICD-10-CM | POA: Diagnosis not present

## 2024-02-09 ENCOUNTER — Other Ambulatory Visit: Payer: Self-pay | Admitting: Family Medicine

## 2024-02-09 DIAGNOSIS — D509 Iron deficiency anemia, unspecified: Secondary | ICD-10-CM

## 2024-03-16 ENCOUNTER — Other Ambulatory Visit: Payer: Self-pay | Admitting: Family Medicine

## 2024-03-16 DIAGNOSIS — G8250 Quadriplegia, unspecified: Secondary | ICD-10-CM

## 2024-03-22 ENCOUNTER — Other Ambulatory Visit: Payer: Self-pay | Admitting: Hematology and Oncology

## 2024-08-04 ENCOUNTER — Encounter
